# Patient Record
Sex: Male | Born: 1935 | Race: White | Hispanic: No | Marital: Single | Smoking: Never smoker
Health system: Southern US, Community
[De-identification: ages and names within clinical notes are randomized; demographics above are authoritative.]

## PROBLEM LIST (undated history)

## (undated) DIAGNOSIS — N289 Disorder of kidney and ureter, unspecified: Secondary | ICD-10-CM

## (undated) DIAGNOSIS — G4700 Insomnia, unspecified: Secondary | ICD-10-CM

## (undated) DIAGNOSIS — R1310 Dysphagia, unspecified: Secondary | ICD-10-CM

## (undated) DIAGNOSIS — H109 Unspecified conjunctivitis: Secondary | ICD-10-CM

## (undated) DIAGNOSIS — S12100A Unspecified displaced fracture of second cervical vertebra, initial encounter for closed fracture: Secondary | ICD-10-CM

## (undated) DIAGNOSIS — R092 Respiratory arrest: Secondary | ICD-10-CM

## (undated) DIAGNOSIS — I10 Essential (primary) hypertension: Secondary | ICD-10-CM

## (undated) DIAGNOSIS — K5900 Constipation, unspecified: Secondary | ICD-10-CM

## (undated) DIAGNOSIS — G8929 Other chronic pain: Secondary | ICD-10-CM

## (undated) DIAGNOSIS — L0231 Cutaneous abscess of buttock: Secondary | ICD-10-CM

## (undated) HISTORY — PX: TRACHEOSTOMY: SUR1362

## (undated) HISTORY — PX: GASTROSTOMY TUBE PLACEMENT: SHX655

---

## 2006-01-21 ENCOUNTER — Ambulatory Visit: Admit: 2006-01-21 | Disposition: A | Discharge status: Home / Self Care | Payer: Self-pay | Source: Ambulatory Visit

## 2009-01-19 ENCOUNTER — Ambulatory Visit: Payer: Self-pay | Admitting: Cardiology

## 2016-07-12 ENCOUNTER — Inpatient Hospital Stay (HOSPITAL_COMMUNITY)
Admission: AD | Admit: 2016-07-12 | Discharge: 2016-07-26 | DRG: 004 | Disposition: A | Discharge status: Other Acute Inpatient Hospital | Payer: Medicare Other | Source: Other Acute Inpatient Hospital | Attending: Internal Medicine | Admitting: Internal Medicine

## 2016-07-12 ENCOUNTER — Inpatient Hospital Stay (HOSPITAL_COMMUNITY): Payer: Medicare Other

## 2016-07-12 DIAGNOSIS — R0602 Shortness of breath: Secondary | ICD-10-CM

## 2016-07-12 DIAGNOSIS — T17998S Other foreign object in respiratory tract, part unspecified causing other injury, sequela: Secondary | ICD-10-CM | POA: Diagnosis not present

## 2016-07-12 DIAGNOSIS — J9601 Acute respiratory failure with hypoxia: Principal | ICD-10-CM

## 2016-07-12 DIAGNOSIS — T17990A Other foreign object in respiratory tract, part unspecified in causing asphyxiation, initial encounter: Secondary | ICD-10-CM | POA: Diagnosis not present

## 2016-07-12 DIAGNOSIS — W0110XA Fall on same level from slipping, tripping and stumbling with subsequent striking against unspecified object, initial encounter: Secondary | ICD-10-CM | POA: Diagnosis present

## 2016-07-12 DIAGNOSIS — Z930 Tracheostomy status: Secondary | ICD-10-CM | POA: Diagnosis not present

## 2016-07-12 DIAGNOSIS — Z7901 Long term (current) use of anticoagulants: Secondary | ICD-10-CM

## 2016-07-12 DIAGNOSIS — S12000A Unspecified displaced fracture of first cervical vertebra, initial encounter for closed fracture: Secondary | ICD-10-CM

## 2016-07-12 DIAGNOSIS — R739 Hyperglycemia, unspecified: Secondary | ICD-10-CM | POA: Diagnosis present

## 2016-07-12 DIAGNOSIS — E876 Hypokalemia: Secondary | ICD-10-CM | POA: Diagnosis not present

## 2016-07-12 DIAGNOSIS — J189 Pneumonia, unspecified organism: Secondary | ICD-10-CM

## 2016-07-12 DIAGNOSIS — Z86718 Personal history of other venous thrombosis and embolism: Secondary | ICD-10-CM

## 2016-07-12 DIAGNOSIS — T17908A Unspecified foreign body in respiratory tract, part unspecified causing other injury, initial encounter: Secondary | ICD-10-CM

## 2016-07-12 DIAGNOSIS — J9819 Other pulmonary collapse: Secondary | ICD-10-CM

## 2016-07-12 DIAGNOSIS — S14121A Central cord syndrome at C1 level of cervical spinal cord, initial encounter: Secondary | ICD-10-CM

## 2016-07-12 DIAGNOSIS — J9600 Acute respiratory failure, unspecified whether with hypoxia or hypercapnia: Secondary | ICD-10-CM | POA: Diagnosis not present

## 2016-07-12 DIAGNOSIS — S12500A Unspecified displaced fracture of sixth cervical vertebra, initial encounter for closed fracture: Secondary | ICD-10-CM

## 2016-07-12 DIAGNOSIS — T17998A Other foreign object in respiratory tract, part unspecified causing other injury, initial encounter: Secondary | ICD-10-CM

## 2016-07-12 DIAGNOSIS — IMO0002 Reserved for concepts with insufficient information to code with codable children: Secondary | ICD-10-CM

## 2016-07-12 DIAGNOSIS — G8250 Quadriplegia, unspecified: Secondary | ICD-10-CM | POA: Diagnosis not present

## 2016-07-12 DIAGNOSIS — Z4659 Encounter for fitting and adjustment of other gastrointestinal appliance and device: Secondary | ICD-10-CM

## 2016-07-12 DIAGNOSIS — E785 Hyperlipidemia, unspecified: Secondary | ICD-10-CM | POA: Diagnosis present

## 2016-07-12 DIAGNOSIS — G9340 Encephalopathy, unspecified: Secondary | ICD-10-CM | POA: Diagnosis not present

## 2016-07-12 DIAGNOSIS — I10 Essential (primary) hypertension: Secondary | ICD-10-CM | POA: Diagnosis present

## 2016-07-12 DIAGNOSIS — J690 Pneumonitis due to inhalation of food and vomit: Secondary | ICD-10-CM | POA: Diagnosis not present

## 2016-07-12 DIAGNOSIS — T17908S Unspecified foreign body in respiratory tract, part unspecified causing other injury, sequela: Secondary | ICD-10-CM

## 2016-07-12 DIAGNOSIS — J9811 Atelectasis: Secondary | ICD-10-CM

## 2016-07-12 DIAGNOSIS — J9690 Respiratory failure, unspecified, unspecified whether with hypoxia or hypercapnia: Secondary | ICD-10-CM

## 2016-07-12 DIAGNOSIS — I481 Persistent atrial fibrillation: Secondary | ICD-10-CM | POA: Diagnosis not present

## 2016-07-12 LAB — MRSA PCR SCREENING: MRSA by PCR: NEGATIVE

## 2016-07-12 LAB — POCT I-STAT 3, ART BLOOD GAS (G3+)
ACID-BASE EXCESS: 12 mmol/L — AB (ref 0.0–2.0)
BICARBONATE: 36.1 meq/L — AB (ref 20.0–24.0)
O2 Saturation: 99 %
PH ART: 7.529 — AB (ref 7.350–7.450)
TCO2: 37 mmol/L (ref 0–100)
pCO2 arterial: 43.3 mmHg (ref 35.0–45.0)
pO2, Arterial: 104 mmHg — ABNORMAL HIGH (ref 80.0–100.0)

## 2016-07-12 LAB — COMPREHENSIVE METABOLIC PANEL
ALBUMIN: 2.6 g/dL — AB (ref 3.5–5.0)
ALK PHOS: 46 U/L (ref 38–126)
ALT: 14 U/L — AB (ref 17–63)
AST: 15 U/L (ref 15–41)
Anion gap: 8 (ref 5–15)
BILIRUBIN TOTAL: 0.6 mg/dL (ref 0.3–1.2)
BUN: 17 mg/dL (ref 6–20)
CO2: 33 mmol/L — ABNORMAL HIGH (ref 22–32)
CREATININE: 0.96 mg/dL (ref 0.61–1.24)
Calcium: 8.7 mg/dL — ABNORMAL LOW (ref 8.9–10.3)
Chloride: 98 mmol/L — ABNORMAL LOW (ref 101–111)
GFR calc Af Amer: >60 mL/min (ref >60)
GLUCOSE: 114 mg/dL — AB (ref 65–99)
Potassium: 3.7 mmol/L (ref 3.5–5.1)
Sodium: 139 mmol/L (ref 135–145)
TOTAL PROTEIN: 5.6 g/dL — AB (ref 6.5–8.1)

## 2016-07-12 LAB — URINALYSIS, ROUTINE W REFLEX MICROSCOPIC
Glucose, UA: NEGATIVE mg/dL
KETONES UR: 15 mg/dL — AB
NITRITE: POSITIVE — AB
PH: 5.5 (ref 5.0–8.0)
Protein, ur: 30 mg/dL — AB
Specific Gravity, Urine: 1.029 (ref 1.005–1.030)

## 2016-07-12 LAB — PROTIME-INR
INR: 2.55
PROTHROMBIN TIME: 27.9 s — AB (ref 11.4–15.2)

## 2016-07-12 LAB — CBC
HEMATOCRIT: 42 % (ref 39.0–52.0)
HEMOGLOBIN: 13.2 g/dL (ref 13.0–17.0)
MCH: 29.9 pg (ref 26.0–34.0)
MCHC: 31.4 g/dL (ref 30.0–36.0)
MCV: 95 fL (ref 78.0–100.0)
Platelets: 293 10*3/uL (ref 150–400)
RBC: 4.42 MIL/uL (ref 4.22–5.81)
RDW: 13.2 % (ref 11.5–15.5)
WBC: 16.9 10*3/uL — ABNORMAL HIGH (ref 4.0–10.5)

## 2016-07-12 LAB — LACTIC ACID, PLASMA: Lactic Acid, Venous: 1.2 mmol/L (ref 0.5–1.9)

## 2016-07-12 LAB — TROPONIN I: Troponin I: 0.06 ng/mL (ref <0.03)

## 2016-07-12 LAB — URINE MICROSCOPIC-ADD ON

## 2016-07-12 LAB — PROCALCITONIN: Procalcitonin: 0.15 ng/mL

## 2016-07-12 LAB — PHOSPHORUS: Phosphorus: <1 mg/dL — CL (ref 2.5–4.6)

## 2016-07-12 LAB — MAGNESIUM: MAGNESIUM: 1.9 mg/dL (ref 1.7–2.4)

## 2016-07-12 LAB — TRIGLYCERIDES: TRIGLYCERIDES: 58 mg/dL (ref <150)

## 2016-07-12 MED ORDER — ALBUTEROL SULFATE (2.5 MG/3ML) 0.083% IN NEBU
2.5000 mg | INHALATION_SOLUTION | RESPIRATORY_TRACT | Status: DC
  Administered 2016-07-12: 2.5 mg via RESPIRATORY_TRACT
  Filled 2016-07-12: qty 3

## 2016-07-12 MED ORDER — PROPOFOL 1000 MG/100ML IV EMUL
INTRAVENOUS | Status: AC
  Filled 2016-07-12: qty 100

## 2016-07-12 MED ORDER — HEPARIN SODIUM (PORCINE) 5000 UNIT/ML IJ SOLN
5000.0000 [IU] | Freq: Three times a day (TID) | INTRAMUSCULAR | Status: DC
  Administered 2016-07-12: 5000 [IU] via SUBCUTANEOUS
  Filled 2016-07-12: qty 1

## 2016-07-12 MED ORDER — POTASSIUM PHOSPHATES 15 MMOLE/5ML IV SOLN
20.0000 meq | Freq: Once | INTRAVENOUS | Status: AC
  Administered 2016-07-12: 20 meq via INTRAVENOUS
  Filled 2016-07-12: qty 4.55

## 2016-07-12 MED ORDER — SODIUM CHLORIDE 0.9 % IV SOLN
3.0000 g | Freq: Four times a day (QID) | INTRAVENOUS | Status: AC
  Administered 2016-07-12 – 2016-07-19 (×27): 3 g via INTRAVENOUS
  Filled 2016-07-12 (×29): qty 3

## 2016-07-12 MED ORDER — ACETYLCYSTEINE 20 % IN SOLN
4.0000 mL | RESPIRATORY_TRACT | Status: DC
  Administered 2016-07-12: 4 mL via RESPIRATORY_TRACT
  Filled 2016-07-12: qty 4

## 2016-07-12 MED ORDER — SODIUM CHLORIDE 0.9 % IV SOLN
INTRAVENOUS | Status: DC
  Administered 2016-07-12: 18:00:00 via INTRAVENOUS
  Administered 2016-07-15 – 2016-07-22 (×3): 10 mL/h via INTRAVENOUS

## 2016-07-12 MED ORDER — FENTANYL CITRATE (PF) 100 MCG/2ML IJ SOLN
50.0000 ug | INTRAMUSCULAR | Status: AC | PRN
  Administered 2016-07-19 (×5): 50 ug via INTRAVENOUS
  Filled 2016-07-12 (×3): qty 2

## 2016-07-12 MED ORDER — ATORVASTATIN CALCIUM 10 MG PO TABS
10.0000 mg | ORAL_TABLET | Freq: Every day | ORAL | Status: DC
Start: 2016-07-13 — End: 2016-07-26
  Administered 2016-07-13 – 2016-07-25 (×14): 10 mg
  Filled 2016-07-12 (×13): qty 1

## 2016-07-12 MED ORDER — HEPARIN SODIUM (PORCINE) 5000 UNIT/ML IJ SOLN
5000.0000 [IU] | Freq: Three times a day (TID) | INTRAMUSCULAR | Status: DC
  Administered 2016-07-12 – 2016-07-15 (×9): 5000 [IU] via SUBCUTANEOUS
  Filled 2016-07-12 (×9): qty 1

## 2016-07-12 MED ORDER — PROPOFOL 1000 MG/100ML IV EMUL
0.0000 ug/kg/min | INTRAVENOUS | Status: DC
  Administered 2016-07-12 – 2016-07-13 (×4): 15 ug/kg/min via INTRAVENOUS
  Administered 2016-07-13 – 2016-07-14 (×3): 30 ug/kg/min via INTRAVENOUS
  Administered 2016-07-14 (×2): 25 ug/kg/min via INTRAVENOUS
  Administered 2016-07-14: 20 ug/kg/min via INTRAVENOUS
  Administered 2016-07-15 – 2016-07-16 (×5): 25 ug/kg/min via INTRAVENOUS
  Administered 2016-07-16 – 2016-07-17 (×2): 30 ug/kg/min via INTRAVENOUS
  Administered 2016-07-17: 10 ug/kg/min via INTRAVENOUS
  Administered 2016-07-17 – 2016-07-18 (×3): 30 ug/kg/min via INTRAVENOUS
  Administered 2016-07-18: 25 ug/kg/min via INTRAVENOUS
  Administered 2016-07-18 – 2016-07-19 (×3): 30 ug/kg/min via INTRAVENOUS
  Administered 2016-07-19 – 2016-07-20 (×3): 25 ug/kg/min via INTRAVENOUS
  Administered 2016-07-21 (×2): 15 ug/kg/min via INTRAVENOUS
  Filled 2016-07-12 (×34): qty 100

## 2016-07-12 MED ORDER — SODIUM CHLORIDE 0.9 % IV SOLN
250.0000 mL | INTRAVENOUS | Status: DC | PRN
  Administered 2016-07-12 – 2016-07-17 (×2): 250 mL via INTRAVENOUS

## 2016-07-12 MED ORDER — FENTANYL CITRATE (PF) 100 MCG/2ML IJ SOLN
50.0000 ug | INTRAMUSCULAR | Status: DC | PRN
  Administered 2016-07-13 – 2016-07-26 (×30): 50 ug via INTRAVENOUS
  Filled 2016-07-12 (×27): qty 2

## 2016-07-12 MED ORDER — ALBUTEROL SULFATE (2.5 MG/3ML) 0.083% IN NEBU
2.5000 mg | INHALATION_SOLUTION | Freq: Four times a day (QID) | RESPIRATORY_TRACT | Status: DC
  Administered 2016-07-13 – 2016-07-26 (×54): 2.5 mg via RESPIRATORY_TRACT
  Filled 2016-07-12 (×55): qty 3

## 2016-07-12 MED ORDER — PANTOPRAZOLE SODIUM 40 MG PO PACK
40.0000 mg | PACK | Freq: Every day | ORAL | Status: DC
  Administered 2016-07-12 – 2016-07-22 (×10): 40 mg
  Filled 2016-07-12 (×12): qty 20

## 2016-07-12 MED ORDER — ASPIRIN 81 MG PO CHEW
81.0000 mg | CHEWABLE_TABLET | Freq: Every day | ORAL | Status: DC
  Administered 2016-07-12: 81 mg via ORAL
  Filled 2016-07-12: qty 1

## 2016-07-12 MED ORDER — CHLORHEXIDINE GLUCONATE 0.12% ORAL RINSE (MEDLINE KIT)
15.0000 mL | Freq: Two times a day (BID) | OROMUCOSAL | Status: DC
  Administered 2016-07-12 – 2016-07-17 (×10): 15 mL via OROMUCOSAL

## 2016-07-12 MED ORDER — POTASSIUM & SODIUM PHOSPHATES 280-160-250 MG PO PACK
1.0000 | PACK | Freq: Three times a day (TID) | ORAL | Status: AC
  Administered 2016-07-12 – 2016-07-14 (×6): 1 via ORAL
  Filled 2016-07-12 (×6): qty 1

## 2016-07-12 MED ORDER — ANTISEPTIC ORAL RINSE SOLUTION (CORINZ)
7.0000 mL | Freq: Four times a day (QID) | OROMUCOSAL | Status: DC
  Administered 2016-07-12 – 2016-07-17 (×19): 7 mL via OROMUCOSAL

## 2016-07-12 NOTE — Progress Notes (Signed)
ANTICOAGULATION CONSULT NOTE - Initial Consult  Pharmacy Consult for heparin while coumadin on hold Indication: History of DVT  Allergies  Allergen Reactions  . Hydromorphone     Respiratory depression    Patient Measurements: Height: 5\' 10"  (177.8 cm) Weight: 254 lb 10.1 oz (115.5 kg) IBW/kg (Calculated) : 73 Heparin Dosing Weight:   Vital Signs: BP: 124/71 (08/03 1800) Pulse Rate: 71 (08/03 1800)  Labs: No results for input(s): HGB, HCT, PLT, APTT, LABPROT, INR, HEPARINUNFRC, HEPRLOWMOCWT, CREATININE, CKTOTAL, CKMB, TROPONINI in the last 72 hours.  CrCl cannot be calculated (No order found.).   Medical History: No past medical history on file.   Assessment: 82 y.o. male with PMH of HTN, HLD, DVT on chronic coumadin. Patient suffered mechanical fall on 8/1 and sustained C1 and C6 fx's. Intubated at OSH. New orders received to use IV heparin for DVT prevention while chronic coumadin is on hold.  INR 2.5 this evening, will hold off on heparin at this time and start once INR is below 2.  Goal of Therapy:  INR 2-3 Heparin level 0.3-0.7 units/ml Monitor platelets by anticoagulation protocol: Yes   Plan:  Start heparin once INR<2  Sheppard Coil PharmD., BCPS Clinical Pharmacist Pager [phone] 07/12/2016 6:25 PM

## 2016-07-12 NOTE — Progress Notes (Signed)
eLink Physician-Brief Progress Note Patient Name: Allen Hamilton DOB: 1933-06-10 MRN: [id number]   Date of Service  07/12/2016  HPI/Events of Note  Troponin elevated - likely demand ischemia  Phos low, K 3.7  eICU Interventions  K phos + sodium phos packet  Asa 12 lead Cycle troponins     Intervention Category Major Interventions: Electrolyte abnormality - evaluation and management Intermediate Interventions: Diagnostic test evaluation  Max Fickle 07/12/2016, 8:49 PM

## 2016-07-12 NOTE — Progress Notes (Signed)
Pharmacy Antibiotic Note  Allen Hamilton is a 82 y.o. male admitted on 07/12/2016 with aspiration pneumonia.  Pharmacy has been consulted for Unasyn dosing.  Plan: Unasyn 3g IV q6 F/U baseline labs & adjust dosing if necessary  Height: 5\' 10"  (177.8 cm) IBW/kg (Calculated) : 73  No data recorded.  No results for input(s): WBC, CREATININE, LATICACIDVEN, VANCOTROUGH, VANCOPEAK, VANCORANDOM, GENTTROUGH, GENTPEAK, GENTRANDOM, TOBRATROUGH, TOBRAPEAK, TOBRARND, AMIKACINPEAK, AMIKACINTROU, AMIKACIN in the last 168 hours.  CrCl cannot be calculated (Unknown ideal weight.).    Allergies not on file  Antimicrobials this admission: Unasyn 8/3 >>   Microbiology results: 8/3 BCx:    Thank you for allowing pharmacy to be a part of this patient's care.  Marisue Humble, PharmD Clinical Pharmacist [facility]

## 2016-07-12 NOTE — Progress Notes (Signed)
CPT held, no secretions noted, pt breath sounds are clear with good aeration

## 2016-07-12 NOTE — Progress Notes (Signed)
Inline suctioned no secretions noted.

## 2016-07-12 NOTE — Consult Note (Signed)
CT imaging reviewed Jefferson type fracture involving both lateral masses of C1, joints intact No role for surgery Aspen collar Follow up in 6 weeks

## 2016-07-12 NOTE — H&P (Signed)
PULMONARY / CRITICAL CARE MEDICINE   Name: Allen Hamilton MRN: [id number] DOB: 1933/07/26    ADMISSION DATE:  07/12/2016 CONSULTATION DATE:  07/12/16  REFERRING MD:  [facility]  CHIEF COMPLAINT:  C1 fracture s/p fall  HISTORY OF PRESENT ILLNESS:  Pt is encephelopathic; therefore, this HPI is obtained from chart review. Allen Hamilton is a 82 y.o. male with PMH of HTN, HLD, DVT on chronic coumadin.  He lives in [place] and on 8/1, was checking on some houses that he was apparently having built and unfortunately suffered a mechanical fall after tripping over a hole causing him to strike his head. On 8/2, he went to [facility] where CT scan revealed a displaced C1 fx involving both arches.  He was placed in a brace and was given pain meds for pain management. After receiving pain meds, he had desaturations and developed respiratory insufficiency.  He was started on BiPAP but later complained of difficulty swallowing.  Due to concern for airway compromise, he was subsequently intubated.  He was then transferred to [facility] ICU on 8/3 for further evaluation and management.  PAST MEDICAL HISTORY :  He  has no past medical history on file.  PAST SURGICAL HISTORY: He  has no past surgical history on file.  Allergies  Allergen Reactions  . Hydromorphone     Respiratory depression    No current facility-administered medications on file prior to encounter.    No current outpatient prescriptions on file prior to encounter.    FAMILY HISTORY:  His has no family status information on file.    SOCIAL HISTORY: He    REVIEW OF SYSTEMS:  Unable to obtain as pt is encephalopathic.  SUBJECTIVE:  On vent, opens eyes but does not follow commands.  VITAL SIGNS: BP 130/67 (BP Location: Right Arm)   Pulse 72   Resp 13   Ht 5\' 10"  (1.778 m)   Wt 254 lb 10.1 oz (115.5 kg)   SpO2 99%   BMI 36.54 kg/m   HEMODYNAMICS:    VENTILATOR SETTINGS: Vent Mode: PRVC FiO2 (%):  [50 %] 50  % Set Rate:  [14 bmp] 14 bmp Vt Set:  [550 mL-580 mL] 580 mL PEEP:  [5 cmH20-10 cmH20] 10 cmH20 Plateau Pressure:  [25 cmH20] 25 cmH20  INTAKE / OUTPUT: No intake/output data recorded.   PHYSICAL EXAMINATION: General: Elderly male, in NAD. Neuro: Sedated, opens eyes but does not follow commands. HEENT: /AT. PERRL, sclerae anicteric. Cervical collar in place. Cardiovascular: RRR, no M/R/G.  Lungs: Respirations even and unlabored.  CTA bilaterally, diminished in bases. Abdomen: BS x 4, soft, NT/ND.  Musculoskeletal: No gross deformities, no edema.  Skin: Intact, warm, no rashes.  Green - white discharge around foley insertion site per RN.  LABS:  BMET No results for input(s): NA, K, CL, CO2, BUN, CREATININE, GLUCOSE in the last 168 hours.  Electrolytes No results for input(s): CALCIUM, MG, PHOS in the last 168 hours.  CBC No results for input(s): WBC, HGB, HCT, PLT in the last 168 hours.  Coag's No results for input(s): APTT, INR in the last 168 hours.  Sepsis Markers No results for input(s): LATICACIDVEN, PROCALCITON, O2SATVEN in the last 168 hours.  ABG No results for input(s): PHART, PCO2ART, PO2ART in the last 168 hours.  Liver Enzymes No results for input(s): AST, ALT, ALKPHOS, BILITOT, ALBUMIN in the last 168 hours.  Cardiac Enzymes No results for input(s): TROPONINI, PROBNP in the last 168 hours.  Glucose No  results for input(s): GLUCAP in the last 168 hours.  Imaging No results found.   STUDIES:  CT head 8/2 > displaced fx of C1 along with fx of C6. CXR 8/3 >  CULTURES: Sputum 8/3 > Gonorrhea 8/3 > Chlamydia 8/3 >  ANTIBIOTICS: Unasyn 8/3 >  SIGNIFICANT EVENTS: 8/2 > admitted at [facility] with C1 and C6 fx, intubated for resp insufficiency. 8/3 > transferred to [facility] ICU.  LINES/TUBES: ETT 8/3 >  DISCUSSION: 82 y.o. M who suffered mechanical fall 8/1 and sustained C1 and C6 fx's.  He required intubation due to concern for airway compromise  8/3 and was later transferred to [facility] for further evaluation and management.  ASSESSMENT / PLAN:  NEUROLOGIC A:   Acute encephalopathy. Displaced C1 and C6 fx's - s/p mechanical fall. P:   Sedation:  Propofol gtt / fentanyl PRN. RASS goal: 0 to -1. Daily WUA. Seen by neurosurgery, no role for surgery.  Continue C collar and follow up in 6 weeks.  PULMONARY A: VDRF - due to concern for airway compromise in the setting of C1 and C6 fx's. Atelectasis. P:   Full vent support. Wean as able. VAP prevention measures. SBT in AM if able - caution given displaced cervical fx's. Mucomyst x 48 hrs. CXR in AM.  CARDIOVASCULAR A:  Hx HTN, HLD, DVT on coumadin. P:  Monitor hemodynamics. Heparin gtt in lieu of outpatient coumadin. Continue outpatient atorvastatin. Hold outpatient lisinopril, niacin, coumadin.  RENAL A:   No acute issues. P:   NS @ 100. BMP in AM.  GASTROINTESTINAL A:   GI prophylaxis. Nutrition. P:   SUP: Pantoprazole. NPO. Start TF's if unable to extubate in AM.  HEMATOLOGIC A:   On chronic coumadin for chronic DVT. VTE Prophylaxis. P:  Hold all anticoayulation with risk spinal cord injury associated CBC in AM.  INFECTIOUS A:   Concern for aspiration PNA. Green / white discharge from around foley insertion site. P:   Abx as above (unasyn).  Follow cultures as above - add gonorrhea, chlamydia, HIV.  ENDOCRINE A:   No acute issues.   P:   Monitor glucose on BMP.  Family updated: None available.  Interdisciplinary Family Meeting v Palliative Care Meeting:  Due by: 07/19/16  CC time: 30 minutes.   Rutherford Guys, [place] [facility] Pager: [phone]  or [phone] 07/12/2016, 6:05 PM   STAFF NOTE: I, Rory Percy, MD FACP have personally reviewed patient's available data, including medical history, events of note, physical examination and test results as part of my evaluation. I have discussed  with resident/NP and other care providers such as pharmacist, RN and RRT. In addition, I personally evaluated patient and elicited key findings of: sedated, collar on, rt lung ronchi but has BS rt, CT reviewed with fx, pcxr I reviewed from disc also showing ATX rll, rml collapse before ETT and pos pressure used, clinical circumstance of neck injury and associated poor resp effort and ATX from mucous plugging, add peep 10, mucomysts, unasyn, chest pt, stat pcxr, stat abg on current settings, NS notified of arrival, will consult, keep collar on strict, keep NPO for now, I looked for family in waiting room , unable to find, pcxr in am , get labs now, hydrate after rhabdo risk and fall, would NOT use systemic anticoagulation with concern spinal injury associated, repeat coags in am  The patient is critically ill with multiple organ systems failure and requires  high complexity decision making for assessment and support, frequent evaluation and titration of therapies, application of advanced monitoring technologies and extensive interpretation of multiple databases.   Critical Care Time devoted to patient care services described in this note is 40 Minutes. This time reflects time of care of this signee: Rory Percy, MD FACP. This critical care time does not reflect procedure time, or teaching time or supervisory time of PA/NP/Med student/Med Resident etc but could involve care discussion time. Rest per NP/medical resident whose note is outlined above and that I agree with   Mcarthur Rossetti. Tyson Alias, MD, FACP Pgr: [phone] [place] [facility] 07/12/2016 9:43 PM

## 2016-07-12 NOTE — Progress Notes (Signed)
 versed from [facility] hospital WIS with Aline August RN

## 2016-07-13 ENCOUNTER — Inpatient Hospital Stay (HOSPITAL_COMMUNITY): Payer: Medicare Other

## 2016-07-13 DIAGNOSIS — J9601 Acute respiratory failure with hypoxia: Secondary | ICD-10-CM | POA: Diagnosis present

## 2016-07-13 DIAGNOSIS — J9600 Acute respiratory failure, unspecified whether with hypoxia or hypercapnia: Secondary | ICD-10-CM

## 2016-07-13 LAB — TROPONIN I

## 2016-07-13 LAB — POCT I-STAT 3, ART BLOOD GAS (G3+)
ACID-BASE EXCESS: 11 mmol/L — AB (ref 0.0–2.0)
BICARBONATE: 35.2 meq/L — AB (ref 20.0–24.0)
O2 SAT: 97 %
TCO2: 37 mmol/L (ref 0–100)
pCO2 arterial: 46.1 mmHg — ABNORMAL HIGH (ref 35.0–45.0)
pH, Arterial: 7.492 — ABNORMAL HIGH (ref 7.350–7.450)
pO2, Arterial: 86 mmHg (ref 80.0–100.0)

## 2016-07-13 LAB — PHOSPHORUS
PHOSPHORUS: 2.6 mg/dL (ref 2.5–4.6)
Phosphorus: 3.1 mg/dL (ref 2.5–4.6)

## 2016-07-13 LAB — BASIC METABOLIC PANEL
ANION GAP: 8 (ref 5–15)
BUN: 18 mg/dL (ref 6–20)
CALCIUM: 8.3 mg/dL — AB (ref 8.9–10.3)
CO2: 32 mmol/L (ref 22–32)
Chloride: 101 mmol/L (ref 101–111)
Creatinine, Ser: 0.94 mg/dL (ref 0.61–1.24)
Glucose, Bld: 115 mg/dL — ABNORMAL HIGH (ref 65–99)
POTASSIUM: 3.7 mmol/L (ref 3.5–5.1)
Sodium: 141 mmol/L (ref 135–145)

## 2016-07-13 LAB — CBC
HCT: 40.3 % (ref 39.0–52.0)
Hemoglobin: 12.9 g/dL — ABNORMAL LOW (ref 13.0–17.0)
MCH: 30.2 pg (ref 26.0–34.0)
MCHC: 32 g/dL (ref 30.0–36.0)
MCV: 94.4 fL (ref 78.0–100.0)
PLATELETS: 276 10*3/uL (ref 150–400)
RBC: 4.27 MIL/uL (ref 4.22–5.81)
RDW: 13.6 % (ref 11.5–15.5)
WBC: 14.4 10*3/uL — AB (ref 4.0–10.5)

## 2016-07-13 LAB — PROTIME-INR
INR: 1.97
Prothrombin Time: 22.7 seconds — ABNORMAL HIGH (ref 11.4–15.2)

## 2016-07-13 LAB — HIV ANTIBODY (ROUTINE TESTING W REFLEX): HIV SCREEN 4TH GENERATION: NONREACTIVE

## 2016-07-13 LAB — GLUCOSE, CAPILLARY
GLUCOSE-CAPILLARY: 101 mg/dL — AB (ref 65–99)
GLUCOSE-CAPILLARY: 108 mg/dL — AB (ref 65–99)
Glucose-Capillary: 98 mg/dL (ref 65–99)

## 2016-07-13 LAB — MAGNESIUM
MAGNESIUM: 1.8 mg/dL (ref 1.7–2.4)
Magnesium: 1.8 mg/dL (ref 1.7–2.4)

## 2016-07-13 MED ORDER — PRO-STAT SUGAR FREE PO LIQD
60.0000 mL | Freq: Three times a day (TID) | ORAL | Status: DC
  Administered 2016-07-13 – 2016-07-24 (×31): 60 mL
  Filled 2016-07-13 (×32): qty 60

## 2016-07-13 MED ORDER — ACETYLCYSTEINE 10 % IN SOLN
4.0000 mL | Freq: Two times a day (BID) | RESPIRATORY_TRACT | Status: DC
  Filled 2016-07-13: qty 4

## 2016-07-13 MED ORDER — PRO-STAT SUGAR FREE PO LIQD
30.0000 mL | Freq: Two times a day (BID) | ORAL | Status: DC
  Administered 2016-07-13: 30 mL
  Filled 2016-07-13: qty 30

## 2016-07-13 MED ORDER — VITAL HIGH PROTEIN PO LIQD
1000.0000 mL | ORAL | Status: DC
  Administered 2016-07-13: 1000 mL
  Administered 2016-07-14: 20:00:00
  Administered 2016-07-14: 1000 mL
  Administered 2016-07-15 (×2)
  Administered 2016-07-15: 1000 mL
  Administered 2016-07-16: 12:00:00
  Administered 2016-07-16: 1000 mL
  Administered 2016-07-16 – 2016-07-17 (×2)
  Administered 2016-07-17: 1000 mL
  Administered 2016-07-18
  Administered 2016-07-18: 1000 mL
  Administered 2016-07-19
  Administered 2016-07-19 (×2): 1000 mL
  Administered 2016-07-20 (×2)
  Administered 2016-07-20: 1000 mL
  Administered 2016-07-20 (×2)
  Administered 2016-07-21 – 2016-07-23 (×3): 1000 mL
  Administered 2016-07-23: 09:00:00
  Administered 2016-07-23: 1000 mL
  Administered 2016-07-24 (×6)
  Administered 2016-07-24: 1000 mL
  Administered 2016-07-24: 11:00:00

## 2016-07-13 MED ORDER — ACETYLCYSTEINE 20 % IN SOLN
4.0000 mL | Freq: Two times a day (BID) | RESPIRATORY_TRACT | Status: AC
  Administered 2016-07-13 (×2): 4 mL via RESPIRATORY_TRACT
  Filled 2016-07-13 (×2): qty 4

## 2016-07-13 MED ORDER — VITAL HIGH PROTEIN PO LIQD
1000.0000 mL | ORAL | Status: DC
  Administered 2016-07-13: 1000 mL

## 2016-07-13 MED ORDER — INSULIN ASPART 100 UNIT/ML ~~LOC~~ SOLN
0.0000 [IU] | SUBCUTANEOUS | Status: DC
  Administered 2016-07-19 (×2): 1 [IU] via SUBCUTANEOUS
  Administered 2016-07-21: 2 [IU] via SUBCUTANEOUS
  Administered 2016-07-21: 1 [IU] via SUBCUTANEOUS
  Administered 2016-07-21: 2 [IU] via SUBCUTANEOUS
  Administered 2016-07-21 – 2016-07-23 (×5): 1 [IU] via SUBCUTANEOUS
  Administered 2016-07-23: 2 [IU] via SUBCUTANEOUS
  Administered 2016-07-23 – 2016-07-25 (×6): 1 [IU] via SUBCUTANEOUS
  Administered 2016-07-26: 2 [IU] via SUBCUTANEOUS
  Administered 2016-07-26: 1 [IU] via SUBCUTANEOUS

## 2016-07-13 NOTE — Progress Notes (Signed)
Initial Nutrition Assessment  DOCUMENTATION CODES:   Obesity unspecified  INTERVENTION:    Initiate TF via OGT with Vital High Protein at goal rate of 30 ml/h (720 ml per day) and Prostat 60 ml TID to provide 1320 kcals, 153 gm protein, 602 ml free water daily.  Total calorie intake with TF + Propofol will be 1595 kcal per day (100% estimated needs).  NUTRITION DIAGNOSIS:   Inadequate oral intake related to inability to eat as evidenced by NPO status.  GOAL:   Provide needs based on ASPEN/SCCM guidelines  MONITOR:   Vent status, Labs, Weight trends, TF tolerance, Skin, I & O's  REASON FOR ASSESSMENT:   Consult Enteral/tube feeding initiation and management  ASSESSMENT:   82 y.o. M who suffered mechanical fall 8/1 and sustained C1 and C6 fx's.  He required intubation due to concern for airway compromise 8/3 and was later transferred to [facility] for further evaluation and management.  Labs reviewed. Medications reviewed and include Novolog, potassium & sodium phosphates Nutrition-Focused physical exam completed. Findings are no fat depletion, no muscle depletion, and no edema.  Patient is currently intubated on ventilator support Temp (24hrs), Avg:98.9 F (37.2 C), Min:98.4 F (36.9 C), Max:99.1 F (37.3 C)  Propofol: 10.4 ml/hr providing 275 kcal Received MD Consult for TF initiation and management.  Diet Order:  Diet NPO time specified  Skin:  Reviewed, no issues  Last BM:  PTA  Height:   Ht Readings from Last 1 Encounters:  07/12/16 5\' 10"  (1.778 m)    Weight:   Wt Readings from Last 1 Encounters:  07/13/16 251 lb 12.3 oz (114.2 kg)    Ideal Body Weight:  75.5 kg  BMI:  Body mass index is 36.12 kg/m.  Estimated Nutritional Needs:   Kcal:  3893-7342  Protein:  >150 gm  Fluid:  2 L  EDUCATION NEEDS:   No education needs identified at this time  Joaquin Courts, RD, LDN, CNSC Pager [phone] After Hours Pager [phone]

## 2016-07-13 NOTE — Progress Notes (Signed)
ABG collected  

## 2016-07-13 NOTE — Progress Notes (Signed)
Pharmacy Antibiotic Note  Allen Hamilton is a 82 y.o. male admitted on 07/12/2016 with aspiration pneumonia.  Pharmacy has been consulted for Unasyn dosing. Scr 0.94. CrCl 79 ok.  Plan: Unasyn 3g IV q6 Pharmacy will sign off. Please reconsult for further dosing assitance.   Height: 5\' 10"  (177.8 cm) Weight: 251 lb 12.3 oz (114.2 kg) IBW/kg (Calculated) : 73  Temp (24hrs), Avg:98.9 F (37.2 C), Min:98.4 F (36.9 C), Max:99.1 F (37.3 C)   Recent Labs Lab 07/12/16 1841 07/12/16 1854 07/13/16 0725  WBC 16.9*  --  14.4*  CREATININE 0.96  --  0.94  LATICACIDVEN  --  1.2  --     Estimated Creatinine Clearance: 79.3 mL/min (by C-G formula based on SCr of 0.94 mg/dL).    Allergies  Allergen Reactions  . Hydromorphone     Respiratory depression    Antimicrobials this admission: Unasyn 8/3 >>   Microbiology results: 8/3 BCx:  8/3: Nasal MRSA PCR: negative 8/3: Gonoccus>> 8/3: Trach aspirate: GPC, GNR 8/3 BC x 2>>   Hall Birchard S. Merilynn Finland, PharmD, BCPS Clinical Staff Pharmacist Pager [phone]

## 2016-07-13 NOTE — Progress Notes (Signed)
RN called d/t pt w/ increased RR and increased WOB.  RN gave bolus of Propofol, pt switched back to full vent support currently.  NO distress currently noted, RN aware.

## 2016-07-13 NOTE — Progress Notes (Signed)
CPT at this time tolerating it well.

## 2016-07-13 NOTE — Progress Notes (Signed)
CPT held, pt is sleeping comfortably at this time no distress or complications noted

## 2016-07-13 NOTE — Progress Notes (Signed)
Scant amount of Clear secretions noted

## 2016-07-13 NOTE — H&P (Signed)
PULMONARY / CRITICAL CARE MEDICINE   Name: Allen Hamilton MRN: [id number] DOB: 12/17/1932    ADMISSION DATE:  07/12/2016 CONSULTATION DATE:  07/12/16  REFERRING MD:  [facility]  CHIEF COMPLAINT:  C1 fracture s/p fall  HISTORY OF PRESENT ILLNESS:  Pt is encephelopathic; therefore, this HPI is obtained from chart review. Allen Hamilton is a 82 y.o. male with PMH of HTN, HLD, DVT on chronic coumadin.  He lives in [place] and on 8/1, was checking on some houses that he was apparently having built and unfortunately suffered a mechanical fall after tripping over a hole causing him to strike his head. On 8/2, he went to [facility] where CT scan revealed a displaced C1 fx involving both arches.  He was placed in a brace and was given pain meds for pain management. After receiving pain meds, he had desaturations and developed respiratory insufficiency.  He was started on BiPAP but later complained of difficulty swallowing.  Due to concern for airway compromise, he was subsequently intubated.  He was then transferred to [facility] ICU on 8/3 for further evaluation and management.  SUBJECTIVE:  NS evaluation, no role surgery Peep remains 10  VITAL SIGNS: BP (!) 122/59   Pulse 79   Temp 99.1 F (37.3 C) (Oral)   Resp 14   Ht  (1.778 m)   Wt 114.2 kg (251 lb 12.3 oz)   SpO2 100%   BMI 36.12 kg/m   HEMODYNAMICS:    VENTILATOR SETTINGS: Vent Mode: PRVC FiO2 (%):  [40 %-50 %] 40 % Set Rate:  [14 bmp] 14 bmp Vt Set:  [510 mL-580 mL] 510 mL PEEP:  [5 cmH20-10 cmH20] 10 cmH20 Plateau Pressure:  [25 cmH20-26 cmH20] 25 cmH20  INTAKE / OUTPUT: I/O last 3 completed shifts: In: 1909.4 [I.V.:1394.8; NG/GT:60; IV Piggyback:454.6] Out: 200 [Urine:200]   PHYSICAL EXAMINATION: General: Elderly male, in NAD. Neuro: Sedated, opens eyes, follows commands, moves all ext HEENT: [place]/AT. PERRL, Cervical collar in place. Cardiovascular: RRR, no M/R/G.  Lungs: Improved ronchi Abdomen: BS  x 4, soft, NT/ND.  Musculoskeletal: No gross deformities, no edema.  Skin: Intact, warm, no rashes  LABS:  BMET  Recent Labs Lab 07/12/16 1841  NA 139  K 3.7  CL 98*  CO2 33*  BUN 17  CREATININE 0.96  GLUCOSE 114*    Electrolytes  Recent Labs Lab 07/12/16 1841  CALCIUM 8.7*  MG 1.9  PHOS <1.0*    CBC  Recent Labs Lab 07/12/16 1841  WBC 16.9*  HGB 13.2  HCT 42.0  PLT 293    Coag's  Recent Labs Lab 07/12/16 1850  INR 2.55    Sepsis Markers  Recent Labs Lab 07/12/16 1841 07/12/16 1854  LATICACIDVEN  --  1.2  PROCALCITON 0.15  --     ABG  Recent Labs Lab 07/12/16 1828 07/13/16 0326  PHART 7.529* 7.492*  PCO2ART 43.3 46.1*  PO2ART 104.0* 86.0    Liver Enzymes  Recent Labs Lab 07/12/16 1841  AST 15  ALT 14*  ALKPHOS 46  BILITOT 0.6  ALBUMIN 2.6*    Cardiac Enzymes  Recent Labs Lab 07/12/16 1841 07/12/16 2305  TROPONINI 0.06* <0.03    Glucose No results for input(s): GLUCAP in the last 168 hours.  Imaging Dg Chest Port 1 View  Result Date: 07/13/2016 CLINICAL DATA:  Respiratory failure. EXAM: PORTABLE CHEST 1 VIEW COMPARISON:  Radiograph of July 12, 2016. FINDINGS: Endotracheal and nasogastric tubes are unchanged in position. No pneumothorax  is noted. Stable small bilateral pleural effusions are noted. Stable left basilar atelectasis noted. Stable right lung opacity is noted concerning for atelectasis. Bony thorax is unremarkable. IMPRESSION: Stable support apparatus. Stable bilateral lung opacities and pleural effusions. Electronically Signed   By: Lupita Raider, M.D.   On: 07/13/2016 07:34   Dg Chest Port 1 View  Result Date: 07/12/2016 CLINICAL DATA:  82 year old male with aspiration. Intubated today. Initial encounter. EXAM: PORTABLE CHEST 1 VIEW COMPARISON:  0828 hours today and earlier. FINDINGS: Portable AP semi upright view at 2219 hours. Endotracheal tube tip in good position between the level the clavicles and  carina. Enteric tube has been placed and courses to the abdomen, tip not included. Improved right lung base ventilation with residual Patchy and confluent opacity and suspected small bilateral pleural effusions. No areas of worsening ventilation. No pneumothorax. No pulmonary edema. Stable cardiomegaly and mediastinal contours. Calcified aortic atherosclerosis. IMPRESSION: 1. ET tube in good position. Enteric tube has been placed coursing into the abdomen, tip not included. 2. Improved right lung base ventilation with patchy residual opacity. Small bilateral pleural effusions. 3. No new cardiopulmonary abnormality. 4. Calcified aortic atherosclerosis. Electronically Signed   By: Odessa Fleming M.D.   On: 07/12/2016 22:39     STUDIES:  CT head 8/2 > displaced fx of C1 along with fx of C6.  CULTURES: Sputum 8/3 > Gonorrhea 8/3 > Chlamydia 8/3 >  ANTIBIOTICS: Unasyn 8/3 >  SIGNIFICANT EVENTS: 8/2 > admitted at [facility] with C1 and C6 fx, intubated for resp insufficiency. 8/3 > transferred to [facility] ICU.  LINES/TUBES: ETT 8/3 >  DISCUSSION: 82 y.o. M who suffered mechanical fall 8/1 and sustained C1 and C6 fx's.  He required intubation due to concern for airway compromise 8/3 and was later transferred to [facility] for further evaluation and management.  ASSESSMENT / PLAN:  NEUROLOGIC A:   Acute encephalopathy. Displaced C1 and C6 fx's - s/p mechanical fall. P:   Sedation:  Propofol gtt / fentanyl PRN RASS goal: 0  WUA. Seen by neurosurgery, no role for surgery.  Continue C collar and follow up in 6 weeks.  PULMONARY A: VDRF - due to concern for airway compromise in the setting of C1 and C6 fx's. Atelectasis, collapse resolved rt / asp P:   Allow mucomysts to dc in 2 more doses pcxr is improved, reduce peep slowly to 5 ABG reviewed, reduced to 7 cc/kg for that Once peep to 5, will sbt  Will need to discuss reintubation wishes ABX  CARDIOVASCULAR A:  Hx HTN, HLD, DVT on coumadin. P:   Monitor hemodynamics. Continue outpatient atorvastatin. Hold outpatient lisinopril, niacin, coumadin Held anticoagualation for cervical fx, will re assess when to restart with NS Dc further trop  RENAL A:   No acute issues. P:   NS @ 100, consider kvo BMP in AM.  GASTROINTESTINAL A:   GI prophylaxis. Nutrition. P:   SUP: Pantoprazole. NPO. Start TF  HEMATOLOGIC A:   On chronic coumadin for chronic DVT. VTE Prophylaxis. P:  Hold all anticoayulation with risk spinal cord injury associated CBC in AM.  INFECTIOUS A:   Concern for aspiration PNA. Green / white discharge from around foley insertion site. P:   Abx as above (unasyn).  Follow cultures as above - pending gonorrhea, chlamydia, HIV.  ENDOCRINE A:   At risk hyperglycemia   P:   Add ssi  Family updated: None available.  Interdisciplinary Family Meeting v Palliative Care Meeting:  Due by:  07/19/16  CC time: 30 minutes.  Mcarthur Rossetti. Tyson Alias, MD, FACP Pgr: [phone] [place] [facility] 07/13/2016 8:00 AM

## 2016-07-14 ENCOUNTER — Inpatient Hospital Stay (HOSPITAL_COMMUNITY): Payer: Medicare Other

## 2016-07-14 LAB — CBC WITH DIFFERENTIAL/PLATELET
BASOS ABS: 0 10*3/uL (ref 0.0–0.1)
BASOS PCT: 0 %
EOS ABS: 0.2 10*3/uL (ref 0.0–0.7)
EOS PCT: 1 %
HCT: 39.2 % (ref 39.0–52.0)
Hemoglobin: 12.1 g/dL — ABNORMAL LOW (ref 13.0–17.0)
Lymphocytes Relative: 7 %
Lymphs Abs: 0.8 10*3/uL (ref 0.7–4.0)
MCH: 29.7 pg (ref 26.0–34.0)
MCHC: 30.9 g/dL (ref 30.0–36.0)
MCV: 96.1 fL (ref 78.0–100.0)
MONO ABS: 1 10*3/uL (ref 0.1–1.0)
Monocytes Relative: 8 %
NEUTROS ABS: 9.7 10*3/uL — AB (ref 1.7–7.7)
Neutrophils Relative %: 84 %
PLATELETS: 269 10*3/uL (ref 150–400)
RBC: 4.08 MIL/uL — ABNORMAL LOW (ref 4.22–5.81)
RDW: 14.1 % (ref 11.5–15.5)
WBC: 11.7 10*3/uL — ABNORMAL HIGH (ref 4.0–10.5)

## 2016-07-14 LAB — MAGNESIUM
MAGNESIUM: 1.8 mg/dL (ref 1.7–2.4)
MAGNESIUM: 2 mg/dL (ref 1.7–2.4)

## 2016-07-14 LAB — PROTIME-INR
INR: 1.52
PROTHROMBIN TIME: 18.5 s — AB (ref 11.4–15.2)

## 2016-07-14 LAB — PHOSPHORUS
PHOSPHORUS: 3.2 mg/dL (ref 2.5–4.6)
Phosphorus: 3.8 mg/dL (ref 2.5–4.6)

## 2016-07-14 LAB — GLUCOSE, CAPILLARY
GLUCOSE-CAPILLARY: 114 mg/dL — AB (ref 65–99)
GLUCOSE-CAPILLARY: 102 mg/dL — AB (ref 65–99)
Glucose-Capillary: 113 mg/dL — ABNORMAL HIGH (ref 65–99)
Glucose-Capillary: 106 mg/dL — ABNORMAL HIGH (ref 65–99)
Glucose-Capillary: 106 mg/dL — ABNORMAL HIGH (ref 65–99)
Glucose-Capillary: 92 mg/dL (ref 65–99)
Glucose-Capillary: 112 mg/dL — ABNORMAL HIGH (ref 65–99)

## 2016-07-14 LAB — BASIC METABOLIC PANEL
ANION GAP: 12 (ref 5–15)
BUN: 23 mg/dL — AB (ref 6–20)
CALCIUM: 8.3 mg/dL — AB (ref 8.9–10.3)
CO2: 29 mmol/L (ref 22–32)
CREATININE: 0.7 mg/dL (ref 0.61–1.24)
Chloride: 101 mmol/L (ref 101–111)
GFR calc Af Amer: >60 mL/min (ref >60)
GLUCOSE: 104 mg/dL — AB (ref 65–99)
Potassium: 3.7 mmol/L (ref 3.5–5.1)
Sodium: 142 mmol/L (ref 135–145)

## 2016-07-14 NOTE — Progress Notes (Signed)
PULMONARY / CRITICAL CARE MEDICINE   Name: Allen Hamilton MRN: [id number] DOB: [birth year]    ADMISSION DATE:  07/12/2016 CONSULTATION DATE:  07/12/16  REFERRING MD:  [facility]  CHIEF COMPLAINT:  C1 fracture s/p fall  BRIEF: 82 y/o male with HTN, HLD and DVT on coumadin here with a C1 fracture post fall, intubated but neurologically intact, has RLL aspiration pneumonia and mucus plugging.   SUBJECTIVE:  CXR worse this morning Has not weaned No acute events  VITAL SIGNS: BP 122/62   Pulse 63   Temp 98.9 F (37.2 C) (Axillary)   Resp 14   Ht 5\' 10"  (1.778 m)   Wt 115.2 kg (253 lb 15.5 oz)   SpO2 99%   BMI 36.44 kg/m   HEMODYNAMICS:    VENTILATOR SETTINGS: Vent Mode: PRVC FiO2 (%):  [40 %] 40 % Set Rate:  [14 bmp] 14 bmp Vt Set:  [510 mL] 510 mL PEEP:  [5 cmH20] 5 cmH20 Pressure Support:  [15 cmH20] 15 cmH20 Plateau Pressure:  [20 cmH20-27 cmH20] 24 cmH20  INTAKE / OUTPUT: I/O last 3 completed shifts: In: 3586.4 [I.V.:2091.8; NG/GT:640; IV Piggyback:854.6] Out: 1260 [Urine:1260]   PHYSICAL EXAMINATION: General: sedated on vent HENT: NCAT hard collar and ETT in place PULM: RLL collapse, otherwise clear CV: RRR, no mgr GI: BS+, soft, nontender Neuro: sedated on vent Derm: no rash or skin breakdown  LABS:  BMET  Recent Labs Lab 07/12/16 1841 07/13/16 0725 07/14/16 0539  NA 139 141 142  K 3.7 3.7 3.7  CL 98* 101 101  CO2 33* 32 29  BUN 17 18 23*  CREATININE 0.96 0.94 0.70  GLUCOSE 114* 115* 104*    Electrolytes  Recent Labs Lab 07/12/16 1841 07/13/16 0725 07/13/16 1623 07/14/16 0539  CALCIUM 8.7* 8.3*  --  8.3*  MG 1.9 1.8 1.8 1.8  PHOS <1.0* 2.6 3.1 3.2    CBC  Recent Labs Lab 07/12/16 1841 07/13/16 0725 07/14/16 0539  WBC 16.9* 14.4* 11.7*  HGB 13.2 12.9* 12.1*  HCT 42.0 40.3 39.2  PLT 293 276 269    Coag's  Recent Labs Lab 07/12/16 1850 07/13/16 0725 07/14/16 0539  INR 2.55 1.97 1.52    Sepsis  Markers  Recent Labs Lab 07/12/16 1841 07/12/16 1854  LATICACIDVEN  --  1.2  PROCALCITON 0.15  --     ABG  Recent Labs Lab 07/12/16 1828 07/13/16 0326  PHART 7.529* 7.492*  PCO2ART 43.3 46.1*  PO2ART 104.0* 86.0    Liver Enzymes  Recent Labs Lab 07/12/16 1841  AST 15  ALT 14*  ALKPHOS 46  BILITOT 0.6  ALBUMIN 2.6*    Cardiac Enzymes  Recent Labs Lab 07/12/16 1841 07/12/16 2305 07/13/16 0725  TROPONINI 0.06* <0.03 <0.03    Glucose  Recent Labs Lab 07/13/16 0851 07/13/16 1224 07/13/16 1643 07/13/16 2321 07/14/16 0418  GLUCAP 98 101* 108* 113* 106*    Imaging No results found.   STUDIES:  CT head 8/2 > displaced fx of C1 along with fx of C6  CULTURES: Sputum 8/3 > few GNR Gonorrhea 8/3 > Chlamydia 8/3 >  ANTIBIOTICS: Unasyn 8/3 >  SIGNIFICANT EVENTS: 8/2 > admitted at [facility] with C1 and C6 fx, intubated for resp insufficiency 8/3 > transferred to [facility] ICU  LINES/TUBES: ETT 8/3 >  DISCUSSION: 82 y.o. M who suffered mechanical fall 8/1 and sustained C1 and C6 fx's.  He required intubation due to concern for airway compromise 8/3 and was later transferred  to [facility] for further evaluation and management.  ASSESSMENT / PLAN:  NEUROLOGIC A:   Acute encephalopathy Displaced C1 and C6 fx's - s/p mechanical fall Seen by neurosurgery, no role for surgery.   P:   Sedation:  Propofol gtt / fentanyl PRN RASS goal: 0  WUA daily Continue C collar and follow up in 6 weeks  PULMONARY A: Acute respiratory failure with hypoxemia Aspiration pneumonia RLL Collapse/atelectasis RLL > worse today Atelectasis, collapse resolved rt / asp P:   Bag lavaage with saline today q shfit Repeat CXR in AM PSV as tolerated today If no improvement in CXR findings or if weaning poorly then consider bronchoscopy 8/6 VAP prevention  CARDIOVASCULAR A:  Hx HTN, HLD, DVT on coumadin P:  Monitor hemodynamics Continue outpatient atorvastatin Hold outpatient  lisinopril, niacin, coumadin Continue to hold anticoagulation, will discuss timing of restart with neurosurgery  RENAL A:   No acute issues P:   KVO fluids Monitor BMET and UOP Replace electrolytes as needed   GASTROINTESTINAL A:   GI prophylaxis Nutrition P:   SUP: Pantoprazole Continue TF  HEMATOLOGIC A:   On chronic coumadin for chronic DVT VTE Prophylaxis P:  Hold all anticoayulation with risk spinal cord injury, discuss with neurosurgery CBC in AM  INFECTIOUS A:   Aspiration pneumonia Penile discharge P:   Abx as above (unasyn) Follow cultures  ENDOCRINE A:   At risk hyperglycemia   P:   Add ssi  Family updated: None available 8/5  Interdisciplinary Family Meeting v Palliative Care Meeting:  Due by: 07/19/16  CC time: 31 minutes.  Heber Cushing, MD [place] [facility] Pager: [phone] Cell: [phone] After 3pm or if no response, call [phone]  07/14/2016 7:39 AM

## 2016-07-14 NOTE — Progress Notes (Signed)
CPT held, pt is sleeping comfortably at this time no distress or complications noted 

## 2016-07-15 ENCOUNTER — Inpatient Hospital Stay (HOSPITAL_COMMUNITY): Payer: Medicare Other

## 2016-07-15 LAB — PROTIME-INR
INR: 1.25
PROTHROMBIN TIME: 15.7 s — AB (ref 11.4–15.2)

## 2016-07-15 LAB — GONOCOCCUS CULTURE

## 2016-07-15 LAB — GLUCOSE, CAPILLARY
GLUCOSE-CAPILLARY: 104 mg/dL — AB (ref 65–99)
GLUCOSE-CAPILLARY: 113 mg/dL — AB (ref 65–99)
Glucose-Capillary: 103 mg/dL — ABNORMAL HIGH (ref 65–99)
Glucose-Capillary: 106 mg/dL — ABNORMAL HIGH (ref 65–99)
Glucose-Capillary: 97 mg/dL (ref 65–99)

## 2016-07-15 LAB — BASIC METABOLIC PANEL
ANION GAP: 6 (ref 5–15)
BUN: 22 mg/dL — ABNORMAL HIGH (ref 6–20)
CALCIUM: 8.3 mg/dL — AB (ref 8.9–10.3)
CO2: 33 mmol/L — ABNORMAL HIGH (ref 22–32)
CREATININE: 0.75 mg/dL (ref 0.61–1.24)
Chloride: 101 mmol/L (ref 101–111)
Glucose, Bld: 125 mg/dL — ABNORMAL HIGH (ref 65–99)
Potassium: 4.7 mmol/L (ref 3.5–5.1)
SODIUM: 140 mmol/L (ref 135–145)

## 2016-07-15 LAB — TRIGLYCERIDES: TRIGLYCERIDES: 89 mg/dL (ref <150)

## 2016-07-15 LAB — HEPARIN LEVEL (UNFRACTIONATED): HEPARIN UNFRACTIONATED: 0.3 [IU]/mL (ref 0.30–0.70)

## 2016-07-15 MED ORDER — HEPARIN (PORCINE) IN NACL 100-0.45 UNIT/ML-% IJ SOLN
1750.0000 [IU]/h | INTRAMUSCULAR | Status: DC
  Administered 2016-07-15: 1650 [IU]/h via INTRAVENOUS
  Administered 2016-07-16 – 2016-07-17 (×3): 1750 [IU]/h via INTRAVENOUS
  Filled 2016-07-15 (×4): qty 250

## 2016-07-15 MED ORDER — WARFARIN - PHARMACIST DOSING INPATIENT
Freq: Every day | Status: DC
  Administered 2016-07-15: 1
  Administered 2016-07-17: 18:00:00

## 2016-07-15 MED ORDER — FUROSEMIDE 10 MG/ML IJ SOLN
40.0000 mg | Freq: Four times a day (QID) | INTRAMUSCULAR | Status: AC
  Administered 2016-07-15 (×2): 40 mg via INTRAVENOUS
  Filled 2016-07-15 (×2): qty 4

## 2016-07-15 MED ORDER — WARFARIN SODIUM 5 MG PO TABS
10.0000 mg | ORAL_TABLET | Freq: Once | ORAL | Status: AC
  Administered 2016-07-15: 10 mg via ORAL
  Filled 2016-07-15: qty 2

## 2016-07-15 NOTE — Progress Notes (Signed)
ANTICOAGULATION CONSULT NOTE - f/u Consult  Pharmacy Consult for Warfarin/Heparin. Indication: Chronic warfarin PTA for prior DVT.  Allergies  Allergen Reactions  . Hydromorphone     Respiratory depression    Patient Measurements: Height: 5\' 10"  (177.8 cm) Weight: 255 lb 8.2 oz (115.9 kg) IBW/kg (Calculated) : 73 Heparin Dosing Weight: 98.6  Vital Signs: Temp: 98.5 F (36.9 C) (08/06 2000) Temp Source: Oral (08/06 2000) BP: 144/72 (08/06 2000) Pulse Rate: 94 (08/06 2000)  Labs:  Recent Labs  07/12/16 2305 07/13/16 0725 07/14/16 0539 07/15/16 0831 07/15/16 2035  HGB  --  12.9* 12.1*  --   --   HCT  --  40.3 39.2  --   --   PLT  --  276 269  --   --   LABPROT  --  22.7* 18.5* 15.7*  --   INR  --  1.97 1.52 1.25  --   HEPARINUNFRC  --   --   --   --  0.30  CREATININE  --  0.94 0.70 0.75  --   TROPONINI <0.03 <0.03  --   --   --     Estimated Creatinine Clearance: 94 mL/min (by C-G formula based on SCr of 0.8 mg/dL).   Assessment: Hx DVTs on chronic coumadin PTA, held warf for cervical fx and started on heparin 5000 [place] TID. Per neurosurg okay to start anticoagulation, rx consulted for warfarin/UFH on 8/6. INR 2.55 on admit, now down to 1.25, hgb/plt wnl. Last heparin [place] dose was 8/6 1350 so no bolus, will d/c [place] and start gtt. --First HL 0.3 in low goal range.  **PTA warfarin dose: 8 mg Mon/Thurs, 9 mg ROW  Goal of Therapy:  INR 2-3 Monitor platelets by anticoagulation protocol: Yes    Plan:  -Initiate heparin 1750 units/hr -Daily INR, HL, CBC, S/Sx bleeding  Bennett Vanscyoc S. Merilynn Finlandobertson, PharmD, [facility] Clinical Staff Pharmacist Pager [phone]  07/15/2016

## 2016-07-15 NOTE — Progress Notes (Signed)
Per CCM MD order, saline pushed down ett and pt bag lavaged x1 minute. Pt stable throughout with no complications. Very little white/yellow/tan thick secretions obtained. RN aware. RT will continue to monitor.

## 2016-07-15 NOTE — Progress Notes (Signed)
PULMONARY / CRITICAL CARE MEDICINE   Name: Allen Hamilton MRN: [id number] DOB: [birth year]    ADMISSION DATE:  07/12/2016 CONSULTATION DATE:  07/12/16  REFERRING MD:  [facility]  CHIEF COMPLAINT:  C1 fracture s/p fall  BRIEF: 82 y/o male with HTN, HLD and DVT on coumadin here with a C1 fracture post fall, intubated but neurologically intact, has RLL aspiration pneumonia and mucus plugging.   SUBJECTIVE:  Has lots of mucus out with bag lavage CXR slightly better Weaned yesterday More awake and alert  VITAL SIGNS: BP (!) 121/57   Pulse (!) 59   Temp 98.5 F (36.9 C) (Axillary)   Resp 14   Ht  (1.778 m)   Wt 115.9 kg (255 lb 8.2 oz)   SpO2 98%   BMI 36.66 kg/m   HEMODYNAMICS:    VENTILATOR SETTINGS: Vent Mode: PRVC FiO2 (%):  [40 %] 40 % Set Rate:  [14 bmp] 14 bmp Vt Set:  [510 mL] 510 mL PEEP:  [5 cmH20] 5 cmH20 Pressure Support:  [10 cmH20] 10 cmH20 Plateau Pressure:  [20 cmH20-21 cmH20] 21 cmH20  INTAKE / OUTPUT: I/O last 3 completed shifts: In: 3185.1 [I.V.:1055.1; NG/GT:1530; IV Piggyback:600] Out: 2695 [Urine:2695]   PHYSICAL EXAMINATION: General: awake on vent HENT: NCAT hard collar and ETT in place PULM: RLL collapse, otherwise clear CV: RRR, no mgr GI: BS+, soft, nontender Neuro: awake on vent, moves all four ext, following commands Derm: no rash or skin breakdown  LABS:  BMET  Recent Labs Lab 07/12/16 1841 07/13/16 0725 07/14/16 0539  NA 139 141 142  K 3.7 3.7 3.7  CL 98* 101 101  CO2 33* 32 29  BUN 17 18 23*  CREATININE 0.96 0.94 0.70  GLUCOSE 114* 115* 104*    Electrolytes  Recent Labs Lab 07/12/16 1841 07/13/16 0725 07/13/16 1623 07/14/16 0539 07/14/16 1640  CALCIUM 8.7* 8.3*  --  8.3*  --   MG 1.9 1.8 1.8 1.8 2.0  PHOS <1.0* 2.6 3.1 3.2 3.8    CBC  Recent Labs Lab 07/12/16 1841 07/13/16 0725 07/14/16 0539  WBC 16.9* 14.4* 11.7*  HGB 13.2 12.9* 12.1*  HCT 42.0 40.3 39.2  PLT 293 276 269     Coag's  Recent Labs Lab 07/12/16 1850 07/13/16 0725 07/14/16 0539  INR 2.55 1.97 1.52    Sepsis Markers  Recent Labs Lab 07/12/16 1841 07/12/16 1854  LATICACIDVEN  --  1.2  PROCALCITON 0.15  --     ABG  Recent Labs Lab 07/12/16 1828 07/13/16 0326  PHART 7.529* 7.492*  PCO2ART 43.3 46.1*  PO2ART 104.0* 86.0    Liver Enzymes  Recent Labs Lab 07/12/16 1841  AST 15  ALT 14*  ALKPHOS 46  BILITOT 0.6  ALBUMIN 2.6*    Cardiac Enzymes  Recent Labs Lab 07/12/16 1841 07/12/16 2305 07/13/16 0725  TROPONINI 0.06* <0.03 <0.03    Glucose  Recent Labs Lab 07/14/16 0859 07/14/16 1131 07/14/16 1637 07/14/16 1931 07/14/16 2307 07/15/16 0325  GLUCAP 114* 106* 92 102* 112* 104*    Imaging Dg Chest Port 1 View  Result Date: 07/15/2016 CLINICAL DATA:  Atelectasis. EXAM: PORTABLE CHEST 1 VIEW COMPARISON:  July 14, 2016 FINDINGS: The ETT is in good position. The NG tube terminates in left upper quadrant. No pneumothorax. Effusion and opacity on the right is stable to mildly improved. Mild pulmonary venous congestion. Small effusion on the left. No change in the cardiomediastinal silhouette. No other changes. IMPRESSION: Stable effusions,  right greater than left. Underlying opacity on the right is mildly improved. Mild edema. Stable support apparatus. Electronically Signed   By: Gerome Samavid  Williams III M.D   On: 07/15/2016 07:36     STUDIES:  CT head 8/2 > displaced fx of C1 along with fx of C6  CULTURES: Sputum 8/3 > few GNR Gonorrhea 8/3 > Chlamydia 8/3 >  ANTIBIOTICS: Unasyn 8/3 >  SIGNIFICANT EVENTS: 8/2 > [facility] and C6 f[facility], intubated for resp insufficiency 8/3 > [facility]  LINES/TUBES: ETT 8/3 >  DISCUSSION: 82 y.o. M who suffered mechanical fall 8/1 and sustained C1 and C6 fx's.  He required intubation due to concern for airway compromise 8/3 and [facility] and  [facility].  ASSESSMENT / PLAN:  NEUROLOGIC A:   Acute encephalopathy > improving Displaced C1 and C6 fx's - s/p mechanical fall Seen by neurosurgery, no role for surgery.   P:   Sedation:  Propofol gtt / fentanyl PRN RASS goal: 0  WUA daily Continue C collar and follow up in 6 weeks  PULMONARY A: Acute respiratory failure with hypoxemia > improving Aspiration pneumonia RLL Collapse/atelectasis RLL > worse today Atelectasis, collapse resolved rt / asp P:   Bag lavaage with saline q shfit Repeat CXR in AM PSV as tolerated 07/15/2016 If no improvement in CXR findings or if weaning poorly then consider bronchoscopy 8/7 VAP prevention  CARDIOVASCULAR A:  Hx HTN, HLD, DVT on coumadin P:  Monitor hemodynamics Continue outpatient atorvastatin Hold outpatient lisinopril, niacin, coumadin Continue to hold anticoagulation, will discuss timing of restart with neurosurgery  RENAL A:   No acute issues P:   Monitor BMET and UOP Replace electrolytes as needed   GASTROINTESTINAL A:   GI prophylaxis Nutrition P:   SUP: Pantoprazole Continue TF  HEMATOLOGIC A:   On chronic coumadin for chronic DVT VTE Prophylaxis P:  Hold all anticoagulation with risk spinal cord injury, discuss with neurosurgery CBC in AM  INFECTIOUS A:   Aspiration pneumonia Penile discharge P:   Abx as above (unasyn) Follow cultures  ENDOCRINE A:   At risk hyperglycemia   P:   Continue ssi  Family updated: None available 8/6  Interdisciplinary Family Meeting v Palliative Care Meeting:  Due by: 07/19/16  CC time: 32 minutes.  Heber CarolinaBrent McQuaid, MD [place] P[facility]M Pager: [phone] Cell: [phone] After 3pm or if no response, call [phone]  07/15/2016 8:17 AM

## 2016-07-15 NOTE — Progress Notes (Signed)
[facility] P[facility]M  Discussed with neurosurgery, OK to restart warfarin for VTE treatment. Will restart heparin in meantime.  Heber CarolinaBrent McQuaid, MD [place] P[facility]M Pager: [phone] Cell: [phone] After 3pm or if no response, call [phone]

## 2016-07-15 NOTE — Progress Notes (Signed)
ANTICOAGULATION CONSULT NOTE - Initial Consult  Pharmacy Consult for Warfarin/Heparin. Indication: Chronic warfarin PTA for prior DVT.  Allergies  Allergen Reactions  . Hydromorphone     Respiratory depression    Patient Measurements: Height: 5\' 10"  (177.8 cm) Weight: 255 lb 8.2 oz (115.9 kg) IBW/kg (Calculated) : 73 Heparin Dosing Weight: 98.6  Vital Signs: Temp: 98.5 F (36.9 C) (08/06 1210) Temp Source: Axillary (08/06 1210) BP: 141/64 (08/06 1300) Pulse Rate: 70 (08/06 1300)  Labs:  Recent Labs  07/12/16 1841  07/12/16 2305 07/13/16 0725 07/14/16 0539 07/15/16 0831  HGB 13.2  --   --  12.9* 12.1*  --   HCT 42.0  --   --  40.3 39.2  --   PLT 293  --   --  276 269  --   LABPROT  --   < >  --  22.7* 18.5* 15.7*  INR  --   < >  --  1.97 1.52 1.25  CREATININE 0.96  --   --  0.94 0.70 0.75  TROPONINI 0.06*  --  <0.03 <0.03  --   --   < > = values in this interval not displayed.  Estimated Creatinine Clearance: 94 mL/min (by C-G formula based on SCr of 0.8 mg/dL).   Assessment: Hx DVTs on chronic coumadin PTA, held warf for cervical fx and started on heparin 5000 [place] TID. Per neurosurg okay to start anticoagulation, rx consulted for warfarin/UFH on 8/6. INR 2.55 on admit, now down to 1.25, hgb/plt wnl. Last heparin [place] dose was 8/6 1350 so no bolus, will d/c [place] and start gtt.  **PTA warfarin dose: 8 mg Mon/Thurs, 9 mg ROW  Goal of Therapy:  INR 2-3 Monitor platelets by anticoagulation protocol: Yes    Plan:  -Discontinue heparin [place] 5000 TID -Initiate heparin 1650 units/hr -Warfarin 10 mg PO x1 -HL 2100 -Daily INR, HL, CBC, S/Sx bleeding  Fredonia HighlandMichael Jaiden Dinkins, PharmD PGY-1 Pharmacy Resident Pager: [phone] 07/15/2016

## 2016-07-16 ENCOUNTER — Inpatient Hospital Stay (HOSPITAL_COMMUNITY): Payer: Medicare Other

## 2016-07-16 LAB — CBC
HEMATOCRIT: 39.6 % (ref 39.0–52.0)
HEMOGLOBIN: 12.5 g/dL — AB (ref 13.0–17.0)
MCH: 30.3 pg (ref 26.0–34.0)
MCHC: 31.6 g/dL (ref 30.0–36.0)
MCV: 96.1 fL (ref 78.0–100.0)
Platelets: 300 10*3/uL (ref 150–400)
RBC: 4.12 MIL/uL — ABNORMAL LOW (ref 4.22–5.81)
RDW: 14.2 % (ref 11.5–15.5)
WBC: 12.3 10*3/uL — AB (ref 4.0–10.5)

## 2016-07-16 LAB — GLUCOSE, CAPILLARY
GLUCOSE-CAPILLARY: 102 mg/dL — AB (ref 65–99)
GLUCOSE-CAPILLARY: 104 mg/dL — AB (ref 65–99)
GLUCOSE-CAPILLARY: 109 mg/dL — AB (ref 65–99)
Glucose-Capillary: 106 mg/dL — ABNORMAL HIGH (ref 65–99)
Glucose-Capillary: 110 mg/dL — ABNORMAL HIGH (ref 65–99)
Glucose-Capillary: 111 mg/dL — ABNORMAL HIGH (ref 65–99)
Glucose-Capillary: 99 mg/dL (ref 65–99)

## 2016-07-16 LAB — BASIC METABOLIC PANEL
Anion gap: 8 (ref 5–15)
BUN: 30 mg/dL — AB (ref 6–20)
CHLORIDE: 98 mmol/L — AB (ref 101–111)
CO2: 35 mmol/L — AB (ref 22–32)
Calcium: 8.7 mg/dL — ABNORMAL LOW (ref 8.9–10.3)
Creatinine, Ser: 0.85 mg/dL (ref 0.61–1.24)
GFR calc non Af Amer: >60 mL/min (ref >60)
Glucose, Bld: 112 mg/dL — ABNORMAL HIGH (ref 65–99)
POTASSIUM: 3.7 mmol/L (ref 3.5–5.1)
SODIUM: 141 mmol/L (ref 135–145)

## 2016-07-16 LAB — CULTURE, RESPIRATORY: CULTURE: NORMAL

## 2016-07-16 LAB — CHLAMYDIA CULTURE: Chlamydia Trachomatis Culture: NEGATIVE

## 2016-07-16 LAB — HEPARIN LEVEL (UNFRACTIONATED): HEPARIN UNFRACTIONATED: 0.5 [IU]/mL (ref 0.30–0.70)

## 2016-07-16 LAB — PROTIME-INR
INR: 1.23
Prothrombin Time: 15.6 seconds — ABNORMAL HIGH (ref 11.4–15.2)

## 2016-07-16 LAB — CULTURE, RESPIRATORY W GRAM STAIN

## 2016-07-16 MED ORDER — WARFARIN SODIUM 5 MG PO TABS
10.0000 mg | ORAL_TABLET | Freq: Once | ORAL | Status: AC
  Administered 2016-07-16: 10 mg via ORAL
  Filled 2016-07-16: qty 2

## 2016-07-16 NOTE — Progress Notes (Signed)
ANTICOAGULATION CONSULT NOTE - f/u Consult  Pharmacy Consult for Warfarin/Heparin. Indication: Chronic warfarin PTA for prior DVT.  Allergies  Allergen Reactions  . Hydromorphone     Respiratory depression    Patient Measurements: Height: 5\' 10"  (177.8 cm) Weight: 253 lb 1.4 oz (114.8 kg) IBW/kg (Calculated) : 73 Heparin Dosing Weight: 98.6  Vital Signs: Temp: 99.1 F (37.3 C) (08/07 0820) Temp Source: Axillary (08/07 0820) BP: 137/59 (08/07 1000) Pulse Rate: 67 (08/07 1000)  Labs:  Recent Labs  07/14/16 0539 07/15/16 0831 07/15/16 2035 07/16/16 0323 07/16/16 0518  HGB 12.1*  --   --   --  12.5*  HCT 39.2  --   --   --  39.6  PLT 269  --   --   --  300  LABPROT 18.5* 15.7*  --  15.6*  --   INR 1.52 1.25  --  1.23  --   HEPARINUNFRC  --   --  0.30 0.50  --   CREATININE 0.70 0.75  --   --  0.85    Estimated Creatinine Clearance: 87.9 mL/min (by C-G formula based on SCr of 0.85 mg/dL).   Assessment:  82 yo male with Hx DVTs on chronic coumadin PTA. Coumadin was held warf for cervical fx. No procedures plans and pharmacy consulted to restart coumadin and heparin  -Heparin level= 0.5, INR= 1.23  **PTA warfarin dose: 8 mg Mon/Thurs, 9 mg ROW  Goal of Therapy:  INR 2-3 Monitor platelets by anticoagulation protocol: Yes    Plan:  -No heparin changes needed -Coumadin 10mg  po today -Daily INR, heparin level and CBC  Harland GermanAndrew Frieda Arnall, Pharm D 07/16/2016 10:29 AM

## 2016-07-16 NOTE — Care Management Important Message (Signed)
Important Message  Patient Details  Name: Allen Hamilton MRN: [id number] Date of Birth: 1933/07/22   Medicare Important Message Given:  Yes    [name] 07/16/2016, 10:59 AM

## 2016-07-16 NOTE — Progress Notes (Signed)
PULMONARY / CRITICAL CARE MEDICINE   Name: Allen Hamilton MRN: [id number] DOB: [birth year]    ADMISSION DATE:  07/12/2016 CONSULTATION DATE:  07/12/16  [facility]  CHIEF COMPLAINT:  C1 fracture s/p fall  BRIEF: 82 y/o male with HTN, HLD and DVT on coumadin here with a C1 fracture post fall, intubated but neurologically intact, has RLL aspiration pneumonia and mucus plugging.   SUBJECTIVE:  Tolerating PSV 12 at this time More awake  VITAL SIGNS: BP (!) 137/59   Pulse 67   Temp 99.1 F (37.3 C) (Axillary)   Resp 14   Ht 5\' 10"  (1.778 m)   Wt 114.8 kg (253 lb 1.4 oz)   SpO2 98%   BMI 36.31 kg/m   HEMODYNAMICS:    VENTILATOR SETTINGS: Vent Mode: PRVC FiO2 (%):  [40 %-50 %] 40 % Set Rate:  [14 bmp] 14 bmp Vt Set:  [510 mL] 510 mL PEEP:  [5 cmH20] 5 cmH20 Pressure Support:  [10 cmH20] 10 cmH20 Plateau Pressure:  [19 cmH20-22 cmH20] 19 cmH20  INTAKE / OUTPUT: I/O last 3 completed shifts: In: 3568.7 [I.V.:1428.7; NG/GT:1540; IV Piggyback:600] Out: 4860 [Urine:4860]   PHYSICAL EXAMINATION: General: awake on vent HENT: NCAT hard collar and ETT in place PULM: RLL decreased BS, otherwise clear CV: RRR, no mgr GI: BS+, soft, nontender Neuro: awake on vent, moves all four ext, following commands Derm: no rash or skin breakdown  LABS:  BMET  Recent Labs Lab 07/14/16 0539 07/15/16 0831 07/16/16 0518  NA 142 140 141  K 3.7 4.7 3.7  CL 101 101 98*  CO2 29 33* 35*  BUN 23* 22* 30*  CREATININE 0.70 0.75 0.85  GLUCOSE 104* 125* 112*    Electrolytes  Recent Labs Lab 07/13/16 1623 07/14/16 0539 07/14/16 1640 07/15/16 0831 07/16/16 0518  CALCIUM  --  8.3*  --  8.3* 8.7*  MG 1.8 1.8 2.0  --   --   PHOS 3.1 3.2 3.8  --   --     CBC  Recent Labs Lab 07/13/16 0725 07/14/16 0539 07/16/16 0518  WBC 14.4* 11.7* 12.3*  HGB 12.9* 12.1* 12.5*  HCT 40.3 39.2 39.6  PLT 276 269 300    Coag's  Recent Labs Lab 07/14/16 0539  07/15/16 0831 07/16/16 0323  INR 1.52 1.25 1.23    Sepsis Markers  Recent Labs Lab 07/12/16 1841 07/12/16 1854  LATICACIDVEN  --  1.2  PROCALCITON 0.15  --     ABG  Recent Labs Lab 07/12/16 1828 07/13/16 0326  PHART 7.529* 7.492*  PCO2ART 43.3 46.1*  PO2ART 104.0* 86.0    Liver Enzymes  Recent Labs Lab 07/12/16 1841  AST 15  ALT 14*  ALKPHOS 46  BILITOT 0.6  ALBUMIN 2.6*    Cardiac Enzymes  Recent Labs Lab 07/12/16 1841 07/12/16 2305 07/13/16 0725  TROPONINI 0.06* <0.03 <0.03    Glucose  Recent Labs Lab 07/15/16 1209 07/15/16 1524 07/15/16 1924 07/15/16 2352 07/16/16 0354 07/16/16 0811  GLUCAP 106* 97 113* 109* 102* 111*    Imaging Dg Chest Port 1 View  Result Date: 07/16/2016 CLINICAL DATA:  Respiratory failure.  Hypoxia. EXAM: PORTABLE CHEST 1 VIEW COMPARISON:  07/15/2016. FINDINGS: Endotracheal tube and NG tube in stable position. Stable cardiomegaly. Bibasilar atelectasis and/or infiltrates with small bilateral pleural effusions. No pneumothorax. IMPRESSION: 1.  Lines and tubes in stable position. 2. Cardiomegaly. Bibasilar atelectasis and/or infiltrates/edema with small bilateral pleural effusions. No change from prior exam. Electronically  Signed   By: Maisie Fus  Register   On: 07/16/2016 07:31     STUDIES:  CT head 8/2 > displaced fx of C1 along with fx of C6  CULTURES: Sputum 8/3 > few GNR Gonorrhea 8/3 > Chlamydia 8/3 >  ANTIBIOTICS: Unasyn 8/3 >  SIGNIFICANT EVENTS: 8/2 > admitted at [facility] with C1 and C6 fx, intubated for resp insufficiency 8/3 > transferred to [facility] ICU  LINES/TUBES: ETT 8/3 >  DISCUSSION: 82 y.o. M who suffered mechanical fall 8/1 and sustained C1 and C6 fx's.  He required intubation due to concern for airway compromise 8/3 and was later transferred to [facility] for further evaluation and management.  ASSESSMENT / PLAN:  NEUROLOGIC A:   Acute encephalopathy > improving Displaced C1 and C6 fx's - s/p  mechanical fall Seen by neurosurgery, no role for surgery.   P:   Sedation:  Propofol gtt / fentanyl PRN RASS goal: 0  WUA daily, propofol weaned to off this am for assessment Continue C collar and follow up in 6 weeks  PULMONARY A: Acute respiratory failure with hypoxemia > improving Aspiration pneumonia RLL Collapse/atelectasis RLL Atelectasis, collapse resolved rt / asp P:   Bag lavage with saline q shift > secretions improved 8/7 Follow CXR PSV as tolerated 07/16/2016 Better performance SBT on 8/7 am, goal extubation.  VAP prevention  CARDIOVASCULAR A:  Hx HTN, HLD, DVT on coumadin P:  Monitor hemodynamics Continue outpatient atorvastatin Hold outpatient lisinopril, niacin OK to restart anticoag per NSGY on 8/6  RENAL A:   No acute issues P:   Monitor BMET and UOP Replace electrolytes as needed   GASTROINTESTINAL A:   GI prophylaxis Nutrition P:   SUP: Pantoprazole Continue TF  HEMATOLOGIC A:   On chronic coumadin for chronic DVT VTE Prophylaxis P:  NSGY Ok with restart anticoagulation, restarted 8/6 Follow CBC  INFECTIOUS A:   Aspiration pneumonia RLL  Penile discharge P:   Abx as above (unasyn) Follow cultures  ENDOCRINE A:   At risk hyperglycemia   P:   Continue ssi  Family updated: spoke with the patient's son at bedside on 8/7  Interdisciplinary Family Meeting v Palliative Care Meeting:  Due by: 07/19/16  CC time: 35 minutes.  Levy Pupa, MD, PhD 07/16/2016, 11:02 AM [facility] [phone] or if no answer [phone]

## 2016-07-16 NOTE — Care Management Note (Signed)
Case Management Note  Patient Details  Name: Allen Hamilton MRN: [id number] Date of Birth: Aug 27, 1933  Subjective/Objective:   CM met with 2 dtrs and son who want information re options and payors for rehab as well as how to obtain HCPOA - provided family with information.  They also state that pt's wife has dementia and they have just discovered the severity, note that pt has been providing care and now they request information re options for her as they are unsure about his ability to resume care.  Discussed resources and need for DPOA vs guardiansip.                         Expected Discharge Plan:  Skilled Nursing Facility  In-House Referral:  Clinical Social Work  Discharge planning Services  CM Consult  Status of Service:  In process, will continue to follow  Girard Cooter, RN 07/16/2016, 8:40 AM

## 2016-07-17 ENCOUNTER — Inpatient Hospital Stay (HOSPITAL_COMMUNITY): Payer: Medicare Other

## 2016-07-17 LAB — POCT I-STAT 3, ART BLOOD GAS (G3+)
ACID-BASE EXCESS: 9 mmol/L — AB (ref 0.0–2.0)
BICARBONATE: 35.6 meq/L — AB (ref 20.0–24.0)
O2 SAT: 94 %
PO2 ART: 77 mmHg — AB (ref 80.0–100.0)
Patient temperature: 98.6
TCO2: 37 mmol/L (ref 0–100)
pCO2 arterial: 59.7 mmHg (ref 35.0–45.0)
pH, Arterial: 7.384 (ref 7.350–7.450)

## 2016-07-17 LAB — CBC
HEMATOCRIT: 40.8 % (ref 39.0–52.0)
HEMOGLOBIN: 12.6 g/dL — AB (ref 13.0–17.0)
MCH: 29.6 pg (ref 26.0–34.0)
MCHC: 30.9 g/dL (ref 30.0–36.0)
MCV: 96 fL (ref 78.0–100.0)
Platelets: 304 10*3/uL (ref 150–400)
RBC: 4.25 MIL/uL (ref 4.22–5.81)
RDW: 14.2 % (ref 11.5–15.5)
WBC: 15.5 10*3/uL — ABNORMAL HIGH (ref 4.0–10.5)

## 2016-07-17 LAB — CULTURE, BLOOD (ROUTINE X 2)
CULTURE: NO GROWTH
CULTURE: NO GROWTH

## 2016-07-17 LAB — GLUCOSE, CAPILLARY
GLUCOSE-CAPILLARY: 122 mg/dL — AB (ref 65–99)
Glucose-Capillary: 115 mg/dL — ABNORMAL HIGH (ref 65–99)
Glucose-Capillary: 108 mg/dL — ABNORMAL HIGH (ref 65–99)
Glucose-Capillary: 96 mg/dL (ref 65–99)

## 2016-07-17 LAB — PROTIME-INR
INR: 1.29
PROTHROMBIN TIME: 16.2 s — AB (ref 11.4–15.2)

## 2016-07-17 LAB — HEPARIN LEVEL (UNFRACTIONATED): Heparin Unfractionated: 0.55 IU/mL (ref 0.30–0.70)

## 2016-07-17 MED ORDER — HEPARIN (PORCINE) IN NACL 100-0.45 UNIT/ML-% IJ SOLN
1750.0000 [IU]/h | INTRAMUSCULAR | Status: DC
  Filled 2016-07-17: qty 250

## 2016-07-17 MED ORDER — CHLORHEXIDINE GLUCONATE 0.12% ORAL RINSE (MEDLINE KIT)
15.0000 mL | Freq: Two times a day (BID) | OROMUCOSAL | Status: DC
  Administered 2016-07-17 – 2016-07-26 (×17): 15 mL via OROMUCOSAL

## 2016-07-17 MED ORDER — FENTANYL CITRATE (PF) 100 MCG/2ML IJ SOLN
100.0000 ug | Freq: Once | INTRAMUSCULAR | Status: AC
Start: 2016-07-17 — End: 2016-07-17
  Administered 2016-07-17: 100 ug via INTRAVENOUS

## 2016-07-17 MED ORDER — MIDAZOLAM HCL 2 MG/2ML IJ SOLN
INTRAMUSCULAR | Status: AC
  Filled 2016-07-17: qty 4

## 2016-07-17 MED ORDER — WARFARIN SODIUM 7.5 MG PO TABS
12.5000 mg | ORAL_TABLET | Freq: Once | ORAL | Status: AC
  Administered 2016-07-17: 12.5 mg via ORAL
  Filled 2016-07-17: qty 1

## 2016-07-17 MED ORDER — ETOMIDATE 2 MG/ML IV SOLN
20.0000 mg | Freq: Once | INTRAVENOUS | Status: AC
  Administered 2016-07-17: 20 mg via INTRAVENOUS

## 2016-07-17 MED ORDER — FUROSEMIDE 10 MG/ML IJ SOLN
40.0000 mg | Freq: Once | INTRAMUSCULAR | Status: AC
  Administered 2016-07-17: 40 mg via INTRAVENOUS
  Filled 2016-07-17: qty 4

## 2016-07-17 MED ORDER — ANTISEPTIC ORAL RINSE SOLUTION (CORINZ)
7.0000 mL | OROMUCOSAL | Status: DC
  Administered 2016-07-17 – 2016-07-26 (×90): 7 mL via OROMUCOSAL

## 2016-07-17 NOTE — Progress Notes (Signed)
PULMONARY / CRITICAL CARE MEDICINE   Name: Allen Hamilton MRN: [id number] DOB: 11-Aug-1933    ADMISSION DATE:  07/12/2016 CONSULTATION DATE:  07/12/16  REFERRING MD:  [facility]  CHIEF COMPLAINT:  C1 fracture s/p fall  BRIEF: 82 y/o male with HTN, HLD and DVT on coumadin here with a C1 fracture post fall, intubated but neurologically intact, has RLL aspiration pneumonia and mucus plugging.   SUBJECTIVE:  Tolerating PSV 11 at this time More awake, off diprivan  VITAL SIGNS: BP 130/67   Pulse 67   Temp 98.6 F (37 C) (Oral)   Resp (!) 24   Ht  (1.778 m)   Wt 250 lb (113.4 kg)   SpO2 96%   BMI 35.87 kg/m   HEMODYNAMICS:    VENTILATOR SETTINGS: Vent Mode: PRVC FiO2 (%):  [40 %] 40 % Set Rate:  [14 bmp] 14 bmp Vt Set:  [510 mL] 510 mL PEEP:  [5 cmH20] 5 cmH20 Pressure Support:  [10 cmH20] 10 cmH20 Plateau Pressure:  [20 cmH20-22 cmH20] 20 cmH20  INTAKE / OUTPUT: I/O last 3 completed shifts: In: 3194.4 [I.V.:1179.4; NG/GT:1515; IV Piggyback:500] Out: 3600 [Urine:3600]   PHYSICAL EXAMINATION: General: awake on vent HENT: NCAT hard collar and ETT in place PULM: RLL decreased BS, otherwise clear, Vt 320 and then 550 with encouragement  CV: RRR, no mgr GI: BS+, soft, nontender Neuro: awake on vent, moves all four ext, following commands Derm: no rash or skin breakdown  LABS:  BMET  Recent Labs Lab 07/14/16 0539 07/15/16 0831 07/16/16 0518  NA 142 140 141  K 3.7 4.7 3.7  CL 101 101 98*  CO2 29 33* 35*  BUN 23* 22* 30*  CREATININE 0.70 0.75 0.85  GLUCOSE 104* 125* 112*    Electrolytes  Recent Labs Lab 07/13/16 1623 07/14/16 0539 07/14/16 1640 07/15/16 0831 07/16/16 0518  CALCIUM  --  8.3*  --  8.3* 8.7*  MG 1.8 1.8 2.0  --   --   PHOS 3.1 3.2 3.8  --   --     CBC  Recent Labs Lab 07/14/16 0539 07/16/16 0518 07/17/16 0633  WBC 11.7* 12.3* 15.5*  HGB 12.1* 12.5* 12.6*  HCT 39.2 39.6 40.8  PLT 269 300 304     Coag's  Recent Labs Lab 07/15/16 0831 07/16/16 0323 07/17/16 0633  INR 1.25 1.23 1.29    Sepsis Markers  Recent Labs Lab 07/12/16 1841 07/12/16 1854  LATICACIDVEN  --  1.2  PROCALCITON 0.15  --     ABG  Recent Labs Lab 07/12/16 1828 07/13/16 0326  PHART 7.529* 7.492*  PCO2ART 43.3 46.1*  PO2ART 104.0* 86.0    Liver Enzymes  Recent Labs Lab 07/12/16 1841  AST 15  ALT 14*  ALKPHOS 46  BILITOT 0.6  ALBUMIN 2.6*    Cardiac Enzymes  Recent Labs Lab 07/12/16 1841 07/12/16 2305 07/13/16 0725  TROPONINI 0.06* <0.03 <0.03    Glucose  Recent Labs Lab 07/16/16 0354 07/16/16 0811 07/16/16 1213 07/16/16 1604 07/16/16 1910 07/16/16 2334  GLUCAP 102* 111* 110* 104* 99 106*    Imaging Dg Chest Port 1 View  Result Date: 07/17/2016 CLINICAL DATA:  Acute respiratory failure. EXAM: PORTABLE CHEST 1 VIEW COMPARISON:  Radiograph of July 16, 2016. FINDINGS: Stable cardiomegaly. Endotracheal and nasogastric tubes are unchanged in position. No pneumothorax is noted. Stable right basilar opacity is noted concerning for atelectasis with minimal associated pleural effusion. Left basilar subsegmental atelectasis is noted as well. Bony  thorax is unremarkable. IMPRESSION: Stable support apparatus. Stable bibasilar opacities as described above. Electronically Signed   By: Lupita Raider, M.D.   On: 07/17/2016 07:53     STUDIES:  CT head 8/2 > displaced fx of C1 along with fx of C6  CULTURES: Sputum 8/3 > few GNR, nl flora Gonorrhea 8/3 >neg Chlamydia 8/3 >neg  ANTIBIOTICS: Unasyn 8/3 >  SIGNIFICANT EVENTS: 8/2 > admitted at [facility] with C1 and C6 fx, intubated for resp insufficiency 8/3 > transferred to [facility] ICU  LINES/TUBES: ETT 8/3 >  DISCUSSION: 82 y.o. M who suffered mechanical fall 8/1 and sustained C1 and C6 fx's.  He required intubation due to concern for airway compromise 8/3 and was later transferred to [facility] for further evaluation and  management. Maybe ready for trial extubation 8/8.   ASSESSMENT / PLAN:  NEUROLOGIC A:   Acute encephalopathy > improving Displaced C1 and C6 fx's - s/p mechanical fall Seen by neurosurgery, no role for surgery.   P:   Sedation: fentanyl PRN RASS goal: 0  WUA daily,MAE x4, follows commandst Continue C collar and follow up in 6 weeks  PULMONARY A: Acute respiratory failure with hypoxemia > improving Aspiration pneumonia RLL Collapse/atelectasis RLL Atelectasis, collapse resolved rt / asp P:   Bag lavage with saline q shift > secretions improved 8/8 Follow CXR PSV as tolerated 07/17/2016 Vt remain low(320) but increase with verbal encouragement. May need to extubate outside normal parameters VAP prevention Lasix x 1 to facilitate weaning  CARDIOVASCULAR A:  Hx HTN, HLD, DVT on coumadin P:  Monitor hemodynamics Continue outpatient atorvastatin Hold outpatient lisinopril, niacin OK to restart anticoag per NSGY on 8/6(on heparin drip)  RENAL Lab Results  Component Value Date   CREATININE 0.85 07/16/2016   CREATININE 0.75 07/15/2016   CREATININE 0.70 07/14/2016    A:   No acute issues P:   Monitor BMET and UOP Replace electrolytes as needed Lasix x 1 8/8   GASTROINTESTINAL A:   GI prophylaxis Nutrition P:   SUP: Pantoprazole Continue TF  HEMATOLOGIC A:   On chronic coumadin for chronic DVT VTE Prophylaxis P:  NSGY Ok with restart anticoagulation, restarted 8/6 Follow CBC  INFECTIOUS A:   Aspiration pneumonia RLL  Penile discharge P:   Abx as above (unasyn) Follow cultures  ENDOCRINE CBG (last 3)   Recent Labs  07/16/16 1604 07/16/16 1910 07/16/16 2334  GLUCAP 104* 99 106*     A:   At risk hyperglycemia   P:   Continue ssi  Family updated: spoke with the patient's son at bedside on 8/7  Interdisciplinary Family Meeting v Palliative Care Meeting:  Due by: 07/19/16  Brett Canales Minor ACNP Adolph Pollack PCCM Pager [phone] till 3 pm If no  answer page [phone] 07/17/2016, 9:02 AM  STAFF NOTE: I, Rory Percy, MD FACP have personally reviewed patient's available data, including medical history, events of note, physical examination and test results as part of my evaluation. I have discussed with resident/NP and other care providers such as pharmacist, RN and RRT. In addition, I personally evaluated patient and elicited key findings of: awake, follows commands, collar in place, moves all ext, less coarse BS on rt, pcxr improved aeration, noted on cpap 5 ps 10 with volumes that were acceptable, I lowered PS to 5, noted slight lower volumes but unchanged rate / MV overall, continued wean cpap 5 ps5, goal 1 hr, would assess abg on this wean given lower volumes 250 for his height,  he has strong cough, would use unasyn x 7 days, remains culture neg, would add lasix to neg balance, follow up pcxr The patient is critically ill with multiple organ systems failure and requires high complexity decision making for assessment and support, frequent evaluation and titration of therapies, application of advanced monitoring technologies and extensive interpretation of multiple databases.   Critical Care Time devoted to patient care services described in this note is 30 Minutes. This time reflects time of care of this signee: Rory Percyaniel Feinstein, MD FACP. This critical care time does not reflect procedure time, or teaching time or supervisory time of PA/NP/Med student/Med Resident etc but could involve care discussion time. Rest per NP/medical resident whose note is outlined above and that I agree with   Mcarthur Rossettianiel J. Tyson AliasFeinstein, MD, FACP Pgr: [phone] [facility] 07/17/2016 9:48 AM

## 2016-07-17 NOTE — Procedures (Signed)
Intubation Procedure Note Allen Hamilton [id number] 1933/11/24  Procedure: Intubation Indications: Respiratory insufficiency  Procedure Details Consent: Unable to obtain consent because of emergent medical necessity. Time Out: Verified patient identification, verified procedure, site/side was marked, verified correct patient position, special equipment/implants available, medications/allergies/relevent history reviewed, required imaging and test results available.  Performed  Maximum sterile technique was used including gown, hand hygiene and mask.  MAC and 4 Glide  NO neck movement at all Collar on   Evaluation Hemodynamic Status: BP stable throughout; O2 sats: transiently fell during during procedure Patient's Current Condition: stable Complications: No apparent complications Patient did tolerate procedure well. Chest X-ray ordered to verify placement.  CXR: pending.   Nelda BucksFEINSTEIN,DANIEL J. 07/17/2016

## 2016-07-17 NOTE — Progress Notes (Signed)
SLP Cancellation Note  Patient Details Name: Allen Hamilton MRN: [id number] DOB: Apr 23, 1933   Cancelled treatment:       Reason Eval/Treat Not Completed: Patient not medically ready. Orders received for swallow evaluation. Per chart review, pt was emergently reintubated post-extubation. Will continue to follow for readiness.   Maxcine Hamaiewonsky, Hiren Peplinski 07/17/2016, 12:43 PM  Maxcine HamLaura Paiewonsky, M.A. CCC-SLP [phone]

## 2016-07-17 NOTE — Progress Notes (Signed)
ANTICOAGULATION CONSULT NOTE - f/u Consult  Pharmacy Consult for Warfarin/Heparin. Indication: Chronic warfarin PTA for prior DVT.  Allergies  Allergen Reactions  . Hydromorphone     Respiratory depression    Patient Measurements: Height: 5\' 10"  (177.8 cm) Weight: 250 lb (113.4 kg) IBW/kg (Calculated) : 73 Heparin Dosing Weight: 98.6  Vital Signs: Temp: 98.6 F (37 C) (08/08 0812) Temp Source: Oral (08/08 0812) BP: 130/67 (08/08 0812) Pulse Rate: 67 (08/08 0812)  Labs:  Recent Labs  07/15/16 0831 07/15/16 2035 07/16/16 0323 07/16/16 0518 07/17/16 0633  HGB  --   --   --  12.5* 12.6*  HCT  --   --   --  39.6 40.8  PLT  --   --   --  300 304  LABPROT 15.7*  --  15.6*  --  16.2*  INR 1.25  --  1.23  --  1.29  HEPARINUNFRC  --  0.30 0.50  --  0.55  CREATININE 0.75  --   --  0.85  --     Estimated Creatinine Clearance: 87.5 mL/min (by C-G formula based on SCr of 0.85 mg/dL).   Assessment:  82 yo male with Hx DVTs on chronic coumadin PTA. Coumadin was held warf for cervical fx. No procedures plans and pharmacy consulted to restart coumadin and heparin  -Heparin level= 0.55, INR= 1.29  **PTA warfarin dose: 8 mg Mon/Thurs, 9 mg ROW  Goal of Therapy:  INR 2-3 Monitor platelets by anticoagulation protocol: Yes    Plan:  -No heparin changes needed -Coumadin 12.5mg  po today -Daily INR, heparin level and CBC  Harland GermanAndrew Jamey Demchak, Pharm D 07/17/2016 10:05 AM

## 2016-07-17 NOTE — Progress Notes (Signed)
Pt was emergently reintubated due to increased need for 100% O2, unable to protect airway and copious amounts of oral secretions, decreased O2 sats.  Pt was on 100% NRB within minutes of extubation.  Pt reintubated by Dr. Tyson AliasFeinstein and bronchoscopy confirmed to clean out lungs and specimen obtained and sent to lab for analysis.  Pt placed back on original vent settings but with O2 at 100%.

## 2016-07-17 NOTE — Procedures (Signed)
Bronchoscopy Procedure Note Allen Hamilton [id number] 03/01/1933  Procedure: Bronchoscopy Indications: Obtain specimens for culture and/or other diagnostic studies and Remove secretions  Procedure Details Consent: Unable to obtain consent because of emergent medical necessity. Time Out: Verified patient identification, verified procedure, site/side was marked, verified correct patient position, special equipment/implants available, medications/allergies/relevent history reviewed, required imaging and test results available.  Performed  In preparation for procedure, patient was given 100% FiO2 and bronchoscope lubricated. Sedation: Etomidate  Airway entered and the following bronchi were examined: RUL, RML, RLL and Bronchi.   Procedures performed: Brushings performed- no Bronchoscope removed.  , Patient placed back on 100% FiO2 at conclusion of procedure.    Evaluation Hemodynamic Status: BP stable throughout; O2 sats: stable throughout Patient's Current Condition: stable Specimens:  Sent serosanguinous fluid Complications: No apparent complications Patient did tolerate procedure well.   Nelda BucksFEINSTEIN,Allen Hughson J. 07/17/2016   1. Placed through new ETT to confirm placement, noted in good placement 2. Lavage RLL, RML, no sig distal plusg or obstruction  3. BAL RLL, serous  Mcarthur Rossettianiel J. Allen AliasFeinstein, MD, FACP Pgr: [phone] [place] [facility]

## 2016-07-17 NOTE — Procedures (Signed)
Extubation Procedure Note  Patient Details:   Name: Nunzio Cobbsaul G Garraway DOB: 1933/09/01 MRN: [id number]   Airway Documentation:  Airway 8 mm (Active)  Secured at (cm) 23 cm 07/12/2016 12:00 AM    Evaluation  O2 sats: stable throughout Complications: No apparent complications Patient did tolerate procedure well. Bilateral Breath Sounds: Rhonchi   Yes  Richmond CampbellHall, Guled Gahan Lynn 07/17/2016, 12:36 PM

## 2016-07-18 ENCOUNTER — Inpatient Hospital Stay (HOSPITAL_COMMUNITY): Payer: Medicare Other

## 2016-07-18 ENCOUNTER — Encounter (HOSPITAL_COMMUNITY)
Admission: AD | Disposition: A | Discharge status: Nursing Home (Includes ICF) | Payer: Self-pay | Source: Other Acute Inpatient Hospital | Attending: Internal Medicine

## 2016-07-18 ENCOUNTER — Encounter (HOSPITAL_COMMUNITY): Payer: Self-pay | Admitting: Cardiology

## 2016-07-18 ENCOUNTER — Ambulatory Visit: Payer: Self-pay | Admitting: Otolaryngology

## 2016-07-18 DIAGNOSIS — J9819 Other pulmonary collapse: Secondary | ICD-10-CM

## 2016-07-18 LAB — CBC
HEMATOCRIT: 41.2 % (ref 39.0–52.0)
HEMOGLOBIN: 13.2 g/dL (ref 13.0–17.0)
MCH: 30.8 pg (ref 26.0–34.0)
MCHC: 32 g/dL (ref 30.0–36.0)
MCV: 96 fL (ref 78.0–100.0)
Platelets: 297 10*3/uL (ref 150–400)
RBC: 4.29 MIL/uL (ref 4.22–5.81)
RDW: 14.3 % (ref 11.5–15.5)
WBC: 17.8 10*3/uL — AB (ref 4.0–10.5)

## 2016-07-18 LAB — GLUCOSE, CAPILLARY
GLUCOSE-CAPILLARY: 100 mg/dL — AB (ref 65–99)
Glucose-Capillary: 102 mg/dL — ABNORMAL HIGH (ref 65–99)
Glucose-Capillary: 106 mg/dL — ABNORMAL HIGH (ref 65–99)
Glucose-Capillary: 96 mg/dL (ref 65–99)
Glucose-Capillary: 115 mg/dL — ABNORMAL HIGH (ref 65–99)
Glucose-Capillary: 99 mg/dL (ref 65–99)

## 2016-07-18 LAB — HEPARIN LEVEL (UNFRACTIONATED): HEPARIN UNFRACTIONATED: 0.63 [IU]/mL (ref 0.30–0.70)

## 2016-07-18 LAB — BASIC METABOLIC PANEL
ANION GAP: 9 (ref 5–15)
BUN: 34 mg/dL — ABNORMAL HIGH (ref 6–20)
CHLORIDE: 100 mmol/L — AB (ref 101–111)
CO2: 32 mmol/L (ref 22–32)
Calcium: 8.8 mg/dL — ABNORMAL LOW (ref 8.9–10.3)
Creatinine, Ser: 0.86 mg/dL (ref 0.61–1.24)
GFR calc non Af Amer: >60 mL/min (ref >60)
GLUCOSE: 103 mg/dL — AB (ref 65–99)
Potassium: 4 mmol/L (ref 3.5–5.1)
Sodium: 141 mmol/L (ref 135–145)

## 2016-07-18 LAB — BLOOD GAS, ARTERIAL
Acid-Base Excess: 10.8 mmol/L — ABNORMAL HIGH (ref 0.0–2.0)
BICARBONATE: 35 meq/L — AB (ref 20.0–24.0)
DRAWN BY: 44956
FIO2: 0.4
MECHVT: 510 mL
O2 SAT: 94.3 %
PEEP: 5 cmH2O
PH ART: 7.455 — AB (ref 7.350–7.450)
Patient temperature: 100.5
RATE: 14 resp/min
TCO2: 36.5 mmol/L (ref 0–100)
pCO2 arterial: 51.2 mmHg — ABNORMAL HIGH (ref 35.0–45.0)
pO2, Arterial: 74.1 mmHg — ABNORMAL LOW (ref 80.0–100.0)

## 2016-07-18 LAB — TRIGLYCERIDES: TRIGLYCERIDES: 85 mg/dL (ref <150)

## 2016-07-18 LAB — PROTIME-INR
INR: 1.32
Prothrombin Time: 16.5 seconds — ABNORMAL HIGH (ref 11.4–15.2)

## 2016-07-18 SURGERY — CREATION, TRACHEOSTOMY
Anesthesia: General

## 2016-07-18 MED ORDER — FUROSEMIDE 10 MG/ML IJ SOLN
40.0000 mg | Freq: Two times a day (BID) | INTRAMUSCULAR | Status: AC
  Administered 2016-07-18 – 2016-07-19 (×4): 40 mg via INTRAVENOUS
  Filled 2016-07-18 (×4): qty 4

## 2016-07-18 MED ORDER — HEPARIN (PORCINE) IN NACL 100-0.45 UNIT/ML-% IJ SOLN
1750.0000 [IU]/h | INTRAMUSCULAR | Status: AC
  Administered 2016-07-18 (×2): 1750 [IU]/h via INTRAVENOUS
  Filled 2016-07-18: qty 250

## 2016-07-18 MED ORDER — WARFARIN SODIUM 7.5 MG PO TABS: 12.5000 mg | ORAL_TABLET | Freq: Once | ORAL | Status: DC

## 2016-07-18 MED ORDER — WARFARIN - PHARMACIST DOSING INPATIENT
Freq: Every day | Status: DC
  Administered 2016-07-19 – 2016-07-24 (×5): 1

## 2016-07-18 NOTE — Progress Notes (Addendum)
ANTICOAGULATION CONSULT NOTE - f/u Consult  Pharmacy Consult for Warfarin/Heparin. Indication: Chronic warfarin PTA for prior DVT.  Allergies  Allergen Reactions  . Hydromorphone     Respiratory depression    Patient Measurements: Height: 5\' 10"  (177.8 cm) Weight: 245 lb 13 oz (111.5 kg) IBW/kg (Calculated) : 73 Heparin Dosing Weight: 98.6  Vital Signs: Temp: 100.4 F (38 C) (08/09 1119) Temp Source: Oral (08/09 1119) BP: 127/58 (08/09 1200) Pulse Rate: 62 (08/09 1200)  Labs:  Recent Labs  07/16/16 0323  07/16/16 0518 07/17/16 0633 07/18/16 0250  HGB  --   < > 12.5* 12.6* 13.2  HCT  --   --  39.6 40.8 41.2  PLT  --   --  300 304 297  LABPROT 15.6*  --   --  16.2* 16.5*  INR 1.23  --   --  1.29 1.32  HEPARINUNFRC 0.50  --   --  0.55 0.63  CREATININE  --   --  0.85  --  0.86  < > = values in this interval not displayed.  Estimated Creatinine Clearance: 85.7 mL/min (by C-G formula based on SCr of 0.86 mg/dL).   Assessment:  82 yo male with Hx DVTs on chronic coumadin PTA. Coumadin was held warf for cervical fx. No procedures plans and pharmacy consulted to restart coumadin and heparin. Heparin was stopped this am for trach procedure but this has been rescheduled for 7:30am on 8/10 -INR= 1.32  **PTA warfarin dose: 8 mg Mon/Thurs, 9 mg ROW  Goal of Therapy:  INR 2-3 Monitor platelets by anticoagulation protocol: Yes    Plan:  -Restart heparin at 1750 units/hr (stop at 0500 on 8/10) -No heparin level as heparin will be off in am -hold coumadin today -Daily INR, CBC  Harland GermanAndrew Valjean Ruppel, Pharm D 07/18/2016 1:45 PM    Harland GermanAndrew Tamsyn Owusu, Pharm D 07/18/2016 1:36 PM

## 2016-07-18 NOTE — Progress Notes (Signed)
PT Cancellation Note  Patient Details Name: Allen Hamilton MRN: [id number] DOB: 08-14-1933   Cancelled Treatment:    Reason Eval/Treat Not Completed: Medical issues which prohibited therapy (Pt intubated just after order written. Sedated. Sign off) Nurse in agreement.  MD:  Please reorder when pt is appropriate.  Thanks.    Tawni MillersWhite, [name] 07/18/2016, 8:44 AM Eber Jonesawn Luverna Degenhart,PT Acute Rehabilitation [phone] (pager)

## 2016-07-18 NOTE — Care Management Important Message (Signed)
Important Message  Patient Details  Name: Allen Hamilton MRN: [id number] Date of Birth: 08/24/1933   Medicare Important Message Given:  Yes    Davette Nugent, Stephan MinisterSusan Coleman 07/18/2016, 2:50 PM

## 2016-07-18 NOTE — Anesthesia Preprocedure Evaluation (Addendum)
Anesthesia Evaluation  Patient identified by MRN, date of birth, ID band Patient unresponsive    Reviewed: Allergy & Precautions, H&P , NPO status , Patient's Chart, lab work & pertinent test results  Airway Mallampati: Intubated   Neck ROM: Limited    Dental no notable dental hx.    Pulmonary neg pulmonary ROS,  resp failure   Pulmonary exam normal breath sounds clear to auscultation       Cardiovascular hypertension, On Medications + Peripheral Vascular Disease  negative cardio ROS   Rhythm:Regular Rate:Tachycardia     Neuro/Psych C1 fracture negative neurological ROS  negative psych ROS   GI/Hepatic negative GI ROS, Neg liver ROS,   Endo/Other  negative endocrine ROS  Renal/GU negative Renal ROS  negative genitourinary   Musculoskeletal   Abdominal   Peds  Hematology negative hematology ROS (+)   Anesthesia Other Findings   Reproductive/Obstetrics negative OB ROS                            Anesthesia Physical Anesthesia Plan  ASA: IV  Anesthesia Plan: General   Post-op Pain Management:    Induction: Intravenous  Airway Management Planned: Oral ETT  Additional Equipment:   Intra-op Plan:   Post-operative Plan: Post-operative intubation/ventilation  Informed Consent: I have reviewed the patients History and Physical, chart, labs and discussed the procedure including the risks, benefits and alternatives for the proposed anesthesia with the patient or authorized representative who has indicated his/her understanding and acceptance.   Dental advisory given  Plan Discussed with: CRNA  Anesthesia Plan Comments:         Anesthesia Quick Evaluation

## 2016-07-18 NOTE — Consult Note (Signed)
Reason for Consult: Tracheostomy Referring Physician: Raylene Miyamoto, MD  Allen Hamilton is an 82 y.o. male.  HPI: Trauma 1 week ago, cervical spine fractures, no neurologic injury, intubated for 6 days, attempted extubation yesterday with failure. Request made for tracheostomy consultation.  No past medical history on file.  No past surgical history on file.  No family history on file.  Social History:  has no tobacco, alcohol, and drug history on file.  Allergies:  Allergies  Allergen Reactions  . Hydromorphone     Respiratory depression    Medications: Reviewed  Results for orders placed or performed during the hospital encounter of 07/12/16 (from the past 48 hour(s))  Glucose, capillary     Status: Abnormal   Collection Time: 07/16/16 12:13 PM  Result Value Ref Range   Glucose-Capillary 110 (H) 65 - 99 mg/dL   Comment 1 Capillary Specimen    Comment 2 Notify RN   Glucose, capillary     Status: Abnormal   Collection Time: 07/16/16  4:04 PM  Result Value Ref Range   Glucose-Capillary 104 (H) 65 - 99 mg/dL   Comment 1 Capillary Specimen    Comment 2 Notify RN   Glucose, capillary     Status: None   Collection Time: 07/16/16  7:10 PM  Result Value Ref Range   Glucose-Capillary 99 65 - 99 mg/dL   Comment 1 Capillary Specimen    Comment 2 Notify RN    Comment 3 Document in Chart   Glucose, capillary     Status: Abnormal   Collection Time: 07/16/16 11:34 PM  Result Value Ref Range   Glucose-Capillary 106 (H) 65 - 99 mg/dL   Comment 1 Capillary Specimen    Comment 2 Notify RN    Comment 3 Document in Chart   Protime-INR     Status: Abnormal   Collection Time: 07/17/16  6:33 AM  Result Value Ref Range   Prothrombin Time 16.2 (H) 11.4 - 15.2 seconds   INR 1.29   CBC     Status: Abnormal   Collection Time: 07/17/16  6:33 AM  Result Value Ref Range   WBC 15.5 (H) 4.0 - 10.5 K/uL   RBC 4.25 4.22 - 5.81 MIL/uL   Hemoglobin 12.6 (L) 13.0 - 17.0 g/dL   HCT 40.8  39.0 - 52.0 %   MCV 96.0 78.0 - 100.0 fL   MCH 29.6 26.0 - 34.0 pg   MCHC 30.9 30.0 - 36.0 g/dL   RDW 14.2 11.5 - 15.5 %   Platelets 304 150 - 400 K/uL  Heparin level (unfractionated)     Status: None   Collection Time: 07/17/16  6:33 AM  Result Value Ref Range   Heparin Unfractionated 0.55 0.30 - 0.70 IU/mL    Comment:        IF HEPARIN RESULTS ARE BELOW EXPECTED VALUES, AND PATIENT DOSAGE HAS BEEN CONFIRMED, SUGGEST FOLLOW UP TESTING OF ANTITHROMBIN III LEVELS.   Glucose, capillary     Status: Abnormal   Collection Time: 07/17/16  8:33 AM  Result Value Ref Range   Glucose-Capillary 115 (H) 65 - 99 mg/dL  I-STAT 3, arterial blood gas (G3+)     Status: Abnormal   Collection Time: 07/17/16 10:34 AM  Result Value Ref Range   pH, Arterial 7.384 7.350 - 7.450   pCO2 arterial 59.7 (HH) 35.0 - 45.0 mmHg   pO2, Arterial 77.0 (L) 80.0 - 100.0 mmHg   Bicarbonate 35.6 (H) 20.0 - 24.0 mEq/L  TCO2 37 0 - 100 mmol/L   O2 Saturation 94.0 %   Acid-Base Excess 9.0 (H) 0.0 - 2.0 mmol/L   Patient temperature 98.6 F    Collection site RADIAL, ALLEN'S TEST ACCEPTABLE    Drawn by Operator    Sample type ARTERIAL    Comment NOTIFIED PHYSICIAN   Culture, respiratory (NON-Expectorated)     Status: None (Preliminary result)   Collection Time: 07/17/16 11:50 AM  Result Value Ref Range   Specimen Description TRACHEAL ASPIRATE    Special Requests NONE    Gram Stain      ABUNDANT WBC PRESENT, PREDOMINANTLY PMN RARE SQUAMOUS EPITHELIAL CELLS PRESENT FEW GRAM POSITIVE COCCI    Culture PENDING    Report Status PENDING   Glucose, capillary     Status: Abnormal   Collection Time: 07/17/16 12:34 PM  Result Value Ref Range   Glucose-Capillary 122 (H) 65 - 99 mg/dL  Glucose, capillary     Status: Abnormal   Collection Time: 07/17/16  3:47 PM  Result Value Ref Range   Glucose-Capillary 108 (H) 65 - 99 mg/dL   Comment 1 Capillary Specimen   Glucose, capillary     Status: None   Collection Time:  07/17/16  7:49 PM  Result Value Ref Range   Glucose-Capillary 96 65 - 99 mg/dL   Comment 1 Capillary Specimen    Comment 2 Notify RN    Comment 3 Document in Chart   Protime-INR     Status: Abnormal   Collection Time: 07/18/16  2:50 AM  Result Value Ref Range   Prothrombin Time 16.5 (H) 11.4 - 15.2 seconds   INR 1.32   Heparin level (unfractionated)     Status: None   Collection Time: 07/18/16  2:50 AM  Result Value Ref Range   Heparin Unfractionated 0.63 0.30 - 0.70 IU/mL    Comment:        IF HEPARIN RESULTS ARE BELOW EXPECTED VALUES, AND PATIENT DOSAGE HAS BEEN CONFIRMED, SUGGEST FOLLOW UP TESTING OF ANTITHROMBIN III LEVELS.   CBC     Status: Abnormal   Collection Time: 07/18/16  2:50 AM  Result Value Ref Range   WBC 17.8 (H) 4.0 - 10.5 K/uL   RBC 4.29 4.22 - 5.81 MIL/uL   Hemoglobin 13.2 13.0 - 17.0 g/dL   HCT 41.2 39.0 - 52.0 %   MCV 96.0 78.0 - 100.0 fL   MCH 30.8 26.0 - 34.0 pg   MCHC 32.0 30.0 - 36.0 g/dL   RDW 14.3 11.5 - 15.5 %   Platelets 297 150 - 400 K/uL  Basic metabolic panel     Status: Abnormal   Collection Time: 07/18/16  2:50 AM  Result Value Ref Range   Sodium 141 135 - 145 mmol/L   Potassium 4.0 3.5 - 5.1 mmol/L   Chloride 100 (L) 101 - 111 mmol/L   CO2 32 22 - 32 mmol/L   Glucose, Bld 103 (H) 65 - 99 mg/dL   BUN 34 (H) 6 - 20 mg/dL   Creatinine, Ser 0.86 0.61 - 1.24 mg/dL   Calcium 8.8 (L) 8.9 - 10.3 mg/dL   GFR calc non Af Amer >60 >60 mL/min   GFR calc Af Amer >60 >60 mL/min    Comment: (NOTE) The eGFR has been calculated using the CKD EPI equation. This calculation has not been validated in all clinical situations. eGFR's persistently <60 mL/min signify possible Chronic Kidney Disease.    Anion gap 9 5 - 15  Blood gas, arterial     Status: Abnormal   Collection Time: 07/18/16  3:40 AM  Result Value Ref Range   FIO2 0.40    Delivery systems VENTILATOR    Mode PRESSURE REGULATED VOLUME CONTROL    VT 510 mL   LHR 14 resp/min    Peep/cpap 5.0 cm H20   pH, Arterial 7.455 (H) 7.350 - 7.450   pCO2 arterial 51.2 (H) 35.0 - 45.0 mmHg   pO2, Arterial 74.1 (L) 80.0 - 100.0 mmHg   Bicarbonate 35.0 (H) 20.0 - 24.0 mEq/L   TCO2 36.5 0 - 100 mmol/L   Acid-Base Excess 10.8 (H) 0.0 - 2.0 mmol/L   O2 Saturation 94.3 %   Patient temperature 100.5    Collection site RIGHT RADIAL    Drawn by [phone]    Sample type ARTERIAL DRAW    Allens test (pass/fail) PASS PASS    Dg Chest Port 1 View  Result Date: 07/18/2016 CLINICAL DATA:  Intubation. EXAM: PORTABLE CHEST 1 VIEW COMPARISON:  07/17/2016. FINDINGS: Endotracheal tube and NG tube in stable position. Cardiomegaly mild bilateral from interstitial prominence and pleural effusions suggesting congestive heart failure. Low lung volumes with bibasilar atelectasis . No pneumothorax. IMPRESSION: 1. Lines and tubes in stable position. 2. Cardiomegaly with mild increased interstitial markings consistent with interstitial edema. Small bilateral effusions. 3. Low lung volumes with basilar atelectasis . Electronically Signed   By: Marcello Moores  Register   On: 07/18/2016 07:12   Dg Chest Port 1 View  Result Date: 07/17/2016 CLINICAL DATA:  Pneumonia EXAM: PORTABLE CHEST 1 VIEW COMPARISON:  07/17/2016 FINDINGS: Cardiomediastinal silhouette is stable. Persistent small right pleural effusion with right basilar atelectasis or infiltrate. Left basilar atelectasis stable. No pulmonary edema. Endotracheal tube is unchanged in position with tip 2.5 cm above the carina. Stable NG tube position. IMPRESSION: Stable support apparatus. Persistent small right pleural effusion with right basilar atelectasis or infiltrate. Left basilar atelectasis. No pulmonary edema. Electronically Signed   By: Lahoma Crocker M.D.   On: 07/17/2016 13:27   Dg Chest Port 1 View  Result Date: 07/17/2016 CLINICAL DATA:  Acute respiratory failure. EXAM: PORTABLE CHEST 1 VIEW COMPARISON:  Radiograph of July 16, 2016. FINDINGS: Stable cardiomegaly.  Endotracheal and nasogastric tubes are unchanged in position. No pneumothorax is noted. Stable right basilar opacity is noted concerning for atelectasis with minimal associated pleural effusion. Left basilar subsegmental atelectasis is noted as well. Bony thorax is unremarkable. IMPRESSION: Stable support apparatus. Stable bibasilar opacities as described above. Electronically Signed   By: Marijo Conception, M.D.   On: 07/17/2016 07:53    IWL:[id number] except as listed in admit H&P  Blood pressure 132/71, pulse 75, temperature (!) 100.6 F (38.1 C), temperature source Axillary, resp. rate 14, height 5' 10"  (1.778 m), weight 111.5 kg (245 lb 13 oz), SpO2 96 %.  PHYSICAL EXAM: Overall appearance:  Sedated on the ventilator Head:  Normocephalic, atraumatic. Oral Cavity/Pharynx:  Oral endotracheal tube in place. Larynx/Hypopharynx: Deferred Neuro:  Sedated on the ventilator. Neck: No palpable neck masses. Cervical collar in place. Low riding thyroid notch. No masses palpable.  Studies Reviewed: none  Procedures: none   Assessment/Plan: Chart reviewed, examination complete. Discussed with the patient's wife and daughter. With the recommendation for tracheostomy. We will try to do this later today or at the latest tomorrow. Risks and benefits were discussed in detail. All questions were answered.  Zenna Traister 07/18/2016, 10:00 AM

## 2016-07-18 NOTE — Progress Notes (Signed)
PULMONARY / CRITICAL CARE MEDICINE   Name: Allen Hamilton MRN: [id number] DOB: 06/10/1933    ADMISSION DATE:  07/12/2016 CONSULTATION DATE:  07/12/16  REFERRING Allen Hamilton:  [facility]  CHIEF COMPLAINT:  C1 fracture s/p fall  BRIEF: 82 y/o male with HTN, HLD and DVT on coumadin here with a C1 fracture post fall, intubated but neurologically intact, has RLL aspiration pneumonia and mucus plugging.   SUBJECTIVE:  Heavily sedated  VITAL SIGNS: BP (!) 107/56   Pulse (!) 55   Temp (!) 100.6 F (38.1 C) (Axillary)   Resp 14   Ht  (1.778 m)   Wt 245 lb 13 oz (111.5 kg)   SpO2 97%   BMI 35.27 kg/m   HEMODYNAMICS:    VENTILATOR SETTINGS: Vent Mode: PRVC FiO2 (%):  [40 %-100 %] 40 % Set Rate:  [14 bmp] 14 bmp Vt Set:  [510 mL] 510 mL PEEP:  [5 cmH20] 5 cmH20 Pressure Support:  [5 cmH20] 5 cmH20 Plateau Pressure:  [10 cmH20-22 cmH20] 20 cmH20  INTAKE / OUTPUT: I/O last 3 completed shifts: In: 3048.5 [I.V.:1338.5; NG/GT:1110; IV Piggyback:600] Out: 2860 [Urine:2860]   PHYSICAL EXAMINATION: General: sedated  on vent HENT: NCAT hard collar and ETT in place PULM:decreased bs bases  CV: RRR, no mgr GI: BS+, soft, nontender Neuro: heavily sedated on diprivan Derm: no rash or skin breakdown  LABS:  BMET  Recent Labs Lab 07/15/16 0831 07/16/16 0518 07/18/16 0250  NA 140 141 141  K 4.7 3.7 4.0  CL 101 98* 100*  CO2 33* 35* 32  BUN 22* 30* 34*  CREATININE 0.75 0.85 0.86  GLUCOSE 125* 112* 103*    Electrolytes  Recent Labs Lab 07/13/16 1623 07/14/16 0539 07/14/16 1640 07/15/16 0831 07/16/16 0518 07/18/16 0250  CALCIUM  --  8.3*  --  8.3* 8.7* 8.8*  MG 1.8 1.8 2.0  --   --   --   PHOS 3.1 3.2 3.8  --   --   --     CBC  Recent Labs Lab 07/16/16 0518 07/17/16 0633 07/18/16 0250  WBC 12.3* 15.5* 17.8*  HGB 12.5* 12.6* 13.2  HCT 39.6 40.8 41.2  PLT 300 304 297    Coag's  Recent Labs Lab 07/16/16 0323 07/17/16 0633 07/18/16 0250  INR  1.23 1.29 1.32    Sepsis Markers  Recent Labs Lab 07/12/16 1841 07/12/16 1854  LATICACIDVEN  --  1.2  PROCALCITON 0.15  --     ABG  Recent Labs Lab 07/13/16 0326 07/17/16 1034 07/18/16 0340  PHART 7.492* 7.384 7.455*  PCO2ART 46.1* 59.7* 51.2*  PO2ART 86.0 77.0* 74.1*    Liver Enzymes  Recent Labs Lab 07/12/16 1841  AST 15  ALT 14*  ALKPHOS 46  BILITOT 0.6  ALBUMIN 2.6*    Cardiac Enzymes  Recent Labs Lab 07/12/16 1841 07/12/16 2305 07/13/16 0725  TROPONINI 0.06* <0.03 <0.03    Glucose  Recent Labs Lab 07/16/16 1910 07/16/16 2334 07/17/16 0833 07/17/16 1234 07/17/16 1547 07/17/16 1949  GLUCAP 99 106* 115* 122* 108* 96    Imaging Dg Chest Port 1 View  Result Date: 07/18/2016 CLINICAL DATA:  Intubation. EXAM: PORTABLE CHEST 1 VIEW COMPARISON:  07/17/2016. FINDINGS: Endotracheal tube and NG tube in stable position. Cardiomegaly mild bilateral from interstitial prominence and pleural effusions suggesting congestive heart failure. Low lung volumes with bibasilar atelectasis . No pneumothorax. IMPRESSION: 1. Lines and tubes in stable position. 2. Cardiomegaly with mild increased interstitial markings consistent  with interstitial edema. Small bilateral effusions. 3. Low lung volumes with basilar atelectasis . Electronically Signed   By: Maisie Fushomas  Register   On: 07/18/2016 07:12   Dg Chest Port 1 View  Result Date: 07/17/2016 CLINICAL DATA:  Pneumonia EXAM: PORTABLE CHEST 1 VIEW COMPARISON:  07/17/2016 FINDINGS: Cardiomediastinal silhouette is stable. Persistent small right pleural effusion with right basilar atelectasis or infiltrate. Left basilar atelectasis stable. No pulmonary edema. Endotracheal tube is unchanged in position with tip 2.5 cm above the carina. Stable NG tube position. IMPRESSION: Stable support apparatus. Persistent small right pleural effusion with right basilar atelectasis or infiltrate. Left basilar atelectasis. No pulmonary edema.  Electronically Signed   By: Natasha MeadLiviu  Pop M.D.   On: 07/17/2016 13:27     STUDIES:  CT head 8/2 > displaced fx of C1 along with fx of C6  CULTURES: Sputum 8/3 > few GNR, nl flora Gonorrhea 8/3 >neg Chlamydia 8/3 >neg 8/8 sputum>>  ANTIBIOTICS: Unasyn 8/3 >  SIGNIFICANT EVENTS: 8/2 > [facility] and C6 fx, intubated for resp insufficiency 8/3 > [facility]  LINES/TUBES: ETT 8/3 >  DISCUSSION: 82 y.o. M who suffered mechanical fall 8/1 and sustained C1 and C6 fx's.  He required intubation due to concern for airway compromise 8/3 and was later transferred to [facility] for further evaluation and management. Failed extubation 8/8 will need ENT Trach.   ASSESSMENT / PLAN:  NEUROLOGIC A:   Acute encephalopathy > improving Displaced C1 and C6 fx's - s/p mechanical fall Seen by neurosurgery, no role for surgery.   P:   Sedation: fentanyl PRN RASS goal: 0  WUA daily,MAE x4, follows commands Continue C collar and follow up in 6 weeks  PULMONARY A: Acute respiratory failure with hypoxemia  Aspiration pneumonia RLL Collapse/atelectasis RLL Atelectasis, collapse resolved rt / asp 8/8 failed extubation P:   Extubated 8/8 and promptly required intubation and fob. Copious  Secretions, unable maintain Ve. Will need ENT trach Full vent support Pulmonary toilet Follow culture data   CARDIOVASCULAR A:  Hx HTN, HLD, DVT on coumadin P:  Monitor hemodynamics Continue outpatient atorvastatin Hold outpatient lisinopril, niacin OK to restart anticoag per NSGY on 8/6(on heparin drip)  RENAL Lab Results  Component Value Date   CREATININE 0.86 07/18/2016   CREATININE 0.85 07/16/2016   CREATININE 0.75 07/15/2016    A:   No acute issues P:   Monitor BMET and UOP Replace electrolytes as needed    GASTROINTESTINAL A:   GI prophylaxis Nutrition P:   SUP: Pantoprazole Continue TF  HEMATOLOGIC A:   On chronic coumadin for chronic DVT VTE  Prophylaxis P:  NSGY Ok with restart anticoagulation, restarted 8/6 Follow CBC  INFECTIOUS A:   Aspiration pneumonia RLL  Penile discharge P:   Abx as above (unasyn) Follow cultures  ENDOCRINE CBG (last 3)   Recent Labs  07/17/16 1234 07/17/16 1547 07/17/16 1949  GLUCAP 122* 108* 96     A:   At risk hyperglycemia   P:   Continue ssi  Family updated: No family at bedside 8/9  Interdisciplinary Family Meeting v Palliative Care Meeting:  Due by: 07/19/16  Allen Hamilton ACNP Allen Hamilton PCCM Pager [phone] till 3 pm If no answer page [phone] 07/18/2016, 8:24 AM   STAFF NOTE: I, Allen Percyaniel Feinstein, Allen Hamilton FACP have personally reviewed patient's available data, including medical history, events of note, physical examination and test results as part of my evaluation. I have discussed with resident/NP and  other care providers such as pharmacist, RN and RRT. In addition, I personally evaluated patient and elicited key findings of: rass -3, folowed commands, moves al ext equally, pcxr no collapse, will follow bal from bronch, maintain current abx, pcxr with int prominence, will add scheduled lasix to neg balance 1 liter daily goals, wean sbt this am , for trach by ENT, appreciated , I updated the [pt and wife and son in room The patient is critically ill with multiple organ systems failure and requires high complexity decision making for assessment and support, frequent evaluation and titration of therapies, application of advanced monitoring technologies and extensive interpretation of multiple databases.   Critical Care Time devoted to patient care services described in this note is 30 Minutes. This time reflects time of care of this signee: Allen Percy, Allen Hamilton FACP. This critical care time does not reflect procedure time, or teaching time or supervisory time of PA/NP/Med student/Med Resident etc but could involve care discussion time. Rest per NP/medical resident whose note is outlined  above and that I agree with   Allen Hamilton. Allen Alias, Allen Hamilton, FACP Pgr: [phone] [facility] 07/18/2016 10:05 AM

## 2016-07-18 NOTE — Progress Notes (Signed)
Nutrition Follow Up  DOCUMENTATION CODES:   Obesity unspecified  INTERVENTION:    As medically appropriate, resume Vital High Protein at goal rate of 30 ml/hr with Prostat liquid protein 60 ml TID   TF regimen + current Propofol will provide 1777 kcals, 153 gm protein, 602 ml free water daily  NUTRITION DIAGNOSIS:   Inadequate oral intake related to inability to eat as evidenced by NPO status, ongoing  GOAL:   Provide needs based on ASPEN/SCCM guidelines, met  MONITOR:   Vent status, Labs, Weight trends, TF tolerance, Skin, I & O's  ASSESSMENT:   82 y.o. M who suffered mechanical fall 8/1 and sustained C1 and C6 fx's.  He required intubation due to concern for airway compromise 8/3 and was later transferred to [facility] for further evaluation and management.  Patient is currently intubated on ventilator support Temp (24hrs), Avg:100.2 F (37.9 C), Min:99.1 F (37.3 C), Max:100.9 F (38.3 C)  Propofol: 17.3 ml/hr >> 457 fat kcals   Pt was extubated 8/8 and promptly re-intubated. ENT consulted for tracheostomy. Vital High Protein formula currently off for procedure. Previously infusing at goal rate of 30 ml/hr via OGT. Pt also receiving Prostat liquid protein 60 ml TID.  Diet Order:  Diet NPO time specified Diet NPO time specified  Skin:  Reviewed, no issues  Last BM:  8/8  Height:   Ht Readings from Last 1 Encounters:  07/12/16 5' 10"  (1.778 m)    Weight >>> stable  Wt Readings from Last 1 Encounters:  07/18/16 245 lb 13 oz (111.5 kg)    Ideal Body Weight:  75.5 kg  BMI:  Body mass index is 35.27 kg/m.  Estimated Nutritional Needs:   Kcal:  8685-4883  Protein:  >150 gm  Fluid:  2 L  EDUCATION NEEDS:   No education needs identified at this time  Arthur Holms, RD, LDN Pager #: [phone] After-Hours Pager #: [phone]

## 2016-07-19 ENCOUNTER — Inpatient Hospital Stay (HOSPITAL_COMMUNITY): Payer: Medicare Other | Admitting: Certified Registered"

## 2016-07-19 ENCOUNTER — Inpatient Hospital Stay (HOSPITAL_COMMUNITY): Payer: Medicare Other

## 2016-07-19 ENCOUNTER — Encounter (HOSPITAL_COMMUNITY): Payer: Self-pay | Admitting: Certified Registered Nurse Anesthetist

## 2016-07-19 ENCOUNTER — Encounter (HOSPITAL_COMMUNITY)
Admission: AD | Disposition: A | Discharge status: Nursing Home (Includes ICF) | Payer: Self-pay | Source: Other Acute Inpatient Hospital | Attending: Internal Medicine

## 2016-07-19 DIAGNOSIS — T17998S Other foreign object in respiratory tract, part unspecified causing other injury, sequela: Secondary | ICD-10-CM

## 2016-07-19 HISTORY — PX: TRACHEOSTOMY TUBE PLACEMENT: SHX814

## 2016-07-19 LAB — BASIC METABOLIC PANEL
ANION GAP: 6 (ref 5–15)
BUN: 48 mg/dL — ABNORMAL HIGH (ref 6–20)
CALCIUM: 8.6 mg/dL — AB (ref 8.9–10.3)
CO2: 35 mmol/L — ABNORMAL HIGH (ref 22–32)
CREATININE: 1.09 mg/dL (ref 0.61–1.24)
Chloride: 99 mmol/L — ABNORMAL LOW (ref 101–111)
GFR calc non Af Amer: >60 mL/min (ref >60)
Glucose, Bld: 108 mg/dL — ABNORMAL HIGH (ref 65–99)
Potassium: 4.1 mmol/L (ref 3.5–5.1)
SODIUM: 140 mmol/L (ref 135–145)

## 2016-07-19 LAB — BLOOD GAS, ARTERIAL
Acid-Base Excess: 11.1 mmol/L — ABNORMAL HIGH (ref 0.0–2.0)
BICARBONATE: 35.1 meq/L — AB (ref 20.0–24.0)
Drawn by: 426241
FIO2: 0.4
LHR: 14 {breaths}/min
O2 Saturation: 91.1 %
PEEP/CPAP: 5 cmH2O
PH ART: 7.495 — AB (ref 7.350–7.450)
Patient temperature: 98.6
TCO2: 36.5 mmol/L (ref 0–100)
VT: 510 mL
pCO2 arterial: 45.9 mmHg — ABNORMAL HIGH (ref 35.0–45.0)
pO2, Arterial: 59.1 mmHg — ABNORMAL LOW (ref 80.0–100.0)

## 2016-07-19 LAB — GLUCOSE, CAPILLARY
GLUCOSE-CAPILLARY: 104 mg/dL — AB (ref 65–99)
GLUCOSE-CAPILLARY: 104 mg/dL — AB (ref 65–99)
GLUCOSE-CAPILLARY: 124 mg/dL — AB (ref 65–99)
Glucose-Capillary: 108 mg/dL — ABNORMAL HIGH (ref 65–99)
Glucose-Capillary: 114 mg/dL — ABNORMAL HIGH (ref 65–99)
Glucose-Capillary: 115 mg/dL — ABNORMAL HIGH (ref 65–99)
Glucose-Capillary: 118 mg/dL — ABNORMAL HIGH (ref 65–99)
Glucose-Capillary: 121 mg/dL — ABNORMAL HIGH (ref 65–99)

## 2016-07-19 LAB — CBC
HCT: 40.7 % (ref 39.0–52.0)
HEMOGLOBIN: 12.8 g/dL — AB (ref 13.0–17.0)
MCH: 30.2 pg (ref 26.0–34.0)
MCHC: 31.4 g/dL (ref 30.0–36.0)
MCV: 96 fL (ref 78.0–100.0)
Platelets: 317 10*3/uL (ref 150–400)
RBC: 4.24 MIL/uL (ref 4.22–5.81)
RDW: 14.3 % (ref 11.5–15.5)
WBC: 15.1 10*3/uL — AB (ref 4.0–10.5)

## 2016-07-19 LAB — HEPARIN LEVEL (UNFRACTIONATED): Heparin Unfractionated: 0.5 IU/mL (ref 0.30–0.70)

## 2016-07-19 LAB — CULTURE, RESPIRATORY: CULTURE: NORMAL

## 2016-07-19 LAB — TROPONIN I
Troponin I: <0.03 ng/mL (ref <0.03)
Troponin I: <0.03 ng/mL (ref <0.03)

## 2016-07-19 LAB — PROTIME-INR
INR: 1.36
PROTHROMBIN TIME: 16.9 s — AB (ref 11.4–15.2)

## 2016-07-19 SURGERY — CREATION, TRACHEOSTOMY
Anesthesia: General

## 2016-07-19 MED ORDER — MIDAZOLAM HCL 2 MG/2ML IJ SOLN
INTRAMUSCULAR | Status: AC
  Filled 2016-07-19: qty 2

## 2016-07-19 MED ORDER — AMIODARONE HCL IN DEXTROSE 360-4.14 MG/200ML-% IV SOLN
60.0000 mg/h | INTRAVENOUS | Status: AC
  Administered 2016-07-19 (×2): 60 mg/h via INTRAVENOUS
  Filled 2016-07-19 (×3): qty 200

## 2016-07-19 MED ORDER — LACTATED RINGERS IV SOLN
INTRAVENOUS | Status: DC | PRN
  Administered 2016-07-19: 07:00:00 via INTRAVENOUS

## 2016-07-19 MED ORDER — ROCURONIUM BROMIDE 10 MG/ML (PF) SYRINGE
PREFILLED_SYRINGE | INTRAVENOUS | Status: DC | PRN
  Administered 2016-07-19: 50 mg via INTRAVENOUS

## 2016-07-19 MED ORDER — CHLORHEXIDINE GLUCONATE 4 % EX LIQD
Freq: Once | CUTANEOUS | Status: AC
  Administered 2016-07-19: 1 via TOPICAL
  Filled 2016-07-19: qty 15

## 2016-07-19 MED ORDER — AMIODARONE LOAD VIA INFUSION
150.0000 mg | Freq: Once | INTRAVENOUS | Status: AC
  Administered 2016-07-19: 150 mg via INTRAVENOUS
  Filled 2016-07-19: qty 83.34

## 2016-07-19 MED ORDER — PROPOFOL 10 MG/ML IV BOLUS
INTRAVENOUS | Status: AC
  Filled 2016-07-19: qty 20

## 2016-07-19 MED ORDER — MIDAZOLAM HCL 5 MG/5ML IJ SOLN
INTRAMUSCULAR | Status: DC | PRN
  Administered 2016-07-19: 1 mg via INTRAVENOUS

## 2016-07-19 MED ORDER — 0.9 % SODIUM CHLORIDE (POUR BTL) OPTIME
TOPICAL | Status: DC | PRN
  Administered 2016-07-19: 1000 mL

## 2016-07-19 MED ORDER — FENTANYL CITRATE (PF) 250 MCG/5ML IJ SOLN
INTRAMUSCULAR | Status: AC
  Filled 2016-07-19: qty 5

## 2016-07-19 MED ORDER — AMIODARONE HCL IN DEXTROSE 360-4.14 MG/200ML-% IV SOLN
30.0000 mg/h | INTRAVENOUS | Status: DC
  Administered 2016-07-20 – 2016-07-24 (×10): 30 mg/h via INTRAVENOUS
  Filled 2016-07-19 (×10): qty 200

## 2016-07-19 MED ORDER — METOPROLOL TARTRATE 5 MG/5ML IV SOLN
2.5000 mg | INTRAVENOUS | Status: DC | PRN
  Administered 2016-07-22: 3 mg via INTRAVENOUS
  Administered 2016-07-26 (×2): 2.5 mg via INTRAVENOUS
  Filled 2016-07-19 (×3): qty 5

## 2016-07-19 MED ORDER — WARFARIN SODIUM 7.5 MG PO TABS
12.5000 mg | ORAL_TABLET | Freq: Once | ORAL | Status: AC
  Administered 2016-07-19: 12.5 mg via ORAL
  Filled 2016-07-19: qty 1

## 2016-07-19 MED ORDER — DILTIAZEM HCL 25 MG/5ML IV SOLN
20.0000 mg | Freq: Once | INTRAVENOUS | Status: AC
  Administered 2016-07-19: 20 mg via INTRAVENOUS
  Filled 2016-07-19: qty 5

## 2016-07-19 MED ORDER — HEPARIN (PORCINE) IN NACL 100-0.45 UNIT/ML-% IJ SOLN
1700.0000 [IU]/h | INTRAMUSCULAR | Status: DC
  Administered 2016-07-19 – 2016-07-20 (×2): 1750 [IU]/h via INTRAVENOUS
  Administered 2016-07-21 – 2016-07-22 (×3): 1950 [IU]/h via INTRAVENOUS
  Administered 2016-07-23 – 2016-07-24 (×2): 1750 [IU]/h via INTRAVENOUS
  Filled 2016-07-19 (×8): qty 250

## 2016-07-19 MED ORDER — LIDOCAINE-EPINEPHRINE 1 %-1:100000 IJ SOLN
INTRAMUSCULAR | Status: AC
  Filled 2016-07-19: qty 1

## 2016-07-19 MED ORDER — ACETAMINOPHEN 160 MG/5ML PO SOLN
650.0000 mg | Freq: Four times a day (QID) | ORAL | Status: DC | PRN
Start: 2016-07-19 — End: 2016-07-22
  Administered 2016-07-19 – 2016-07-21 (×3): 650 mg
  Filled 2016-07-19 (×3): qty 20.3

## 2016-07-19 MED ORDER — PHENYLEPHRINE 40 MCG/ML (10ML) SYRINGE FOR IV PUSH (FOR BLOOD PRESSURE SUPPORT)
PREFILLED_SYRINGE | INTRAVENOUS | Status: DC | PRN
  Administered 2016-07-19 (×2): 120 ug via INTRAVENOUS
  Administered 2016-07-19: 80 ug via INTRAVENOUS

## 2016-07-19 SURGICAL SUPPLY — 37 items
BENZOIN TINCTURE PRP APPL 2/3 (GAUZE/BANDAGES/DRESSINGS) IMPLANT
BLADE SURG 15 STRL LF DISP TIS (BLADE) ×1 IMPLANT
BLADE SURG 15 STRL SS (BLADE) ×2
CANISTER SUCTION 2500CC (MISCELLANEOUS) ×3 IMPLANT
CLEANER TIP ELECTROSURG 2X2 (MISCELLANEOUS) ×3 IMPLANT
COVER SURGICAL LIGHT HANDLE (MISCELLANEOUS) ×3 IMPLANT
DECANTER SPIKE VIAL GLASS SM (MISCELLANEOUS) ×3 IMPLANT
DRAPE PROXIMA HALF (DRAPES) IMPLANT
ELECT COATED BLADE 2.86 ST (ELECTRODE) ×3 IMPLANT
ELECT REM PT RETURN 9FT ADLT (ELECTROSURGICAL) ×3
ELECTRODE REM PT RTRN 9FT ADLT (ELECTROSURGICAL) ×1 IMPLANT
GAUZE SPONGE 4X4 16PLY XRAY LF (GAUZE/BANDAGES/DRESSINGS) ×3 IMPLANT
GLOVE BIOGEL PI IND STRL 6.5 (GLOVE) ×2 IMPLANT
GLOVE BIOGEL PI INDICATOR 6.5 (GLOVE) ×4
GLOVE ECLIPSE 6.5 STRL STRAW (GLOVE) ×3 IMPLANT
GLOVE ECLIPSE 7.5 STRL STRAW (GLOVE) ×3 IMPLANT
GOWN STRL REUS W/ TWL LRG LVL3 (GOWN DISPOSABLE) ×2 IMPLANT
GOWN STRL REUS W/TWL LRG LVL3 (GOWN DISPOSABLE) ×4
KIT BASIN OR (CUSTOM PROCEDURE TRAY) ×3 IMPLANT
KIT ROOM TURNOVER OR (KITS) ×3 IMPLANT
NEEDLE 27GAX1X1/2 (NEEDLE) ×3 IMPLANT
NS IRRIG 1000ML POUR BTL (IV SOLUTION) ×3 IMPLANT
PACK EENT II TURBAN DRAPE (CUSTOM PROCEDURE TRAY) ×3 IMPLANT
PAD ARMBOARD 7.5X6 YLW CONV (MISCELLANEOUS) ×6 IMPLANT
PENCIL FOOT CONTROL (ELECTRODE) ×3 IMPLANT
SPONGE DRAIN TRACH 4X4 STRL 2S (GAUZE/BANDAGES/DRESSINGS) ×3 IMPLANT
SUT CHROMIC 2 0 SH (SUTURE) ×3 IMPLANT
SUT ETHILON 3 0 PS 1 (SUTURE) ×3 IMPLANT
SUT SILK 4 0 TIE 10X30 (SUTURE) ×3 IMPLANT
SUT SILK 4 0 TIES 17X18 (SUTURE) ×3 IMPLANT
SYR 20CC LL (SYRINGE) ×3 IMPLANT
TOWEL OR 17X24 6PK STRL BLUE (TOWEL DISPOSABLE) ×3 IMPLANT
TUBE CONNECTING 12'X1/4 (SUCTIONS) ×1
TUBE CONNECTING 12X1/4 (SUCTIONS) ×2 IMPLANT
TUBE CONNECTING 20'X1/4 (TUBING) ×1
TUBE CONNECTING 20X1/4 (TUBING) ×2 IMPLANT
WATER STERILE IRR 1000ML POUR (IV SOLUTION) ×3 IMPLANT

## 2016-07-19 NOTE — H&P (View-Only) (Signed)
Reason for Consult: Tracheostomy Referring Physician: Raylene Miyamoto, MD  Allen Hamilton is an 82 y.o. male.  HPI: Trauma 1 week ago, cervical spine fractures, no neurologic injury, intubated for 6 days, attempted extubation yesterday with failure. Request made for tracheostomy consultation.  No past medical history on file.  No past surgical history on file.  No family history on file.  Social History:  has no tobacco, alcohol, and drug history on file.  Allergies:  Allergies  Allergen Reactions  . Hydromorphone     Respiratory depression    Medications: Reviewed  Results for orders placed or performed during the hospital encounter of 07/12/16 (from the past 48 hour(s))  Glucose, capillary     Status: Abnormal   Collection Time: 07/16/16 12:13 PM  Result Value Ref Range   Glucose-Capillary 110 (H) 65 - 99 mg/dL   Comment 1 Capillary Specimen    Comment 2 Notify RN   Glucose, capillary     Status: Abnormal   Collection Time: 07/16/16  4:04 PM  Result Value Ref Range   Glucose-Capillary 104 (H) 65 - 99 mg/dL   Comment 1 Capillary Specimen    Comment 2 Notify RN   Glucose, capillary     Status: None   Collection Time: 07/16/16  7:10 PM  Result Value Ref Range   Glucose-Capillary 99 65 - 99 mg/dL   Comment 1 Capillary Specimen    Comment 2 Notify RN    Comment 3 Document in Chart   Glucose, capillary     Status: Abnormal   Collection Time: 07/16/16 11:34 PM  Result Value Ref Range   Glucose-Capillary 106 (H) 65 - 99 mg/dL   Comment 1 Capillary Specimen    Comment 2 Notify RN    Comment 3 Document in Chart   Protime-INR     Status: Abnormal   Collection Time: 07/17/16  6:33 AM  Result Value Ref Range   Prothrombin Time 16.2 (H) 11.4 - 15.2 seconds   INR 1.29   CBC     Status: Abnormal   Collection Time: 07/17/16  6:33 AM  Result Value Ref Range   WBC 15.5 (H) 4.0 - 10.5 K/uL   RBC 4.25 4.22 - 5.81 MIL/uL   Hemoglobin 12.6 (L) 13.0 - 17.0 g/dL   HCT 40.8  39.0 - 52.0 %   MCV 96.0 78.0 - 100.0 fL   MCH 29.6 26.0 - 34.0 pg   MCHC 30.9 30.0 - 36.0 g/dL   RDW 14.2 11.5 - 15.5 %   Platelets 304 150 - 400 K/uL  Heparin level (unfractionated)     Status: None   Collection Time: 07/17/16  6:33 AM  Result Value Ref Range   Heparin Unfractionated 0.55 0.30 - 0.70 IU/mL    Comment:        IF HEPARIN RESULTS ARE BELOW EXPECTED VALUES, AND PATIENT DOSAGE HAS BEEN CONFIRMED, SUGGEST FOLLOW UP TESTING OF ANTITHROMBIN III LEVELS.   Glucose, capillary     Status: Abnormal   Collection Time: 07/17/16  8:33 AM  Result Value Ref Range   Glucose-Capillary 115 (H) 65 - 99 mg/dL  I-STAT 3, arterial blood gas (G3+)     Status: Abnormal   Collection Time: 07/17/16 10:34 AM  Result Value Ref Range   pH, Arterial 7.384 7.350 - 7.450   pCO2 arterial 59.7 (HH) 35.0 - 45.0 mmHg   pO2, Arterial 77.0 (L) 80.0 - 100.0 mmHg   Bicarbonate 35.6 (H) 20.0 - 24.0 mEq/L  TCO2 37 0 - 100 mmol/L   O2 Saturation 94.0 %   Acid-Base Excess 9.0 (H) 0.0 - 2.0 mmol/L   Patient temperature 98.6 F    Collection site RADIAL, ALLEN'S TEST ACCEPTABLE    Drawn by Operator    Sample type ARTERIAL    Comment NOTIFIED PHYSICIAN   Culture, respiratory (NON-Expectorated)     Status: None (Preliminary result)   Collection Time: 07/17/16 11:50 AM  Result Value Ref Range   Specimen Description TRACHEAL ASPIRATE    Special Requests NONE    Gram Stain      ABUNDANT WBC PRESENT, PREDOMINANTLY PMN RARE SQUAMOUS EPITHELIAL CELLS PRESENT FEW GRAM POSITIVE COCCI    Culture PENDING    Report Status PENDING   Glucose, capillary     Status: Abnormal   Collection Time: 07/17/16 12:34 PM  Result Value Ref Range   Glucose-Capillary 122 (H) 65 - 99 mg/dL  Glucose, capillary     Status: Abnormal   Collection Time: 07/17/16  3:47 PM  Result Value Ref Range   Glucose-Capillary 108 (H) 65 - 99 mg/dL   Comment 1 Capillary Specimen   Glucose, capillary     Status: None   Collection Time:  07/17/16  7:49 PM  Result Value Ref Range   Glucose-Capillary 96 65 - 99 mg/dL   Comment 1 Capillary Specimen    Comment 2 Notify RN    Comment 3 Document in Chart   Protime-INR     Status: Abnormal   Collection Time: 07/18/16  2:50 AM  Result Value Ref Range   Prothrombin Time 16.5 (H) 11.4 - 15.2 seconds   INR 1.32   Heparin level (unfractionated)     Status: None   Collection Time: 07/18/16  2:50 AM  Result Value Ref Range   Heparin Unfractionated 0.63 0.30 - 0.70 IU/mL    Comment:        IF HEPARIN RESULTS ARE BELOW EXPECTED VALUES, AND PATIENT DOSAGE HAS BEEN CONFIRMED, SUGGEST FOLLOW UP TESTING OF ANTITHROMBIN III LEVELS.   CBC     Status: Abnormal   Collection Time: 07/18/16  2:50 AM  Result Value Ref Range   WBC 17.8 (H) 4.0 - 10.5 K/uL   RBC 4.29 4.22 - 5.81 MIL/uL   Hemoglobin 13.2 13.0 - 17.0 g/dL   HCT 41.2 39.0 - 52.0 %   MCV 96.0 78.0 - 100.0 fL   MCH 30.8 26.0 - 34.0 pg   MCHC 32.0 30.0 - 36.0 g/dL   RDW 14.3 11.5 - 15.5 %   Platelets 297 150 - 400 K/uL  Basic metabolic panel     Status: Abnormal   Collection Time: 07/18/16  2:50 AM  Result Value Ref Range   Sodium 141 135 - 145 mmol/L   Potassium 4.0 3.5 - 5.1 mmol/L   Chloride 100 (L) 101 - 111 mmol/L   CO2 32 22 - 32 mmol/L   Glucose, Bld 103 (H) 65 - 99 mg/dL   BUN 34 (H) 6 - 20 mg/dL   Creatinine, Ser 0.86 0.61 - 1.24 mg/dL   Calcium 8.8 (L) 8.9 - 10.3 mg/dL   GFR calc non Af Amer >60 >60 mL/min   GFR calc Af Amer >60 >60 mL/min    Comment: (NOTE) The eGFR has been calculated using the CKD EPI equation. This calculation has not been validated in all clinical situations. eGFR's persistently <60 mL/min signify possible Chronic Kidney Disease.    Anion gap 9 5 - 15  Blood gas, arterial     Status: Abnormal   Collection Time: 07/18/16  3:40 AM  Result Value Ref Range   FIO2 0.40    Delivery systems VENTILATOR    Mode PRESSURE REGULATED VOLUME CONTROL    VT 510 mL   LHR 14 resp/min    Peep/cpap 5.0 cm H20   pH, Arterial 7.455 (H) 7.350 - 7.450   pCO2 arterial 51.2 (H) 35.0 - 45.0 mmHg   pO2, Arterial 74.1 (L) 80.0 - 100.0 mmHg   Bicarbonate 35.0 (H) 20.0 - 24.0 mEq/L   TCO2 36.5 0 - 100 mmol/L   Acid-Base Excess 10.8 (H) 0.0 - 2.0 mmol/L   O2 Saturation 94.3 %   Patient temperature 100.5    Collection site RIGHT RADIAL    Drawn by [phone]    Sample type ARTERIAL DRAW    Allens test (pass/fail) PASS PASS    Dg Chest Port 1 View  Result Date: 07/18/2016 CLINICAL DATA:  Intubation. EXAM: PORTABLE CHEST 1 VIEW COMPARISON:  07/17/2016. FINDINGS: Endotracheal tube and NG tube in stable position. Cardiomegaly mild bilateral from interstitial prominence and pleural effusions suggesting congestive heart failure. Low lung volumes with bibasilar atelectasis . No pneumothorax. IMPRESSION: 1. Lines and tubes in stable position. 2. Cardiomegaly with mild increased interstitial markings consistent with interstitial edema. Small bilateral effusions. 3. Low lung volumes with basilar atelectasis . Electronically Signed   By: Marcello Moores  Register   On: 07/18/2016 07:12   Dg Chest Port 1 View  Result Date: 07/17/2016 CLINICAL DATA:  Pneumonia EXAM: PORTABLE CHEST 1 VIEW COMPARISON:  07/17/2016 FINDINGS: Cardiomediastinal silhouette is stable. Persistent small right pleural effusion with right basilar atelectasis or infiltrate. Left basilar atelectasis stable. No pulmonary edema. Endotracheal tube is unchanged in position with tip 2.5 cm above the carina. Stable NG tube position. IMPRESSION: Stable support apparatus. Persistent small right pleural effusion with right basilar atelectasis or infiltrate. Left basilar atelectasis. No pulmonary edema. Electronically Signed   By: Lahoma Crocker M.D.   On: 07/17/2016 13:27   Dg Chest Port 1 View  Result Date: 07/17/2016 CLINICAL DATA:  Acute respiratory failure. EXAM: PORTABLE CHEST 1 VIEW COMPARISON:  Radiograph of July 16, 2016. FINDINGS: Stable cardiomegaly.  Endotracheal and nasogastric tubes are unchanged in position. No pneumothorax is noted. Stable right basilar opacity is noted concerning for atelectasis with minimal associated pleural effusion. Left basilar subsegmental atelectasis is noted as well. Bony thorax is unremarkable. IMPRESSION: Stable support apparatus. Stable bibasilar opacities as described above. Electronically Signed   By: Marijo Conception, M.D.   On: 07/17/2016 07:53    OBS:[id number] except as listed in admit H&P  Blood pressure 132/71, pulse 75, temperature (!) 100.6 F (38.1 C), temperature source Axillary, resp. rate 14, height 5' 10"  (1.778 m), weight 111.5 kg (245 lb 13 oz), SpO2 96 %.  PHYSICAL EXAM: Overall appearance:  Sedated on the ventilator Head:  Normocephalic, atraumatic. Oral Cavity/Pharynx:  Oral endotracheal tube in place. Larynx/Hypopharynx: Deferred Neuro:  Sedated on the ventilator. Neck: No palpable neck masses. Cervical collar in place. Low riding thyroid notch. No masses palpable.  Studies Reviewed: none  Procedures: none   Assessment/Plan: Chart reviewed, examination complete. Discussed with the patient's wife and daughter. With the recommendation for tracheostomy. We will try to do this later today or at the latest tomorrow. Risks and benefits were discussed in detail. All questions were answered.  Dawan Farney 07/18/2016, 10:00 AM

## 2016-07-19 NOTE — Interval H&P Note (Signed)
History and Physical Interval Note:  07/19/2016 7:20 AM  Nunzio CobbsPaul G Caperton  has presented today for surgery, with the diagnosis of Allen Hamilton  The various methods of treatment have been discussed with the patient and family. After consideration of risks, benefits and other options for treatment, the patient has consented to  Procedure(s): TRACHEOSTOMY (N/A) as a surgical intervention .  The patient's history has been reviewed, patient examined, no change in status, stable for surgery.  I have reviewed the patient's chart and labs.  Questions were answered to the patient's satisfaction.     [name]

## 2016-07-19 NOTE — Progress Notes (Signed)
Pt seen by trach team for follow up. No education given at this time. Equipment ordered for bedside needs. Will continue to follow.

## 2016-07-19 NOTE — Anesthesia Postprocedure Evaluation (Signed)
Anesthesia Post Note  Patient: Allen Hamilton  Procedure(s) Performed: Procedure(s) (LRB): TRACHEOSTOMY (N/A)  Patient location during evaluation: SICU Anesthesia Type: General Level of consciousness: sedated Pain management: pain level controlled Vital Signs Assessment: post-procedure vital signs reviewed and stable Respiratory status: patient on ventilator - see flowsheet for VS Cardiovascular status: stable Anesthetic complications: no    Last Vitals:  Vitals:   07/19/16 0829 07/19/16 0838  BP: (!) 115/59   Pulse: (!) 123   Resp: 14   Temp:  37.6 C    Last Pain:  Vitals:   07/19/16 0838  TempSrc: Axillary                 Falyn Rubel,W. EDMOND

## 2016-07-19 NOTE — Transfer of Care (Signed)
Immediate Anesthesia Transfer of Care Note  Patient: Nunzio Cobbsaul G Chamblee  Procedure(s) Performed: Procedure(s): TRACHEOSTOMY (N/A)  Patient Location: SICU  Anesthesia Type:General  Level of Consciousness: sedated and Patient remains intubated per anesthesia plan  Airway & Oxygen Therapy: Patient remains intubated per anesthesia plan and Patient placed on Ventilator (see vital sign flow sheet for setting).  Trach connected to ventilator by RT  Post-op Assessment: Report given to RN and Post -op Vital signs reviewed and stable  Post vital signs: Reviewed and stable  Last Vitals:  Vitals:   07/19/16 0700 07/19/16 0704  BP: 122/65   Pulse: 67 61  Resp: 14 14  Temp:      Last Pain:  Vitals:   07/19/16 0328  TempSrc: Axillary         Complications: No apparent anesthesia complications

## 2016-07-19 NOTE — Progress Notes (Signed)
eLink Physician-Brief Progress Note Patient Name: Allen Hamilton DOB: 1933-06-16 MRN: [id number]   Date of Service  07/19/2016  HPI/Events of Note  AFIB with RVR - Ventricular rate 125-150. BP = 94/72. Patient has been diuresed, given Cardizem IV bolus (20 mg) and is on a Propofol IV infusion. In addition, has lost atrial kick with AFIB.  eICU Interventions  Will order:  1. Amiodarone IV load and infusion.      Intervention Category Major Interventions: Arrhythmia - evaluation and management  Sommer,Steven Eugene 07/19/2016, 3:25 PM

## 2016-07-19 NOTE — Progress Notes (Signed)
eLink Physician-Brief Progress Note Patient Name: Allen Hamilton G Vanbrocklin DOB: 07-16-1933 MRN: [id number]   Date of Service  07/19/2016  HPI/Events of Note  Foley bag leaking.   eICU Interventions  Will order new Foley catheter placed.      Intervention Category Minor Interventions: Routine modifications to care plan (e.g. PRN medications for pain, fever)  Sommer,Steven Eugene 07/19/2016, 4:31 PM

## 2016-07-19 NOTE — Progress Notes (Signed)
PULMONARY / CRITICAL CARE MEDICINE   Name: Allen Hamilton MRN: [id number] DOB: 22-Mar-1933    ADMISSION DATE:  07/12/2016 CONSULTATION DATE:  07/12/16  REFERRING MD:  [facility]  CHIEF COMPLAINT:  C1 fracture s/p fall  BRIEF: 82 y/o male with HTN, HLD and DVT on coumadin here with a C1 fracture post fall, intubated but neurologically intact, has RLL aspiration pneumonia and mucus plugging.   SUBJECTIVE:  Returned from OR p-tstomy Developed new onset AF-RVR afebrile   VITAL SIGNS: BP 122/65   Pulse 61   Temp 99.7 F (37.6 C) (Axillary)   Resp 14   Ht 5\' 10"  (1.778 m)   Wt 244 lb 7.8 oz (110.9 kg)   SpO2 91%   BMI 35.08 kg/m   HEMODYNAMICS:    VENTILATOR SETTINGS: Vent Mode: PRVC FiO2 (%):  [40 %] 40 % Set Rate:  [14 bmp] 14 bmp Vt Set:  [510 mL] 510 mL PEEP:  [5 cmH20] 5 cmH20 Plateau Pressure:  [19 cmH20-22 cmH20] 22 cmH20  INTAKE / OUTPUT: I/O last 3 completed shifts: In: 2663.4 [I.V.:1276.4; NG/GT:787; IV Piggyback:600] Out: 2875 [Urine:2725; Emesis/NG output:150]   PHYSICAL EXAMINATION: General: sedated  on vent HENT: NCAT hard collar , new bovona adjustable PULM:decreased bs bases  CV: RRR, no mgr GI: BS+, soft, nontender Neuro: RASS-3 on diprivan Derm: no rash or skin breakdown  LABS:  BMET  Recent Labs Lab 07/16/16 0518 07/18/16 0250 07/19/16 0223  NA 141 141 140  K 3.7 4.0 4.1  CL 98* 100* 99*  CO2 35* 32 35*  BUN 30* 34* 48*  CREATININE 0.85 0.86 1.09  GLUCOSE 112* 103* 108*    Electrolytes  Recent Labs Lab 07/13/16 1623 07/14/16 0539 07/14/16 1640  07/16/16 0518 07/18/16 0250 07/19/16 0223  CALCIUM  --  8.3*  --   < > 8.7* 8.8* 8.6*  MG 1.8 1.8 2.0  --   --   --   --   PHOS 3.1 3.2 3.8  --   --   --   --   < > = values in this interval not displayed.  CBC  Recent Labs Lab 07/17/16 0633 07/18/16 0250 07/19/16 0223  WBC 15.5* 17.8* 15.1*  HGB 12.6* 13.2 12.8*  HCT 40.8 41.2 40.7  PLT 304 297 317     Coag's  Recent Labs Lab 07/17/16 0633 07/18/16 0250 07/19/16 0223  INR 1.29 1.32 1.36    Sepsis Markers  Recent Labs Lab 07/12/16 1841 07/12/16 1854  LATICACIDVEN  --  1.2  PROCALCITON 0.15  --     ABG  Recent Labs Lab 07/17/16 1034 07/18/16 0340 07/19/16 0510  PHART 7.384 7.455* 7.495*  PCO2ART 59.7* 51.2* 45.9*  PO2ART 77.0* 74.1* 59.1*    Liver Enzymes  Recent Labs Lab 07/12/16 1841  AST 15  ALT 14*  ALKPHOS 46  BILITOT 0.6  ALBUMIN 2.6*    Cardiac Enzymes  Recent Labs Lab 07/12/16 1841 07/12/16 2305 07/13/16 0725  TROPONINI 0.06* <0.03 <0.03    Glucose  Recent Labs Lab 07/18/16 1212 07/18/16 1635 07/18/16 2013 07/18/16 2321 07/19/16 0326 07/19/16 0833  GLUCAP 96 115* 100* 99 104* 108*    Imaging Dg Chest Port 1 View  Result Date: 07/19/2016 CLINICAL DATA:  Respiratory failure. EXAM: PORTABLE CHEST 1 VIEW COMPARISON:  07/18/2016. FINDINGS: Endotracheal tube and NG tube in stable position. Cardiomegaly with bilateral interstitial prominence and pleural effusions consistent congestive heart failure. No interim change from prior exam. Low lung volumes  with bibasilar atelectasis and/or infiltrates. No pneumothorax . IMPRESSION: 1. Lines and tubes in stable position. 2. Cardiomegaly with bilateral interstitial prominence and bilateral pleural effusions suggesting congestive heart failure. No interim change. 3. Low lung volumes with bibasilar atelectasis and/or infiltrates. Electronically Signed   By: Maisie Fus  Register   On: 07/19/2016 07:30     STUDIES:  CT head 8/2 > displaced fx of C1 along with fx of C6  CULTURES: Sputum 8/3 > few GNR, nl flora Gonorrhea 8/3 >neg Chlamydia 8/3 >neg 8/8 sputum>> nml flora  ANTIBIOTICS: Unasyn 8/3 >  SIGNIFICANT EVENTS: 8/2 > admitted at [facility] with C1 and C6 fx, intubated for resp insufficiency 8/3 > transferred to [facility] ICU  LINES/TUBES: ETT 8/3 > 8/8 Re-ETT 8/8 >> tstomy 8/10  >>  DISCUSSION: 82 y.o. M who suffered mechanical fall 8/1 and sustained C1 and C6 fx's.  He required intubation due to concern for airway compromise 8/3 and was later transferred to [facility] for further evaluation and management. Failed extubation 8/8 & ENT Trach.   ASSESSMENT / PLAN:  NEUROLOGIC A:   Acute encephalopathy > improving Displaced C1 and C6 fx's - s/p mechanical fall Seen by neurosurgery, no role for surgery.   P:   Sedation: propofol gtt + fentanyl PRN RASS goal: 0   Continue C collar x 6 weeks  PULMONARY A: Acute respiratory failure with hypoxemia  Aspiration pneumonia RLL Collapse/atelectasis RLL Atelectasis, collapse resolved rt / asp 8/8 failed extubation P:   Full vent support & sedation today , then transition to ATC in 24h  Pulmonary toilet Follow culture data   CARDIOVASCULAR A:  Hx HTN, HLD, DVT on coumadin 8/10 New onset AF-RVR P:  Monitor hemodynamics Continue outpatient atorvastatin Hold outpatient lisinopril, niacin Resume  heparin drip Cardizem for rate control  RENAL Lab Results  Component Value Date   CREATININE 1.09 07/19/2016   CREATININE 0.86 07/18/2016   CREATININE 0.85 07/16/2016    A:   AKI - slight rise cr from 0.86 to 1.09 P:   Monitor BMET and UOP Replace electrolytes as needed    GASTROINTESTINAL A:   GI prophylaxis Nutrition P:   SUP: Pantoprazole Resume  TF  HEMATOLOGIC A:   On chronic coumadin for chronic DVT VTE Prophylaxis P:  NSGY Ok with restart anticoagulation, restarted 8/6 Follow CBC  INFECTIOUS A:   Aspiration pneumonia RLL  Penile discharge P:   Ct unasyn Since cultures neg, plan 7ds  ENDOCRINE CBG (last 3)   Recent Labs  07/18/16 2321 07/19/16 0326 07/19/16 0833  GLUCAP 99 104* 108*     A:   At risk hyperglycemia   P:   Continue ssi  Family updated:  8/9, family hopeful to transfer him to [place] for rehab eventually  Interdisciplinary Family Meeting v Palliative Care  Meeting:  NA  Cc time x 53m  Cyril Mourning MD. [facility]. [place] [facility] Pager [phone] If no response call [phone]     07/19/2016, 8:44 AM

## 2016-07-19 NOTE — Progress Notes (Signed)
ANTICOAGULATION CONSULT NOTE - f/u Consult  Pharmacy Consult for Warfarin/Heparin. Indication: Chronic warfarin PTA for prior DVT.  Allergies  Allergen Reactions  . Hydromorphone     Respiratory depression    Patient Measurements: Height: 5\' 10"  (177.8 cm) Weight: 244 lb 7.8 oz (110.9 kg) IBW/kg (Calculated) : 73 Heparin Dosing Weight: 98.6  Vital Signs: Temp: 99.5 F (37.5 C) (08/10 1934) Temp Source: Axillary (08/10 1934) BP: 110/47 (08/10 1939) Pulse Rate: 88 (08/10 1939)  Labs:  Recent Labs  07/17/16 [id number] 07/18/16 0250 07/19/16 0223 07/19/16 0929 07/19/16 1457 07/19/16 1856  HGB 12.6* 13.2 12.8*  --   --   --   HCT 40.8 41.2 40.7  --   --   --   PLT 304 297 317  --   --   --   LABPROT 16.2* 16.5* 16.9*  --   --   --   INR 1.29 1.32 1.36  --   --   --   HEPARINUNFRC 0.55 0.63  --   --   --  0.50  CREATININE  --  0.86 1.09  --   --   --   TROPONINI  --   --   --  <0.03 <0.03  --     Estimated Creatinine Clearance: 67.4 mL/min (by C-G formula based on SCr of 1.09 mg/dL).   Assessment:  82 yo male with Hx DVTs on chronic coumadin PTA. Coumadin was held warf for cervical fx. No procedures plans and pharmacy consulted to restart coumadin and heparin. Heparin was stopped this am for trach procedure this am and restarted at 10:30 this morning.     HL therapeutic after restart. No bleeding reported.   **PTA warfarin dose: 8 mg Mon/Thurs, 9 mg ROW  Goal of Therapy:  INR 2-3 Monitor platelets by anticoagulation protocol: Yes    Plan:  - Continue heparin at 1750 units/hr - Daily INR, CBC  Pollyann SamplesAndy Mckinley Adelstein, PharmD, BCPS 07/19/2016, 8:40 PM Pager: [phone]

## 2016-07-19 NOTE — Progress Notes (Signed)
eLink Physician-Brief Progress Note Patient Name: Allen Hamilton DOB: 1933/11/02 MRN: [id number]   Date of Service  07/19/2016  HPI/Events of Note  Fever to 102.2 F. Already on Unasyn IV.  eICU Interventions  Will order: 1. Blood Cultures X 2.  2. Tylenol liquid 650 mg via tube Q 6 hours PRN Temp > 101.5 F.     Intervention Category Intermediate Interventions: Infection - evaluation and management  Sommer,Steven Eugene 07/19/2016, 4:57 PM

## 2016-07-19 NOTE — Op Note (Signed)
07/19/2016 8:06 AM   PATIENT:  Allen Hamilton, 82 y.o. male  PRE-OPERATIVE DIAGNOSIS:  Vent depend   POST-OPERATIVE DIAGNOSIS:  Vent depend  PROCEDURE:  Procedure(s): TRACHEOSTOMY  SURGEON:  Surgeon(s): Susy FrizzleJefry H Ayansh Feutz, MD  ASSISTANTS: none   ANESTHESIA:   general  EBL: Minimal   DRAINS: none   LOCAL MEDICATIONS USED:  NONE  COUNTS CORRECT:  YES  PROCEDURE DETAILS: Patient was taken to the operating room and placed on the operating table in the supine position. A shoulder roll was placed for positioning. The patient was previously orally intubated. The cervical collar was carefully removed and the neck was stabilized throughout the procedure. The neck was prepped and draped in a standard fashion. A vertical incision was created just above the sternal notch using electrocautery. The midline fascia was divided. The isthmus of the thyroid was reflected superiorly and the upper trachea was exposed. A tracheotomy was created between the first and second  tracheal rings in a horizontal fashion. A lower tracheal flap was created with scissors and the flap was sutured to the cervical skin using 2-0 chromic suture. The orotracheal tube was removed. The 8 mm I.D. Adjustable  tracheostomy tube was placed without difficulty and the cuff was inflated. The shield was secured to the neck using a Velcro straps and nylon suture. The patient was then transferred back to the intensive care unit in critical condition.  PLAN OF CARE: Transfer to ICU  PATIENT DISPOSITION:  ICU - hemodynamically stable.

## 2016-07-19 NOTE — Progress Notes (Signed)
Pt HR 120s-140s with BP of 94/72. Notified ELINK was instructed to give 150 amio bolus then proceed with  for 6 hours and then  continuously. Will continue to monitor closely.

## 2016-07-19 NOTE — Anesthesia Procedure Notes (Signed)
Date/Time: 07/19/2016 7:40 AM Performed by: Bobbie StackANDERSON, Woodson Macha KIRSTEN Pre-anesthesia Checklist: Patient identified, Emergency Drugs available, Suction available and Patient being monitored Patient Re-evaluated:Patient Re-evaluated prior to inductionOxygen Delivery Method: Circle system utilized Preoxygenation: Pre-oxygenation with 100% oxygen Intubation Type: Inhalational induction with existing ETT

## 2016-07-19 NOTE — Progress Notes (Signed)
ANTICOAGULATION CONSULT NOTE - f/u Consult  Pharmacy Consult for Warfarin/Heparin. Indication: Chronic warfarin PTA for prior DVT.  Allergies  Allergen Reactions  . Hydromorphone     Respiratory depression    Patient Measurements: Height: 5\' 10"  (177.8 cm) Weight: 244 lb 7.8 oz (110.9 kg) IBW/kg (Calculated) : 73 Heparin Dosing Weight: 98.6  Vital Signs: Temp: 99.7 F (37.6 C) (08/10 0838) Temp Source: Axillary (08/10 0838) BP: 122/65 (08/10 0700) Pulse Rate: 61 (08/10 0704)  Labs:  Recent Labs  07/17/16 0633 07/18/16 0250 07/19/16 0223  HGB 12.6* 13.2 12.8*  HCT 40.8 41.2 40.7  PLT 304 297 317  LABPROT 16.2* 16.5* 16.9*  INR 1.29 1.32 1.36  HEPARINUNFRC 0.55 0.63  --   CREATININE  --  0.86 1.09    Estimated Creatinine Clearance: 67.4 mL/min (by C-G formula based on SCr of 1.09 mg/dL).   Assessment:  82 yo male with Hx DVTs on chronic coumadin PTA. Coumadin was held warf for cervical fx. No procedures plans and pharmacy consulted to restart coumadin and heparin. Heparin was stopped this am for trach procedure. Per MD ok to resume heparin.  -last heparin rate was 1750 units/hr (heparin level= 0.63) -INR= 1.32  **PTA warfarin dose: 8 mg Mon/Thurs, 9 mg ROW  Goal of Therapy:  INR 2-3 Monitor platelets by anticoagulation protocol: Yes    Plan:  -Restart heparin at 1750 units/hr -heparin level at 7pm -coumadin 12.5mg  today -Daily INR, CBC  Harland GermanAndrew Rosamond Andress, Pharm D 07/19/2016 10:20 AM    Harland GermanAndrew Danaka Llera, Pharm D 07/19/2016 10:20 AM

## 2016-07-19 NOTE — Progress Notes (Signed)
When dumoing urine from chamber into bag, urine leaked onto floor from hole in bag. Notified [facility] requesting to place a new foley. No orders at this time. Will continue to monitor.

## 2016-07-20 ENCOUNTER — Encounter (HOSPITAL_COMMUNITY): Payer: Self-pay | Admitting: Otolaryngology

## 2016-07-20 DIAGNOSIS — G8250 Quadriplegia, unspecified: Secondary | ICD-10-CM

## 2016-07-20 LAB — BASIC METABOLIC PANEL
ANION GAP: 9 (ref 5–15)
BUN: 55 mg/dL — ABNORMAL HIGH (ref 6–20)
CALCIUM: 8.4 mg/dL — AB (ref 8.9–10.3)
CO2: 37 mmol/L — ABNORMAL HIGH (ref 22–32)
Chloride: 98 mmol/L — ABNORMAL LOW (ref 101–111)
Creatinine, Ser: 1 mg/dL (ref 0.61–1.24)
Glucose, Bld: 122 mg/dL — ABNORMAL HIGH (ref 65–99)
POTASSIUM: 3.1 mmol/L — AB (ref 3.5–5.1)
SODIUM: 144 mmol/L (ref 135–145)

## 2016-07-20 LAB — C DIFFICILE QUICK SCREEN W PCR REFLEX
C DIFFICILE (CDIFF) TOXIN: NEGATIVE
C DIFFICLE (CDIFF) ANTIGEN: NEGATIVE
C Diff interpretation: NOT DETECTED

## 2016-07-20 LAB — GLUCOSE, CAPILLARY
GLUCOSE-CAPILLARY: 119 mg/dL — AB (ref 65–99)
GLUCOSE-CAPILLARY: 115 mg/dL — AB (ref 65–99)
GLUCOSE-CAPILLARY: 115 mg/dL — AB (ref 65–99)
GLUCOSE-CAPILLARY: 112 mg/dL — AB (ref 65–99)
Glucose-Capillary: 120 mg/dL — ABNORMAL HIGH (ref 65–99)

## 2016-07-20 LAB — PROTIME-INR
INR: 1.41
PROTHROMBIN TIME: 17.3 s — AB (ref 11.4–15.2)

## 2016-07-20 LAB — HEPARIN LEVEL (UNFRACTIONATED): HEPARIN UNFRACTIONATED: 0.44 [IU]/mL (ref 0.30–0.70)

## 2016-07-20 LAB — CBC
HCT: 39.4 % (ref 39.0–52.0)
Hemoglobin: 12.5 g/dL — ABNORMAL LOW (ref 13.0–17.0)
MCH: 30.6 pg (ref 26.0–34.0)
MCHC: 31.7 g/dL (ref 30.0–36.0)
MCV: 96.3 fL (ref 78.0–100.0)
PLATELETS: 292 10*3/uL (ref 150–400)
RBC: 4.09 MIL/uL — AB (ref 4.22–5.81)
RDW: 14.3 % (ref 11.5–15.5)
WBC: 14.1 10*3/uL — AB (ref 4.0–10.5)

## 2016-07-20 MED ORDER — WARFARIN SODIUM 7.5 MG PO TABS: 12.5000 mg | ORAL_TABLET | Freq: Once | ORAL | Status: DC

## 2016-07-20 MED ORDER — POTASSIUM CHLORIDE 20 MEQ PO PACK
40.0000 meq | PACK | ORAL | Status: AC
  Administered 2016-07-20 (×2): 40 meq via ORAL
  Filled 2016-07-20 (×2): qty 2

## 2016-07-20 MED ORDER — WARFARIN SODIUM 5 MG PO TABS
10.0000 mg | ORAL_TABLET | Freq: Once | ORAL | Status: AC
  Administered 2016-07-20: 10 mg via ORAL
  Filled 2016-07-20: qty 2

## 2016-07-20 NOTE — Progress Notes (Signed)
eLink Physician-Brief Progress Note Patient Name: Allen Hamilton DOB: 1933-05-27 MRN: [id number]   Date of Service  07/20/2016  HPI/Events of Note  Hypokalemia  Recent Labs Lab 07/13/16 0725 07/13/16 1623 07/14/16 0539 07/14/16 1640 07/15/16 0831 07/16/16 0518 07/18/16 0250 07/19/16 0223 07/20/16 0125  NA 141  --  142  --  140 141 141 140 144  K 3.7  --  3.7  --  4.7 3.7 4.0 4.1 3.1*  CL 101  --  101  --  101 98* 100* 99* 98*  CO2 32  --  29  --  33* 35* 32 35* 37*  GLUCOSE 115*  --  104*  --  125* 112* 103* 108* 122*  BUN 18  --  23*  --  22* 30* 34* 48* 55*  CREATININE 0.94  --  0.70  --  0.75 0.85 0.86 1.09 1.00  CALCIUM 8.3*  --  8.3*  --  8.3* 8.7* 8.8* 8.6* 8.4*  MG 1.8 1.8 1.8 2.0  --   --   --   --   --   PHOS 2.6 3.1 3.2 3.8  --   --   --   --   --      eICU Interventions  replaced     Intervention Category Intermediate Interventions: Electrolyte abnormality - evaluation and management  Maisee Vollman S. 07/20/2016, 2:46 AM

## 2016-07-20 NOTE — Progress Notes (Signed)
Spoke with floor RN  And inform PICC will be done in am.

## 2016-07-20 NOTE — Progress Notes (Signed)
Paged E-Link MD regarding patient's AM Potassium level of 3.1. Awaiting return call.  Allen Hamilton

## 2016-07-20 NOTE — Care Management Important Message (Signed)
Important Message  Patient Details  Name: Allen Hamilton MRN: [id number] Date of Birth: Mar 20, 1933   Medicare Important Message Given:  Yes    Bernadette HoitShoffner, Malay Fantroy Coleman 07/20/2016, 1:24 PM

## 2016-07-20 NOTE — Progress Notes (Addendum)
ANTICOAGULATION CONSULT NOTE - f/u Consult  Pharmacy Consult for Warfarin/Heparin Indication: New onset afib, prior DVT  Allergies  Allergen Reactions  . Hydromorphone     Respiratory depression    Patient Measurements: Height: 5\' 10"  (177.8 cm) Weight: 242 lb 4.6 oz (109.9 kg) IBW/kg (Calculated) : 73 Heparin Dosing Weight: 98.6  Vital Signs: Temp: 99.9 F (37.7 C) (08/11 0812) Temp Source: Axillary (08/11 0812) BP: 99/52 (08/11 0700) Pulse Rate: 75 (08/11 0715)  Labs:  Recent Labs  07/18/16 0250 07/19/16 0223 07/19/16 0929 07/19/16 1457 07/19/16 1856 07/20/16 0125  HGB 13.2 12.8*  --   --   --  12.5*  HCT 41.2 40.7  --   --   --  39.4  PLT 297 317  --   --   --  292  LABPROT 16.5* 16.9*  --   --   --  17.3*  INR 1.32 1.36  --   --   --  1.41  HEPARINUNFRC 0.63  --   --   --  0.50 0.44  CREATININE 0.86 1.09  --   --   --  1.00  TROPONINI  --   --  <0.03 <0.03  --   --     Estimated Creatinine Clearance: 73.2 mL/min (by C-G formula based on SCr of 1 mg/dL).   Assessment:  82 yo male with Hx DVTs on chronic coumadin PTA.  Also with new onset Afib with RVR. Pharmacy is consulted to dose heparin and warfarin bridge. HL 0.44 therapeutic this AM on 1750 units/hr, INR 1.41. CBC stable, no issues per RN.  Of note, pt started on amiodarone drip last night which interacts with warfarin (can increase anticoagulation effect).  Will decrease dose for tonight due to interaction.   **PTA warfarin dose: 8 mg Mon/Thurs, 9 mg AOD  Goal of Therapy:  INR 2-3 Monitor platelets by anticoagulation protocol: Yes    Plan:  - Continue heparin infusion at 1750 units/hr - Warfarin 10 mg PO x 1 tonight - Daily INR, CBC, HL - Monitor s/s of bleeding  Tamalyn Wadsworth L. Roseanne RenoStewart, PharmD Clinical Pharmacist Pager: [phone] 07/20/2016 10:17 AM

## 2016-07-20 NOTE — Progress Notes (Signed)
PULMONARY / CRITICAL CARE MEDICINE   Name: Allen Hamilton MRN: [id number] DOB: 1933/04/07    ADMISSION DATE:  07/12/2016 CONSULTATION DATE:  07/12/16  [facility]  CHIEF COMPLAINT:  C1 fracture s/p fall  BRIEF: 82 y/o male with HTN, HLD and DVT on coumadin here with a C1 fracture post fall, intubated but neurologically intact, has RLL aspiration pneumonia and mucus plugging.   SUBJECTIVE:  Remains critically ill , in  AF-RVR Febrile 102 Loose stools   VITAL SIGNS: BP (!) 99/52   Pulse 75   Temp 99.9 F (37.7 C) (Axillary)   Resp 14   Ht 5\' 10"  (1.778 m)   Wt 242 lb 4.6 oz (109.9 kg)   SpO2 97%   BMI 34.76 kg/m   HEMODYNAMICS:    VENTILATOR SETTINGS: Vent Mode: PRVC FiO2 (%):  [40 %-60 %] 50 % Set Rate:  [14 bmp] 14 bmp Vt Set:  [510 mL] 510 mL PEEP:  [5 cmH20] 5 cmH20 Plateau Pressure:  [18 cmH20-22 cmH20] 19 cmH20  INTAKE / OUTPUT: I/O last 3 completed shifts: In: 3791.9 [I.V.:1951.9; NG/GT:1440; IV Piggyback:400] Out: 3660 [Urine:3500; Emesis/NG output:150; Blood:10]   PHYSICAL EXAMINATION: General: sedated  on vent HENT: NCAT hard collar , new bovona  PULM:decreased bs bases  CV: RRR, no mgr GI: BS+, soft, nontender Neuro: RASS 0  off diprivan Derm: no rash or skin breakdown  LABS:  BMET  Recent Labs Lab 07/18/16 0250 07/19/16 0223 07/20/16 0125  NA 141 140 144  K 4.0 4.1 3.1*  CL 100* 99* 98*  CO2 32 35* 37*  BUN 34* 48* 55*  CREATININE 0.86 1.09 1.00  GLUCOSE 103* 108* 122*    Electrolytes  Recent Labs Lab 07/13/16 1623 07/14/16 0539 07/14/16 1640  07/18/16 0250 07/19/16 0223 07/20/16 0125  CALCIUM  --  8.3*  --   < > 8.8* 8.6* 8.4*  MG 1.8 1.8 2.0  --   --   --   --   PHOS 3.1 3.2 3.8  --   --   --   --   < > = values in this interval not displayed.  CBC  Recent Labs Lab 07/18/16 0250 07/19/16 0223 07/20/16 0125  WBC 17.8* 15.1* 14.1*  HGB 13.2 12.8* 12.5*  HCT 41.2 40.7 39.4  PLT 297 317 292     Coag's  Recent Labs Lab 07/18/16 0250 07/19/16 0223 07/20/16 0125  INR 1.32 1.36 1.41    Sepsis Markers No results for input(s): LATICACIDVEN, PROCALCITON, O2SATVEN in the last 168 hours.  ABG  Recent Labs Lab 07/17/16 1034 07/18/16 0340 07/19/16 0510  PHART 7.384 7.455* 7.495*  PCO2ART 59.7* 51.2* 45.9*  PO2ART 77.0* 74.1* 59.1*    Liver Enzymes No results for input(s): AST, ALT, ALKPHOS, BILITOT, ALBUMIN in the last 168 hours.  Cardiac Enzymes  Recent Labs Lab 07/19/16 0929 07/19/16 1457  TROPONINI <0.03 <0.03    Glucose  Recent Labs Lab 07/19/16 1203 07/19/16 1637 07/19/16 1930 07/19/16 2344 07/20/16 0336 07/20/16 0750  GLUCAP 124* 121* 118* 115* 119* 120*    Imaging Dg Chest Port 1 View  Result Date: 07/19/2016 CLINICAL DATA:  Tracheostomy placement EXAM: PORTABLE CHEST 1 VIEW COMPARISON:  Chest radiograph from earlier today. FINDINGS: Tracheostomy tube tip overlies the tracheal air column at the thoracic inlet approximately 4.9 cm above the carina. Enteric tube enters stomach with the tip not seen on this image. Stable cardiomediastinal silhouette with mild cardiomegaly. No pneumothorax. Stable small  bilateral pleural effusions. No overt pulmonary edema. Stable patchy bibasilar lung opacities. IMPRESSION: 1. Well-positioned tracheostomy tube. 2. Stable mild cardiomegaly without overt pulmonary edema. 3. Stable small bilateral pleural effusions and patchy bibasilar lung opacities favoring atelectasis. Electronically Signed   By: Delbert Phenix M.D.   On: 07/19/2016 09:31   Dg Abd Portable 1v  Result Date: 07/19/2016 CLINICAL DATA:  Feeding tube placement. EXAM: PORTABLE ABDOMEN - 1 VIEW COMPARISON:  None. FINDINGS: Feeding tube tip is in the duodenum. Bowel gas pattern is normal. 25 mm calcified gallstone as well as a tiny gallstone. Bones are normal. IMPRESSION: Feeding tube in the duodenum.  Gallstones. Electronically Signed   By: Francene Boyers M.D.    On: 07/19/2016 16:17     STUDIES:  CT head 8/2 > displaced fx of C1 along with fx of C6  CULTURES: Sputum 8/3 > few GNR, nl flora Gonorrhea 8/3 >neg Chlamydia 8/3 >neg 8/8 sputum>> nml flora  ANTIBIOTICS: Unasyn 8/3 > 8/10  SIGNIFICANT EVENTS: 8/2 > admitted at [facility] with C1 and C6 fx, intubated for resp insufficiency 8/3 > transferred to [facility] ICU 8/11 new onset AF-RVR  LINES/TUBES: ETT 8/3 > 8/8 Re-ETT 8/8 >> tstomy 8/10 >>  DISCUSSION: 82 y.o. M who suffered mechanical fall 8/1 and sustained C1 and C6 fx's.  He required intubation due to concern for airway compromise 8/3 and was later transferred to [facility] for further evaluation and management. Failed extubation 8/8 & ENT Trach 8/10  ASSESSMENT / PLAN:  NEUROLOGIC A:   Acute encephalopathy > improving Displaced C1 and C6 fx's - s/p mechanical fall Seen by neurosurgery, no role for surgery.   P:   Sedation: propofol gtt + fentanyl PRN RASS goal: 0  Continue C collar x 6 weeks  PULMONARY A: Acute respiratory failure with hypoxemia  Aspiration pneumonia RLL Collapse/atelectasis RLL Atelectasis, collapse resolved rt / asp 8/8 failed extubation P:   SBTs with transition to ATC  As tolerated Pulmonary toilet    CARDIOVASCULAR A:  Hx HTN, HLD, DVT on coumadin 8/10 New onset AF-RVR P:  Amio gtt Lopressor Prn Continue outpatient atorvastatin Hold outpatient lisinopril, niacin Resumed  heparin drip   RENAL Lab Results  Component Value Date   CREATININE 1.00 07/20/2016   CREATININE 1.09 07/19/2016   CREATININE 0.86 07/18/2016    A:   AKI - resolved,  Hypokalemia P:   Monitor BMET and UOP Replace electrolytes as needed    GASTROINTESTINAL A:   GI prophylaxis Nutrition P:   SUP: Pantoprazole ct  TF  HEMATOLOGIC A:   On chronic coumadin for chronic DVT  P:  NSGY Ok with restart anticoagulation, restarted 8/6 Follow CBC  INFECTIOUS A:   Aspiration pneumonia RLL  Penile  discharge Loose stools , doubt c diff more likely abx or nutrition related P:   Off unasyn Since cultures neg, check c diff  ENDOCRINE CBG (last 3)   Recent Labs  07/19/16 2344 07/20/16 0336 07/20/16 0750  GLUCAP 115* 119* 120*     A:   At risk hyperglycemia   P:   Continue ssi  Family updated:  8/10, family hopeful to transfer him to [place] for rehab eventually  Interdisciplinary Family Meeting v Palliative Care Meeting:  NA  Cc time x 40m  Cyril Mourning MD. [facility]. [facility] Pager [phone] If no response call [phone]     07/20/2016, 8:18 AM

## 2016-07-20 NOTE — Progress Notes (Signed)
Patient was bagged and lavaged due to increased PIP, rhonchi on auscultation, and audible sounds of secretions.   Patient tolerated well, moderate amount of thick pink tinged secretions removed.  RN at bedside throughout.  MD notified, orders received to give mucomyst.

## 2016-07-21 DIAGNOSIS — J9690 Respiratory failure, unspecified, unspecified whether with hypoxia or hypercapnia: Secondary | ICD-10-CM

## 2016-07-21 DIAGNOSIS — Z930 Tracheostomy status: Secondary | ICD-10-CM

## 2016-07-21 LAB — GLUCOSE, CAPILLARY
GLUCOSE-CAPILLARY: 124 mg/dL — AB (ref 65–99)
GLUCOSE-CAPILLARY: 127 mg/dL — AB (ref 65–99)
Glucose-Capillary: 115 mg/dL — ABNORMAL HIGH (ref 65–99)
Glucose-Capillary: 120 mg/dL — ABNORMAL HIGH (ref 65–99)
Glucose-Capillary: 131 mg/dL — ABNORMAL HIGH (ref 65–99)
Glucose-Capillary: 144 mg/dL — ABNORMAL HIGH (ref 65–99)

## 2016-07-21 LAB — CBC
HCT: 38.3 % — ABNORMAL LOW (ref 39.0–52.0)
Hemoglobin: 11.7 g/dL — ABNORMAL LOW (ref 13.0–17.0)
MCH: 29.8 pg (ref 26.0–34.0)
MCHC: 30.5 g/dL (ref 30.0–36.0)
MCV: 97.5 fL (ref 78.0–100.0)
PLATELETS: 250 10*3/uL (ref 150–400)
RBC: 3.93 MIL/uL — ABNORMAL LOW (ref 4.22–5.81)
RDW: 14.5 % (ref 11.5–15.5)
WBC: 13.9 10*3/uL — ABNORMAL HIGH (ref 4.0–10.5)

## 2016-07-21 LAB — HEPARIN LEVEL (UNFRACTIONATED)
HEPARIN UNFRACTIONATED: 0.4 [IU]/mL (ref 0.30–0.70)
Heparin Unfractionated: 0.16 IU/mL — ABNORMAL LOW (ref 0.30–0.70)
Heparin Unfractionated: 0.57 IU/mL (ref 0.30–0.70)

## 2016-07-21 LAB — BASIC METABOLIC PANEL
Anion gap: 10 (ref 5–15)
BUN: 49 mg/dL — ABNORMAL HIGH (ref 6–20)
CALCIUM: 8.7 mg/dL — AB (ref 8.9–10.3)
CHLORIDE: 100 mmol/L — AB (ref 101–111)
CO2: 34 mmol/L — AB (ref 22–32)
CREATININE: 0.88 mg/dL (ref 0.61–1.24)
GFR calc Af Amer: >60 mL/min (ref >60)
GFR calc non Af Amer: >60 mL/min (ref >60)
GLUCOSE: 126 mg/dL — AB (ref 65–99)
Potassium: 3.5 mmol/L (ref 3.5–5.1)
Sodium: 144 mmol/L (ref 135–145)

## 2016-07-21 LAB — PROTIME-INR
INR: 1.37
Prothrombin Time: 17 seconds — ABNORMAL HIGH (ref 11.4–15.2)

## 2016-07-21 LAB — TRIGLYCERIDES: Triglycerides: 71 mg/dL (ref <150)

## 2016-07-21 MED ORDER — POTASSIUM CHLORIDE 20 MEQ/15ML (10%) PO SOLN
40.0000 meq | Freq: Once | ORAL | Status: AC
  Administered 2016-07-21: 40 meq
  Filled 2016-07-21: qty 30

## 2016-07-21 MED ORDER — ACETYLCYSTEINE 20 % IN SOLN
4.0000 mL | Freq: Four times a day (QID) | RESPIRATORY_TRACT | Status: AC
  Administered 2016-07-21 (×2): 4 mL via RESPIRATORY_TRACT

## 2016-07-21 MED ORDER — WARFARIN SODIUM 5 MG PO TABS
12.5000 mg | ORAL_TABLET | Freq: Once | ORAL | Status: AC
  Administered 2016-07-21: 12.5 mg via ORAL
  Filled 2016-07-21: qty 1

## 2016-07-21 MED ORDER — ACETYLCYSTEINE 10 % IN SOLN
4.0000 mL | Freq: Four times a day (QID) | RESPIRATORY_TRACT | Status: DC
  Filled 2016-07-21 (×3): qty 4

## 2016-07-21 NOTE — Progress Notes (Signed)
ANTICOAGULATION CONSULT NOTE - f/u Consult  Pharmacy Consult for Warfarin/Heparin Indication: New onset afib, prior DVT  Allergies  Allergen Reactions  . Hydromorphone     Respiratory depression    Patient Measurements: Height: 5\' 10"  (177.8 cm) Weight: 242 lb 4.6 oz (109.9 kg) IBW/kg (Calculated) : 73 Heparin Dosing Weight: 98.6  Vital Signs: Temp: 99.9 F (37.7 C) (08/12 0338) Temp Source: Oral (08/12 0338) BP: 91/57 (08/12 0400) Pulse Rate: 44 (08/12 0400)  Labs:  Recent Labs  07/19/16 0223 07/19/16 0929 07/19/16 1457 07/19/16 1856 07/20/16 0125 07/21/16 0305  HGB 12.8*  --   --   --  12.5* 11.7*  HCT 40.7  --   --   --  39.4 38.3*  PLT 317  --   --   --  292 250  LABPROT 16.9*  --   --   --  17.3* 17.0*  INR 1.36  --   --   --  1.41 1.37  HEPARINUNFRC  --   --   --  0.50 0.44 0.16*  CREATININE 1.09  --   --   --  1.00 0.88  TROPONINI  --  <0.03 <0.03  --   --   --     Estimated Creatinine Clearance: 83.1 mL/min (by C-G formula based on SCr of 0.88 mg/dL).   Assessment:  82 yo male with Hx DVTs on chronic coumadin PTA.  Also with new onset Afib with RVR. Pharmacy is consulted to dose heparin and warfarin bridge. Heparin level down to 0.16 (subtherapeutic) on 1750 units/hr. Has been therapeutic on this dose for multiple days. No issues with line or bleeding reported per RN. INR remains subtherapeutic - actually slight trend downward. Noted addition of amio which can increase warfarin's anticoagulation effects.   **PTA warfarin dose: 8 mg Mon/Thurs, 9 mg AOD  Goal of Therapy:  INR 2-3 Monitor platelets by anticoagulation protocol: Yes    Plan:  - Increase heparin infusion at 1950 units/hr - Will f/u heparin level in 8 hours - Warfarin 12.5 mg PO x 1 tonight  Christoper Fabianaron Goran Olden, PharmD, BCPS Clinical pharmacist, pager [phone] 07/21/2016 4:27 AM

## 2016-07-21 NOTE — Progress Notes (Signed)
ANTICOAGULATION CONSULT NOTE - f/u Consult  Pharmacy Consult for Warfarin/Heparin Indication: New onset afib, prior DVT  Allergies  Allergen Reactions  . Hydromorphone     Respiratory depression    Patient Measurements: Height: 5\' 10"  (177.8 cm) Weight: 242 lb 4.6 oz (109.9 kg) IBW/kg (Calculated) : 73 Heparin Dosing Weight: 98.6  Vital Signs: Temp: 99 F (37.2 C) (08/12 1132) Temp Source: Axillary (08/12 1132) BP: 92/56 (08/12 0500) Pulse Rate: 84 (08/12 1129)  Labs:  Recent Labs  07/19/16 0223 07/19/16 0929 07/19/16 1457  07/20/16 0125 07/21/16 0305 07/21/16 1228  HGB 12.8*  --   --   --  12.5* 11.7*  --   HCT 40.7  --   --   --  39.4 38.3*  --   PLT 317  --   --   --  292 250  --   LABPROT 16.9*  --   --   --  17.3* 17.0*  --   INR 1.36  --   --   --  1.41 1.37  --   HEPARINUNFRC  --   --   --   < > 0.44 0.16* 0.40  CREATININE 1.09  --   --   --  1.00 0.88  --   TROPONINI  --  <0.03 <0.03  --   --   --   --   < > = values in this interval not displayed.  Estimated Creatinine Clearance: 83.1 mL/min (by C-G formula based on SCr of 0.88 mg/dL).   Assessment:  82 yo male with Hx DVTs on chronic coumadin PTA.  Also with new onset Afib with RVR. Pharmacy is consulted to dose heparin and warfarin bridge. Heparin level now therapeutic at 0.40 on 1950 units/hr. HL increased from 0.16 to 0.40 in 8 hours- will order confirmatory level.  **PTA warfarin dose: 8 mg Mon/Thurs, 9 mg AOD  Goal of Therapy:  INR 2-3 Monitor platelets by anticoagulation protocol: Yes    Plan:  - Heparin gtt at 1950 units/hr  - Will f/u heparin level in 6 hours - Warfarin 12.5 mg PO x 1 tonight - Daily INR, HL, CBC, s/sx of bleeding   Louie Bostonmily Stewart, Pharm D Pharmacy Resident [phone] 07/21/2016 2:09 PM

## 2016-07-21 NOTE — Progress Notes (Signed)
eLink Physician-Brief Progress Note Patient Name: Allen Hamilton DOB: 01-30-1933 MRN: [id number]   Date of Service  07/21/2016  HPI/Events of Note  Secretions per RT  eICU Interventions  Mucomyst nebs Bag-lavage     Intervention Category Intermediate Interventions: Respiratory distress - evaluation and management  ALVA,RAKESH V. 07/21/2016, 12:03 AM

## 2016-07-21 NOTE — Progress Notes (Signed)
PULMONARY / CRITICAL CARE MEDICINE   Name: Allen Hamilton Basford MRN: [id number] DOB: 12-Sep-1933    ADMISSION DATE:  07/12/2016 CONSULTATION DATE:  07/12/16  [facility]  [facility]  CHIEF COMPLAINT:  C1 fracture s/p fall  BRIEF: 82 y/o male with HTN, HLD and DVT on coumadin here with a C1 fracture post fall, intubated but neurologically intact, has RLL aspiration pneumonia and mucus plugging.   SUBJECTIVE:  Remains critically ill , in  AF-RVR Sedated on vent Loose stools   VITAL SIGNS: BP (!) 92/56   Pulse 77   Temp 98.8 F (37.1 C) (Axillary)   Resp 14   Ht 5\' 10"  (1.778 m)   Wt 242 lb 4.6 oz (109.9 kg)   SpO2 97%   BMI 34.76 kg/m   HEMODYNAMICS:    VENTILATOR SETTINGS: Vent Mode: PRVC FiO2 (%):  [50 %] 50 % Set Rate:  [14 bmp] 14 bmp Vt Set:  [510 mL] 510 mL PEEP:  [5 cmH20] 5 cmH20 Plateau Pressure:  [16 cmH20-20 cmH20] 18 cmH20  INTAKE / OUTPUT: I/O last 3 completed shifts: In: 3133.7 [I.V.:1633.7; Other:30; NG/GT:1470] Out: 3005 [Urine:3005]   PHYSICAL EXAMINATION: General: sedated  on vent HENT: NCAT hard collar , new bovona  PULM:decreased bs bases  CV: RRR, no mgr GI: BS+, soft, nontender Neuro: RASS -2 on diprivan Derm: no rash or skin breakdown  LABS:  BMET  Recent Labs Lab 07/19/16 0223 07/20/16 0125 07/21/16 0305  NA 140 144 144  K 4.1 3.1* 3.5  CL 99* 98* 100*  CO2 35* 37* 34*  BUN 48* 55* 49*  CREATININE 1.09 1.00 0.88  GLUCOSE 108* 122* 126*    Electrolytes  Recent Labs Lab 07/14/16 1640  07/19/16 0223 07/20/16 0125 07/21/16 0305  CALCIUM  --   < > 8.6* 8.4* 8.7*  MG 2.0  --   --   --   --   PHOS 3.8  --   --   --   --   < > = values in this interval not displayed.  CBC  Recent Labs Lab 07/19/16 0223 07/20/16 0125 07/21/16 0305  WBC 15.1* 14.1* 13.9*  HGB 12.8* 12.5* 11.7*  HCT 40.7 39.4 38.3*  PLT 317 292 250    Coag's  Recent Labs Lab 07/19/16 0223 07/20/16 0125 07/21/16 0305  INR 1.36 1.41  1.37    Sepsis Markers No results for input(s): LATICACIDVEN, PROCALCITON, O2SATVEN in the last 168 hours.  ABG  Recent Labs Lab 07/17/16 1034 07/18/16 0340 07/19/16 0510  PHART 7.384 7.455* 7.495*  PCO2ART 59.7* 51.2* 45.9*  PO2ART 77.0* 74.1* 59.1*    Liver Enzymes No results for input(s): AST, ALT, ALKPHOS, BILITOT, ALBUMIN in the last 168 hours.  Cardiac Enzymes  Recent Labs Lab 07/19/16 0929 07/19/16 1457  TROPONINI <0.03 <0.03    Glucose  Recent Labs Lab 07/20/16 0750 07/20/16 1207 07/20/16 1555 07/20/16 2004 07/21/16 0006 07/21/16 0733  GLUCAP 120* 115* 115* 112* 120* 144*    Imaging No results found.   STUDIES:  CT head 8/2 > displaced fx of C1 along with fx of C6  CULTURES: Sputum 8/3 > few GNR, nl flora Gonorrhea 8/3 >neg Chlamydia 8/3 >neg 8/8 sputum>> nml flora 8/10 c dif>>neg  ANTIBIOTICS: Unasyn 8/3 > 8/10  SIGNIFICANT EVENTS: 8/2 > [facility] and C6 fx, intubated for resp insufficiency 8/3 > [facility] 8/11 new onset AF-RVR  LINES/TUBES: ETT 8/3 > 8/8 Re-ETT 8/8 >>  tstomy 8/10 >>  DISCUSSION: 82 y.o. M who suffered mechanical fall 8/1 and sustained C1 and C6 fx's.  He required intubation due to concern for airway compromise 8/3 and was later transferred to [facility] for further evaluation and management. Failed extubation 8/8 & ENT Trach 8/10  ASSESSMENT / PLAN:  NEUROLOGIC A:   Acute encephalopathy > improving Displaced C1 and C6 fx's - s/p mechanical fall Seen by neurosurgery, no role for surgery.   P:   Sedation: propofol gtt + fentanyl PRN RASS goal: 0  Continue C collar x 6 weeks  PULMONARY A: Acute respiratory failure with hypoxemia  Aspiration pneumonia RLL Collapse/atelectasis RLL Atelectasis, collapse resolved rt / asp 8/8 failed extubation P:   SBTs with transition to ATC  As tolerated Pulmonary toilet    CARDIOVASCULAR A:  Hx HTN, HLD, DVT on coumadin 8/10 New onset  AF-RVR P:  Amio gtt Lopressor Prn Continue outpatient atorvastatin Hold outpatient lisinopril, niacin Resumed  heparin drip   RENAL Lab Results  Component Value Date   CREATININE 0.88 07/21/2016   CREATININE 1.00 07/20/2016   CREATININE 1.09 07/19/2016    Recent Labs Lab 07/19/16 0223 07/20/16 0125 07/21/16 0305  K 4.1 3.1* 3.5      A:   AKI - resolved,  Hypokalemia P:   Monitor BMET and UOP Replace electrolytes as needed    GASTROINTESTINAL A:   GI prophylaxis Nutrition P:   SUP: Pantoprazole ct  TF  HEMATOLOGIC  Recent Labs  07/20/16 0125 07/21/16 0305  HGB 12.5* 11.7*    A:   On chronic coumadin for chronic DVT  P:  NSGY Ok with restart anticoagulation, restarted 8/6 Follow CBC  INFECTIOUS A:   Aspiration pneumonia RLL  Penile discharge Loose stools , doubt c diff more likely abx or nutrition related, c dif neg P:   Off unasyn Since cultures neg, check c diff->neg  ENDOCRINE CBG (last 3)   Recent Labs  07/20/16 2004 07/21/16 0006 07/21/16 0733  GLUCAP 112* 120* 144*     A:   At risk hyperglycemia   P:   Continue ssi  Family updated:  8/10, family hopeful to transfer him to [place] for rehab eventually  Interdisciplinary Family Meeting v Palliative Care Meeting:  NA  [facility] ACNP [facility] PCCM Pager [phone] till 3 pm If no answer page [phone] 07/21/2016, 10:41 AM

## 2016-07-21 NOTE — Progress Notes (Signed)
ANTICOAGULATION CONSULT NOTE - f/u Consult  Pharmacy Consult for Warfarin/Heparin Indication: New onset afib, prior DVT  Allergies  Allergen Reactions  . Hydromorphone     Respiratory depression    Patient Measurements: Height: 5\' 10"  (177.8 cm) Weight: 242 lb 4.6 oz (109.9 kg) IBW/kg (Calculated) : 73 Heparin Dosing Weight: 98.6  Vital Signs: Temp: 101.2 F (38.4 C) (08/12 1558) Temp Source: Axillary (08/12 1558) Pulse Rate: 89 (08/12 1559)  Labs:  Recent Labs  07/19/16 0223 07/19/16 0929 07/19/16 1457  07/20/16 0125 07/21/16 0305 07/21/16 1228 07/21/16 1827  HGB 12.8*  --   --   --  12.5* 11.7*  --   --   HCT 40.7  --   --   --  39.4 38.3*  --   --   PLT 317  --   --   --  292 250  --   --   LABPROT 16.9*  --   --   --  17.3* 17.0*  --   --   INR 1.36  --   --   --  1.41 1.37  --   --   HEPARINUNFRC  --   --   --   < > 0.44 0.16* 0.40 0.57  CREATININE 1.09  --   --   --  1.00 0.88  --   --   TROPONINI  --  <0.03 <0.03  --   --   --   --   --   < > = values in this interval not displayed.  Estimated Creatinine Clearance: 83.1 mL/min (by C-G formula based on SCr of 0.88 mg/dL).   Assessment:  82 yo male with Hx DVTs on chronic coumadin PTA.  Also with new onset Afib with RVR. Pharmacy is consulted to dose heparin and warfarin bridge. Heparin level now therapeutic at 0.40 on 1950 units/hr. HL increased from 0.16 to 0.40 in 8 hours- will order confirmatory level.  **PTA warfarin dose: 8 mg Mon/Thurs, 9 mg AOD  Repeat HL remains therapeutic at 0.57 on heparin 1950 units/hr. No issues with infusion or bleeding noted.  Goal of Therapy:  INR 2-3 Monitor platelets by anticoagulation protocol: Yes    Plan:  Continue heparin 1950 units/hr  Warfarin 12.5 mg PO x 1 tonight Daily INR, HL, CBC, s/sx of bleeding   Arlean Hoppingorey M. Newman PiesBall, PharmD, BCPS Clinical Pharmacist Pager [phone] 07/21/2016 7:13 PM

## 2016-07-21 NOTE — Progress Notes (Signed)
Attempt to insert PICC unsuccessful.  Several attempts with different techniques including power flushes and guide wire tried.  Will not go centrally meeting some type of obstruction.  Floor RN notified.

## 2016-07-22 ENCOUNTER — Encounter (HOSPITAL_COMMUNITY): Payer: Self-pay | Admitting: General Practice

## 2016-07-22 ENCOUNTER — Inpatient Hospital Stay (HOSPITAL_COMMUNITY): Payer: Medicare Other

## 2016-07-22 LAB — BASIC METABOLIC PANEL
ANION GAP: 7 (ref 5–15)
BUN: 48 mg/dL — AB (ref 6–20)
CALCIUM: 8.7 mg/dL — AB (ref 8.9–10.3)
CO2: 36 mmol/L — ABNORMAL HIGH (ref 22–32)
Chloride: 101 mmol/L (ref 101–111)
Creatinine, Ser: 0.83 mg/dL (ref 0.61–1.24)
GFR calc Af Amer: >60 mL/min (ref >60)
Glucose, Bld: 118 mg/dL — ABNORMAL HIGH (ref 65–99)
POTASSIUM: 3.4 mmol/L — AB (ref 3.5–5.1)
SODIUM: 144 mmol/L (ref 135–145)

## 2016-07-22 LAB — GLUCOSE, CAPILLARY
GLUCOSE-CAPILLARY: 111 mg/dL — AB (ref 65–99)
Glucose-Capillary: 115 mg/dL — ABNORMAL HIGH (ref 65–99)
Glucose-Capillary: 121 mg/dL — ABNORMAL HIGH (ref 65–99)
Glucose-Capillary: 116 mg/dL — ABNORMAL HIGH (ref 65–99)
Glucose-Capillary: 111 mg/dL — ABNORMAL HIGH (ref 65–99)
Glucose-Capillary: 115 mg/dL — ABNORMAL HIGH (ref 65–99)

## 2016-07-22 LAB — LACTIC ACID, PLASMA: Lactic Acid, Venous: 0.9 mmol/L (ref 0.5–1.9)

## 2016-07-22 LAB — CK TOTAL AND CKMB (NOT AT ARMC)
CK, MB: 0.7 ng/mL (ref 0.5–5.0)
Relative Index: 0.5 (ref 0.0–2.5)
Total CK: 153 U/L (ref 49–397)

## 2016-07-22 LAB — CBC
HCT: 38.6 % — ABNORMAL LOW (ref 39.0–52.0)
HEMOGLOBIN: 12 g/dL — AB (ref 13.0–17.0)
MCH: 30.5 pg (ref 26.0–34.0)
MCHC: 31.1 g/dL (ref 30.0–36.0)
MCV: 98.2 fL (ref 78.0–100.0)
Platelets: 300 10*3/uL (ref 150–400)
RBC: 3.93 MIL/uL — AB (ref 4.22–5.81)
RDW: 14.5 % (ref 11.5–15.5)
WBC: 15 10*3/uL — ABNORMAL HIGH (ref 4.0–10.5)

## 2016-07-22 LAB — PROTIME-INR
INR: 1.55
PROTHROMBIN TIME: 18.7 s — AB (ref 11.4–15.2)

## 2016-07-22 LAB — HEPARIN LEVEL (UNFRACTIONATED): HEPARIN UNFRACTIONATED: 0.6 [IU]/mL (ref 0.30–0.70)

## 2016-07-22 LAB — PHOSPHORUS: Phosphorus: 2.8 mg/dL (ref 2.5–4.6)

## 2016-07-22 LAB — PROCALCITONIN: Procalcitonin: 0.12 ng/mL

## 2016-07-22 LAB — MAGNESIUM: MAGNESIUM: 2.4 mg/dL (ref 1.7–2.4)

## 2016-07-22 MED ORDER — POTASSIUM CHLORIDE 20 MEQ/15ML (10%) PO SOLN
20.0000 meq | ORAL | Status: AC
  Administered 2016-07-22 (×2): 20 meq
  Filled 2016-07-22 (×2): qty 15

## 2016-07-22 MED ORDER — FAMOTIDINE 40 MG/5ML PO SUSR
20.0000 mg | Freq: Every day | ORAL | Status: DC
  Administered 2016-07-22 – 2016-07-25 (×4): 20 mg via ORAL
  Filled 2016-07-22 (×4): qty 2.5

## 2016-07-22 MED ORDER — CIPROFLOXACIN HCL 750 MG PO TABS
750.0000 mg | ORAL_TABLET | Freq: Two times a day (BID) | ORAL | Status: DC
  Administered 2016-07-22 – 2016-07-26 (×8): 750 mg
  Filled 2016-07-22 (×8): qty 1

## 2016-07-22 MED ORDER — WARFARIN SODIUM 7.5 MG PO TABS
12.5000 mg | ORAL_TABLET | Freq: Once | ORAL | Status: AC
  Administered 2016-07-22: 12.5 mg via ORAL
  Filled 2016-07-22: qty 1

## 2016-07-22 MED ORDER — FUROSEMIDE 10 MG/ML IJ SOLN
20.0000 mg | Freq: Once | INTRAMUSCULAR | Status: AC
  Administered 2016-07-22: 20 mg via INTRAVENOUS
  Filled 2016-07-22: qty 2

## 2016-07-22 NOTE — Progress Notes (Signed)
eLink Physician-Brief Progress Note Patient Name: Allen Hamilton DOB: Apr 28, 1933 MRN: [id number]   Date of Service  07/22/2016  HPI/Events of Note  Sputum purulent with gnr/ pct only 0.12   eICU Interventions  rx cipro per tube only pending culture results     Intervention Category Major Interventions: Acid-Base disturbance - evaluation and management;Infection - evaluation and management  Sandrea HughsMichael Antonisha Waskey 07/22/2016, 10:55 PM

## 2016-07-22 NOTE — Progress Notes (Signed)
[facility] FOR AM LAB REPLACEMENT ONLY  The patient does [facility] the [facility] based on the criteria listed below:   1. Is GFR >/= 40 ml/min? Yes.    Patient's GFR today is >60 2. Is urine output >/= 0.5 ml/kg/hr for the last 6 hours? Yes.   Patient's UOP is 0.7 ml/kg/hr 3. Is BUN < 60 mg/dL? Yes.    Patient's BUN today is 48 4. Abnormal electrolyte(s): k 3.4 5. Ordered repletion with: per protocol 6. If a panic level lab has been reported, has the CCM MD in charge been notified? No..   Physician:    Markus DaftWHELAN, Vito Beg A 07/22/2016 6:22 AM

## 2016-07-22 NOTE — Progress Notes (Signed)
PULMONARY / CRITICAL CARE MEDICINE   Name: Allen Hamilton MRN: [id number] DOB: [birth year]    ADMISSION DATE:  07/12/2016 CONSULTATION DATE:  07/12/16  [facility]  CHIEF COMPLAINT:  C1 fracture s/p fall  BRIEF: 82 y/o male with HTN, HLD and DVT on coumadin here with a C1 fracture post fall, intubated but neurologically intact, has RLL aspiration pneumonia and mucus plugging.   SUBJECTIVE:  Remains critically ill , in  AF-RVR Sedated on vent For PICC line per IR am 8/14   VITAL SIGNS: BP 115/61   Pulse (!) 104   Temp 98.6 F (37 C) (Oral)   Resp (!) 23   Ht 5\' 10"  (1.778 m)   Wt 242 lb 4.6 oz (109.9 kg)   SpO2 93%   BMI 34.76 kg/m   HEMODYNAMICS:    VENTILATOR SETTINGS: Vent Mode: PRVC FiO2 (%):  [50 %] 50 % Set Rate:  [14 bmp] 14 bmp Vt Set:  [510 mL] 510 mL PEEP:  [5 cmH20] 5 cmH20 Pressure Support:  [10 cmH20] 10 cmH20 Plateau Pressure:  [20 cmH20-24 cmH20] 24 cmH20  INTAKE / OUTPUT: I/O last 3 completed shifts: In: 2841 [I.V.:1771; Other:30; NG/GT:1040] Out: 2810 [Urine:2310; Stool:500]   PHYSICAL EXAMINATION: General: sedated  on vent, but follows commands HENT: NCAT hard collar , new bovona  PULM:decreased bs bases  CV: RRR, no mgr GI: BS+, soft, nontender Neuro: RASS -1 on diprivan Derm: no rash or skin breakdown  LABS:  BMET  Recent Labs Lab 07/20/16 0125 07/21/16 0305 07/22/16 0508  NA 144 144 144  K 3.1* 3.5 3.4*  CL 98* 100* 101  CO2 37* 34* 36*  BUN 55* 49* 48*  CREATININE 1.00 0.88 0.83  GLUCOSE 122* 126* 118*    Electrolytes  Recent Labs Lab 07/20/16 0125 07/21/16 0305 07/22/16 0508  CALCIUM 8.4* 8.7* 8.7*  MG  --   --  2.4  PHOS  --   --  2.8    CBC  Recent Labs Lab 07/20/16 0125 07/21/16 0305 07/22/16 0508  WBC 14.1* 13.9* 15.0*  HGB 12.5* 11.7* 12.0*  HCT 39.4 38.3* 38.6*  PLT 292 250 300    Coag's  Recent Labs Lab 07/20/16 0125 07/21/16 0305 07/22/16 0508  INR 1.41 1.37 1.55     Sepsis Markers  Recent Labs Lab 07/22/16 0508  LATICACIDVEN 0.9    ABG  Recent Labs Lab 07/17/16 1034 07/18/16 0340 07/19/16 0510  PHART 7.384 7.455* 7.495*  PCO2ART 59.7* 51.2* 45.9*  PO2ART 77.0* 74.1* 59.1*    Liver Enzymes No results for input(s): AST, ALT, ALKPHOS, BILITOT, ALBUMIN in the last 168 hours.  Cardiac Enzymes  Recent Labs Lab 07/19/16 0929 07/19/16 1457  TROPONINI <0.03 <0.03    Glucose  Recent Labs Lab 07/21/16 1130 07/21/16 1556 07/21/16 2057 07/21/16 2345 07/22/16 0407 07/22/16 0756  GLUCAP 131* 124* 115* 127* 115* 111*    Imaging Dg Chest Port 1 View  Result Date: 07/22/2016 CLINICAL DATA:  Followup for respiratory failure. EXAM: PORTABLE CHEST 1 VIEW COMPARISON:  07/19/2016 FINDINGS: Right lung base opacity has mildly improved. There still significant bilateral lung base opacity which obscures hemidiaphragms and portions of the heart borders, consistent with moderate pleural effusions. No new lung opacities. No evidence of pulmonary edema. Cardiac silhouette is enlarged, but stable. Tracheostomy tube is stable. Enteric tube now replaces the nasogastric tube. It passes below the diaphragm and below the included field of view. IMPRESSION: 1. Mild improvement in lung  aeration at the right lung base. 2. Persistent moderate bilateral pleural effusions. No convincing pulmonary edema. Electronically Signed   By: Amie Portland M.D.   On: 07/22/2016 07:22     STUDIES:  CT head 8/2 > displaced fx of C1 along with fx of C6  CULTURES: Sputum 8/3 > few GNR, nl flora Gonorrhea 8/3 >neg Chlamydia 8/3 >neg 8/8 sputum>> nml flora 8/10 c dif>>neg  ANTIBIOTICS: Unasyn 8/3 > 8/10  SIGNIFICANT EVENTS: 8/2 > admitted at [facility] with C1 and C6 fx, intubated for resp insufficiency 8/3 > transferred to [facility] ICU 8/11 new onset AF-RVR  LINES/TUBES: ETT 8/3 > 8/8 Re-ETT 8/8 >> tstomy 8/10 >>  DISCUSSION: 82 y.o. M who suffered mechanical fall  8/1 and sustained C1 and C6 fx's.  He required intubation due to concern for airway compromise 8/3 and was later transferred to [facility] for further evaluation and management. Failed extubation 8/8 & ENT Trach 8/10  ASSESSMENT / PLAN:  NEUROLOGIC A:   Acute encephalopathy > improving Displaced C1 and C6 fx's - s/p mechanical fall Seen by neurosurgery, no role for surgery.   P:   Sedation: propofol gtt + fentanyl PRN RASS goal: 0  Continue C collar x 6 weeks  PULMONARY A: Acute respiratory failure with hypoxemia  Aspiration pneumonia RLL Collapse/atelectasis RLL Atelectasis, collapse resolved rt / asp 8/8 failed extubation P:   SBTs with transition to ATC  As tolerated Pulmonary toilet    CARDIOVASCULAR A:  Hx HTN, HLD, DVT on coumadin 8/10 New onset AF-RVR P:  Amio gtt Lopressor Prn Continue outpatient atorvastatin Hold outpatient lisinopril, niacin Resumed  heparin drip   RENAL Lab Results  Component Value Date   CREATININE 0.83 07/22/2016   CREATININE 0.88 07/21/2016   CREATININE 1.00 07/20/2016    Recent Labs Lab 07/20/16 0125 07/21/16 0305 07/22/16 0508  K 3.1* 3.5 3.4*      A:   AKI - resolved,  Hypokalemia P:   Monitor BMET and UOP Replace electrolytes as needed    GASTROINTESTINAL A:   GI prophylaxis Nutrition P:   SUP: Pantoprazole ct  TF  HEMATOLOGIC  Recent Labs  07/21/16 0305 07/22/16 0508  HGB 11.7* 12.0*   Lab Results  Component Value Date   INR 1.55 07/22/2016   INR 1.37 07/21/2016   INR 1.41 07/20/2016     A:   On chronic coumadin for chronic DVT  P:  NSGY Ok with restart anticoagulation, restarted 8/6 Follow CBC For IR PICC line am of 8/14  INFECTIOUS A:   Aspiration pneumonia RLL  Penile discharge Loose stools , doubt c diff more likely abx or nutrition related, c dif neg P:   Off unasyn Since cultures neg, check c diff->neg  ENDOCRINE CBG (last 3)   Recent Labs  07/21/16 2345 07/22/16 0407  07/22/16 0756  GLUCAP 127* 115* 111*     A:   At risk hyperglycemia   P:   Continue ssi  Family updated:  8/10, family hopeful to transfer [place] to [place] for rehab eventually  Interdisciplinary Family Meeting v Palliative Care Meeting:  NA  [facility] Riyah Bardon ACNP Adolph Pollack PCCM Pager [phone] till 3 pm If no answer page [phone] 07/22/2016, 8:47 AM

## 2016-07-22 NOTE — Progress Notes (Signed)
ANTICOAGULATION CONSULT NOTE - f/u Consult  Pharmacy Consult for Warfarin/Heparin Indication: New onset afib, prior DVT  Allergies  Allergen Reactions  . Hydromorphone     Respiratory depression    Patient Measurements: Height: 5\' 10"  (177.8 cm) Weight: 242 lb 4.6 oz (109.9 kg) IBW/kg (Calculated) : 73 Heparin Dosing Weight: 98.6  Vital Signs: Temp: 98.6 F (37 C) (08/13 0800) Temp Source: Oral (08/13 0800) BP: 118/79 (08/13 1103) Pulse Rate: 93 (08/13 1103)  Labs:  Recent Labs  07/19/16 1457  07/20/16 0125 07/21/16 0305 07/21/16 1228 07/21/16 1827 07/22/16 0508  HGB  --   < > 12.5* 11.7*  --   --  12.0*  HCT  --   --  39.4 38.3*  --   --  38.6*  PLT  --   --  292 250  --   --  300  LABPROT  --   --  17.3* 17.0*  --   --  18.7*  INR  --   --  1.41 1.37  --   --  1.55  HEPARINUNFRC  --   < > 0.44 0.16* 0.40 0.57 0.60  CREATININE  --   --  1.00 0.88  --   --  0.83  CKTOTAL  --   --   --   --   --   --  153  CKMB  --   --   --   --   --   --  0.7  TROPONINI <0.03  --   --   --   --   --   --   < > = values in this interval not displayed.  Estimated Creatinine Clearance: 88.2 mL/min (by C-G formula based on SCr of 0.83 mg/dL).   Assessment:  82 yo male with Hx DVTs on chronic coumadin PTA.  Also with new onset Afib with RVR. Pharmacy is consulted to dose heparin and warfarin bridge. Heparin level now therapeutic X 3  at 0.60 on 1950 units/hr  . INR subtherapeutic at 1.55 - up from 1.37. Will re-dose 12.5 mg tonight due to history of patient's INR dropping on 10 mg.   **PTA warfarin dose: 8 mg Mon/Thurs, 9 mg AOD    Goal of Therapy:  INR 2-3 Monitor platelets by anticoagulation protocol: Yes    Plan:  Continue heparin 1950 units/hr  Warfarin 12.5 mg PO x 1 again tonight Daily INR, HL, CBC, s/sx of bleeding   Bailey MechEmily Stewart, Ilda Bassetharm D Pharmacy Resident Pager [phone] 07/22/2016 11:19 AM

## 2016-07-23 ENCOUNTER — Inpatient Hospital Stay (HOSPITAL_COMMUNITY): Payer: Medicare Other

## 2016-07-23 ENCOUNTER — Encounter (HOSPITAL_COMMUNITY): Payer: Self-pay | Admitting: Interventional Radiology

## 2016-07-23 DIAGNOSIS — I481 Persistent atrial fibrillation: Secondary | ICD-10-CM

## 2016-07-23 HISTORY — PX: IR GENERIC HISTORICAL: IMG1180011

## 2016-07-23 LAB — GLUCOSE, CAPILLARY
Glucose-Capillary: 110 mg/dL — ABNORMAL HIGH (ref 65–99)
Glucose-Capillary: 123 mg/dL — ABNORMAL HIGH (ref 65–99)
Glucose-Capillary: 110 mg/dL — ABNORMAL HIGH (ref 65–99)
Glucose-Capillary: 153 mg/dL — ABNORMAL HIGH (ref 65–99)
Glucose-Capillary: 131 mg/dL — ABNORMAL HIGH (ref 65–99)
Glucose-Capillary: 150 mg/dL — ABNORMAL HIGH (ref 65–99)

## 2016-07-23 LAB — BASIC METABOLIC PANEL
ANION GAP: 6 (ref 5–15)
BUN: 48 mg/dL — ABNORMAL HIGH (ref 6–20)
CO2: 38 mmol/L — ABNORMAL HIGH (ref 22–32)
Calcium: 9 mg/dL (ref 8.9–10.3)
Chloride: 104 mmol/L (ref 101–111)
Creatinine, Ser: 0.82 mg/dL (ref 0.61–1.24)
Glucose, Bld: 130 mg/dL — ABNORMAL HIGH (ref 65–99)
POTASSIUM: 3.3 mmol/L — AB (ref 3.5–5.1)
SODIUM: 148 mmol/L — AB (ref 135–145)

## 2016-07-23 LAB — CBC
HEMATOCRIT: 41.5 % (ref 39.0–52.0)
Hemoglobin: 12.7 g/dL — ABNORMAL LOW (ref 13.0–17.0)
MCH: 30.1 pg (ref 26.0–34.0)
MCHC: 30.6 g/dL (ref 30.0–36.0)
MCV: 98.3 fL (ref 78.0–100.0)
Platelets: 288 10*3/uL (ref 150–400)
RBC: 4.22 MIL/uL (ref 4.22–5.81)
RDW: 14.6 % (ref 11.5–15.5)
WBC: 14.5 10*3/uL — AB (ref 4.0–10.5)

## 2016-07-23 LAB — APTT: APTT: 106 s — AB (ref 24–36)

## 2016-07-23 LAB — HEPARIN LEVEL (UNFRACTIONATED)
Heparin Unfractionated: 0.75 IU/mL — ABNORMAL HIGH (ref 0.30–0.70)
Heparin Unfractionated: 0.56 IU/mL (ref 0.30–0.70)
Heparin Unfractionated: 0.69 IU/mL (ref 0.30–0.70)

## 2016-07-23 LAB — PHOSPHORUS: PHOSPHORUS: 3 mg/dL (ref 2.5–4.6)

## 2016-07-23 LAB — PROTIME-INR
INR: 1.65
Prothrombin Time: 19.7 seconds — ABNORMAL HIGH (ref 11.4–15.2)

## 2016-07-23 LAB — PROCALCITONIN: PROCALCITONIN: 0.13 ng/mL

## 2016-07-23 LAB — MAGNESIUM: Magnesium: 2.2 mg/dL (ref 1.7–2.4)

## 2016-07-23 MED ORDER — DEXMEDETOMIDINE HCL IN NACL 200 MCG/50ML IV SOLN
0.4000 ug/kg/h | INTRAVENOUS | Status: DC
  Administered 2016-07-23 (×2): 0.7 ug/kg/h via INTRAVENOUS
  Filled 2016-07-23 (×3): qty 50

## 2016-07-23 MED ORDER — SODIUM CHLORIDE 0.9 % IV SOLN
INTRAVENOUS | Status: DC
  Administered 2016-07-23: 13:00:00 via INTRAVENOUS

## 2016-07-23 MED ORDER — DEXMEDETOMIDINE HCL IN NACL 400 MCG/100ML IV SOLN
0.4000 ug/kg/h | INTRAVENOUS | Status: DC
  Administered 2016-07-23 – 2016-07-24 (×4): 0.7 ug/kg/h via INTRAVENOUS
  Administered 2016-07-25: 0.1 ug/kg/h via INTRAVENOUS
  Filled 2016-07-23 (×4): qty 100

## 2016-07-23 MED ORDER — FREE WATER
200.0000 mL | Status: DC
  Administered 2016-07-23 – 2016-07-26 (×20): 200 mL

## 2016-07-23 MED ORDER — MIDAZOLAM HCL 2 MG/2ML IJ SOLN
2.0000 mg | Freq: Once | INTRAMUSCULAR | Status: AC
  Administered 2016-07-23: 2 mg via INTRAVENOUS
  Filled 2016-07-23: qty 2

## 2016-07-23 MED ORDER — METOPROLOL TARTRATE 25 MG/10 ML ORAL SUSPENSION
25.0000 mg | Freq: Two times a day (BID) | ORAL | Status: DC
  Administered 2016-07-23 – 2016-07-26 (×7): 25 mg
  Filled 2016-07-23 (×7): qty 10

## 2016-07-23 MED ORDER — FENTANYL CITRATE (PF) 100 MCG/2ML IJ SOLN
INTRAMUSCULAR | Status: AC
  Administered 2016-07-23: 50 ug via INTRAVENOUS
  Filled 2016-07-23: qty 2

## 2016-07-23 MED ORDER — LIDOCAINE HCL 1 % IJ SOLN
INTRAMUSCULAR | Status: AC
Start: 2016-07-23 — End: 2016-07-23
  Filled 2016-07-23: qty 20

## 2016-07-23 MED ORDER — LIDOCAINE HCL 1 % IJ SOLN
INTRAMUSCULAR | Status: DC | PRN
  Administered 2016-07-23: 10 mL

## 2016-07-23 MED ORDER — POTASSIUM CHLORIDE 20 MEQ/15ML (10%) PO SOLN
20.0000 meq | ORAL | Status: AC
  Administered 2016-07-23 (×2): 20 meq
  Filled 2016-07-23 (×2): qty 15

## 2016-07-23 MED ORDER — WARFARIN SODIUM 7.5 MG PO TABS
12.5000 mg | ORAL_TABLET | Freq: Once | ORAL | Status: AC
  Administered 2016-07-23: 12.5 mg via ORAL
  Filled 2016-07-23: qty 1

## 2016-07-23 NOTE — Progress Notes (Signed)
Pt suddenly agitated reaching and at tube and attempting to get off table, meds as ordered

## 2016-07-23 NOTE — Progress Notes (Deleted)
Lactic acid called from lab 4.3, trending down

## 2016-07-23 NOTE — Progress Notes (Signed)
ANTICOAGULATION CONSULT NOTE - f/u Consult  Pharmacy Consult for Warfarin/Heparin Indication: New onset afib, prior DVT  Allergies  Allergen Reactions  . Hydromorphone     Respiratory depression    Patient Measurements: Height: 5\' 10"  (177.8 cm) Weight: 242 lb 4.6 oz (109.9 kg) IBW/kg (Calculated) : 73 Heparin Dosing Weight: 98.6  Vital Signs: Temp: 97.8 F (36.6 C) (08/14 0329) Temp Source: Axillary (08/14 0329) BP: 125/80 (08/14 0600) Pulse Rate: 83 (08/14 0600)  Labs:  Recent Labs  07/21/16 0305  07/21/16 1827 07/22/16 0508 07/23/16 0528 07/23/16 0529  HGB 11.7*  --   --  12.0*  --  12.7*  HCT 38.3*  --   --  38.6*  --  41.5  PLT 250  --   --  300  --  288  APTT  --   --   --   --  106*  --   LABPROT 17.0*  --   --  18.7* 19.7*  --   INR 1.37  --   --  1.55 1.65  --   HEPARINUNFRC 0.16*  < > 0.57 0.60 0.75*  --   CREATININE 0.88  --   --  0.83  --  0.82  CKTOTAL  --   --   --  153  --   --   CKMB  --   --   --  0.7  --   --   < > = values in this interval not displayed.  Estimated Creatinine Clearance: 89.2 mL/min (by C-G formula based on SCr of 0.82 mg/dL).   Assessment:  82 yo male with Hx DVTs on chronic coumadin PTA.  Also with new onset Afib with RVR. Pharmacy is consulted to dose heparin and warfarin bridge. Heparin level now supratherapeutic on 1950 units/hr. INR subtherapeutic at 1.65 but trending up nicely. Pt on ciprofloxacin which can potentiate warfarins effects  **PTA warfarin dose: 8 mg Mon/Thurs, 9 mg AOD  Goal of Therapy:  INR 2-3 Monitor platelets by anticoagulation protocol: Yes   Plan:  Decrease heparin to 1750 units/hr  Will f/u 8 hr heparin level Warfarin 12.5 mg PO x 1 again tonight Daily INR, HL, CBC, s/sx of bleeding   Christoper Fabianaron Deyani Hegarty, PharmD, BCPS Clinical pharmacist, pager [phone] 07/23/2016 6:23 AM

## 2016-07-23 NOTE — Progress Notes (Signed)
PULMONARY / CRITICAL CARE MEDICINE   Name: Allen Hamilton MRN: [id number] DOB: 24-Sep-1933    ADMISSION DATE:  07/12/2016 CONSULTATION DATE:  07/12/16  [facility]  CHIEF COMPLAINT:  C1 fracture s/p fall  BRIEF: [age] y/o male with HTN, HLD and DVT on coumadin here with a C1 fracture post fall, intubated, neurologically intact, has RLL aspiration pneumonia and mucus plugging.   SUBJECTIVE:  Remains critically ill , in  AF-RVR, on amio gtt Afebrile Good UO Denies pain   VITAL SIGNS: BP 133/84   Pulse 91   Temp 97.2 F (36.2 C) (Axillary)   Resp 16   Ht 5\' 10"  (1.778 m)   Wt 109 kg (240 lb 4.8 oz)   SpO2 98%   BMI 34.48 kg/m   HEMODYNAMICS:    VENTILATOR SETTINGS: Vent Mode: PRVC FiO2 (%):  [50 %] 50 % Set Rate:  [14 bmp] 14 bmp Vt Set:  [510 mL] 510 mL PEEP:  [5 cmH20] 5 cmH20 Plateau Pressure:  [17 cmH20-26 cmH20] 26 cmH20  INTAKE / OUTPUT: I/O last 3 completed shifts: In: 2887 [I.V.:1397; NG/GT:1490] Out: 2520 [Urine:2320; Stool:200]   PHYSICAL EXAMINATION: General: awake on vent, acutely ill HENT: NCAT hard collar ,  bovona  PULM:decreased bs bases  CV: RRR, no mgr GI: BS+, soft, nontender Neuro: RASS 0, follows commands, power 4/5 all 4Es Derm: no rash or skin breakdown  LABS:  BMET  Recent Labs Lab 07/21/16 0305 07/22/16 0508 07/23/16 0529  NA 144 144 148*  K 3.5 3.4* 3.3*  CL 100* 101 104  CO2 34* 36* 38*  BUN 49* 48* 48*  CREATININE 0.88 0.83 0.82  GLUCOSE 126* 118* 130*    Electrolytes  Recent Labs Lab 07/21/16 0305 07/22/16 0508 07/23/16 0529  CALCIUM 8.7* 8.7* 9.0  MG  --  2.4 2.2  PHOS  --  2.8 3.0    CBC  Recent Labs Lab 07/21/16 0305 07/22/16 0508 07/23/16 0529  WBC 13.9* 15.0* 14.5*  HGB 11.7* 12.0* 12.7*  HCT 38.3* 38.6* 41.5  PLT 250 300 288    Coag's  Recent Labs Lab 07/21/16 0305 07/22/16 0508 07/23/16 0528  APTT  --   --  106*  INR 1.37 1.55 1.65    Sepsis Markers  Recent  Labs Lab 07/22/16 0508 07/22/16 1355 07/23/16 0529  LATICACIDVEN 0.9  --   --   PROCALCITON  --  0.12 0.13    ABG  Recent Labs Lab 07/17/16 1034 07/18/16 0340 07/19/16 0510  PHART 7.384 7.455* 7.495*  PCO2ART 59.7* 51.2* 45.9*  PO2ART 77.0* 74.1* 59.1*    Liver Enzymes No results for input(s): AST, ALT, ALKPHOS, BILITOT, ALBUMIN in the last 168 hours.  Cardiac Enzymes  Recent Labs Lab 07/19/16 0929 07/19/16 1457  TROPONINI <0.03 <0.03    Glucose  Recent Labs Lab 07/22/16 1207 07/22/16 1539 07/22/16 1930 07/22/16 2330 07/23/16 0328 07/23/16 0742  GLUCAP 121* 116* 111* 115* 110* 123*    Imaging Dg Chest Port 1 View  Result Date: 07/23/2016 CLINICAL DATA:  82 year old male with respiratory failure. Tracheostomy. Initial encounter. EXAM: PORTABLE CHEST 1 VIEW COMPARISON:  07/22/2016 and earlier. FINDINGS: Portable AP semi upright view at 0555 hours. The patient is rotated toward the left. Allowing for this, the tracheostomy tube appears stable. Enteric feeding tube courses to the abdomen, tip not included. Continued low lung volumes with bilateral pleural effusions. Associated patchy and confluent bibasilar opacity is stable. No pneumothorax. Pulmonary vascularity remain stable.  Overall ventilation has not significantly changed since 07/18/2016. Stable cardiac size and mediastinal contours. IMPRESSION: 1. Stable tracheostomy and visible enteric feeding tube. 2. Stable ventilation. Bilateral pleural effusions with bibasilar collapse or consolidation. Electronically Signed   By: Odessa FlemingH  Hall M.D.   On: 07/23/2016 07:09     STUDIES:  CT head 8/2 > displaced fx of C1 along with fx of C6  CULTURES: Sputum 8/3 > few GNR, nl flora Gonorrhea 8/3 >neg Chlamydia 8/3 >neg 8/8 sputum>> nml flora 8/10 c dif>>neg 8/13 sputum >> GNR >>  ANTIBIOTICS: Unasyn 8/3 > 8/10  SIGNIFICANT EVENTS: 8/2 > [facility] and C6 fx, intubated for resp insufficiency 8/3 >  [facility] 8[facility]11 new onset AF-RVR  LINES/TUBES: ETT 8/3 > 8/8 Re-ETT 8/8 >> tstomy 8/10 >>  DISCUSSION: 82 y.o. M who suffered mechanical fall 8/1 and sustained C1 and C6 fx's.  He required intubation due to concern for airway compromise 8/3 and [facility]. Failed extubation 8/8 & ENT Trach 8/10  ASSESSMENT / PLAN:  NEUROLOGIC A:   Acute encephalopathy > improving Displaced C1 and C6 fx's - s/p mechanical fall Seen by neurosurgery, no role for surgery.   P:   Sedation: fentanyl PRN RASS goal: 0  Continue C collar x 6 weeks total  PULMONARY A: Acute respiratory failure with hypoxemia  Aspiration pneumonia RLL Collapse/atelectasis RLL Atelectasis, collapse resolved rt / asp 8/8 failed extubation >> tstomy P:   SBTs with transition to ATC  As tolerated Pulmonary toilet    CARDIOVASCULAR A:  Hx HTN, HLD, DVT on coumadin 8/10 New onset AF-RVR P:  Amio gtt Lopressor PO &  Prn Continue outpatient atorvastatin Hold outpatient lisinopril, niacin Resumed  heparin drip -can transition to lovenox   RENAL Lab Results  Component Value Date   CREATININE 0.82 07/23/2016   CREATININE 0.83 07/22/2016   CREATININE 0.88 07/21/2016    Recent Labs Lab 07/21/16 0305 07/22/16 0508 07/23/16 0529  K 3.5 3.4* 3.3*      A:   AKI - resolved,  Hypokalemia Hypernatremia P:   Monitor BMET and UOP Replace electrolytes as needed Add free water    GASTROINTESTINAL A:   GI prophylaxis Nutrition P:   SUP: Pantoprazole ct  TF  HEMATOLOGIC  Recent Labs  07/22/16 0508 07/23/16 0529  HGB 12.0* 12.7*   Lab Results  Component Value Date   INR 1.65 07/23/2016   INR 1.55 07/22/2016   INR 1.37 07/21/2016     A:   On chronic coumadin for chronic DVT  P:  NSGY Ok with restart anticoagulation, restarted 8/6 Follow CBC For IR PICC line am of 8/14  INFECTIOUS A:   Aspiration pneumonia RLL   Penile discharge Loose stools , doubt c diff more likely abx or nutrition related, c dif neg P:   Off unasyn On cipro, await GNR in sputum  ENDOCRINE CBG (last 3)   Recent Labs  07/22/16 2330 07/23/16 0328 07/23/16 0742  GLUCAP 115* 110* 123*     A:   At risk hyperglycemia   P:   Continue ssi  Family updated:  8/10, family hopeful [place], will need admitting MD in ICU there  Interdisciplinary Family Meeting v Palliative Care Meeting:  NA  My cc time x [phone]  Cyril Mourningakesh Remell Giaimo MD. [facility]P.  Pulmonary & Critical care Pager [phone] If no response call [phone]  07/23/2016, 8:48 AM

## 2016-07-23 NOTE — Progress Notes (Signed)
Rec'd from 2S with RT pt on vent, assist to procedure table, offer comfort will continue to monitor VS for PICC insertion

## 2016-07-23 NOTE — Care Management Important Message (Signed)
Important Message  Patient Details  Name: Allen Hamilton MRN: [id number] Date of Birth: 1933-06-13   Medicare Important Message Given:  Yes    Kyla BalzarineShealy, Demareon Coldwell Abena 07/23/2016, 10:42 AM

## 2016-07-23 NOTE — Progress Notes (Signed)
ANTICOAGULATION CONSULT NOTE - f/u Consult  Pharmacy Consult for Warfarin/Heparin Indication: New onset afib, prior DVT  Allergies  Allergen Reactions  . Hydromorphone     Respiratory depression    Patient Measurements: Height: 5\' 10"  (177.8 cm) Weight: 240 lb 4.8 oz (109 kg) IBW/kg (Calculated) : 73 Heparin Dosing Weight: 98.6  Vital Signs: Temp: 97.2 F (36.2 C) (08/14 0745) Temp Source: Axillary (08/14 0745) BP: 115/70 (08/14 1000) Pulse Rate: 102 (08/14 1000)  Labs:  Recent Labs  07/21/16 0305  07/21/16 1827 07/22/16 0508 07/23/16 0528 07/23/16 0529  HGB 11.7*  --   --  12.0*  --  12.7*  HCT 38.3*  --   --  38.6*  --  41.5  PLT 250  --   --  300  --  288  APTT  --   --   --   --  106*  --   LABPROT 17.0*  --   --  18.7* 19.7*  --   INR 1.37  --   --  1.55 1.65  --   HEPARINUNFRC 0.16*  < > 0.57 0.60 0.75*  --   CREATININE 0.88  --   --  0.83  --  0.82  CKTOTAL  --   --   --  153  --   --   CKMB  --   --   --  0.7  --   --   < > = values in this interval not displayed.  Estimated Creatinine Clearance: 88.8 mL/min (by C-G formula based on SCr of 0.82 mg/dL).   Assessment:  82 yo male with Hx DVTs on chronic coumadin PTA.  Also with new onset Afib with RVR. Pharmacy is consulted to dose heparin and warfarin bridge. Heparin level now therapeutic x1 on 1750 units/hr. INR subtherapeutic at 1.65 but trending up nicely. Pt on ciprofloxacin which can potentiate warfarins effects. Hg low stable, plt wnl, no bleed issues documented.  **PTA warfarin dose: 8 mg Mon/Thurs, 9 mg AOD  Goal of Therapy:  INR 2-3 Monitor platelets by anticoagulation protocol: Yes   Plan:  - Heparin at 1750 units/h - may transition to lovenox tomorrow per Vassie LollAlva - Will give warfarin 12.5 mg again tonight  - 8h HL to confirm, daily INR, CBC, HL, s/sx of bleeding  Babs BertinHaley Jawad Wiacek, PharmD, [facility]PS Clinical Pharmacist Pager [phone] 07/23/2016 10:17 AM

## 2016-07-23 NOTE — Progress Notes (Signed)
Patient's son wants [facility] to be [facility] [place], Dr. Delton CoombesByrum made aware

## 2016-07-23 NOTE — Procedures (Signed)
S/p RUE DL POWER PICC TIP SVC/RA NO COMP STABLE READY FOR USE

## 2016-07-23 NOTE — Progress Notes (Signed)
[facility] LPELINK ADULT ICU REPLACEMENT PROTOCOL FOR AM LAB REPLACEMENT ONLY  The patient does apply for the [facility] Electrolyte Replacment Protocol based on the criteria listed below:   1. Is GFR >/= 40 ml/min? Yes.    Patient's GFR today is >60 2. Is urine output >/= 0.5 ml/kg/hr for the last 6 hours? Yes.   Patient's UOP is 00.5 ml/kg/hr 3. Is BUN < 60 mg/dL? Yes.    Patient's BUN today is 48 4. Abnormal electrolyte(s):K 3.3 5. Ordered repletion with: per protocol 6. If a panic level lab has been reported, has the CCM MD in charge been notified? No..   Physician:    Allen Hamilton, Akaysha Cobern A 07/23/2016 6:38 AM

## 2016-07-23 NOTE — Progress Notes (Signed)
I spoke to Dr. Tedra Senegalhakkar -admitting hospitalist [facility] [place]. He will discussed with intensivist and get back to me. [facility] and son, Reuel BoomDaniel feels that this may be too far away. Other option may be to wean him to trach collar or [facility]  ALVA,RAKESH V. MD

## 2016-07-23 NOTE — Progress Notes (Signed)
eLink Physician-Brief Progress Note Patient Name: Allen Hamilton DOB: [birth year] MRN: [id number]   Date of Service  07/23/2016  HPI/Events of Note  Nurse requests sedation. Video assessment reveals patient to be comfortable and calm.   eICU Interventions  Will continue current management.      Intervention Category Minor Interventions: Agitation / anxiety - evaluation and management  Sommer,Steven Eugene 07/23/2016, 4:04 AM

## 2016-07-23 NOTE — Progress Notes (Signed)
Pt. Was transported to IR & back to 2S03 without any complications.

## 2016-07-23 NOTE — Progress Notes (Signed)
Received back from xray, pt combative and kicking, hitting at staff. Dr. Delton CoombesByrum here. pateint restrained x 4. Family updated.

## 2016-07-23 NOTE — Progress Notes (Signed)
To IR via bed, vent and monitor

## 2016-07-24 ENCOUNTER — Inpatient Hospital Stay (HOSPITAL_COMMUNITY): Payer: Medicare Other

## 2016-07-24 LAB — GLUCOSE, CAPILLARY
GLUCOSE-CAPILLARY: 124 mg/dL — AB (ref 65–99)
GLUCOSE-CAPILLARY: 119 mg/dL — AB (ref 65–99)
Glucose-Capillary: 115 mg/dL — ABNORMAL HIGH (ref 65–99)
Glucose-Capillary: 118 mg/dL — ABNORMAL HIGH (ref 65–99)
Glucose-Capillary: 123 mg/dL — ABNORMAL HIGH (ref 65–99)
Glucose-Capillary: 94 mg/dL (ref 65–99)

## 2016-07-24 LAB — CULTURE, BLOOD (ROUTINE X 2)
Culture: NO GROWTH
Culture: NO GROWTH

## 2016-07-24 LAB — PROCALCITONIN: Procalcitonin: 0.11 ng/mL

## 2016-07-24 LAB — CBC
HCT: 41.3 % (ref 39.0–52.0)
Hemoglobin: 12.6 g/dL — ABNORMAL LOW (ref 13.0–17.0)
MCH: 30 pg (ref 26.0–34.0)
MCHC: 30.5 g/dL (ref 30.0–36.0)
MCV: 98.3 fL (ref 78.0–100.0)
PLATELETS: 262 10*3/uL (ref 150–400)
RBC: 4.2 MIL/uL — ABNORMAL LOW (ref 4.22–5.81)
RDW: 14.5 % (ref 11.5–15.5)
WBC: 11.9 10*3/uL — ABNORMAL HIGH (ref 4.0–10.5)

## 2016-07-24 LAB — BASIC METABOLIC PANEL
Anion gap: 9 (ref 5–15)
BUN: 44 mg/dL — AB (ref 6–20)
CHLORIDE: 104 mmol/L (ref 101–111)
CO2: 34 mmol/L — ABNORMAL HIGH (ref 22–32)
Calcium: 8.9 mg/dL (ref 8.9–10.3)
Creatinine, Ser: 0.84 mg/dL (ref 0.61–1.24)
GFR calc Af Amer: >60 mL/min (ref >60)
GFR calc non Af Amer: >60 mL/min (ref >60)
GLUCOSE: 121 mg/dL — AB (ref 65–99)
POTASSIUM: 3.6 mmol/L (ref 3.5–5.1)
SODIUM: 147 mmol/L — AB (ref 135–145)

## 2016-07-24 LAB — PHOSPHORUS: Phosphorus: 4.1 mg/dL (ref 2.5–4.6)

## 2016-07-24 LAB — CULTURE, RESPIRATORY W GRAM STAIN

## 2016-07-24 LAB — CULTURE, RESPIRATORY

## 2016-07-24 LAB — PROTIME-INR
INR: 1.69
PROTHROMBIN TIME: 20.1 s — AB (ref 11.4–15.2)

## 2016-07-24 LAB — MAGNESIUM: Magnesium: 2.2 mg/dL (ref 1.7–2.4)

## 2016-07-24 LAB — HEPARIN LEVEL (UNFRACTIONATED): HEPARIN UNFRACTIONATED: 0.45 [IU]/mL (ref 0.30–0.70)

## 2016-07-24 MED ORDER — ENOXAPARIN SODIUM 120 MG/0.8ML ~~LOC~~ SOLN
110.0000 mg | Freq: Two times a day (BID) | SUBCUTANEOUS | Status: DC
  Filled 2016-07-24: qty 0.73

## 2016-07-24 MED ORDER — ENOXAPARIN SODIUM 120 MG/0.8ML ~~LOC~~ SOLN
110.0000 mg | Freq: Two times a day (BID) | SUBCUTANEOUS | Status: DC
  Administered 2016-07-24 (×2): 110 mg via SUBCUTANEOUS
  Filled 2016-07-24 (×3): qty 0.73

## 2016-07-24 MED ORDER — WARFARIN SODIUM 7.5 MG PO TABS
12.5000 mg | ORAL_TABLET | Freq: Once | ORAL | Status: AC
  Administered 2016-07-24: 12.5 mg via ORAL
  Filled 2016-07-24: qty 1

## 2016-07-24 MED ORDER — VITAL AF 1.2 CAL PO LIQD
1000.0000 mL | ORAL | Status: DC
  Administered 2016-07-25 – 2016-07-26 (×2): 1000 mL

## 2016-07-24 NOTE — Progress Notes (Addendum)
ANTICOAGULATION CONSULT NOTE - f/u Consult  Pharmacy Consult for Heparin Indication: New onset afib, prior DVT  Allergies  Allergen Reactions  . Hydromorphone     Respiratory depression    Patient Measurements: Height: 5\' 10"  (177.8 cm) Weight: 240 lb 4.8 oz (109 kg) IBW/kg (Calculated) : 73 Heparin Dosing Weight: 98.6  Vital Signs: Temp: 97.7 F (36.5 C) (08/14 2327) Temp Source: Axillary (08/14 2327) BP: 108/59 (08/14 2300) Pulse Rate: 53 (08/14 2300)  Labs:  Recent Labs  07/21/16 0305  07/22/16 0508 07/23/16 0528 07/23/16 0529 07/23/16 1423 07/23/16 2241  HGB 11.7*  --  12.0*  --  12.7*  --   --   HCT 38.3*  --  38.6*  --  41.5  --   --   PLT 250  --  300  --  288  --   --   APTT  --   --   --  106*  --   --   --   LABPROT 17.0*  --  18.7* 19.7*  --   --   --   INR 1.37  --  1.55 1.65  --   --   --   HEPARINUNFRC 0.16*  < > 0.60 0.75*  --  0.56 0.69  CREATININE 0.88  --  0.83  --  0.82  --   --   CKTOTAL  --   --  153  --   --   --   --   CKMB  --   --  0.7  --   --   --   --   < > = values in this interval not displayed.  Estimated Creatinine Clearance: 88.8 mL/min (by C-G formula based on SCr of 0.82 mg/dL).  Assessment:  82 yo male with hx DVTs on chronic coumadin PTA.  Also with new onset Afib with RVR. Pharmacy is consulted to dose heparin and warfarin bridge. Heparin level remains therapeutic but at upper end of range on 1750 units/hr.  Goal of Therapy:  INR 2-3; heparin level 0.3-0.7 units/ml Monitor platelets by anticoagulation protocol: Yes   Plan:  - Decrease heparin slightly to 1700 units/hr to keep in therapeutic range  - F/u daily heparin level  Christoper Fabianaron Madysyn Hanken, PharmD, BCPS Clinical pharmacist, pager [phone] 07/24/2016 12:11 AM

## 2016-07-24 NOTE — Progress Notes (Signed)
SLP Cancellation Note  Patient Details Name: Allen Hamilton G Liss MRN: [id number] DOB: 16-Sep-1933   Cancelled treatment:       Reason Eval/Treat Not Completed: Patient not medically ready. Per RN, pt tired an going back on vent. Will reattempt tomorrow.    Rocky CraftsKara E Torsten Weniger MA, CCC-SLP Pager [phone] 07/24/2016, 4:25 PM

## 2016-07-24 NOTE — Progress Notes (Signed)
ANTICOAGULATION CONSULT NOTE - f/u Consult  Pharmacy Consult for Heparin>lovenox Indication: New onset afib, prior DVT  Allergies  Allergen Reactions  . Hydromorphone     Respiratory depression    Patient Measurements: Height: 5\' 10"  (177.8 cm) Weight: 242 lb 8.1 oz (110 kg) IBW/kg (Calculated) : 73 Heparin Dosing Weight: 98.6  Vital Signs: Temp: 97.1 F (36.2 C) (08/15 0800) Temp Source: Oral (08/15 0800) BP: 99/73 (08/15 0900) Pulse Rate: 64 (08/15 0900)  Labs:  Recent Labs  07/22/16 0508 07/23/16 0528 07/23/16 0529 07/23/16 1423 07/23/16 2241 07/24/16 0302  HGB 12.0*  --  12.7*  --   --  12.6*  HCT 38.6*  --  41.5  --   --  41.3  PLT 300  --  288  --   --  262  APTT  --  106*  --   --   --   --   LABPROT 18.7* 19.7*  --   --   --  20.1*  INR 1.55 1.65  --   --   --  1.69  HEPARINUNFRC 0.60 0.75*  --  0.56 0.69 0.45  CREATININE 0.83  --  0.82  --   --  0.84  CKTOTAL 153  --   --   --   --   --   CKMB 0.7  --   --   --   --   --     Estimated Creatinine Clearance: 87.1 mL/min (by C-G formula based on SCr of 0.84 mg/dL).  Assessment:  82 yo male with hx DVTs on chronic coumadin PTA.  Also with new onset Afib. Pharmacy is consulted to transition heparin to lovenox 8/15 + warfarin. INR 1.69 stable after several increased doses. Hg low stable, plt wnl, no bleed documented. Noted patient on cipro and amio IV - may affect INR.  **PTA warfarin dose: 8 mg Mon/Thurs, 9 mg all other days  Goal of Therapy:  INR 2-3  Monitor platelets by anticoagulation protocol: Yes   Plan:  - D/c heparin IV. Start lovenox 110mg  Wallace q12h 1 hour after hep gtt d/c'd - communicated with RN - Warfarin 12.5 mg again tonight  - Daily INR, CBC at least q72h - Monitor s/sx of bleeding   Babs BertinHaley Kristyna Bradstreet, PharmD, BCPS Clinical Pharmacist 07/24/2016 9:31 AM

## 2016-07-24 NOTE — Evaluation (Signed)
Physical Therapy Evaluation Patient Details Name: Allen Hamilton MRN: [id number] DOB: 29-Jun-1933 Today's Date: 07/24/2016   History of Present Illness  [facility] and suffering a C1 fx. Pt developed resp failure and was intubated and [facility]. Extubated on 8/8 and emergently reintubated. Trached on 8/10. During eval pt on trach collar. Pt in cervical collar for C1 fx. No surgery needed.  PMH - HTN, DVT  Clinical Impression  Pt admitted with above diagnosis and presents to PT with functional limitations due to deficits listed below (See PT problem list). Pt needs skilled PT to maximize independence and safety [facility]. Pt with good strength and expect he will make good progress if he remains medically stable.    Follow Up Recommendations LTACH    Equipment Recommendations  Other (comment) (To be assessed)    Recommendations for Other Services OT consult     Precautions / Restrictions Precautions Precautions: Fall;Cervical Required Braces or Orthoses: Cervical Brace Cervical Brace: Hard collar;At all times Restrictions Weight Bearing Restrictions: No      Mobility  Bed Mobility                  Transfers                    Ambulation/Gait                Stairs            Wheelchair Mobility    Modified Rankin (Stroke Patients Only)       Balance                                             Pertinent Vitals/Pain Pain Assessment: Faces Faces Pain Scale: No hurt    Home Living Family/patient expects to be discharged to:: Other (Comment) (LTACH)                      Prior Function Level of Independence: Independent         Comments: 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 [place] and family working to have him moved to facility closer to home. Wife has dementia. 3 children taking turns staying here and taking care  of wife. Childrent weren't aware of extent of dementia in wife until now. Husband has been caring for her.     Hand Dominance   Dominant Hand: Right    Extremity/Trunk Assessment   Upper Extremity Assessment: Generalized weakness (Strength at least 4/5 except decr fine motor in hands)           Lower Extremity Assessment: Generalized weakness      Cervical / Trunk Assessment: Other exceptions (Neck in cervical collar)  Communication   Communication: Tracheostomy  Cognition Arousal/Alertness: Awake/alert Behavior During Therapy: Restless Overall Cognitive Status: Difficult to assess                      General Comments      Exercises General Exercises - Upper Extremity Shoulder Flexion: AROM;Both;10 reps;Supine Shoulder Horizontal ABduction: AROM;10 reps;Both;Supine Shoulder Horizontal ADduction: AROM;Both;10 reps;Supine Elbow Flexion: AROM;Both;10 reps;Supine Elbow Extension: AROM;Both;10 reps;Supine General Exercises - Lower Extremity Ankle Circles/Pumps: AROM;Both;10 reps;Supine Heel Slides: Strengthening;Both;10 reps;Supine Hip ABduction/ADduction: AROM;Both;10 reps;Supine  Assessment/Plan    PT Assessment Patient needs continued PT services  PT Diagnosis Difficulty walking;Generalized weakness   PT Problem List Decreased strength;Decreased activity tolerance;Decreased balance;Decreased mobility  PT Treatment Interventions DME instruction;Gait training;Functional mobility training;Therapeutic activities;Therapeutic exercise;Balance training;Neuromuscular re-education;Patient/family education   PT Goals (Current goals can be found in the Care Plan section) Acute Rehab PT Goals Patient Stated Goal: Pt unable to state due to trach. Family wants him transferred to facility closer to home PT Goal Formulation: With patient/family Time For Goal Achievement: 08/07/16 Potential to Achieve Goals: Good    Frequency Min 3X/week   Barriers to discharge         Co-evaluation               End of Session Equipment Utilized During Treatment: Cervical collar;Oxygen Activity Tolerance: Patient tolerated treatment well Patient left: in bed;with call bell/phone within reach;with family/visitor present;with restraints reapplied (mitts) Nurse Communication: Other (comment) (tolerance of treatment)         Time: [id number] PT Time Calculation (min) (ACUTE ONLY): 31 min   Charges:   PT Evaluation $PT Eval Moderate Complexity: 1 Procedure PT Treatments $Therapeutic Exercise: 8-22 mins   PT G Codes:        Allen Hamilton 07/24/2016, 12:02 PM [employer] Jonnatan Hamilton PT [phone]

## 2016-07-24 NOTE — Progress Notes (Addendum)
Nutrition Follow Up  DOCUMENTATION CODES:   Obesity unspecified  INTERVENTION:    D/C Vital High Protein formula   D/C Prostat liquid protein   Initiate Vital AF 1.2 formula at goal rate of 75 ml/h (1800 ml per day) to provide 2160 kcals, 135 gm protein, 1459 ml free water daily  NUTRITION DIAGNOSIS:   Inadequate oral intake related to inability to eat as evidenced by NPO status, ongoing  NEW GOAL:   Patient will meet greater than or equal to 90% of their needs, progressing  MONITOR:   TF tolerance, Labs, Weight trends, Skin, I & O's  ASSESSMENT:   82 y.o. M who suffered mechanical fall 8/1 and sustained C1 and C6 fx's.  He required intubation due to concern for airway compromise 8/3 and was later [facility] and [facility].   Patient s/p procedure 8/10: TRACHEOSTOMY  Propofol discontinued.  Currently on trach collar >> in cervical collar for C1 fx. Vital High Protein formula currently infusing at 30 ml/hr via CORTRAK small bore feeding tube (tip in duodenum). Also receiving Prostat liquid protein 60 ml TID. Free water flushes at 200 ml every 4 hours. Now that pt off ventilator support, will adjust TF regimen.  Diet Order:  Diet NPO time specified  Skin:  Reviewed, no issues  Last BM:  8/14  Height:   Ht Readings from Last 1 Encounters:  07/12/16 5\' 10"  (1.778 m)    Weight >>> stable  Wt Readings from Last 1 Encounters:  07/24/16 242 lb 8.1 oz (110 kg)    Ideal Body Weight:  75.5 kg  BMI:  Body mass index is 34.8 kg/m.  Estimated Nutritional Needs:   Kcal:  2100-2300  Protein:  120-130 gm  Fluid:  per MD  EDUCATION NEEDS:   No education needs identified at this time  Maureen ChattersKatie Orman Matsumura, RD, LDN Pager #: [phone] After-Hours Pager #: [phone]

## 2016-07-24 NOTE — Progress Notes (Signed)
PULMONARY / CRITICAL CARE MEDICINE   Name: Allen Hamilton MRN: [id number] DOB: Jun 21, 1933    ADMISSION DATE:  07/12/2016 CONSULTATION DATE:  07/12/16  REFERRING MD:  Santa Claus:  C1 fracture s/p fall  BRIEF: 82 y/o male with HTN, HLD and DVT on coumadin here with a C1 fracture post fall, intubated, neurologically intact, has RLL aspiration pneumonia and mucus plugging. He lives in [place] and on 8/1, was checking on some houses that he was apparently having built and unfortunately suffered a mechanical fall after tripping over a hole causing him to strike his head. On 8/2, he went to [facility] where CT scan revealed a displaced C1 fx involving both arches.  He was placed in a brace and was given pain meds for pain management. After receiving pain meds, he had desaturations and developed respiratory insufficiency.  He was started on BiPAP but later complained of difficulty swallowing.  Due to concern for airway compromise, he was subsequently intubated.  He was then transferred to [facility] ICU on 8/3 for further evaluation and management.  SUBJECTIVE:  Remains critically ill , on precedex gtt Remains in  AF-rate controlled  on amio gtt Afebrile Good UO Denies pain   VITAL SIGNS: BP (!) 99/53 (BP Location: Left Arm)   Pulse 83   Temp 97.9 F (36.6 C) (Axillary)   Resp 14   Ht 5' 10"  (1.778 m)   Wt 110 kg (242 lb 8.1 oz)   SpO2 100%   BMI 34.80 kg/m   HEMODYNAMICS:    VENTILATOR SETTINGS: Vent Mode: PRVC FiO2 (%):  [50 %] 50 % Set Rate:  [14 bmp] 14 bmp Vt Set:  [500 mL-510 mL] 500 mL PEEP:  [5 cmH20] 5 cmH20 Plateau Pressure:  [16 cmH20-20 cmH20] 18 cmH20  INTAKE / OUTPUT: I/O last 3 completed shifts: In: 3818.7 [I.V.:1683.7; NG/GT:2135] Out: 2116 [Urine:1916; Stool:200]   PHYSICAL EXAMINATION: General: awake on vent, acutely ill HENT: NCAT hard collar ,  bovona  PULM:decreased bs bases  CV: RRR, no mgr GI: BS+, soft,  nontender Neuro: RASS 0, follows commands, power 4/5 all 4Es Derm: no rash or skin breakdown  LABS:  BMET  Recent Labs Lab 07/22/16 0508 07/23/16 0529 07/24/16 0302  NA 144 148* 147*  K 3.4* 3.3* 3.6  CL 101 104 104  CO2 36* 38* 34*  BUN 48* 48* 44*  CREATININE 0.83 0.82 0.84  GLUCOSE 118* 130* 121*    Electrolytes  Recent Labs Lab 07/22/16 0508 07/23/16 0529 07/24/16 0302  CALCIUM 8.7* 9.0 8.9  MG 2.4 2.2 2.2  PHOS 2.8 3.0 4.1    CBC  Recent Labs Lab 07/22/16 0508 07/23/16 0529 07/24/16 0302  WBC 15.0* 14.5* 11.9*  HGB 12.0* 12.7* 12.6*  HCT 38.6* 41.5 41.3  PLT 300 288 262    Coag's  Recent Labs Lab 07/22/16 0508 07/23/16 0528 07/24/16 0302  APTT  --  106*  --   INR 1.55 1.65 1.69    Sepsis Markers  Recent Labs Lab 07/22/16 0508 07/22/16 1355 07/23/16 0529 07/24/16 0302  LATICACIDVEN 0.9  --   --   --   PROCALCITON  --  0.12 0.13 0.11    ABG  Recent Labs Lab 07/17/16 1034 07/18/16 0340 07/19/16 0510  PHART 7.384 7.455* 7.495*  PCO2ART 59.7* 51.2* 45.9*  PO2ART 77.0* 74.1* 59.1*    Liver Enzymes No results for input(s): AST, ALT, ALKPHOS, BILITOT, ALBUMIN in the last 168 hours.  Cardiac Enzymes  Recent Labs Lab 07/19/16 0929 07/19/16 1457  TROPONINI <0.03 <0.03    Glucose  Recent Labs Lab 07/23/16 0742 07/23/16 1223 07/23/16 1546 07/23/16 2049 07/23/16 2330 07/24/16 0334  GLUCAP 123* 110* 153* 131* 150* 123*    Imaging Ir Fluoro Guide Cv Line Right  Result Date: 07/23/2016 INDICATION: RESPIRATORY FAILURE, HYPOXIA, CERVICAL SPINE FRACTURES, POOR PERIPHERAL ACCESS, TRACHEOSTOMY EXAM: ULTRASOUND AND FLUOROSCOPIC GUIDED PICC LINE INSERTION MEDICATIONS: None. CONTRAST:  None FLUOROSCOPY TIME:  Sixty seconds (12 mGy) COMPLICATIONS: None immediate. TECHNIQUE: The procedure, risks, benefits, and alternatives were explained to [place] and informed written consent was obtained. A timeout was performed  prior to the initiation of the procedure. The right upper extremity was prepped with chlorhexidine in a sterile fashion, and a sterile drape was applied covering the operative field. Maximum barrier sterile technique with sterile gowns and gloves were used for the procedure. A timeout was performed prior to the initiation of the procedure. Local anesthesia was provided with 1% lidocaine. Under direct ultrasound guidance, the right basilic vein was accessed with a micropuncture kit after the overlying soft tissues were anesthetized with 1% lidocaine. An ultrasound image was saved for documentation purposes. A guidewire was advanced to the level of the superior caval-atrial junction for measurement purposes and the PICC line was cut to length. A peel-away sheath was placed and a 45 cm, 5 [place], dual lumen was inserted to level of the superior caval-atrial junction. A post procedure spot fluoroscopic was obtained. The catheter easily aspirated and flushed and was sutured in place. A dressing was placed. The patient tolerated the procedure well without immediate post procedural complication. FINDINGS: After catheter placement, the tip lies within the superior cavoatrial junction. The catheter aspirates and flushes normally and is ready for immediate use. IMPRESSION: Successful ultrasound and fluoroscopic guided placement of a right basilic vein approach, 45 cm, 5 French, dual lumen PICC with tip at the superior caval-atrial junction. The PICC line is ready for immediate use. Electronically Signed   By: Jerilynn Mages.  Shick M.D.   On: 07/23/2016 11:39   Ir US Guide Vasc Access Right  Result Date: 07/23/2016 INDICATION: RESPIRATORY FAILURE, HYPOXIA, CERVICAL SPINE FRACTURES, POOR PERIPHERAL ACCESS, TRACHEOSTOMY EXAM: ULTRASOUND AND FLUOROSCOPIC GUIDED PICC LINE INSERTION MEDICATIONS: None. CONTRAST:  None FLUOROSCOPY TIME:  Sixty seconds (12 mGy) COMPLICATIONS: None immediate. TECHNIQUE: The procedure, risks, benefits, and  alternatives were explained to [place] and informed written consent was obtained. A timeout was performed prior to the initiation of the procedure. The right upper extremity was prepped with chlorhexidine in a sterile fashion, and a sterile drape was applied covering the operative field. Maximum barrier sterile technique with sterile gowns and gloves were used for the procedure. A timeout was performed prior to the initiation of the procedure. Local anesthesia was provided with 1% lidocaine. Under direct ultrasound guidance, the right basilic vein was accessed with a micropuncture kit after the overlying soft tissues were anesthetized with 1% lidocaine. An ultrasound image was saved for documentation purposes. A guidewire was advanced to the level of the superior caval-atrial junction for measurement purposes and the PICC line was cut to length. A peel-away sheath was placed and a 45 cm, 5 [place], dual lumen was inserted to level of the superior caval-atrial junction. A post procedure spot fluoroscopic was obtained. The catheter easily aspirated and flushed and was sutured in place. A dressing was placed. The patient tolerated the procedure well without immediate post procedural complication. FINDINGS: After catheter placement,  the tip lies within the superior cavoatrial junction. The catheter aspirates and flushes normally and is ready for immediate use. IMPRESSION: Successful ultrasound and fluoroscopic guided placement of a right basilic vein approach, 45 cm, 5 French, dual lumen PICC with tip at the superior caval-atrial junction. The PICC line is ready for immediate use. Electronically Signed   By: Jerilynn Mages.  Shick M.D.   On: 07/23/2016 11:39   Dg Chest Port 1 View  Result Date: 07/24/2016 CLINICAL DATA:  Tracheostomy, acute hypoxemic respiratory failure EXAM: PORTABLE CHEST 1 VIEW COMPARISON:  Portable exam 0545 hours compared 07/23/2016 FINDINGS: Tracheostomy tube again identified, tip projecting  3.9 cm above carina. RIGHT arm PICC line tip projects over cavoatrial junction. Enlargement of cardiac silhouette with vascular congestion. Rotated to the RIGHT. Bibasilar pleural effusions. Bibasilar atelectasis versus infiltrate greater on LEFT. No pneumothorax. IMPRESSION: Persistent bibasilar pleural effusions and atelectasis versus infiltrate. Electronically Signed   By: Lavonia Dana M.D.   On: 07/24/2016 07:22     STUDIES:  CT head 8/2 > displaced fx of C1 along with fx of C6  CULTURES: Sputum 8/3 > few GNR, nl flora Gonorrhea 8/3 >neg Chlamydia 8/3 >neg 8/8 sputum>> nml flora 8/10 c dif>>neg 8/13 sputum >> GNR >>  ANTIBIOTICS: Unasyn 8/3 > 8/10 cipro 8/13 >>  SIGNIFICANT EVENTS: 8/2 > admitted at [facility] with C1 and C6 fx, intubated for resp insufficiency 8/3 > transferred to [facility] ICU 8/11 new onset AF-RVR  LINES/TUBES: ETT 8/3 > 8/8 Re-ETT 8/8 >> tstomy 8/10 >> PICC 8/14 >>  DISCUSSION: 82 y.o. M who suffered mechanical fall 8/1 and sustained C1 and C6 fx's.  He required intubation due to concern for airway compromise 8/3 and was later transferred to [facility] for further evaluation and management. Failed extubation 8/8 & ENT Trach 8/10  ASSESSMENT / PLAN:  NEUROLOGIC A:   Acute encephalopathy > improving Displaced C1 and C6 fx's - s/p mechanical fall Seen by neurosurgery, no role for surgery.   P:   Sedation: fentanyl PRN Precedex gtt RASS goal: 0  Continue C collar x 6 weeks total  PULMONARY A: Acute respiratory failure with hypoxemia  Aspiration pneumonia RLL Collapse/atelectasis RLL Atelectasis, collapse resolved rt / asp 8/8 failed extubation >> tstomy P:   SBTs with goal ATC  As tolerated Pulmonary toilet    CARDIOVASCULAR A:  Hx HTN, HLD, DVT on coumadin 8/10 New onset AF-RVR P:  Amio gtt -transition to oral soon Lopressor 25 PO bid &  Prn Continue outpatient atorvastatin Hold outpatient lisinopril, niacin can transition from heparin to  lovenox   RENAL Lab Results  Component Value Date   CREATININE 0.84 07/24/2016   CREATININE 0.82 07/23/2016   CREATININE 0.83 07/22/2016    Recent Labs Lab 07/22/16 0508 07/23/16 0529 07/24/16 0302  K 3.4* 3.3* 3.6      A:   AKI - resolved,  Hypokalemia Hypernatremia P:   Monitor BMET and UOP Replace electrolytes as needed Added free water    GASTROINTESTINAL A:   GI prophylaxis Nutrition P:   SUP: Pantoprazole ct  TF  HEMATOLOGIC  Recent Labs  07/23/16 0529 07/24/16 0302  HGB 12.7* 12.6*   Lab Results  Component Value Date   INR 1.69 07/24/2016   INR 1.65 07/23/2016   INR 1.55 07/22/2016     A:   On chronic coumadin for chronic DVT  P:  start lovenox as above, to coumadin at some point Follow CBC   INFECTIOUS A:   Aspiration  pneumonia RLL  Penile discharge Loose stools , doubt c diff more likely abx or nutrition related, c dif neg P:   Off unasyn On cipro, await GNR in sputum  ENDOCRINE CBG (last 3)   Recent Labs  07/23/16 2049 07/23/16 2330 07/24/16 0334  GLUCAP 131* 150* 123*     A:   At risk hyperglycemia   P:   Continue ssi  Family updated:  8/14 , son Quillian Quince -family hopeful to transfer him to [place] for rehab eventually, 8/15  I spoke to Dr. Theodora Blow -admitting hospitalist & to intensivist - Dr Adair Patter at [facility] in [place]. [facility] [place] and son, Quillian Quince feels that this may be too far away. Other option may be to wean him to trach collar or transfer him to [place] v Palliative Care Meeting:  NA  My cc time x 44m RKara MeadMD. [facility] Pager [phone] no response call [phone]   07/24/2016, 8:36 AM

## 2016-07-25 LAB — GLUCOSE, CAPILLARY
GLUCOSE-CAPILLARY: 120 mg/dL — AB (ref 65–99)
GLUCOSE-CAPILLARY: 125 mg/dL — AB (ref 65–99)
GLUCOSE-CAPILLARY: 113 mg/dL — AB (ref 65–99)
Glucose-Capillary: 113 mg/dL — ABNORMAL HIGH (ref 65–99)
Glucose-Capillary: 121 mg/dL — ABNORMAL HIGH (ref 65–99)
Glucose-Capillary: 132 mg/dL — ABNORMAL HIGH (ref 65–99)

## 2016-07-25 LAB — MAGNESIUM: Magnesium: 1.9 mg/dL (ref 1.7–2.4)

## 2016-07-25 LAB — BASIC METABOLIC PANEL
Anion gap: 6 (ref 5–15)
BUN: 35 mg/dL — AB (ref 6–20)
CHLORIDE: 104 mmol/L (ref 101–111)
CO2: 34 mmol/L — AB (ref 22–32)
CREATININE: 0.67 mg/dL (ref 0.61–1.24)
Calcium: 8.3 mg/dL — ABNORMAL LOW (ref 8.9–10.3)
GFR calc Af Amer: >60 mL/min (ref >60)
GFR calc non Af Amer: >60 mL/min (ref >60)
Glucose, Bld: 201 mg/dL — ABNORMAL HIGH (ref 65–99)
Potassium: 3.1 mmol/L — ABNORMAL LOW (ref 3.5–5.1)
Sodium: 144 mmol/L (ref 135–145)

## 2016-07-25 LAB — PROTIME-INR
INR: 2.2
PROTHROMBIN TIME: 24.8 s — AB (ref 11.4–15.2)

## 2016-07-25 LAB — CBC
HEMATOCRIT: 39.8 % (ref 39.0–52.0)
HEMOGLOBIN: 12 g/dL — AB (ref 13.0–17.0)
MCH: 30 pg (ref 26.0–34.0)
MCHC: 30.2 g/dL (ref 30.0–36.0)
MCV: 99.5 fL (ref 78.0–100.0)
Platelets: 254 10*3/uL (ref 150–400)
RBC: 4 MIL/uL — AB (ref 4.22–5.81)
RDW: 14.3 % (ref 11.5–15.5)
WBC: 12.9 10*3/uL — AB (ref 4.0–10.5)

## 2016-07-25 LAB — PHOSPHORUS: Phosphorus: 3.4 mg/dL (ref 2.5–4.6)

## 2016-07-25 MED ORDER — WARFARIN SODIUM 3 MG PO TABS
9.0000 mg | ORAL_TABLET | Freq: Once | ORAL | Status: AC
  Administered 2016-07-25: 9 mg via ORAL
  Filled 2016-07-25: qty 3

## 2016-07-25 MED ORDER — AMIODARONE PEDIATRIC ORAL SUSPENSION 5 MG/ML
200.0000 mg | Freq: Every day | ORAL | Status: DC
  Filled 2016-07-25 (×4): qty 40

## 2016-07-25 MED ORDER — HALOPERIDOL LACTATE 5 MG/ML IJ SOLN
1.0000 mg | INTRAMUSCULAR | Status: DC | PRN
  Administered 2016-07-25 – 2016-07-26 (×2): 4 mg via INTRAVENOUS
  Filled 2016-07-25 (×3): qty 1

## 2016-07-25 MED ORDER — AMIODARONE HCL 200 MG PO TABS
200.0000 mg | ORAL_TABLET | Freq: Every day | ORAL | Status: DC
  Administered 2016-07-25 – 2016-07-26 (×2): 200 mg
  Filled 2016-07-25 (×2): qty 1

## 2016-07-25 MED ORDER — POTASSIUM CHLORIDE 20 MEQ/15ML (10%) PO SOLN
30.0000 meq | ORAL | Status: AC
  Administered 2016-07-25 (×2): 30 meq
  Filled 2016-07-25 (×2): qty 30

## 2016-07-25 NOTE — Progress Notes (Signed)
PULMONARY / CRITICAL CARE MEDICINE   Name: Nunzio Cobbsaul G Hackmann MRN: [id number] DOB: 1933/08/17    ADMISSION DATE:  07/12/2016 CONSULTATION DATE:  07/12/16  REF[facility]:  [facility]  CHIEF COMPLAINT:  C1 fracture s/p fall  BRIEF: 82 y/o male with HTN, HLD and DVT on coumadin here with a C1 fracture post fall, intubated, neurologically intact, has RLL aspiration pneumonia and mucus plugging. He lives in [place] and on 8/1, was checking on some houses that he was apparently having built and unfortunately suffered a mechanical fall after tripping over a hole causing him to strike his head. On 8/2, [facility] went to [facility] where CT scan revealed a displaced C1 fx involving both arches.  He was placed in a brace and was given pain meds for pain management. After receiving pain meds, he had desaturations and developed respiratory insufficiency.  He was started on BiPAP but later complained of difficulty swallowing.  Due to concern for airway compromise, he was subsequently intubated.  [facility] was then [facility] for further evaluation and management.  SUBJECTIVE:  On ATC since 8/15 am Remains critically ill , on low dose precedex gtt Remains in  AF-rate controlled  on amio gtt Afebrile Good UO Denies pain   VITAL SIGNS: BP 113/61 (BP Location: Left Arm)   Pulse 83   Temp 97.8 F (36.6 C) (Oral)   Resp (!) 27   Ht 5\' 10"  (1.778 m)   Wt 239 lb 10.2 oz (108.7 kg)   SpO2 95%   BMI 34.38 kg/m   HEMODYNAMICS:    VENTILATOR SETTINGS: FiO2 (%):  [35 %-40 %] 35 %  INTAKE / OUTPUT: I/O last 3 completed shifts: In: 4487.5 [I.V.:1476.3; Other:90; NG/GT:2921.3] Out: 2425 [Urine:2025; Stool:400]   PHYSICAL EXAMINATION: General: awake, more alert  HENT: NCAT hard collar ,  bovona  PULM:decreased bs bases  CV: RRR, no mgr GI: BS+, soft, nontender Neuro: RASS 0, follows commands, power 4/5 all 4Es Derm: no rash or skin breakdown  LABS:  BMET  Recent  Labs Lab 07/23/16 0529 07/24/16 0302 07/25/16 0350  NA 148* 147* 144  K 3.3* 3.6 3.1*  CL 104 104 104  CO2 38* 34* 34*  BUN 48* 44* 35*  CREATININE 0.82 0.84 0.67  GLUCOSE 130* 121* 201*    Electrolytes  Recent Labs Lab 07/23/16 0529 07/24/16 0302 07/25/16 0350  CALCIUM 9.0 8.9 8.3*  MG 2.2 2.2 1.9  PHOS 3.0 4.1 3.4    CBC  Recent Labs Lab 07/23/16 0529 07/24/16 0302 07/25/16 0350  WBC 14.5* 11.9* 12.9*  HGB 12.7* 12.6* 12.0*  HCT 41.5 41.3 39.8  PLT 288 262 254    Coag's  Recent Labs Lab 07/23/16 0528 07/24/16 0302 07/25/16 0350  APTT 106*  --   --   INR 1.65 1.69 2.20    Sepsis Markers  Recent Labs Lab 07/22/16 0508 07/22/16 1355 07/23/16 0529 07/24/16 0302  LATICACIDVEN 0.9  --   --   --   PROCALCITON  --  0.12 0.13 0.11    ABG  Recent Labs Lab 07/19/16 0510  PHART 7.495*  PCO2ART 45.9*  PO2ART 59.1*    Liver Enzymes No results for input(s): AST, ALT, ALKPHOS, BILITOT, ALBUMIN in the last 168 hours.  Cardiac Enzymes  Recent Labs Lab 07/19/16 0929 07/19/16 1457  TROPONINI <0.03 <0.03    Glucose  Recent Labs Lab 07/24/16 0854 07/24/16 1253 07/24/16 1707 07/24/16 1922 07/24/16 2337 07/25/16 0402  GLUCAP  115* 94 124* 119* 118* 121*    Imaging No results found.   STUDIES:  CT head 8/2 > displaced fx of C1 along with fx of C6  CULTURES: Sputum 8/3 > few GNR, nl flora Gonorrhea 8/3 >neg Chlamydia 8/3 >neg 8/8 sputum>> nml flora 8/10 c dif>>neg 8/13 sputum >>e coli pan S  ANTIBIOTICS: Unasyn 8/3 > 8/10 cipro 8/13 >>  SIGNIFICANT EVENTS: 8/2 > admitted at [facility] with C1 and C6 fx, intubated for resp insufficiency 8/3 > transferred to [facility] ICU 8/11 new onset AF-RVR  LINES/TUBES: ETT 8/3 > 8/8 Re-ETT 8/8 >> tstomy 8/10 >> PICC 8/14 >>  DISCUSSION: 82 y.o. M who suffered mechanical fall 8/1 and sustained C1 and C6 fx's.  He required intubation due to concern for airway compromise 8/3 and was later  transferred to [facility] for further evaluation and management. Failed extubation 8/8 & ENT Trach 8/10 On ATC since 8/15 am  ASSESSMENT / PLAN:  NEUROLOGIC A:   Acute encephalopathy > improving, favor ICU delerium Displaced C1 and C6 fx's - s/p mechanical fall Seen by neurosurgery, no role for surgery.   P:   Sedation: fentanyl PRN Precedex gtt -minimise & taper to off  RASS goal: 0  Continue C collar x 6 weeks total  PULMONARY A: Acute respiratory failure with hypoxemia  Aspiration pneumonia RLL Collapse/atelectasis RLL Atelectasis, collapse resolved rt / asp 8/8 failed extubation >> tstomy P:    ATC  As tolerated Pulmonary toilet    CARDIOVASCULAR A:  Hx HTN, HLD, DVT on coumadin 8/10 New onset AF-RVR P:  Amio gtt -transition to oral -anticipate 4-6 wks Lopressor 25 PO bid &  Prn Continue outpatient atorvastatin Hold outpatient lisinopril, niacin  transitioned from heparin tocoumadin   RENAL Lab Results  Component Value Date   CREATININE 0.67 07/25/2016   CREATININE 0.84 07/24/2016   CREATININE 0.82 07/23/2016    Recent Labs Lab 07/23/16 0529 07/24/16 0302 07/25/16 0350  K 3.3* 3.6 3.1*      A:   AKI - resolved,  Hypokalemia Hypernatremia P:   Monitor BMET and UOP Replace electrolytes as needed ct free water    GASTROINTESTINAL A:   GI prophylaxis Nutrition P:   SUP: Pantoprazole ct  TF Swallow eval soon  HEMATOLOGIC  Recent Labs  07/24/16 0302 07/25/16 0350  HGB 12.6* 12.0*   Lab Results  Component Value Date   INR 2.20 07/25/2016   INR 1.69 07/24/2016   INR 1.65 07/23/2016     A:   On chronic coumadin for chronic DVT  P:  Resume coumadin , dc lovenox now that INR therapeutic Follow CBC   INFECTIOUS A:   Aspiration pneumonia RLL  Penile discharge Loose stools , doubt c diff more likely abx or nutrition related, c dif neg P:   Off unasyn On cipro - plan 7 days  ENDOCRINE CBG (last 3)   Recent Labs   07/24/16 1922 07/24/16 2337 07/25/16 0402  GLUCAP 119* 118* 121*     A:   At risk hyperglycemia   P:   Continue ssi  Family updated:  8/14 , son Reuel Boom -family hopeful to transfer him to [place] for rehab eventually, 8/15  I spoke to Dr. Tedra Senegal -admitting hospitalist & to intensivist - Dr Arnette Norris at [facility] in [place]. [facility] is [place] and son, Reuel Boom feels that this may be too far away. Now that he has weanedto trach collar, plan for  LTAC  Interdisciplinary Family  Meeting v Palliative Care Meeting:  NA  My cc time x 5435m  Cyril Mourningakesh Ramiz Turpin MD. FCCP. [facility] Pager [phone] If no response call [phone]   07/25/2016, 8:39 AM

## 2016-07-25 NOTE — Evaluation (Signed)
Clinical/Bedside Swallow Evaluation Patient Details  Name: Allen Hamilton MRN: [id number] Date of Birth: 1933/08/13  Today's Date: 07/25/2016 Time: SLP Start Time (ACUTE ONLY): 1302 SLP Stop Time (ACUTE ONLY): 1327 SLP Time Calculation (min) (ACUTE ONLY): 25 min  Past Medical History: History reviewed. No pertinent past medical history. Past Surgical History:  Past Surgical History:  Procedure Laterality Date  . IR GENERIC HISTORICAL  07/23/2016   IR US GUIDE VASC ACCESS RIGHT 07/23/2016 Berdine DanceMichael Shick, MD [facility]-INTERV RAD  . IR GENERIC HISTORICAL  07/23/2016   IR FLUORO GUIDE CV LINE RIGHT 07/23/2016 Berdine DanceMichael Shick, MD [facility]  . TRACHEOSTOMY TUBE PLACEMENT N/A 07/19/2016   Procedure: TRACHEOSTOMY;  Surgeon: Serena ColonelJefry Rosen, MD;  [facility];  Service: ENT;  Laterality: N/A;   HPI:  Pt is an 82 y/o m admitted after fall with C1 fx requiring ventilator management. Pt has been tolerating TC for 24 hours+.    Assessment / Plan / Recommendation Clinical Impression  Pt seen for clinical assessment of swallow function. Pt trialed with ice chips x 2 with PMSV in place. Pt appeared to orally manage single ice chips WFL, however pt demonstrated immediate weak cough after swallow. No further trials attempted given pt's current respiratory status and inability to follow directives. Recommend: NPO with alternate nutrition. Will follow.     Aspiration Risk  Severe aspiration risk    Diet Recommendation NPO;Alternative means - temporary   Medication Administration: Via alternative means    Other  Recommendations Oral Care Recommendations: Oral care BID   Follow up Recommendations  LTACH    Frequency and Duration min 2x/week          Prognosis Prognosis for Safe Diet Advancement: Fair Barriers to Reach Goals: Cognitive deficits;Severity of deficits;Behavior      Swallow Study   General Date of Onset: 07/12/16 HPI: Pt is an 82 y/o m admitted after fall with C1 fx requiring ventilator  management. Pt has been tolerating TC for 24 hours+.  Type of Study: Bedside Swallow Evaluation Previous Swallow Assessment: none known Diet Prior to this Study: NPO;NG Tube Temperature Spikes Noted: No Respiratory Status: Trach Collar (FiO2 35) History of Recent Intubation: Yes Length of Intubations (days): 8 days Date extubated: 07/19/16 Behavior/Cognition: Alert;Confused;Doesn't follow directions;Requires cueing Oral Cavity Assessment: Within Functional Limits Oral Care Completed by SLP: No Oral Cavity - Dentition: Adequate natural dentition;Missing dentition Patient Positioning: Upright in bed Baseline Vocal Quality: Not observed Volitional Cough: Cognitively unable to elicit Volitional Swallow: Unable to elicit    Oral/Motor/Sensory Function Overall Oral Motor/Sensory Function: Within functional limits (with observation, formal assessment not completed)   Ice Chips Ice chips: Impaired Presentation: Spoon Pharyngeal Phase Impairments: Suspected delayed Swallow;Cough - Immediate   Thin Liquid Thin Liquid: Not tested    Nectar Thick Nectar Thick Liquid: Not tested   Honey Thick Honey Thick Liquid: Not tested   Puree Puree: Not tested   Solid   GO   Solid: Not tested        Rocky CraftsKara E Caid Radin MA, CCC-SLP Pager [phone] 07/25/2016,2:01 PM

## 2016-07-25 NOTE — Evaluation (Signed)
Passy-Muir Speaking Valve - Evaluation Patient Details  Name: Allen Hamilton MRN: [id number] Date of Birth: May 24, 1933  Today's Date: 07/25/2016 Time: 1302-1327 SLP Time Calculation (min) (ACUTE ONLY): 25 min  Past Medical History: History reviewed. No pertinent past medical history. Past Surgical History:  Past Surgical History:  Procedure Laterality Date  . IR GENERIC HISTORICAL  07/23/2016   IR US GUIDE VASC ACCESS RIGHT 07/23/2016 Berdine DanceMichael Shick, MD [facility]INTER[facility]  . IR GENERIC HISTORICAL  07/23/2016   IR FLUORO GUIDE CV LINE RIGHT 07/23/2016 Berdine DanceMichael Shick, MD [facility]  . TRACHEOSTOMY TUBE PLACEMENT N/A 07/19/2016   Procedure: TRACHEOSTOMY;  Surgeon: Serena ColonelJefry Rosen, MD;  [facility];  Service: ENT;  Laterality: N/A;   HPI:  Pt is an 82 y/o m admitted after fall with C1 fx requiring ventilator management. Pt has been tolerating TC for 24 hours+.    Assessment / Plan / Recommendation Clinical Impression  Pt seen for initial PMSV trial. Pt tolerated valve placement for 10 minutes before he appeared to fatigue, however he did not make any attempts to phonate despite max verbal and visual cueing. Pt was able to clear secretions orally with yankeur. Will follow. PMSV with SLP only at this point.     SLP Assessment  Patient needs continued Speech Lanaguage Pathology Services    Follow Up Recommendations  LTACH    Frequency and Duration min 2x/week       PMSV Trial PMSV was placed for: 10 min Able to redirect subglottic air through upper airway: Yes Able to Attain Phonation: No attempt to phonate Able to Expectorate Secretions: Yes Level of Secretion Expectoration with PMSV: Oral Breath Support for Phonation: Mildly decreased Intelligibility: Unable to assess (comment) (no attempts at verbalizations) Respirations During Trial: 28 SpO2 During Trial: 92 % Pulse During Trial: 114 Behavior: Alert;Confused;Good eye contact;No attempt to communicate   Tracheostomy Tube        Vent Dependency  FiO2 (%): 35 %    Cuff Deflation Trial  GO Tolerated Cuff Deflation: Yes Length of Time for Cuff Deflation Trial: 15 min Behavior: Alert;Confused;Good eye contact        Rocky CraftsKara E Ramirez Fullbright MA, CCC-SLP Pager [phone] 07/25/2016, 1:50 PM

## 2016-07-25 NOTE — Progress Notes (Signed)
eLink Nursing ICU Electrolyte Replacement Protocol  Patient Name: Allen Hamilton DOB: 19-May-1933 MRN: [id number]  Date of Service  07/25/2016   HPI/Events of Note    Recent Labs Lab 07/21/16 0305 07/22/16 0508 07/23/16 0529 07/24/16 0302 07/25/16 0350  NA 144 144 148* 147* 144  K 3.5 3.4* 3.3* 3.6 3.1*  CL 100* 101 104 104 104  CO2 34* 36* 38* 34* 34*  GLUCOSE 126* 118* 130* 121* 201*  BUN 49* 48* 48* 44* 35*  CREATININE 0.88 0.83 0.82 0.84 0.67  CALCIUM 8.7* 8.7* 9.0 8.9 8.3*  MG  --  2.4 2.2 2.2 1.9  PHOS  --  2.8 3.0 4.1 3.4    Estimated Creatinine Clearance: 90.9 mL/min (by C-G formula based on SCr of 0.8 mg/dL).  Intake/Output      08/15 0701 - 08/16 0700   I.V. (mL/kg) 701.4 (6.5)   Other 90   NG/GT 1841.3   Total Intake(mL/kg) 2632.6 (24.2)   Urine (mL/kg/hr) 1440 (0.6)   Stool 200 (0.1)   Total Output 1640   Net +992.6        - I/O DETAILED x24h    Total I/O In: 749.1 [I.V.:359.1; Other:30; NG/GT:360] Out: 700 [Urine:700] - I/O THIS SHIFT    ASSESSMENT   eICURN Interventions  K+ 3.1 Replaced using ICU protocol   ASSESSMENT: MAJOR ELECTROLYTE    Merita NortonFrye, Irma Delancey Nicole 07/25/2016, 6:36 AM

## 2016-07-25 NOTE — Progress Notes (Signed)
ANTICOAGULATION CONSULT NOTE - f/u Consult  Pharmacy Consult for Heparin>lovenox Indication: New onset afib, prior DVT  Allergies  Allergen Reactions  . Hydromorphone     Respiratory depression    Patient Measurements: Height: 5\' 10"  (177.8 cm) Weight: 239 lb 10.2 oz (108.7 kg) IBW/kg (Calculated) : 73 Heparin Dosing Weight: 98.6  Vital Signs: Temp: 97.8 F (36.6 C) (08/16 0800) Temp Source: Oral (08/16 0800) BP: 113/61 (08/16 0800) Pulse Rate: 83 (08/16 0800)  Labs:  Recent Labs  07/23/16 0528  07/23/16 0529 07/23/16 1423 07/23/16 2241 07/24/16 0302 07/25/16 0350  HGB  --   < > 12.7*  --   --  12.6* 12.0*  HCT  --   --  41.5  --   --  41.3 39.8  PLT  --   --  288  --   --  262 254  APTT 106*  --   --   --   --   --   --   LABPROT 19.7*  --   --   --   --  20.1* 24.8*  INR 1.65  --   --   --   --  1.69 2.20  HEPARINUNFRC 0.75*  --   --  0.56 0.69 0.45  --   CREATININE  --   --  0.82  --   --  0.84 0.67  < > = values in this interval not displayed.  Estimated Creatinine Clearance: 90.9 mL/min (by C-G formula based on SCr of 0.8 mg/dL).  Assessment:  82 yo male with hx DVTs on chronic coumadin PTA.  Also with new onset Afib. Pharmacy is consulted to transition heparin to lovenox 8/15 + warfarin. INR 1.69>2.2 after several increased doses. Hg low stable, plt wnl, no bleed documented. Noted patient on cipro IV and amio - may affect INR.  **PTA warfarin dose: 8 mg Mon/Thurs, 9 mg all other days  Goal of Therapy:  INR 2-3  Monitor platelets by anticoagulation protocol: Yes   Plan:  - Lovenox 110mg  [place] q12h - d/c when INR therapeutic x2 (likely tomorrow) - Warfarin 9 mg x 1 dose tonight - Daily INR, CBC at least q72h - Monitor s/sx of bleeding   Babs BertinHaley Terrace Fontanilla, PharmD, BCPS Clinical Pharmacist 07/25/2016 8:58 AM

## 2016-07-25 NOTE — Care Management Note (Signed)
Case Management Note  Patient Details  Name: Allen Hamilton MRN: [id number] Date of Birth: [birth year]  Subjective/Objective:  [facility] facility per family's wishes.  Per attending, pt will be stable for transfer tomorrow, ambulance transport arranged for 3:00 PM by [employer].  [facility].                      Expected Discharge Plan:  Long Term Acute Care (LTAC)  Discharge planning Services  CM Consult  Status of Service:  In process, will continue to follow  Magdalene RiverMayo, Tyr Franca T, RN 07/25/2016, 3:09 PM

## 2016-07-26 ENCOUNTER — Inpatient Hospital Stay (HOSPITAL_COMMUNITY): Payer: Medicare Other

## 2016-07-26 LAB — CBC
HCT: 40.7 % (ref 39.0–52.0)
Hemoglobin: 12.4 g/dL — ABNORMAL LOW (ref 13.0–17.0)
MCH: 30 pg (ref 26.0–34.0)
MCHC: 30.5 g/dL (ref 30.0–36.0)
MCV: 98.5 fL (ref 78.0–100.0)
PLATELETS: 249 10*3/uL (ref 150–400)
RBC: 4.13 MIL/uL — AB (ref 4.22–5.81)
RDW: 14.4 % (ref 11.5–15.5)
WBC: 11.8 10*3/uL — ABNORMAL HIGH (ref 4.0–10.5)

## 2016-07-26 LAB — BASIC METABOLIC PANEL
ANION GAP: 4 — AB (ref 5–15)
BUN: 28 mg/dL — AB (ref 6–20)
CALCIUM: 8.4 mg/dL — AB (ref 8.9–10.3)
CO2: 35 mmol/L — ABNORMAL HIGH (ref 22–32)
CREATININE: 0.63 mg/dL (ref 0.61–1.24)
Chloride: 106 mmol/L (ref 101–111)
GFR calc Af Amer: >60 mL/min (ref >60)
GFR calc non Af Amer: >60 mL/min (ref >60)
GLUCOSE: 142 mg/dL — AB (ref 65–99)
POTASSIUM: 3.5 mmol/L (ref 3.5–5.1)
Sodium: 145 mmol/L (ref 135–145)

## 2016-07-26 LAB — GLUCOSE, CAPILLARY
GLUCOSE-CAPILLARY: 126 mg/dL — AB (ref 65–99)
Glucose-Capillary: 124 mg/dL — ABNORMAL HIGH (ref 65–99)
Glucose-Capillary: 128 mg/dL — ABNORMAL HIGH (ref 65–99)

## 2016-07-26 LAB — PROTIME-INR
INR: 2.26
Prothrombin Time: 25.3 seconds — ABNORMAL HIGH (ref 11.4–15.2)

## 2016-07-26 MED ORDER — FUROSEMIDE 10 MG/ML IJ SOLN
INTRAMUSCULAR | Status: AC
  Filled 2016-07-26: qty 4

## 2016-07-26 MED ORDER — ALBUTEROL SULFATE (2.5 MG/3ML) 0.083% IN NEBU: 2.5000 mg | INHALATION_SOLUTION | Freq: Four times a day (QID) | RESPIRATORY_TRACT | 12 refills | Status: AC

## 2016-07-26 MED ORDER — VITAL AF 1.2 CAL PO LIQD: ORAL | Status: AC

## 2016-07-26 MED ORDER — FAMOTIDINE 40 MG/5ML PO SUSR: 20.0000 mg | Freq: Every day | ORAL | 0 refills | Status: AC

## 2016-07-26 MED ORDER — FREE WATER: 200.0000 mL | Status: AC

## 2016-07-26 MED ORDER — CIPROFLOXACIN HCL 750 MG PO TABS: 750.0000 mg | ORAL_TABLET | Freq: Two times a day (BID) | ORAL | Status: AC

## 2016-07-26 MED ORDER — AMIODARONE HCL 200 MG PO TABS: 200.0000 mg | ORAL_TABLET | Freq: Every day | ORAL | Status: AC

## 2016-07-26 MED ORDER — HALOPERIDOL LACTATE 5 MG/ML IJ SOLN: 1.0000 mg | INTRAMUSCULAR | Status: AC | PRN

## 2016-07-26 MED ORDER — INSULIN ASPART 100 UNIT/ML ~~LOC~~ SOLN: SUBCUTANEOUS | 11 refills | Status: DC

## 2016-07-26 MED ORDER — POTASSIUM CHLORIDE 20 MEQ/15ML (10%) PO SOLN
40.0000 meq | Freq: Once | ORAL | Status: AC
  Administered 2016-07-26: 40 meq
  Filled 2016-07-26: qty 30

## 2016-07-26 MED ORDER — FUROSEMIDE 10 MG/ML IJ SOLN
40.0000 mg | Freq: Once | INTRAMUSCULAR | Status: AC
  Administered 2016-07-26: 40 mg via INTRAVENOUS
  Filled 2016-07-26: qty 4

## 2016-07-26 MED ORDER — METOPROLOL TARTRATE 25 MG/10 ML ORAL SUSPENSION: 25.0000 mg | Freq: Two times a day (BID) | ORAL | Status: AC

## 2016-07-26 MED ORDER — WARFARIN SODIUM 3 MG PO TABS: 9.0000 mg | ORAL_TABLET | Freq: Once | ORAL | Status: DC

## 2016-07-26 NOTE — Progress Notes (Signed)
Physical Therapy Treatment Patient Details Name: Allen Hamilton MRN: [id number] DOB: 1933/04/08 Today's Date: 07/26/2016    History of Present Illness [facility] after a fall and suffering a C1 fx. Pt developed resp failure and was intubated and [facility]. Extubated on 8/8 and emergently reintubated. Trached on 8/10. During eval pt on trach collar. Pt in cervical collar for C1 fx. No surgery needed.  PMH - HTN, DVT    PT Comments    Pt admitted with above diagnosis. Pt currently with functional limitations due to balance and endurance deficits. Pt sat EOB 8 minutes tolerating it fairly well initially.  Desat to mid 80's and laid pt back down. Nursing suctioning on departure as pt continues to secretions.  Pt needs continued therapy.  Pt will benefit from skilled PT to increase their independence and safety with mobility to allow discharge to the venue listed below.    Follow Up Recommendations  LTACH     Equipment Recommendations  Other (comment) (To be assessed)    Recommendations for Other Services       Precautions / Restrictions Precautions Precautions: Fall;Cervical Precaution Comments: trach collar  Required Braces or Orthoses: Cervical Brace Cervical Brace: Hard collar;At all times Restrictions Weight Bearing Restrictions: No    Mobility  Bed Mobility Overal bed mobility: Needs Assistance;+2 for physical assistance Bed Mobility: Rolling;Sidelying to Sit Rolling: Total assist;+2 for physical assistance Sidelying to sit: +2 for physical assistance;Total assist       General bed mobility comments: Pt needed total assist as he has difficulty initiating any movment.  Only inititated right LE movement.  Had to use pad and total assist to get pt to EOB.   Transfers                    Ambulation/Gait                 Stairs            Wheelchair Mobility    Modified Rankin (Stroke Patients Only)       Balance  Overall balance assessment: Needs assistance Sitting-balance support: Bilateral upper extremity supported;Feet supported Sitting balance-Leahy Scale: Poor Sitting balance - Comments: Pt was able to sit 8 minutes EOB with varying assist initially needing max assist but with verbal and tactile cues sat progressing to min guard assist for up to 10 seconds at a time and then would need min to mod resteadying assist. When pt began to fatigue, he leaned right and with incr anterior lean and sats began to drop to mid 80's.  HR up to 140 bpm therefore laid pt back down.   Postural control: Right lateral lean                          Cognition Arousal/Alertness: Awake/alert Behavior During Therapy: Restless Overall Cognitive Status: Difficult to assess                      Exercises      General Comments General comments (skin integrity, edema, etc.): Nurse suctioned pt before treatment and after treatment and had to call RT after treatment for breathing treatment.       Pertinent Vitals/Pain Pain Assessment: Faces Faces Pain Scale: Hurts little more Pain Location: generalized Pain Descriptors / Indicators: Grimacing;Guarding Pain Intervention(s): Limited activity within patient's tolerance;Monitored during session;Premedicated before session;Repositioned  HR 94-141 bpm, 158/89 initially  and 159/92 at end of treatment; Pt 90-91% on 35% trach collar on arrival therefore Incr trach collar from 35% to 40% for activity and sats in low 90's initially when sitting.  HR began to incr and sats low 80's once sitting for 8 min, therefore laid pt down and nursing suctioned.  Sats hovering in low 80's therefore nurse turned pt up to 60% and sats still 87-89% after 3 min rest.  Nursing called RT who came to give breathing treatment.       Home Living                      Prior Function            PT Goals (current goals can now be found in the care plan section) Progress  towards PT goals: Progressing toward goals    Frequency  Min 3X/week    PT Plan Current plan remains appropriate    Co-evaluation             End of Session Equipment Utilized During Treatment: Cervical collar;Oxygen (trach collar) Activity Tolerance: Patient limited by fatigue (desats at end of sitting and needed incr O2) Patient left: in bed;with call bell/phone within reach;with nursing/sitter in room     Time: 1144-1210 PT Time Calculation (min) (ACUTE ONLY): 26 min  Charges:  $Therapeutic Activity: 23-37 mins                    G CodesTawni Millers:      Jacoby Ritsema F 07/26/2016, 12:48 PM [employer] CorporationDawn Sabrin Dunlevy,PT Acute Rehabilitation [phone] (pager)

## 2016-07-26 NOTE — Progress Notes (Signed)
Arrived to bedside and transport team present and ready to be moved to the transport stretcher. Pt will be place on transport vent [facility]. Will cont to monitor.

## 2016-07-26 NOTE — Progress Notes (Signed)
50 mcg Fentanyl wasted in sharps. Witnessed by Mena Goesasheena Adahan RN.  Raciel Caffrey, Blanchard KelchStephanie Ingold

## 2016-07-26 NOTE — Progress Notes (Signed)
Dr. Vassie LollAlva at bedside, verbal order to d/c foley and place condom catheter. Orders enacted.  Verbal order ok to transfer to LTAC today. MD to place orders. Will continue to closely monitor patient.  Allen Hamilton, Blanchard KelchStephanie Ingold

## 2016-07-26 NOTE — Progress Notes (Signed)
eLink Nursing ICU Electrolyte Replacement Protocol  Patient Name: Nunzio Cobbsaul G Vila DOB: Oct 09, 1933 MRN: [id number]  Date of Service  07/26/2016   HPI/Events of Note    Recent Labs Lab 07/22/16 0508 07/23/16 0529 07/24/16 0302 07/25/16 0350 07/26/16 0402  NA 144 148* 147* 144 145  K 3.4* 3.3* 3.6 3.1* 3.5  CL 101 104 104 104 106  CO2 36* 38* 34* 34* 35*  GLUCOSE 118* 130* 121* 201* 142*  BUN 48* 48* 44* 35* 28*  CREATININE 0.83 0.82 0.84 0.67 0.63  CALCIUM 8.7* 9.0 8.9 8.3* 8.4*  MG 2.4 2.2 2.2 1.9  --   PHOS 2.8 3.0 4.1 3.4  --     Estimated Creatinine Clearance: 90.8 mL/min (by C-G formula based on SCr of 0.8 mg/dL).  Intake/Output      08/16 0701 - 08/17 0700   I.V. (mL/kg) 300.9 (2.8)   NG/GT 2210   Total Intake(mL/kg) 2510.9 (23.1)   Urine (mL/kg/hr) 1165 (0.4)   Stool 300 (0.1)   Total Output 1465   Net +1045.9        - I/O DETAILED x24h    Total I/O In: 880 [I.V.:100; NG/GT:780] Out: 510 [Urine:510] - I/O THIS SHIFT    ASSESSMENT   eICURN Interventions  K+3.5 Unable to replace due to urine output   ASSESSMENT: MAJOR ELECTROLYTE    Merita NortonFrye, Bland Rudzinski Nicole 07/26/2016, 5:17 AM

## 2016-07-26 NOTE — Care Management Important Message (Signed)
Important Message  Patient Details  Name: Allen Hamilton MRN: [id number] Date of Birth: Oct 04, 1933   Medicare Important Message Given:  Yes    Kenishia Plack Abena 07/26/2016, 11:40 AM

## 2016-07-26 NOTE — Progress Notes (Signed)
ANTICOAGULATION CONSULT NOTE - f/u Consult  Pharmacy Consult for warfarin Indication: New onset afib, prior DVT  Allergies  Allergen Reactions  . Hydromorphone     Respiratory depression    Patient Measurements: Height: 5\' 10"  (177.8 cm) Weight: 239 lb 6.7 oz (108.6 kg) IBW/kg (Calculated) : 73 Heparin Dosing Weight: 98.6  Vital Signs: Temp: 98.8 F (37.1 C) (08/17 0746) Temp Source: Oral (08/17 0746) BP: 125/83 (08/17 0915) Pulse Rate: 123 (08/17 0915)  Labs:  Recent Labs  07/23/16 1423 07/23/16 2241  07/24/16 0302 07/25/16 0350 07/26/16 0402  HGB  --   --   < > 12.6* 12.0* 12.4*  HCT  --   --   --  41.3 39.8 40.7  PLT  --   --   --  262 254 249  LABPROT  --   --   --  20.1* 24.8* 25.3*  INR  --   --   --  1.69 2.20 2.26  HEPARINUNFRC 0.56 0.69  --  0.45  --   --   CREATININE  --   --   --  0.84 0.67 0.63  < > = values in this interval not displayed.  Estimated Creatinine Clearance: 90.8 mL/min (by C-G formula based on SCr of 0.8 mg/dL).  Assessment:  82 yo male with hx DVTs on chronic coumadin PTA.  Also with new onset Afib. Pharmacy is consulted to dose warfarin. Lovenox d/c'd 8/16. INR remains therapeutic 2.26 this AM. Hg low stable, plt wnl, no bleed documented. Noted patient on cipro PT and amio - may affect INR.  **PTA warfarin dose: 8 mg Mon/Thurs, 9 mg all other days  Goal of Therapy:  INR 2-3  Monitor platelets by anticoagulation protocol: Yes   Plan:  - Warfarin 9mg  x 1 dose tonight - Daily INR - Monitor s/sx of bleeding   Babs BertinHaley Wynter Grave, PharmD, BCPS Clinical Pharmacist 07/26/2016 9:32 AM

## 2016-07-26 NOTE — Progress Notes (Signed)
Called to bedside for increased WOB, increased secretions, mild hypoxemia after pt worked with PT.  Pt dyspneic but fairly comfortable.  Copious thin, pink, frothy secretions.  Sats 90% on 40% FiO2 ATC.  Few bibasilar crackles, coarse rhonchi.   PLAN -  Lasix 40mg  IV x1  Stat CXR  Place back on vent for now  Titrate FiO2 to keep sats >92% [facility]  Wean back to ATC as Allen Hamilton   [name], NP 07/26/2016  2:39 PM Pager: [phone] or [phone]) [phone]

## 2016-07-26 NOTE — Discharge Summary (Signed)
Physician Discharge Summary  Patient ID: Allen Hamilton MRN: [id number] DOB/AGE: 06-30-1933 82 y.o.  Admit date: 07/12/2016 Discharge date: 07/26/2016    Discharge Diagnoses:  Active Problems:   Respiratory failure (HCC)   Closed fracture of first cervical vertebra (HCC)   Closed fracture of sixth cervical vertebra (HCC)   Essential hypertension   Acute encephalopathy   Hyperlipidemia   Aspiration into airway   Acute respiratory failure with hypoxemia (HCC)   Lung collapse   Tracheostomy status ([place])                                                       D/c plan by Discharge Diagnosis  Acute encephalopathy > improving, favor ICU delerium Displaced C1 and C6 fx's - s/p mechanical fall Seen by neurosurgery, no role for surgery.   P:   Continue C collar x 6 weeks total PRN fentanyl, haldol    Acute respiratory failure with hypoxemia  Aspiration pneumonia RLL Collapse/atelectasis RLL Atelectasis, collapse resolved rt / asp 8/8 failed extubation >> tstomy P:    ATC  As tolerated Pulmonary toilet abx complete  F/u CXR  Mobilize    Hx HTN, HLD, DVT on coumadin 8/10 New onset AF-RVR P:  Continue amiodarone - anticipate 4-6 wks Lopressor 25 PO bid Continue atorvastatin Coumadin per pharmacy    AKI - resolved,  Hypokalemia Hypernatremia P:   Monitor BMET and UOP Replace electrolytes as needed Continue free water    GI prophylaxis Nutrition P:   SUP: Pantoprazole Continue TF  Repeat swallow eval soon -- failed swallow eval 8/16  Aspiration pneumonia RLL  Penile discharge Loose stools- CDiff neg  P:   cipro - plan 7 days total -- stop date 8/20   Brief Summary: Allen Hamilton is a 82 y/o male with HTN, HLD and DVT on coumadin admitted 8/3 with a C1 fracture post fall, intubated, neurologically intact, has RLL aspiration pneumonia and mucus plugging. He lives in [place] and on 8/1, was checking on some houses that he was apparently having built  and unfortunately suffered a mechanical fall after tripping over a hole causing him to strike his head. On 8/2, he went to [facility] where CT scan revealed a displaced C1 fx involving both arches. He was placed in a brace and was given pain meds for pain management. After receiving pain meds, he had desaturations and developed respiratory insufficiency. He was started on BiPAP but later complained of difficulty swallowing. Due to concern for airway compromise, he was subsequently intubated and tx to [facility].  He was treated with IV abx for aspiration, maintained on mechanical ventilatory support and seen in consultation by neurosurgery who indicated no role for surgery.  He failed attempt at extubation and underwent tracheostomy placement on 8/9 by ENT. He was subsequently weaned from ventilatory and is tolerating ATC 24/7 since 8/15.  Course was c/b ICU delirium, new onset AFib with RVR and AKI.    At the time of discharge he is improving slowly, tolerating ATC at all times, maintained on oral amiodarone, lopressor and coumadin and ready for d/c to LTAC for ongoing rehab efforts.  He will need f/u with neurosurgery in aprrox 6 weeks.     STUDIES:  CT head 8/2 > displaced fx of C1 along with fx of C6  CULTURES: Sputum 8/3 > few GNR, nl flora Gonorrhea 8/3 >neg Chlamydia 8/3 >neg 8/8 sputum>> nml flora 8/10 c dif>>neg 8/13 sputum >>e coli pan S  ANTIBIOTICS: Unasyn 8/3 > 8/10 cipro 8/13 >>  SIGNIFICANT EVENTS: 8/2 > admitted at [facility] with C1 and C6 fx, intubated for resp insufficiency 8/3 > transferred to [facility] ICU 8/11 new onset AF-RVR  LINES/TUBES: ETT 8/3 > 8/8 Re-ETT 8/8 >> tstomy 8/10 >> PICC 8/14 >>   Vitals:   07/26/16 0824 07/26/16 0900 07/26/16 0915 07/26/16 1000  BP: (!) 157/96 138/71 125/83 117/70  Pulse: (!) 111 (!) 106 (!) 123 94  Resp: (!) 28 (!) 29  (!) 30  Temp:      TempSrc:      SpO2: 99% 94%  96%  Weight:      Height:         Discharge  Labs  BMET  Recent Labs Lab 07/22/16 0508 07/23/16 0529 07/24/16 0302 07/25/16 0350 07/26/16 0402  NA 144 148* 147* 144 145  K 3.4* 3.3* 3.6 3.1* 3.5  CL 101 104 104 104 106  CO2 36* 38* 34* 34* 35*  GLUCOSE 118* 130* 121* 201* 142*  BUN 48* 48* 44* 35* 28*  CREATININE 0.83 0.82 0.84 0.67 0.63  CALCIUM 8.7* 9.0 8.9 8.3* 8.4*  MG 2.4 2.2 2.2 1.9  --   PHOS 2.8 3.0 4.1 3.4  --      CBC   Recent Labs Lab 07/24/16 0302 07/25/16 0350 07/26/16 0402  HGB 12.6* 12.0* 12.4*  HCT 41.3 39.8 40.7  WBC 11.9* 12.9* 11.8*  PLT 262 254 249   Anti-Coagulation  Recent Labs Lab 07/22/16 0508 07/23/16 0528 07/24/16 0302 07/25/16 0350 07/26/16 0402  INR 1.55 1.65 1.69 2.20 2.26              Medication List    STOP taking these medications   amLODipine 5 MG tablet Commonly known as:  NORVASC   enoxaparin 80 MG/0.8ML injection Commonly known as:  LOVENOX   lisinopril 20 MG tablet Commonly known as:  PRINIVIL,ZESTRIL     TAKE these medications   albuterol (2.5 MG/3ML) 0.083% nebulizer solution Commonly known as:  PROVENTIL Take 3 mLs (2.5 mg total) by nebulization every 6 (six) hours.   amiodarone 200 MG tablet Commonly known as:  PACERONE Place 1 tablet (200 mg total) into feeding tube daily.   atorvastatin 20 MG tablet Commonly known as:  LIPITOR Take 20 mg by mouth daily.   ciprofloxacin 750 MG tablet Commonly known as:  CIPRO Place 1 tablet (750 mg total) into feeding tube 2 (two) times daily.   famotidine 40 MG/5ML suspension Commonly known as:  PEPCID Take 2.5 mLs (20 mg total) by mouth at bedtime.   feeding supplement (VITAL AF 1.2 CAL) Liqd 3m/hr   free water Soln Place 200 mLs into feeding tube every 4 (four) hours.   haloperidol lactate 5 MG/ML injection Commonly known as:  HALDOL Inject 0.2-0.8 mLs (1-4 mg total) into the vein every 3 (three) hours as needed (for agitated delirium).   metoprolol tartrate 25 mg/10 mL  Susp Commonly known as:  LOPRESSOR Place 10 mLs (25 mg total) into feeding tube 2 (two) times daily.   warfarin 4 MG tablet Commonly known as:  COUMADIN Take 4-8 mg by mouth. Pt to take 8 mg Mon and Thursday, all other days was to take 9 mg What changed:  Another medication with the same name was removed. Continue taking  this medication, and follow the directions you see here.         Disposition: LTAC   Discharged Condition: Allen Hamilton has met maximum benefit of inpatient care and is medically stable and cleared for discharge.  Patient is pending follow up as above.      Time spent on disposition:  Greater than 35 minutes.   SignedNickolas Madrid, NP 07/26/2016  10:36 AM Pager: [phone] or [phone]

## 2016-07-26 NOTE — Progress Notes (Signed)
07/26/2016  Dr. Vassie LollAlva paged and made aware of increased WOB, frothy secretions with frequent suctioning and intermittent desaturations into the mid 70's requiring increasing FIo2 to 80% for brief periods. Verbal orders received ok to place back on ventilator if needed, Lasix 40 mg IV x 1, STAT PCXR, d/c free water flushes. Orders enacted. Colin BroachKathy Whiteheart NP at bedside to review CXR and see pt. Verbal order ok to still transfer pt. LTAC on ventilator. 50 mcg Fentanyl given to patient prior to placing on ventilator on prior ventilator settings. Report and interventions enacted given to [facility] transport prior to d/c to LTAC. At discharge pt. 97% on VC rate 14/peep 5/40% FIo2 VT 500. Report called to receiving facility. Questions and concerns addressed.  Jakaleb Payer, Blanchard KelchStephanie Ingold

## 2016-08-08 ENCOUNTER — Inpatient Hospital Stay
Admit: 2016-08-08 | Discharge: 2016-08-09 | Disposition: A | Discharge status: Skilled Nursing Facility | Payer: MEDICARE | Attending: Emergency Medicine

## 2016-08-08 DIAGNOSIS — J9503 Malfunction of tracheostomy stoma: Secondary | ICD-10-CM

## 2016-08-08 NOTE — ED Notes (Signed)
Patient loaded to [facility] stretcher. No acute distress noted. Wheeled out on stretcher on vent and monitor.

## 2016-08-08 NOTE — ED Triage Notes (Signed)
Patient arrives via EMS from [facility] for concern of airleak. Patient arrives with trach and on vent. Patient has C1 and C6 fracture and is at [facility] due to respiratory failure. Patient in no acute distress en route.

## 2016-08-08 NOTE — ED Notes (Signed)
[facility] at bedside

## 2016-08-08 NOTE — ED Notes (Signed)
Respiratory at bedside.

## 2016-08-08 NOTE — ED Provider Notes (Signed)
HPI Comments: 82 y.o. male with past medical history significant for C1 cervical fracture, C6 cervical fracture, HTN, hyperlipidemia, thromboembolus, and atrial fibrillation who presents from [facility] with chief complaint of tracheostomy problem. Patient arrives via EMS for a possible tracheostomy air leak. Patient has a feeding tube through his nose, tracheal tube, cervical collar, and foley catheter in place. Full history, physical exam, and ROS unable to be obtained due to:  unresponsive. There are no other acute medical concerns at this time.    Social hx: no EtOH use, no drug use  PCP: No primary care provider on file.    Note written by Jaclyn Prime, Scribe, as dictated by Thornton Papas, MD 7:15 PM    The history is provided by the EMS personnel and medical records. The history is limited by the condition of the patient and a developmental delay. No language interpreter was used.        Past Medical History:   Diagnosis Date   ??? Atrial fibrillation (HCC)    ??? C1 cervical fracture (HCC)    ??? C6 cervical fracture (HCC)    ??? Hyperlipemia    ??? Hypertension    ??? Thromboembolus (HCC)        History reviewed. No pertinent surgical history.      History reviewed. No pertinent family history.    Social History     Social History   ??? Marital status: N/A     Spouse name: N/A   ??? Number of children: N/A   ??? Years of education: N/A     Occupational History   ??? Not on file.     Social History Main Topics   ??? Smoking status: Unknown If Ever Smoked   ??? Smokeless tobacco: Not on file   ??? Alcohol use No   ??? Drug use: No   ??? Sexual activity: Not on file     Other Topics Concern   ??? Not on file     Social History Narrative   ??? No narrative on file         ALLERGIES: Hydromorphone    Review of Systems   Unable to perform ROS: Patient unresponsive       Vitals:    08/08/16 1859   BP: 145/81   Pulse: (!) 111   Resp: 14   Temp: 97.4 ??F (36.3 ??C)   SpO2: 96%   Weight: 117.8 kg (259 lb 11.2 oz)             Physical Exam   Constitutional: He is oriented to person, place, and time. He appears well-developed and well-nourished. No distress. Cervical collar in place.   Unable to move  Responds by opening eyes   HENT:   Head: Normocephalic and atraumatic.   Right Ear: External ear normal.   Left Ear: External ear normal.   Nose: Nose normal.   Mouth/Throat: Oropharynx is clear and moist.   Eyes: Conjunctivae and EOM are normal. Pupils are equal, round, and reactive to light. No scleral icterus.   Neck: Normal range of motion. Neck supple. No JVD present. No tracheal deviation present. No thyromegaly present.   Cardiovascular: Normal rate, regular rhythm and normal heart sounds.  Exam reveals no gallop and no friction rub.    No murmur heard.  Pulmonary/Chest: Effort normal and breath sounds normal. No respiratory distress. He has no wheezes. He has no rales. He exhibits no tenderness.   Abdominal: Soft. He exhibits no distension and  no mass. Bowel sounds are decreased. There is no tenderness. There is no rebound and no guarding.   Musculoskeletal: Normal range of motion. He exhibits no edema or tenderness.   Lymphadenopathy:     He has no cervical adenopathy.   Neurological: He is alert and oriented to person, place, and time. He has normal strength. He displays no atrophy and no tremor. No cranial nerve deficit. He exhibits normal muscle tone. Coordination and gait normal.   Skin: Skin is warm and dry. No rash noted. He is not diaphoretic. No erythema.   Psychiatric: He has a normal mood and affect. His behavior is normal. Judgment and thought content normal.   Nursing note and vitals reviewed.  Note written by Jaclyn PrimeMounikasai Talari, Scribe, as dictated by Thornton PapasLuis F Saphronia Ozdemir, MD 7:15 PM    MDM  Number of Diagnoses or Management Options  Encounter for tracheostomy tube change [facility](HCC):   Diagnosis management comments: Impression: A 82 year old male referred from [facility] for trach leak.     The patient has been assessed by both myself and respiratory tach, he has been suctioned both from above and below the trachea, tracheal cough has been blown up several times and it does appear to be leaking.    Plan of care we'll change over trach and then discharged back to [facility].    I've discussed this with the on-call physician at [facility] to accept the patient. He will be formally discharged from our emergency department.    ED Course       Procedures

## 2016-08-08 NOTE — ED Notes (Signed)
Respiratory at bedside changing trach. Patient tolerated well.

## 2016-08-08 NOTE — ED Notes (Signed)
Report called to [facility]. They are expecting patient back. Contacted [facility] for transport. Will continue to monitor.

## 2016-10-10 ENCOUNTER — Encounter (FREE_STANDING_LABORATORY_FACILITY): Payer: Medicare Other

## 2016-10-10 DIAGNOSIS — I159 Secondary hypertension, unspecified: Secondary | ICD-10-CM

## 2016-10-10 DIAGNOSIS — A159 Respiratory tuberculosis unspecified: Secondary | ICD-10-CM

## 2016-10-10 DIAGNOSIS — I6350 Cerebral infarction due to unspecified occlusion or stenosis of unspecified cerebral artery: Secondary | ICD-10-CM

## 2016-10-10 DIAGNOSIS — I499 Cardiac arrhythmia, unspecified: Secondary | ICD-10-CM

## 2016-10-10 LAB — BASIC METABOLIC PANEL
BUN: 34 mg/dL — ABNORMAL HIGH (ref 9.0–28.0)
CO2: 36 mEq/L — ABNORMAL HIGH (ref 21–30)
Calcium: 9 mg/dL (ref 7.9–10.2)
Chloride: 100 mEq/L (ref 100–111)
Creatinine: 0.6 mg/dL (ref 0.5–1.5)
Glucose: 103 mg/dL — ABNORMAL HIGH (ref 70–100)
Potassium: 4.5 mEq/L (ref 3.5–5.1)
Sodium: 143 mEq/L (ref 135–146)

## 2016-10-10 LAB — CBC
Absolute NRBC: 0 10*3/uL
Hematocrit: 34.2 % — ABNORMAL LOW (ref 42.0–52.0)
Hgb: 10.5 g/dL — ABNORMAL LOW (ref 13.0–17.0)
MCH: 29.5 pg (ref 28.0–32.0)
MCHC: 30.7 g/dL — ABNORMAL LOW (ref 32.0–36.0)
MCV: 96.1 fL (ref 80.0–100.0)
MPV: 11.8 fL (ref 9.4–12.3)
Nucleated RBC: 0 /100 WBC (ref 0.0–1.0)
Platelets: 205 10*3/uL (ref 140–400)
RBC: 3.56 10*6/uL — ABNORMAL LOW (ref 4.70–6.00)
RDW: 16 % — ABNORMAL HIGH (ref 12–15)
WBC: 9.87 10*3/uL (ref 3.50–10.80)

## 2016-10-10 LAB — PT/INR
PT INR: 1.2 — ABNORMAL HIGH (ref 0.9–1.1)
PT: 15.7 s — ABNORMAL HIGH (ref 12.6–15.0)

## 2016-10-10 LAB — GFR: EGFR: >60

## 2016-10-10 LAB — HEMOLYSIS INDEX: Hemolysis Index: 6 (ref 0–18)

## 2016-10-17 ENCOUNTER — Encounter (FREE_STANDING_LABORATORY_FACILITY): Payer: Medicare Other

## 2016-10-17 DIAGNOSIS — I10 Essential (primary) hypertension: Secondary | ICD-10-CM

## 2016-10-17 DIAGNOSIS — E785 Hyperlipidemia, unspecified: Secondary | ICD-10-CM

## 2016-10-17 DIAGNOSIS — D649 Anemia, unspecified: Secondary | ICD-10-CM

## 2016-10-17 DIAGNOSIS — I159 Secondary hypertension, unspecified: Secondary | ICD-10-CM

## 2016-10-17 LAB — BASIC METABOLIC PANEL
BUN: 22 mg/dL (ref 9.0–28.0)
CO2: 35 mEq/L — ABNORMAL HIGH (ref 21–30)
Calcium: 8.6 mg/dL (ref 7.9–10.2)
Chloride: 95 mEq/L — ABNORMAL LOW (ref 100–111)
Creatinine: 0.6 mg/dL (ref 0.5–1.5)
Glucose: 116 mg/dL — ABNORMAL HIGH (ref 70–100)
Potassium: 4.2 mEq/L (ref 3.5–5.1)
Sodium: 140 mEq/L (ref 135–146)

## 2016-10-17 LAB — CBC
Absolute NRBC: 0 10*3/uL
Hematocrit: 35.6 % — ABNORMAL LOW (ref 42.0–52.0)
Hgb: 11.1 g/dL — ABNORMAL LOW (ref 13.0–17.0)
MCH: 29.4 pg (ref 28.0–32.0)
MCHC: 31.2 g/dL — ABNORMAL LOW (ref 32.0–36.0)
MCV: 94.2 fL (ref 80.0–100.0)
MPV: 11.5 fL (ref 9.4–12.3)
Nucleated RBC: 0 /100 WBC (ref 0.0–1.0)
Platelets: 260 10*3/uL (ref 140–400)
RBC: 3.78 10*6/uL — ABNORMAL LOW (ref 4.70–6.00)
RDW: 15 % (ref 12–15)
WBC: 13.14 10*3/uL — ABNORMAL HIGH (ref 3.50–10.80)

## 2016-10-17 LAB — HEMOLYSIS INDEX: Hemolysis Index: 4 (ref 0–18)

## 2016-10-17 LAB — GFR: EGFR: >60

## 2016-10-18 ENCOUNTER — Encounter (FREE_STANDING_LABORATORY_FACILITY): Payer: Medicare Other

## 2016-10-18 DIAGNOSIS — N390 Urinary tract infection, site not specified: Secondary | ICD-10-CM

## 2016-10-18 LAB — URINALYSIS
Bilirubin, UA: NEGATIVE
Blood, UA: NEGATIVE
Glucose, UA: NEGATIVE
Ketones UA: NEGATIVE
Nitrite, UA: NEGATIVE
Protein, UR: NEGATIVE
Specific Gravity UA: 1.013 (ref 1.001–1.035)
Urine pH: 8.5 — AB (ref 5.0–8.0)
Urobilinogen, UA: 0.2

## 2016-10-18 LAB — URINE MICROSCOPIC

## 2016-10-19 ENCOUNTER — Encounter (FREE_STANDING_LABORATORY_FACILITY): Payer: Medicare Other

## 2016-10-19 DIAGNOSIS — D72829 Elevated white blood cell count, unspecified: Secondary | ICD-10-CM

## 2016-10-19 DIAGNOSIS — D649 Anemia, unspecified: Secondary | ICD-10-CM

## 2016-10-19 LAB — CBC
Absolute NRBC: 0 10*3/uL
Hematocrit: 33.9 % — ABNORMAL LOW (ref 42.0–52.0)
Hgb: 10.4 g/dL — ABNORMAL LOW (ref 13.0–17.0)
MCH: 28.5 pg (ref 28.0–32.0)
MCHC: 30.7 g/dL — ABNORMAL LOW (ref 32.0–36.0)
MCV: 92.9 fL (ref 80.0–100.0)
MPV: 11.3 fL (ref 9.4–12.3)
Nucleated RBC: 0 /100 WBC (ref 0.0–1.0)
Platelets: 273 10*3/uL (ref 140–400)
RBC: 3.65 10*6/uL — ABNORMAL LOW (ref 4.70–6.00)
RDW: 15 % (ref 12–15)
WBC: 11.04 10*3/uL — ABNORMAL HIGH (ref 3.50–10.80)

## 2016-10-24 ENCOUNTER — Encounter (FREE_STANDING_LABORATORY_FACILITY): Payer: Medicare Other

## 2016-10-24 DIAGNOSIS — D72829 Elevated white blood cell count, unspecified: Secondary | ICD-10-CM

## 2016-10-24 DIAGNOSIS — E785 Hyperlipidemia, unspecified: Secondary | ICD-10-CM

## 2016-10-24 DIAGNOSIS — I10 Essential (primary) hypertension: Secondary | ICD-10-CM

## 2016-10-24 LAB — HEMOLYSIS INDEX: Hemolysis Index: 36 — ABNORMAL HIGH (ref 0–18)

## 2016-10-24 LAB — CBC
Absolute NRBC: 0 10*3/uL
Hematocrit: 34 % — ABNORMAL LOW (ref 42.0–52.0)
Hgb: 10.5 g/dL — ABNORMAL LOW (ref 13.0–17.0)
MCH: 28.5 pg (ref 28.0–32.0)
MCHC: 30.9 g/dL — ABNORMAL LOW (ref 32.0–36.0)
MCV: 92.1 fL (ref 80.0–100.0)
MPV: 11.3 fL (ref 9.4–12.3)
Nucleated RBC: 0 /100 WBC (ref 0.0–1.0)
Platelets: 279 10*3/uL (ref 140–400)
RBC: 3.69 10*6/uL — ABNORMAL LOW (ref 4.70–6.00)
RDW: 15 % (ref 12–15)
WBC: 12.07 10*3/uL — ABNORMAL HIGH (ref 3.50–10.80)

## 2016-10-24 LAB — BASIC METABOLIC PANEL
BUN: 27 mg/dL (ref 9.0–28.0)
CO2: 31 mEq/L — ABNORMAL HIGH (ref 21–29)
Calcium: 8.6 mg/dL (ref 7.9–10.2)
Chloride: 97 mEq/L — ABNORMAL LOW (ref 100–111)
Creatinine: 0.7 mg/dL (ref 0.5–1.5)
Glucose: 95 mg/dL (ref 70–100)
Potassium: 4.6 mEq/L (ref 3.5–5.1)
Sodium: 139 mEq/L (ref 135–146)

## 2016-10-24 LAB — GFR: EGFR: >60

## 2016-10-31 ENCOUNTER — Encounter (FREE_STANDING_LABORATORY_FACILITY): Payer: Medicare Other

## 2016-10-31 DIAGNOSIS — D72829 Elevated white blood cell count, unspecified: Secondary | ICD-10-CM

## 2016-10-31 DIAGNOSIS — J9601 Acute respiratory failure with hypoxia: Secondary | ICD-10-CM

## 2016-10-31 LAB — BASIC METABOLIC PANEL
BUN: 30 mg/dL — ABNORMAL HIGH (ref 9.0–28.0)
CO2: 32 mEq/L — ABNORMAL HIGH (ref 21–29)
Calcium: 8.7 mg/dL (ref 7.9–10.2)
Chloride: 98 mEq/L — ABNORMAL LOW (ref 100–111)
Creatinine: 0.6 mg/dL (ref 0.5–1.5)
Glucose: 97 mg/dL (ref 70–100)
Potassium: 4.7 mEq/L (ref 3.5–5.1)
Sodium: 138 mEq/L (ref 135–146)

## 2016-10-31 LAB — CBC
Absolute NRBC: 0 10*3/uL
Hematocrit: 33.3 % — ABNORMAL LOW (ref 42.0–52.0)
Hgb: 10.3 g/dL — ABNORMAL LOW (ref 13.0–17.0)
MCH: 28.5 pg (ref 28.0–32.0)
MCHC: 30.9 g/dL — ABNORMAL LOW (ref 32.0–36.0)
MCV: 92.2 fL (ref 80.0–100.0)
MPV: 11.1 fL (ref 9.4–12.3)
Nucleated RBC: 0 /100 WBC (ref 0.0–1.0)
Platelets: 302 10*3/uL (ref 140–400)
RBC: 3.61 10*6/uL — ABNORMAL LOW (ref 4.70–6.00)
RDW: 14 % (ref 12–15)
WBC: 11 10*3/uL — ABNORMAL HIGH (ref 3.50–10.80)

## 2016-10-31 LAB — GFR: EGFR: >60

## 2016-10-31 LAB — HEMOLYSIS INDEX: Hemolysis Index: 0 (ref 0–18)

## 2016-11-07 ENCOUNTER — Encounter (FREE_STANDING_LABORATORY_FACILITY): Payer: Medicare Other

## 2016-11-07 DIAGNOSIS — I159 Secondary hypertension, unspecified: Secondary | ICD-10-CM

## 2016-11-07 DIAGNOSIS — I4891 Unspecified atrial fibrillation: Secondary | ICD-10-CM

## 2016-11-07 DIAGNOSIS — D649 Anemia, unspecified: Secondary | ICD-10-CM

## 2016-11-07 DIAGNOSIS — R785 Finding of other psychotropic drug in blood: Secondary | ICD-10-CM

## 2016-11-07 LAB — GFR: EGFR: >60

## 2016-11-07 LAB — BASIC METABOLIC PANEL
BUN: 26 mg/dL (ref 9.0–28.0)
CO2: 28 mEq/L (ref 21–29)
Calcium: 8.6 mg/dL (ref 7.9–10.2)
Chloride: 100 mEq/L (ref 100–111)
Creatinine: 0.7 mg/dL (ref 0.5–1.5)
Glucose: 100 mg/dL (ref 70–100)
Potassium: 4.1 mEq/L (ref 3.5–5.1)
Sodium: 138 mEq/L (ref 135–146)

## 2016-11-07 LAB — HEMOLYSIS INDEX: Hemolysis Index: 2 (ref 0–18)

## 2016-11-07 LAB — CBC
Absolute NRBC: 0 10*3/uL
Hematocrit: 34.6 % — ABNORMAL LOW (ref 42.0–52.0)
Hgb: 10.8 g/dL — ABNORMAL LOW (ref 13.0–17.0)
MCH: 28.4 pg (ref 28.0–32.0)
MCHC: 31.2 g/dL — ABNORMAL LOW (ref 32.0–36.0)
MCV: 91.1 fL (ref 80.0–100.0)
MPV: 10.9 fL (ref 9.4–12.3)
Nucleated RBC: 0 /100 WBC (ref 0.0–1.0)
Platelets: 324 10*3/uL (ref 140–400)
RBC: 3.8 10*6/uL — ABNORMAL LOW (ref 4.70–6.00)
RDW: 14 % (ref 12–15)
WBC: 11.07 10*3/uL — ABNORMAL HIGH (ref 3.50–10.80)

## 2016-11-08 IMAGING — CR DG CHEST 1V PORT
1 series · 1 of 1 positions shown · non-contrast
Comparison: 07/17/2016

CLINICAL DATA: Pneumonia

EXAM:
PORTABLE CHEST 1 VIEW

[AP]
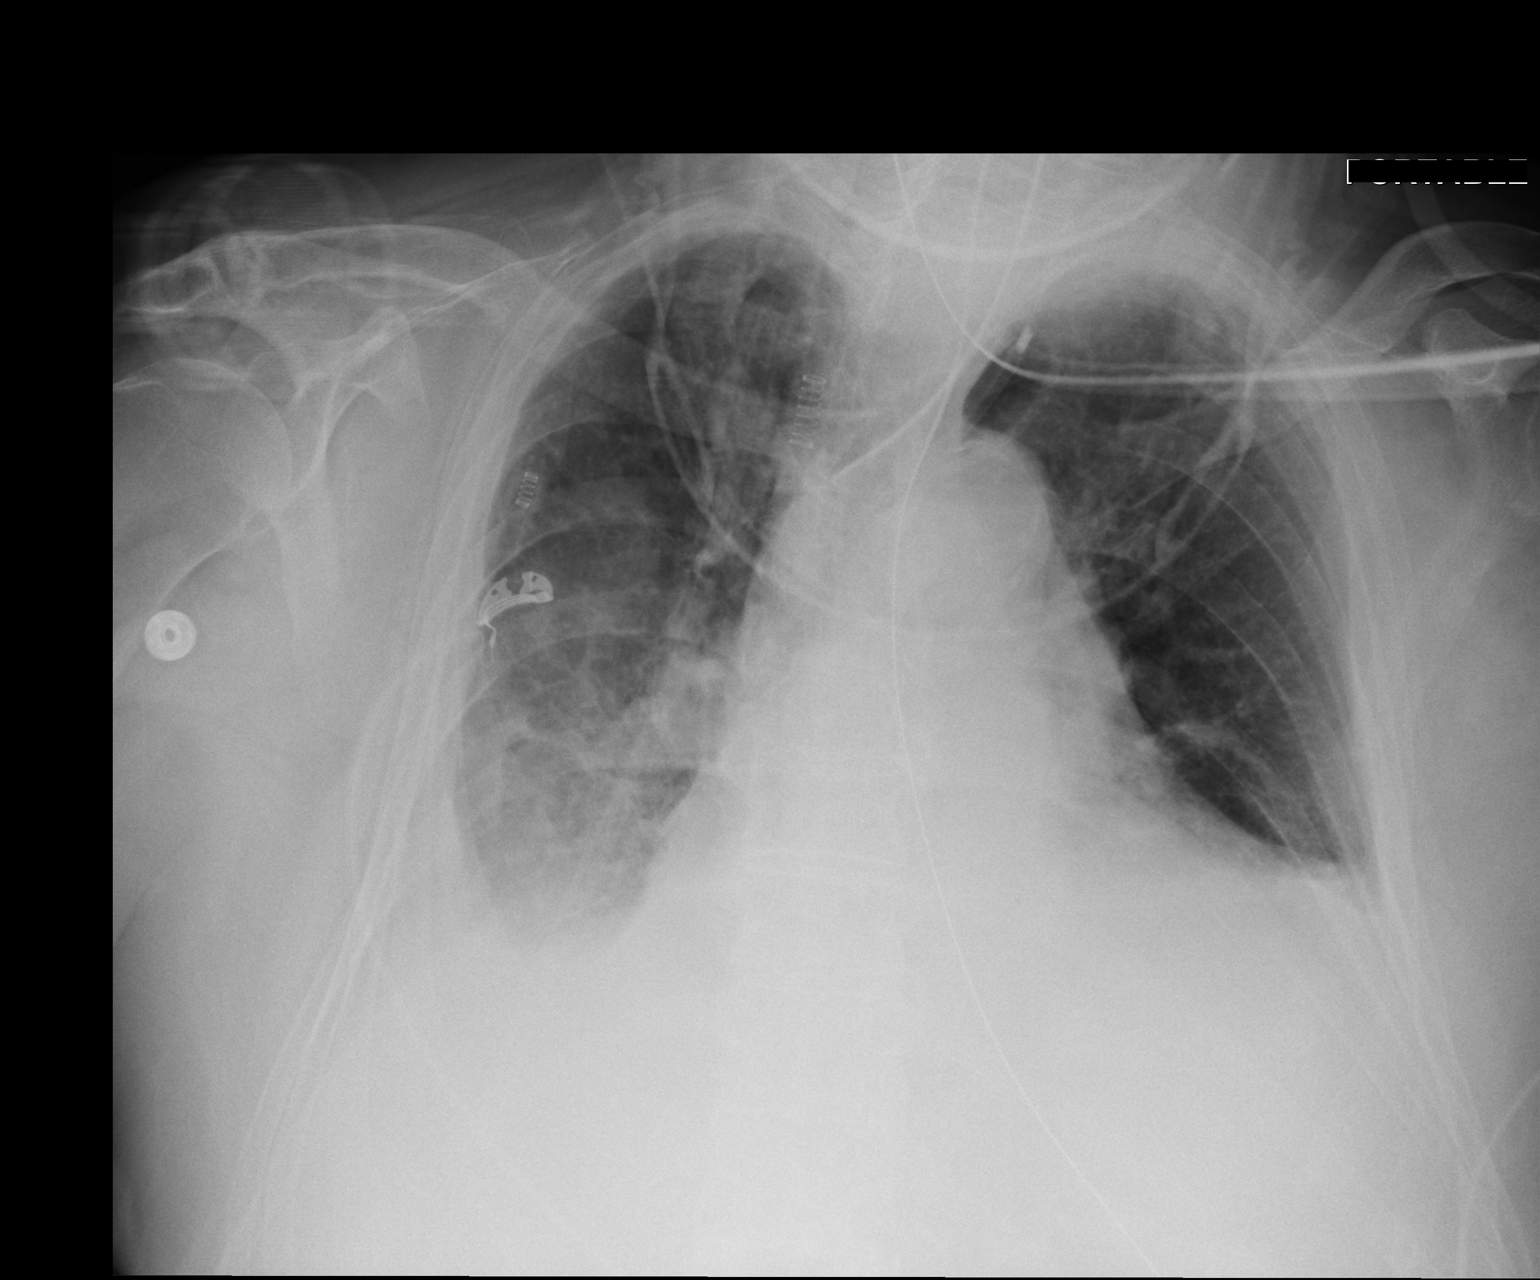

[1 of 1 positions shown; findings below may reference images not displayed]

FINDINGS: Cardiomediastinal silhouette is stable. Persistent small right
pleural effusion with right basilar atelectasis or infiltrate. Left
basilar atelectasis stable. No pulmonary edema. Endotracheal tube is
unchanged in position with tip 2.5 cm above the carina. Stable NG
tube position.
IMPRESSION: Stable support apparatus. Persistent small right pleural effusion
with right basilar atelectasis or infiltrate. Left basilar
atelectasis. No pulmonary edema.

## 2016-11-14 ENCOUNTER — Encounter (FREE_STANDING_LABORATORY_FACILITY): Payer: Medicare Other

## 2016-11-14 DIAGNOSIS — I159 Secondary hypertension, unspecified: Secondary | ICD-10-CM

## 2016-11-14 DIAGNOSIS — D72829 Elevated white blood cell count, unspecified: Secondary | ICD-10-CM

## 2016-11-14 DIAGNOSIS — N390 Urinary tract infection, site not specified: Secondary | ICD-10-CM

## 2016-11-14 LAB — URINALYSIS
Bilirubin, UA: NEGATIVE
Glucose, UA: NEGATIVE
Ketones UA: NEGATIVE
Leukocyte Esterase, UA: NEGATIVE
Nitrite, UA: NEGATIVE
Protein, UR: NEGATIVE
Specific Gravity UA: 1.013 (ref 1.001–1.035)
Urine pH: 5.5 (ref 5.0–8.0)
Urobilinogen, UA: 0.2

## 2016-11-14 LAB — CBC
Absolute NRBC: 0 10*3/uL
Hematocrit: 35.3 % — ABNORMAL LOW (ref 42.0–52.0)
Hgb: 10.8 g/dL — ABNORMAL LOW (ref 13.0–17.0)
MCH: 27.8 pg — ABNORMAL LOW (ref 28.0–32.0)
MCHC: 30.6 g/dL — ABNORMAL LOW (ref 32.0–36.0)
MCV: 90.7 fL (ref 80.0–100.0)
MPV: 11 fL (ref 9.4–12.3)
Nucleated RBC: 0 /100 WBC (ref 0.0–1.0)
Platelets: 325 10*3/uL (ref 140–400)
RBC: 3.89 10*6/uL — ABNORMAL LOW (ref 4.70–6.00)
RDW: 15 % (ref 12–15)
WBC: 17.18 10*3/uL — ABNORMAL HIGH (ref 3.50–10.80)

## 2016-11-14 LAB — URINE MICROSCOPIC

## 2016-11-14 LAB — BASIC METABOLIC PANEL
BUN: 23 mg/dL (ref 9.0–28.0)
CO2: 31 mEq/L — ABNORMAL HIGH (ref 21–29)
Calcium: 8.9 mg/dL (ref 7.9–10.2)
Chloride: 96 mEq/L — ABNORMAL LOW (ref 100–111)
Creatinine: 0.6 mg/dL (ref 0.5–1.5)
Glucose: 108 mg/dL — ABNORMAL HIGH (ref 70–100)
Potassium: 4.5 mEq/L (ref 3.5–5.1)
Sodium: 137 mEq/L (ref 136–145)

## 2016-11-14 LAB — GFR: EGFR: >60

## 2016-11-14 LAB — HEMOLYSIS INDEX: Hemolysis Index: 22 — ABNORMAL HIGH (ref 0–18)

## 2016-11-14 IMAGING — CR DG CHEST 1V PORT
1 series · 1 of 1 positions shown · non-contrast
Comparison: 07/22/2016 and earlier.

CLINICAL DATA: 82-year-old male with respiratory failure.
Tracheostomy. Initial encounter.

EXAM:
PORTABLE CHEST 1 VIEW

[AP]
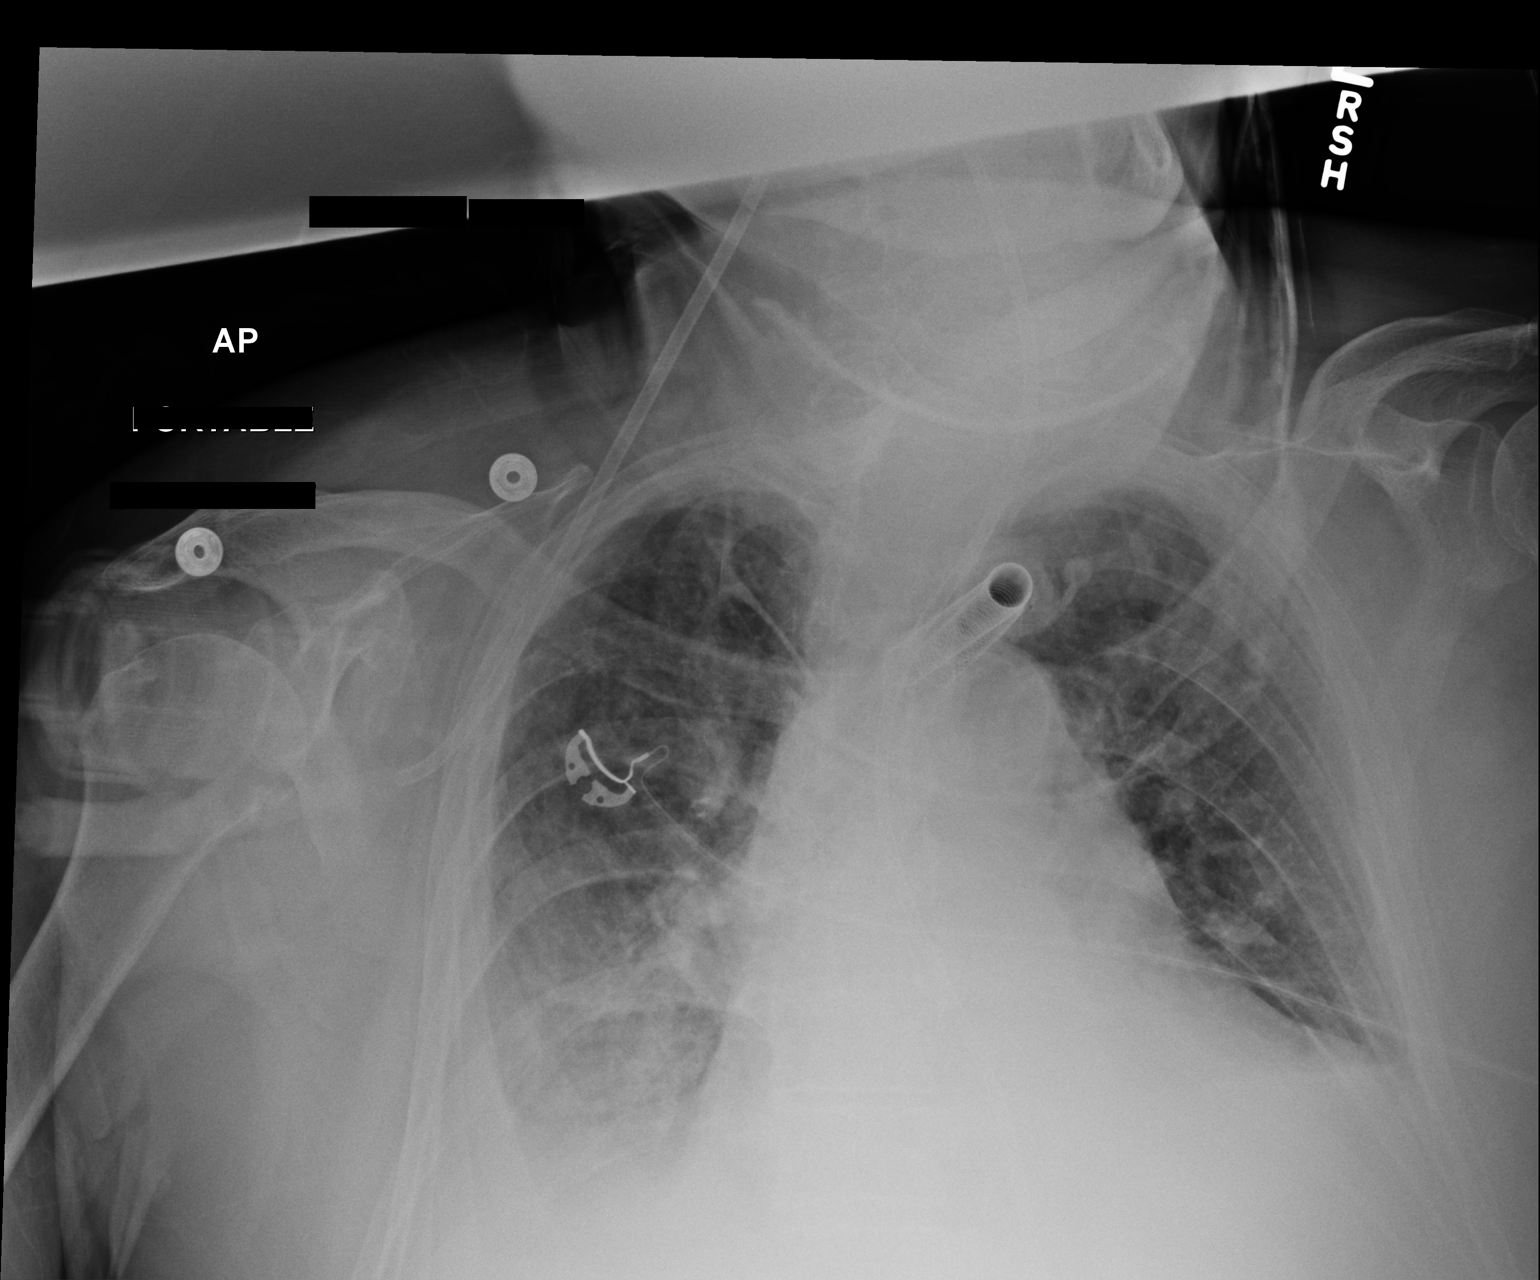

[1 of 1 positions shown; findings below may reference images not displayed]

FINDINGS: Portable AP semi upright view at 9111 hours. The patient is rotated
toward the left. Allowing for this, the tracheostomy tube appears
stable. Enteric feeding tube courses to the abdomen, tip not
included.

Continued low lung volumes with bilateral pleural effusions.
Associated patchy and confluent bibasilar opacity is stable. No
pneumothorax. Pulmonary vascularity remain stable. Overall
ventilation has not significantly changed since 07/18/2016.

Stable cardiac size and mediastinal contours.
IMPRESSION: 1. Stable tracheostomy and visible enteric feeding tube.
2. Stable ventilation. Bilateral pleural effusions with bibasilar
collapse or consolidation.

## 2016-11-16 ENCOUNTER — Encounter (FREE_STANDING_LABORATORY_FACILITY): Payer: Medicare Other

## 2016-11-16 DIAGNOSIS — I10 Essential (primary) hypertension: Secondary | ICD-10-CM

## 2016-11-16 DIAGNOSIS — D72829 Elevated white blood cell count, unspecified: Secondary | ICD-10-CM

## 2016-11-16 DIAGNOSIS — I4891 Unspecified atrial fibrillation: Secondary | ICD-10-CM

## 2016-11-16 LAB — CBC
Absolute NRBC: 0 10*3/uL
Hematocrit: 36.7 % — ABNORMAL LOW (ref 42.0–52.0)
Hgb: 11.2 g/dL — ABNORMAL LOW (ref 13.0–17.0)
MCH: 27.9 pg — ABNORMAL LOW (ref 28.0–32.0)
MCHC: 30.5 g/dL — ABNORMAL LOW (ref 32.0–36.0)
MCV: 91.3 fL (ref 80.0–100.0)
MPV: 11 fL (ref 9.4–12.3)
Nucleated RBC: 0 /100 WBC (ref 0.0–1.0)
Platelets: 331 10*3/uL (ref 140–400)
RBC: 4.02 10*6/uL — ABNORMAL LOW (ref 4.70–6.00)
RDW: 15 % (ref 12–15)
WBC: 14.31 10*3/uL — ABNORMAL HIGH (ref 3.50–10.80)

## 2016-11-16 LAB — HEMOLYSIS INDEX: Hemolysis Index: 15 (ref 0–18)

## 2016-11-16 LAB — BASIC METABOLIC PANEL
BUN: 26 mg/dL (ref 9.0–28.0)
CO2: 31 mEq/L — ABNORMAL HIGH (ref 21–29)
Calcium: 8.5 mg/dL (ref 7.9–10.2)
Chloride: 97 mEq/L — ABNORMAL LOW (ref 100–111)
Creatinine: 0.6 mg/dL (ref 0.5–1.5)
Glucose: 106 mg/dL — ABNORMAL HIGH (ref 70–100)
Potassium: 4.6 mEq/L (ref 3.5–5.1)
Sodium: 139 mEq/L (ref 136–145)

## 2016-11-16 LAB — GFR: EGFR: >60

## 2016-11-21 ENCOUNTER — Encounter (FREE_STANDING_LABORATORY_FACILITY): Payer: Medicare Other

## 2016-11-21 DIAGNOSIS — J9601 Acute respiratory failure with hypoxia: Secondary | ICD-10-CM

## 2016-11-21 LAB — HEMOLYSIS INDEX: Hemolysis Index: 9 (ref 0–18)

## 2016-11-21 LAB — BASIC METABOLIC PANEL
BUN: 37 mg/dL — ABNORMAL HIGH (ref 9.0–28.0)
CO2: 32 mEq/L — ABNORMAL HIGH (ref 21–29)
Calcium: 8.8 mg/dL (ref 7.9–10.2)
Chloride: 95 mEq/L — ABNORMAL LOW (ref 100–111)
Creatinine: 0.7 mg/dL (ref 0.5–1.5)
Glucose: 109 mg/dL — ABNORMAL HIGH (ref 70–100)
Potassium: 4.6 mEq/L (ref 3.5–5.1)
Sodium: 136 mEq/L (ref 136–145)

## 2016-11-21 LAB — CBC
Absolute NRBC: 0 10*3/uL
Hematocrit: 35.5 % — ABNORMAL LOW (ref 42.0–52.0)
Hgb: 10.9 g/dL — ABNORMAL LOW (ref 13.0–17.0)
MCH: 27.6 pg — ABNORMAL LOW (ref 28.0–32.0)
MCHC: 30.7 g/dL — ABNORMAL LOW (ref 32.0–36.0)
MCV: 89.9 fL (ref 80.0–100.0)
MPV: 11 fL (ref 9.4–12.3)
Nucleated RBC: 0 /100 WBC (ref 0.0–1.0)
Platelets: 336 10*3/uL (ref 140–400)
RBC: 3.95 10*6/uL — ABNORMAL LOW (ref 4.70–6.00)
RDW: 15 % (ref 12–15)
WBC: 16.04 10*3/uL — ABNORMAL HIGH (ref 3.50–10.80)

## 2016-11-21 LAB — GFR: EGFR: >60

## 2016-11-22 ENCOUNTER — Encounter (FREE_STANDING_LABORATORY_FACILITY): Payer: Medicare Other

## 2016-11-22 DIAGNOSIS — J9601 Acute respiratory failure with hypoxia: Secondary | ICD-10-CM

## 2016-11-22 LAB — PROCALCITONIN: Procalcitonin: 0.1 (ref 0.0–0.1)

## 2016-11-28 ENCOUNTER — Encounter (FREE_STANDING_LABORATORY_FACILITY): Payer: Medicare Other

## 2016-11-28 DIAGNOSIS — I10 Essential (primary) hypertension: Secondary | ICD-10-CM

## 2016-11-28 LAB — BASIC METABOLIC PANEL
BUN: 32 mg/dL — ABNORMAL HIGH (ref 9.0–28.0)
CO2: 35 mEq/L — ABNORMAL HIGH (ref 21–29)
Calcium: 8.9 mg/dL (ref 7.9–10.2)
Chloride: 94 mEq/L — ABNORMAL LOW (ref 100–111)
Creatinine: 0.6 mg/dL (ref 0.5–1.5)
Glucose: 100 mg/dL (ref 70–100)
Potassium: 4.3 mEq/L (ref 3.5–5.1)
Sodium: 138 mEq/L (ref 136–145)

## 2016-11-28 LAB — HEMOLYSIS INDEX: Hemolysis Index: 1 (ref 0–18)

## 2016-11-28 LAB — GFR: EGFR: >60

## 2016-11-28 LAB — CBC
Absolute NRBC: 0 10*3/uL
Hematocrit: 39.2 % — ABNORMAL LOW (ref 42.0–52.0)
Hgb: 12 g/dL — ABNORMAL LOW (ref 13.0–17.0)
MCH: 27.8 pg — ABNORMAL LOW (ref 28.0–32.0)
MCHC: 30.6 g/dL — ABNORMAL LOW (ref 32.0–36.0)
MCV: 90.7 fL (ref 80.0–100.0)
MPV: 10.8 fL (ref 9.4–12.3)
Nucleated RBC: 0 /100 WBC (ref 0.0–1.0)
Platelets: 316 10*3/uL (ref 140–400)
RBC: 4.32 10*6/uL — ABNORMAL LOW (ref 4.70–6.00)
RDW: 15 % (ref 12–15)
WBC: 13.51 10*3/uL — ABNORMAL HIGH (ref 3.50–10.80)

## 2016-12-05 ENCOUNTER — Encounter (FREE_STANDING_LABORATORY_FACILITY): Payer: Medicare Other

## 2016-12-05 DIAGNOSIS — J9610 Chronic respiratory failure, unspecified whether with hypoxia or hypercapnia: Secondary | ICD-10-CM

## 2016-12-05 LAB — CBC
Absolute NRBC: 0 10*3/uL
Hematocrit: 38.7 % — ABNORMAL LOW (ref 42.0–52.0)
Hgb: 11.6 g/dL — ABNORMAL LOW (ref 13.0–17.0)
MCH: 27.2 pg — ABNORMAL LOW (ref 28.0–32.0)
MCHC: 30 g/dL — ABNORMAL LOW (ref 32.0–36.0)
MCV: 90.8 fL (ref 80.0–100.0)
MPV: 11.2 fL (ref 9.4–12.3)
Nucleated RBC: 0 /100 WBC (ref 0.0–1.0)
Platelets: 302 10*3/uL (ref 140–400)
RBC: 4.26 10*6/uL — ABNORMAL LOW (ref 4.70–6.00)
RDW: 15 % (ref 12–15)
WBC: 12.49 10*3/uL — ABNORMAL HIGH (ref 3.50–10.80)

## 2016-12-05 LAB — BASIC METABOLIC PANEL
BUN: 36 mg/dL — ABNORMAL HIGH (ref 9.0–28.0)
CO2: 38 mEq/L — ABNORMAL HIGH (ref 21–29)
Calcium: 8.9 mg/dL (ref 7.9–10.2)
Chloride: 93 mEq/L — ABNORMAL LOW (ref 100–111)
Creatinine: 0.7 mg/dL (ref 0.5–1.5)
Glucose: 87 mg/dL (ref 70–100)
Potassium: 4.6 mEq/L (ref 3.5–5.1)
Sodium: 140 mEq/L (ref 136–145)

## 2016-12-05 LAB — GFR: EGFR: >60

## 2016-12-05 LAB — HEMOLYSIS INDEX: Hemolysis Index: 12 (ref 0–18)

## 2016-12-06 ENCOUNTER — Encounter (FREE_STANDING_LABORATORY_FACILITY): Payer: Medicare Other

## 2016-12-06 DIAGNOSIS — J9601 Acute respiratory failure with hypoxia: Secondary | ICD-10-CM

## 2016-12-12 LAB — GFR: EGFR: >60

## 2016-12-12 LAB — BASIC METABOLIC PANEL
BUN: 24 mg/dL (ref 9.0–28.0)
CO2: 35 mEq/L — ABNORMAL HIGH (ref 21–29)
Calcium: 8.5 mg/dL (ref 7.9–10.2)
Chloride: 91 mEq/L — ABNORMAL LOW (ref 100–111)
Creatinine: 0.6 mg/dL (ref 0.5–1.5)
Glucose: 100 mg/dL (ref 70–100)
Potassium: 4.9 mEq/L (ref 3.5–5.1)
Sodium: 138 mEq/L (ref 136–145)

## 2016-12-12 LAB — CBC
Absolute NRBC: 0 10*3/uL
Hematocrit: 38.4 % — ABNORMAL LOW (ref 42.0–52.0)
Hgb: 11.5 g/dL — ABNORMAL LOW (ref 13.0–17.0)
MCH: 27.3 pg — ABNORMAL LOW (ref 28.0–32.0)
MCHC: 29.9 g/dL — ABNORMAL LOW (ref 32.0–36.0)
MCV: 91 fL (ref 80.0–100.0)
MPV: 11.7 fL (ref 9.4–12.3)
Nucleated RBC: 0 /100 WBC (ref 0.0–1.0)
Platelets: 282 10*3/uL (ref 140–400)
RBC: 4.22 10*6/uL — ABNORMAL LOW (ref 4.70–6.00)
RDW: 16 % — ABNORMAL HIGH (ref 12–15)
WBC: 15.45 10*3/uL — ABNORMAL HIGH (ref 3.50–10.80)

## 2016-12-12 LAB — HEMOLYSIS INDEX: Hemolysis Index: 86 — ABNORMAL HIGH (ref 0–18)

## 2016-12-13 LAB — B-TYPE NATRIURETIC PEPTIDE: B-Natriuretic Peptide: 143 pg/mL — ABNORMAL HIGH (ref 0–100)

## 2016-12-13 LAB — PROCALCITONIN: Procalcitonin: <0.1 (ref 0.0–0.1)

## 2016-12-14 ENCOUNTER — Emergency Department: Payer: Medicare Other

## 2016-12-14 ENCOUNTER — Inpatient Hospital Stay
Admission: EM | Admit: 2016-12-14 | Discharge: 2016-12-27 | DRG: 870 | Disposition: A | Discharge status: Skilled Nursing Facility | Payer: Medicare Other | Attending: Internal Medicine | Admitting: Internal Medicine

## 2016-12-14 ENCOUNTER — Inpatient Hospital Stay: Payer: Medicare Other | Admitting: Critical Care Medicine

## 2016-12-14 DIAGNOSIS — B965 Pseudomonas (aeruginosa) (mallei) (pseudomallei) as the cause of diseases classified elsewhere: Secondary | ICD-10-CM | POA: Diagnosis present

## 2016-12-14 DIAGNOSIS — I4891 Unspecified atrial fibrillation: Secondary | ICD-10-CM | POA: Diagnosis present

## 2016-12-14 DIAGNOSIS — H9190 Unspecified hearing loss, unspecified ear: Secondary | ICD-10-CM | POA: Diagnosis present

## 2016-12-14 DIAGNOSIS — E872 Acidosis: Secondary | ICD-10-CM | POA: Diagnosis not present

## 2016-12-14 DIAGNOSIS — I130 Hypertensive heart and chronic kidney disease with heart failure and stage 1 through stage 4 chronic kidney disease, or unspecified chronic kidney disease: Secondary | ICD-10-CM | POA: Diagnosis present

## 2016-12-14 DIAGNOSIS — J151 Pneumonia due to Pseudomonas: Secondary | ICD-10-CM | POA: Diagnosis present

## 2016-12-14 DIAGNOSIS — K922 Gastrointestinal hemorrhage, unspecified: Secondary | ICD-10-CM | POA: Diagnosis present

## 2016-12-14 DIAGNOSIS — R4182 Altered mental status, unspecified: Secondary | ICD-10-CM

## 2016-12-14 DIAGNOSIS — Z9981 Dependence on supplemental oxygen: Secondary | ICD-10-CM

## 2016-12-14 DIAGNOSIS — G9340 Encephalopathy, unspecified: Secondary | ICD-10-CM | POA: Diagnosis present

## 2016-12-14 DIAGNOSIS — J189 Pneumonia, unspecified organism: Secondary | ICD-10-CM

## 2016-12-14 DIAGNOSIS — R509 Fever, unspecified: Secondary | ICD-10-CM | POA: Diagnosis present

## 2016-12-14 DIAGNOSIS — R0902 Hypoxemia: Secondary | ICD-10-CM

## 2016-12-14 DIAGNOSIS — I5022 Chronic systolic (congestive) heart failure: Secondary | ICD-10-CM | POA: Diagnosis present

## 2016-12-14 DIAGNOSIS — J9621 Acute and chronic respiratory failure with hypoxia: Secondary | ICD-10-CM | POA: Diagnosis present

## 2016-12-14 DIAGNOSIS — J9622 Acute and chronic respiratory failure with hypercapnia: Secondary | ICD-10-CM | POA: Diagnosis present

## 2016-12-14 DIAGNOSIS — Z7901 Long term (current) use of anticoagulants: Secondary | ICD-10-CM

## 2016-12-14 DIAGNOSIS — Y95 Nosocomial condition: Secondary | ICD-10-CM | POA: Diagnosis present

## 2016-12-14 DIAGNOSIS — R6521 Severe sepsis with septic shock: Secondary | ICD-10-CM | POA: Diagnosis present

## 2016-12-14 DIAGNOSIS — H109 Unspecified conjunctivitis: Secondary | ICD-10-CM | POA: Diagnosis present

## 2016-12-14 DIAGNOSIS — Z930 Tracheostomy status: Secondary | ICD-10-CM

## 2016-12-14 DIAGNOSIS — I82621 Acute embolism and thrombosis of deep veins of right upper extremity: Secondary | ICD-10-CM | POA: Diagnosis not present

## 2016-12-14 DIAGNOSIS — K5900 Constipation, unspecified: Secondary | ICD-10-CM | POA: Diagnosis present

## 2016-12-14 DIAGNOSIS — A419 Sepsis, unspecified organism: Principal | ICD-10-CM | POA: Diagnosis present

## 2016-12-14 DIAGNOSIS — J9690 Respiratory failure, unspecified, unspecified whether with hypoxia or hypercapnia: Secondary | ICD-10-CM | POA: Diagnosis present

## 2016-12-14 DIAGNOSIS — J9620 Acute and chronic respiratory failure, unspecified whether with hypoxia or hypercapnia: Secondary | ICD-10-CM

## 2016-12-14 DIAGNOSIS — D689 Coagulation defect, unspecified: Secondary | ICD-10-CM | POA: Diagnosis present

## 2016-12-14 DIAGNOSIS — Z931 Gastrostomy status: Secondary | ICD-10-CM

## 2016-12-14 DIAGNOSIS — E162 Hypoglycemia, unspecified: Secondary | ICD-10-CM | POA: Diagnosis not present

## 2016-12-14 DIAGNOSIS — J690 Pneumonitis due to inhalation of food and vomit: Secondary | ICD-10-CM | POA: Diagnosis present

## 2016-12-14 DIAGNOSIS — D696 Thrombocytopenia, unspecified: Secondary | ICD-10-CM | POA: Diagnosis present

## 2016-12-14 DIAGNOSIS — N189 Chronic kidney disease, unspecified: Secondary | ICD-10-CM | POA: Diagnosis present

## 2016-12-14 DIAGNOSIS — Z9889 Other specified postprocedural states: Secondary | ICD-10-CM

## 2016-12-14 DIAGNOSIS — N390 Urinary tract infection, site not specified: Secondary | ICD-10-CM | POA: Diagnosis present

## 2016-12-14 DIAGNOSIS — Z515 Encounter for palliative care: Secondary | ICD-10-CM | POA: Diagnosis present

## 2016-12-14 DIAGNOSIS — D649 Anemia, unspecified: Secondary | ICD-10-CM | POA: Diagnosis present

## 2016-12-14 HISTORY — DX: Unspecified conjunctivitis: H10.9

## 2016-12-14 HISTORY — DX: Essential (primary) hypertension: I10

## 2016-12-14 HISTORY — DX: Insomnia, unspecified: G47.00

## 2016-12-14 HISTORY — DX: Disorder of kidney and ureter, unspecified: N28.9

## 2016-12-14 HISTORY — DX: Respiratory arrest: R09.2

## 2016-12-14 HISTORY — DX: Other chronic pain: G89.29

## 2016-12-14 HISTORY — DX: Cutaneous abscess of buttock: L02.31

## 2016-12-14 HISTORY — DX: Constipation, unspecified: K59.00

## 2016-12-14 HISTORY — DX: Dysphagia, unspecified: R13.10

## 2016-12-14 HISTORY — DX: Unspecified displaced fracture of second cervical vertebra, initial encounter for closed fracture: S12.100A

## 2016-12-14 LAB — BLOOD GAS, ARTERIAL
Arterial Total CO2: 37.1 mEq/L — ABNORMAL HIGH (ref 24.0–30.0)
Arterial Total CO2: 36.9 mEq/L — ABNORMAL HIGH (ref 24.0–30.0)
Arterial Total CO2: 32 mEq/L — ABNORMAL HIGH (ref 24.0–30.0)
Base Excess, Arterial: 14.2 mEq/L — ABNORMAL HIGH (ref <=2.0)
Base Excess, Arterial: 11.6 mEq/L — ABNORMAL HIGH (ref <=2.0)
Base Excess, Arterial: 10.8 mEq/L — ABNORMAL HIGH (ref <=2.0)
FIO2: 50 %
FIO2: 40 %
FIO2: 60 %
HCO3, Arterial: 40.9 mEq/L — ABNORMAL HIGH (ref 23.0–29.0)
HCO3, Arterial: 40.3 mEq/L — ABNORMAL HIGH (ref 23.0–29.0)
HCO3, Arterial: 35.2 mEq/L — ABNORMAL HIGH (ref 23.0–29.0)
O2 Flow: 10 L/min
O2 Sat, Arterial: 100 % (ref 95.0–100.0)
O2 Sat, Arterial: 94.2 % — ABNORMAL LOW (ref 95.0–100.0)
O2 Sat, Arterial: 99.1 % (ref 95.0–100.0)
PEEP: 5
PEEP: 8
Rate: 14 {beats}/min
Rate: 20 {beats}/min
Temperature: 37
Temperature: 37
Temperature: 37
Tidal vol.: 400
Tidal vol.: 500
pCO2, Arterial: 63.2 mmHg (ref 35.0–45.0)
pCO2, Arterial: 78.3 mmHg (ref 35.0–45.0)
pCO2, Arterial: 46 mmHg — ABNORMAL HIGH (ref 35.0–45.0)
pH, Arterial: 7.427 (ref 7.350–7.450)
pH, Arterial: 7.332 — ABNORMAL LOW (ref 7.350–7.450)
pH, Arterial: 7.495 — ABNORMAL HIGH (ref 7.350–7.450)
pO2, Arterial: 169 mmHg — ABNORMAL HIGH (ref 80.0–90.0)
pO2, Arterial: 69.1 mmHg — ABNORMAL LOW (ref 80.0–90.0)
pO2, Arterial: 141 mmHg — ABNORMAL HIGH (ref 80.0–90.0)

## 2016-12-14 LAB — CBC AND DIFFERENTIAL
Absolute NRBC: 0 10*3/uL
Basophils Absolute Automated: 0.08 10*3/uL (ref 0.00–0.20)
Basophils Automated: 0.4 %
Eosinophils Absolute Automated: 0.36 10*3/uL (ref 0.00–0.70)
Eosinophils Automated: 1.9 %
Hematocrit: 37.9 % — ABNORMAL LOW (ref 42.0–52.0)
Hgb: 11.2 g/dL — ABNORMAL LOW (ref 13.0–17.0)
Immature Granulocytes Absolute: 0.1 10*3/uL — ABNORMAL HIGH
Immature Granulocytes: 0.5 %
Lymphocytes Absolute Automated: 1.58 10*3/uL (ref 0.50–4.40)
Lymphocytes Automated: 8.2 %
MCH: 27.2 pg — ABNORMAL LOW (ref 28.0–32.0)
MCHC: 29.6 g/dL — ABNORMAL LOW (ref 32.0–36.0)
MCV: 92 fL (ref 80.0–100.0)
MPV: 10.1 fL (ref 9.4–12.3)
Monocytes Absolute Automated: 2.23 10*3/uL — ABNORMAL HIGH (ref 0.00–1.20)
Monocytes: 11.6 %
Neutrophils Absolute: 14.94 10*3/uL — ABNORMAL HIGH (ref 1.80–8.10)
Neutrophils: 77.4 %
Nucleated RBC: 0 /100 WBC (ref 0.0–1.0)
Platelets: 299 10*3/uL (ref 140–400)
RBC: 4.12 10*6/uL — ABNORMAL LOW (ref 4.70–6.00)
RDW: 15 % (ref 12–15)
WBC: 19.29 10*3/uL — ABNORMAL HIGH (ref 3.50–10.80)

## 2016-12-14 LAB — URINALYSIS
Bilirubin, UA: NEGATIVE
Blood, UA: NEGATIVE
Glucose, UA: NEGATIVE
Ketones UA: NEGATIVE
Nitrite, UA: NEGATIVE
Specific Gravity UA: 1.018 (ref 1.001–1.035)
Urine pH: 7 (ref 5.0–8.0)
Urobilinogen, UA: 0.2

## 2016-12-14 LAB — LACTIC ACID, PLASMA
Lactic Acid: 1.4 mmol/L (ref 0.2–2.0)
Lactic Acid: 1.5 mmol/L (ref 0.2–2.0)

## 2016-12-14 LAB — URINALYSIS, REFLEX TO MICROSCOPIC EXAM IF INDICATED
Bilirubin, UA: NEGATIVE
Blood, UA: NEGATIVE
Glucose, UA: NEGATIVE
Ketones UA: NEGATIVE
Nitrite, UA: NEGATIVE
Protein, UR: NEGATIVE
Specific Gravity UA: 1.014 (ref 1.001–1.035)
Urine pH: 5 (ref 5.0–8.0)
Urobilinogen, UA: NEGATIVE mg/dL

## 2016-12-14 LAB — TROPONIN I
Troponin I: <0.01 ng/mL (ref 0.00–0.09)
Troponin I: <0.01 ng/mL (ref 0.00–0.09)

## 2016-12-14 LAB — COMPREHENSIVE METABOLIC PANEL
ALT: 17 U/L (ref 0–55)
AST (SGOT): 16 U/L (ref 5–34)
Albumin/Globulin Ratio: 0.6 — ABNORMAL LOW (ref 0.9–2.2)
Albumin: 2.1 g/dL — ABNORMAL LOW (ref 3.5–5.0)
Alkaline Phosphatase: 65 U/L (ref 38–106)
Anion Gap: 9 (ref 5.0–15.0)
BUN: 30 mg/dL — ABNORMAL HIGH (ref 9.0–28.0)
Bilirubin, Total: 0.5 mg/dL (ref 0.2–1.2)
CO2: 42 mEq/L — ABNORMAL HIGH (ref 22–29)
Calcium: 8.6 mg/dL (ref 7.9–10.2)
Chloride: 87 mEq/L — ABNORMAL LOW (ref 100–111)
Creatinine: 0.8 mg/dL (ref 0.7–1.3)
Globulin: 3.7 g/dL — ABNORMAL HIGH (ref 2.0–3.6)
Glucose: 119 mg/dL — ABNORMAL HIGH (ref 70–100)
Potassium: 5.1 mEq/L (ref 3.5–5.1)
Protein, Total: 5.8 g/dL — ABNORMAL LOW (ref 6.0–8.3)
Sodium: 138 mEq/L (ref 136–145)

## 2016-12-14 LAB — URINE MICROSCOPIC

## 2016-12-14 LAB — GFR: EGFR: >60

## 2016-12-14 LAB — PT/INR
PT INR: 1.3 — ABNORMAL HIGH (ref 0.9–1.1)
PT: 16.2 s — ABNORMAL HIGH (ref 12.6–15.0)

## 2016-12-14 LAB — PHOSPHORUS: Phosphorus: 3.7 mg/dL (ref 2.3–4.7)

## 2016-12-14 LAB — GLUCOSE WHOLE BLOOD - POCT
Whole Blood Glucose POCT: 100 mg/dL (ref 70–100)
Whole Blood Glucose POCT: 107 mg/dL — ABNORMAL HIGH (ref 70–100)

## 2016-12-14 LAB — APTT: PTT: 37 s (ref 23–37)

## 2016-12-14 LAB — HEMOLYSIS INDEX
Hemolysis Index: 2 (ref 0–18)
Hemolysis Index: 6 (ref 0–18)

## 2016-12-14 LAB — CALCIUM, IONIZED: Calcium, Ionized: 2.29 mEq/L — ABNORMAL LOW (ref 2.30–2.58)

## 2016-12-14 LAB — MAGNESIUM: Magnesium: 2 mg/dL (ref 1.6–2.6)

## 2016-12-14 LAB — TSH: TSH: 6.16 u[IU]/mL — ABNORMAL HIGH (ref 0.35–4.94)

## 2016-12-14 MED ORDER — FENTANYL CITRATE (PF) 50 MCG/ML IJ SOLN (WRAP)
50.0000 ug | INTRAMUSCULAR | Status: AC | PRN
Start: 2016-12-14 — End: 2016-12-14
  Filled 2016-12-14: qty 2

## 2016-12-14 MED ORDER — SODIUM PHOSPHATES 3 MMOLE/ML IV SOLN (WRAP)
25.0000 mmol | INTRAVENOUS | Status: DC | PRN
Start: 2016-12-14 — End: 2016-12-16
  Filled 2016-12-14: qty 8.33

## 2016-12-14 MED ORDER — GLYCOPYRROLATE 1 MG PO TABS
1.0000 mg | ORAL_TABLET | Freq: Every day | ORAL | Status: DC
Start: 2016-12-14 — End: 2016-12-27
  Administered 2016-12-14 – 2016-12-27 (×14): 1 mg via ORAL
  Filled 2016-12-14 (×15): qty 1

## 2016-12-14 MED ORDER — AMIODARONE HCL 200 MG PO TABS
200.0000 mg | ORAL_TABLET | Freq: Every day | ORAL | Status: DC
Start: 2016-12-14 — End: 2016-12-14

## 2016-12-14 MED ORDER — GLUCOSE 40 % PO GEL
15.0000 g | ORAL | Status: DC | PRN
Start: 2016-12-14 — End: 2016-12-27

## 2016-12-14 MED ORDER — METOCLOPRAMIDE HCL 10 MG PO TABS
5.0000 mg | ORAL_TABLET | Freq: Three times a day (TID) | ORAL | Status: DC
Start: 2016-12-14 — End: 2016-12-24
  Administered 2016-12-14 – 2016-12-24 (×24): 5 mg via ORAL
  Filled 2016-12-14 (×11): qty 1
  Filled 2016-12-14: qty 2
  Filled 2016-12-14 (×5): qty 1
  Filled 2016-12-14: qty 2
  Filled 2016-12-14 (×3): qty 1

## 2016-12-14 MED ORDER — CIPROFLOXACIN HCL 0.3 % OP SOLN
1.0000 [drp] | OPHTHALMIC | Status: DC
Start: 2016-12-14 — End: 2016-12-23
  Administered 2016-12-14 – 2016-12-23 (×67): 1 [drp] via OPHTHALMIC
  Filled 2016-12-14 (×2): qty 2.5

## 2016-12-14 MED ORDER — SODIUM PHOSPHATES 3 MMOLE/ML IV SOLN (WRAP)
15.0000 mmol | INTRAVENOUS | Status: DC | PRN
Start: 2016-12-14 — End: 2016-12-16
  Filled 2016-12-14: qty 5

## 2016-12-14 MED ORDER — ACETAMINOPHEN 650 MG RE SUPP
650.0000 mg | Freq: Four times a day (QID) | RECTAL | Status: DC | PRN
Start: 2016-12-14 — End: 2016-12-27

## 2016-12-14 MED ORDER — SENNOSIDES 8.8MG/5ML UNIT DOSE SYRINGE
8.8000 mg | ORAL_SOLUTION | Freq: Every evening | ORAL | Status: DC
Start: 2016-12-14 — End: 2016-12-17
  Administered 2016-12-14 – 2016-12-16 (×3): 8.8 mg via ORAL
  Filled 2016-12-14 (×4): qty 5

## 2016-12-14 MED ORDER — SODIUM CHLORIDE 0.9 % IJ SOLN
Freq: Three times a day (TID) | INTRAMUSCULAR | Status: DC
Start: 2016-12-14 — End: 2016-12-15
  Filled 2016-12-14 (×4): qty 2

## 2016-12-14 MED ORDER — SODIUM CHLORIDE 0.9 % IV BOLUS
1200.0000 mL | Freq: Once | INTRAVENOUS | Status: AC
Start: 2016-12-14 — End: 2016-12-14
  Administered 2016-12-14: 1200 mL via INTRAVENOUS

## 2016-12-14 MED ORDER — DEXTROSE 50 % IV SOLN
12.5000 g | INTRAVENOUS | Status: DC | PRN
Start: 2016-12-14 — End: 2016-12-27

## 2016-12-14 MED ORDER — MIRTAZAPINE 15 MG PO TABS
15.0000 mg | ORAL_TABLET | Freq: Every evening | ORAL | Status: DC
Start: 2016-12-14 — End: 2016-12-27
  Administered 2016-12-14 – 2016-12-26 (×13): 15 mg via ORAL
  Filled 2016-12-14 (×13): qty 1

## 2016-12-14 MED ORDER — SODIUM CHLORIDE 0.9 % IV MBP
4.5000 g | Freq: Once | INTRAVENOUS | Status: AC
Start: 2016-12-14 — End: 2016-12-14
  Administered 2016-12-14: 4.5 g via INTRAVENOUS
  Filled 2016-12-14: qty 100
  Filled 2016-12-14: qty 20

## 2016-12-14 MED ORDER — MAGNESIUM SULFATE IN D5W 1-5 GM/100ML-% IV SOLN
1.0000 g | INTRAVENOUS | Status: DC | PRN
Start: 2016-12-14 — End: 2016-12-16
  Administered 2016-12-16 (×2): 1 g via INTRAVENOUS
  Filled 2016-12-14: qty 100

## 2016-12-14 MED ORDER — SODIUM PHOSPHATES 3 MMOLE/ML IV SOLN (WRAP)
35.0000 mmol | INTRAVENOUS | Status: DC | PRN
Start: 2016-12-14 — End: 2016-12-16
  Filled 2016-12-14: qty 11.67

## 2016-12-14 MED ORDER — VANCOMYCIN HCL 1000 MG IV SOLR
2000.0000 mg | Freq: Once | INTRAVENOUS | Status: AC
Start: 2016-12-14 — End: 2016-12-14
  Administered 2016-12-14: 2000 mg via INTRAVENOUS
  Filled 2016-12-14: qty 2000

## 2016-12-14 MED ORDER — SODIUM CHLORIDE 0.9 % IV BOLUS
1000.0000 mL | Freq: Once | INTRAVENOUS | Status: AC
Start: 2016-12-14 — End: 2016-12-14
  Administered 2016-12-14: 1000 mL via INTRAVENOUS

## 2016-12-14 MED ORDER — FAMOTIDINE 20 MG PO TABS
20.0000 mg | ORAL_TABLET | Freq: Two times a day (BID) | ORAL | Status: DC
Start: 2016-12-14 — End: 2016-12-15
  Administered 2016-12-14 – 2016-12-15 (×2): 20 mg via ORAL
  Filled 2016-12-14 (×2): qty 1

## 2016-12-14 MED ORDER — SODIUM CHLORIDE 0.9 % IV SOLN
1.0000 g | INTRAVENOUS | Status: DC | PRN
Start: 2016-12-14 — End: 2016-12-16

## 2016-12-14 MED ORDER — METOPROLOL TARTRATE 25 MG PO TABS
25.0000 mg | ORAL_TABLET | Freq: Two times a day (BID) | ORAL | Status: DC
Start: 2016-12-14 — End: 2016-12-16
  Administered 2016-12-14 – 2016-12-15 (×2): 25 mg via ORAL
  Filled 2016-12-14 (×2): qty 1

## 2016-12-14 MED ORDER — SODIUM CHLORIDE 0.9 % IV BOLUS
1000.0000 mL | Freq: Once | INTRAVENOUS | Status: DC
Start: 2016-12-14 — End: 2016-12-14

## 2016-12-14 MED ORDER — ALBUTEROL-IPRATROPIUM 2.5-0.5 (3) MG/3ML IN SOLN
3.0000 mL | RESPIRATORY_TRACT | Status: DC | PRN
Start: 2016-12-14 — End: 2016-12-27
  Administered 2016-12-14 – 2016-12-26 (×11): 3 mL via RESPIRATORY_TRACT
  Filled 2016-12-14 (×12): qty 3

## 2016-12-14 MED ORDER — PROPOFOL 10 MG/ML IV EMUL (WRAP)
INTRAVENOUS | Status: AC
Start: 2016-12-14 — End: 2016-12-14
  Administered 2016-12-14: 20 mg
  Filled 2016-12-14: qty 50

## 2016-12-14 MED ORDER — PROPOFOL INFUSION 10 MG/ML
5.0000 ug/kg/min | INTRAVENOUS | Status: DC
Start: 2016-12-14 — End: 2016-12-17
  Administered 2016-12-14 – 2016-12-16 (×8): 20 ug/kg/min via INTRAVENOUS
  Administered 2016-12-16: 15 ug/kg/min via INTRAVENOUS
  Administered 2016-12-16: 20 ug/kg/min via INTRAVENOUS
  Administered 2016-12-16 – 2016-12-17 (×4): 15 ug/kg/min via INTRAVENOUS
  Filled 2016-12-14 (×13): qty 50

## 2016-12-14 MED ORDER — ACETAMINOPHEN 160 MG/5ML PO SUSP
650.0000 mg | Freq: Once | ORAL | Status: AC
Start: 2016-12-14 — End: 2016-12-14
  Administered 2016-12-14: 650 mg via GASTROSTOMY
  Filled 2016-12-14: qty 25

## 2016-12-14 MED ORDER — PROPOFOL 10 MG/ML IV EMUL (WRAP)
INTRAVENOUS | Status: AC
Start: 2016-12-14 — End: 2016-12-15
  Filled 2016-12-14: qty 50

## 2016-12-14 MED ORDER — ALBUTEROL-IPRATROPIUM 2.5-0.5 (3) MG/3ML IN SOLN
3.0000 mL | Freq: Four times a day (QID) | RESPIRATORY_TRACT | Status: DC
Start: 2016-12-14 — End: 2016-12-14

## 2016-12-14 MED ORDER — FENTANYL CITRATE (PF) 50 MCG/ML IJ SOLN (WRAP)
50.0000 ug | INTRAMUSCULAR | Status: DC | PRN
Start: 2016-12-14 — End: 2016-12-18
  Administered 2016-12-14: 50 ug via INTRAVENOUS
  Filled 2016-12-14: qty 2

## 2016-12-14 MED ORDER — BUDESONIDE 0.5 MG/2ML IN SUSP
0.5000 mg | Freq: Two times a day (BID) | RESPIRATORY_TRACT | Status: DC
Start: 2016-12-14 — End: 2016-12-27
  Administered 2016-12-15 – 2016-12-27 (×25): 0.5 mg via RESPIRATORY_TRACT
  Filled 2016-12-14 (×23): qty 2

## 2016-12-14 MED ORDER — RIVAROXABAN 20 MG PO TABS
20.0000 mg | ORAL_TABLET | Freq: Every day | ORAL | Status: DC
Start: 2016-12-14 — End: 2016-12-15
  Administered 2016-12-14: 20 mg via ORAL
  Filled 2016-12-14 (×3): qty 1

## 2016-12-14 MED ORDER — ATORVASTATIN CALCIUM 10 MG PO TABS
10.0000 mg | ORAL_TABLET | Freq: Every evening | ORAL | Status: DC
Start: 2016-12-14 — End: 2016-12-14

## 2016-12-14 MED ORDER — AMLODIPINE BESYLATE 5 MG PO TABS
5.0000 mg | ORAL_TABLET | Freq: Every day | ORAL | Status: DC
Start: 2016-12-14 — End: 2016-12-16
  Administered 2016-12-14: 5 mg via ORAL
  Filled 2016-12-14: qty 1

## 2016-12-14 MED ORDER — POTASSIUM CHLORIDE 10 MEQ/100ML IV SOLN (WRAP)
10.0000 meq | INTRAVENOUS | Status: DC | PRN
Start: 2016-12-14 — End: 2016-12-16
  Administered 2016-12-16 (×4): 10 meq via INTRAVENOUS
  Filled 2016-12-14 (×4): qty 100

## 2016-12-14 MED ORDER — SODIUM CHLORIDE 0.9 % IJ SOLN
Freq: Three times a day (TID) | INTRAMUSCULAR | Status: DC
Start: 2016-12-14 — End: 2016-12-14

## 2016-12-14 MED ORDER — SODIUM CHLORIDE 0.9 % IJ SOLN
3.0000 mL | Freq: Three times a day (TID) | INTRAMUSCULAR | Status: DC
Start: 2016-12-14 — End: 2016-12-27
  Administered 2016-12-14 – 2016-12-27 (×32): 3 mL via INTRAVENOUS

## 2016-12-14 MED ORDER — TAB-A-VITE/BETA CAROTENE PO TABS
1.0000 | ORAL_TABLET | Freq: Every day | ORAL | Status: DC
Start: 2016-12-14 — End: 2016-12-27
  Administered 2016-12-14 – 2016-12-27 (×13): 1 via ORAL
  Filled 2016-12-14 (×14): qty 1

## 2016-12-14 MED ORDER — GLUCAGON 1 MG IJ SOLR (WRAP)
1.0000 mg | INTRAMUSCULAR | Status: DC | PRN
Start: 2016-12-14 — End: 2016-12-27

## 2016-12-14 MED ORDER — AMIODARONE HCL 200 MG PO TABS
200.0000 mg | ORAL_TABLET | Freq: Every day | ORAL | Status: DC
Start: 2016-12-14 — End: 2016-12-23
  Administered 2016-12-14 – 2016-12-23 (×10): 200 mg via ORAL
  Filled 2016-12-14 (×10): qty 1

## 2016-12-14 MED ORDER — ONDANSETRON HCL 4 MG/2ML IJ SOLN
4.0000 mg | Freq: Three times a day (TID) | INTRAMUSCULAR | Status: DC | PRN
Start: 2016-12-14 — End: 2016-12-27

## 2016-12-14 MED ORDER — FENTANYL CITRATE-NACL 1-0.9 MG/100ML-% IV SOLN
12.5000 ug/h | INTRAVENOUS | Status: DC
Start: 2016-12-14 — End: 2016-12-17
  Administered 2016-12-14 – 2016-12-15 (×2): 50 ug/h via INTRAVENOUS
  Administered 2016-12-16 (×2): 40 ug/h via INTRAVENOUS
  Administered 2016-12-17: 50 ug/h via INTRAVENOUS
  Filled 2016-12-14 (×4): qty 100

## 2016-12-14 MED ORDER — SODIUM CHLORIDE 0.9 % IV SOLN
INTRAVENOUS | Status: DC
Start: 2016-12-14 — End: 2016-12-16

## 2016-12-14 MED ORDER — ATORVASTATIN CALCIUM 20 MG PO TABS
20.0000 mg | ORAL_TABLET | Freq: Every evening | ORAL | Status: DC
Start: 2016-12-14 — End: 2016-12-27
  Administered 2016-12-14 – 2016-12-26 (×13): 20 mg via ORAL
  Filled 2016-12-14 (×13): qty 1

## 2016-12-14 NOTE — Progress Notes (Signed)
Tracheostomy changed to cuffed trach by RT with anesthesia and on-call intensivist aware. Initiate MV.  Discussed clinical management and POC for central line and arterial line placement with verbal consent received with patient's daughter and POA.

## 2016-12-14 NOTE — Progress Notes (Signed)
Pt arrived from ED, responding to painful stimuli only.  Jerking motion noted.  NP made aware.  Labs drawn, IV boluses given as ordered.  Pocket talker provided for patient  per ED nurse report of pt being [facility].  Stage 2 pressure ulcer noted on pt sacrum, ulcer cleaned and cover with dressing.  Pt continued to desat despite suctioning and resp interventions.  NP made aware.  Pt changed from trach collar to ventilator.

## 2016-12-14 NOTE — Consults (Addendum)
Nutritional Support Services  Nutrition Assessment    Karin Griffith 83 y.o. male   MRN: [id number]    Summary of Nutrition Recommendations:  1. If patient is hemodynamically stable, initiate Vital High Protein 1.0 at 20 ml/hour and advance by 10 ml Q 4 hours to goal infusion rate of 50 ml/hour    Provides 1200 kcals, 105 gm protein, 1003 ml free water in 1200 ml total volume     2. Provide 1 packet of ProSource three times daily    EN + Prosource provides 1380 kcals and 150 gm protein    Provides 100% of estimated kcal needs and 100% of estimated protein needs     3. Flush tube with minimum of 30-50 ml Q 4 hours for tube patency- additional fluids for hydration per MD    Recommendations discussed with RN.  -----------------------------------------------------------------------------------------------------------------                                                      Assessment Data:   Referral Source: MD Consult  Reason for Referral: PEG Tube Dependent Feeding     Nutrition: RDN spoke with [name] RN Adelalde, RN reports patient receives Isosource 1.5 @ 55 cc/hour x 20 hours (provides 1650 kcals, 75 gm protein, 840 ml free water in 1100 ml total volume; protein powder 2 scoops/BID; arginiaid 1 packet BID. 175 cc Q 4 hours for water flushes. RDN to submit recommendations of ICU kcal/protien criteria.   RDN spoke with NP Irving Burton; okay to place orders. NP plans on submitting wound consult order to culture PEG tube before feeding. RDN will place order for EN; likely to start EN tomorrow.   Hospital Admission: admitted with healthcare associated PNA, UTI, AMS, sepsis, acute on chronic respiratory failure, a-fib, HTN; palliative care consult has been placed   Medical Hx:  has a past medical history of C2 cervical fracture; Chronic pain; Conjunctivitis; Constipation; Cutaneous abscess of buttock; Dysphagia; Hypertension; Insomnia; Kidney disorder; and Respiratory arrest.  PSH: has a past surgical history that  includes Tracheostomy and Gastrostomy tube placement.   Social Hx: never smoker; nondrinker; from [place]     Orders Placed This Encounter   Procedures   . Diet NPO effective now     Enteral: PEG dependent   Indication: Home TF     ANTHROPOMETRIC  Anthropometrics  Height: 177.8 cm (5\' 10" )  Weight: 106.6 kg (235 lb)  Weight Change: 0  IBW/kg (Calculated) Male: 75.48 kg  IBW/kg (Calculated) Male: 68.16 kg  BMI (calculated): 33.8 (obese grade I)     Weight Monitoring 12/14/2016   Height 177.8 cm   Height Method Estimated   Weight 106.595 kg   BMI (calculated) 33.8 kg/m2     Wt Readings from Last 10 Encounters:   12/14/16 106.6 kg (235 lb)   Weight History Summary: no weight hx on file; patient unable to provide     Physical Assessment:   Head: obese appearing   Upper Body: obese appearing   Lower Body: obese appearing   Edema: none noted per doc flow   Skin: WDL per doc flow   GI function: WDL per doc flow     ESTIMATED NEEDS  Estimated Energy Needs  Total Energy Estimated Needs: 1175-1500 kcals  Method for Estimating Needs: 11-14 kcal/kg using 107 kg (obese; ICU)  Estimated Protein Needs  Total Protein Estimated Needs: 150-188 gm  Method for Estimating Needs: 2.0-2.5 gm/kg using IBW 75 kg (obese; ICU)    Fluid Needs  Total Fluid Estimated Needs: 1 ml/kcal or per MD     Pertinent Medications: lipitor, famotidine, metoprolol, reglan, MVI, senna  IVF:    . sodium chloride     Pertinent labs: Glucose: 119, BUN: 30.0, GFR: >60.0, BNP: 143  Learning Needs: patient not appropriate for education-trache and AMS                                                               Nutrition Diagnosis      Inadequate energy intake related to respiratory failure as evidenced by interruption in PEG tube feedings upon transfer from [name] to [facility].                                                               Intervention   Nutrition recommendation -   1. If patient is hemodynamically stable, initiate Vital High Protein 1.0 at 20  ml/hour and advance by 10 ml Q 4 hours to goal infusion rate of 50 ml/hour    Provides 1200 kcals, 105 gm protein, 1003 ml free water in 1200 ml total volume     2. Provide 1 packet of ProSource three times daily    EN + Prosource provides 1380 kcals and 150 gm protein    Provides 100% of estimated kcal needs and 100% of estimated protein needs     3. Flush tube with minimum of 30-50 ml Q 4 hours for tube patency- additional fluids for hydration per MD    Goal: EN initiation within 24 hours                                                              Monitoring     EN initiation and tolerance, weight, GI fxn, labs                                                         Evaluation   Nutrition Risk Level: High (will follow up within 5 days and PRN)     Carmelia Roller, RDN, CNSC   Office [phone]   [employer]ink [phone]

## 2016-12-14 NOTE — ED Provider Notes (Addendum)
EMERGENCY DEPARTMENT HISTORY AND PHYSICAL EXAM     Physician/Midlevel provider first contact with patient: 12/14/16 1144         Date: 12/14/2016  Patient Name: [name]      Provider Assessment       Provider Assessment: 83 y.o. male with AMS, fever, likely due to right sided pneumonia. Started broad spectrum antibiotics.  Initially appeared to be on respiratory distress, improved with suctioning. Placed on vent for a short period of time but able to be taken off again.  Will likely require more ventilatory support in the future, place in ICU. A fib with RVR but rate in 110-120: this is likely compensitory from sepsis so will not lower at this time. Currently normotensive, given IV fluids, normal lactic acid.      History of Presenting Illness     History Provided By: Patient's Daughter and EMS    Preferred Language: English     Unable to obtain complete HPI, PMFSH, and ROS due to AMS.    Chief Complaint: AMS  Onset: Today, PTA   Timing: Constant   Location: Generalized  Quality: "confused"  Severity: Moderate   Modifying Factors: 10 L of O2 with mild relief   Associated Symptoms: cough, congestion     Additional History: [name] is a 83 y.o. male presenting to the ED BIBA from [place] with c/o AMS. The patient was in his usual state of health until today, PTA. Per EMS, the pt was "agitated and confused" at [place]. Per the pts Son, the pt was acting disoriented but notes he had a harder physical therapy session. Pt arrived with 83% O2 STAT on 10 L. The pt is on 4 L of O2 at home. Pts daughter notes the pt is on antibiotics for recent bronchitis. Pts daughter also notes the pt was in the ICU last year after breaking 3 bones in his neck and had his tracheostomy placed. The pt was taken off the ventilator on 10/09/2016.     PCP: [name], MD      Current Facility-Administered Medications   Medication Dose Route Frequency Provider Last Rate Last Dose   . 0.9%  NaCl infusion   Intravenous  Continuous Neigh, Emily L, NP       . acetaminophen (TYLENOL) suppository 650 mg  650 mg Rectal Q6H PRN Neigh, Emily L, NP       . acetylcysteine (MUCOMYST) nebulizer solution 10%   Nebulization Q8H SCH Nayyar, Rashid, MD       . albuterol-ipratropium (DUO-NEB) 2.5-0.5(3) mg/3 mL nebulizer 3 mL  3 mL Nebulization Q4H PRN Neigh, Emily L, NP   3 mL at 12/14/16 1713   . amiodarone (PACERONE) tablet 200 mg  200 mg Oral Daily Neigh, Emily L, NP   200 mg at 12/14/16 1657   . amLODIPine (NORVASC) tablet 5 mg  5 mg Oral Daily Neigh, Emily L, NP   5 mg at 12/14/16 1657   . atorvastatin (LIPITOR) tablet 20 mg  20 mg Oral QHS Neigh, Emily L, NP       . budesonide (PULMICORT) 0.5 MG/2ML nebulizer solution 0.5 mg  0.5 mg Nebulization BID Neigh, Emily L, NP       . calcium GLUConate 1 g in sodium chloride 0.9 % 100 mL IVPB  1 g Intravenous PRN Neigh, Emily L, NP       . ciprofloxacin (CILOXAN) 0.3 % ophthalmic solution 1 drop  1 drop Right Eye Q2H  WA Neigh, Emily L, NP       . dextrose (GLUCOSE) 40 % oral gel 15 g of glucose  15 g of glucose Oral PRN Neigh, Emily L, NP        And   . dextrose 50 % bolus 12.5 g  12.5 g Intravenous PRN Neigh, Emily L, NP        And   . glucagon (rDNA) (GLUCAGEN) injection 1 mg  1 mg Intramuscular PRN Neigh, Emily L, NP       . famotidine (PEPCID) tablet 20 mg  20 mg Oral Q12H SCH Neigh, Emily L, NP       . fentaNYL (PF) (SUBLIMAZE) injection 50 mcg  50 mcg Intravenous Q1H PRN Neigh, Emily L, NP       . glycopyrrolate (ROBINUL) tablet 1 mg  1 mg Oral Daily Neigh, Emily L, NP       . magnesium sulfate 1g in dextrose 5% IVPB (premix)  1 g Intravenous PRN Neigh, Emily L, NP       . metoclopramide (REGLAN) tablet 5 mg  5 mg Oral TID AC Neigh, Emily L, NP   5 mg at 12/14/16 1656   . metoprolol tartrate (LOPRESSOR) tablet 25 mg  25 mg Oral Q12H SCH Neigh, Emily L, NP   25 mg at 12/14/16 1656   . mirtazapine (REMERON) tablet 15 mg  15 mg Oral QHS Neigh, Emily L, NP       . multivitamin (MULTIVITAMIN)  tablet 1 tablet  1 tablet Oral Daily Neigh, Emily L, NP   1 tablet at 12/14/16 1657   . ondansetron (ZOFRAN) injection 4 mg  4 mg Intravenous Q8H PRN Neigh, Emily L, NP       . potassium chloride 10 mEq in 100 mL IVPB (premix)  10 mEq Intravenous PRN Neigh, Arville Care, NP       . rivaroxaban (XARELTO) tablet 20 mg  20 mg Oral Daily with dinner Neigh, Emily L, NP       . sennosides (SENOKOT) syrup 8.8 mg  8.8 mg Oral QHS Neigh, Emily L, NP       . sodium chloride (PF) 0.9 % flush 3 mL  3 mL Intravenous Q8H Neigh, Emily L, NP       . sodium chloride 0.9 % bolus 1,200 mL  1,200 mL Intravenous Once Alverda Skeans L, NP 600 mL/hr at 12/14/16 1656 1,200 mL at 12/14/16 1656   . sodium phosphate 15 mmol in dextrose 5 % 250 mL IVPB  15 mmol Intravenous PRN Neigh, Emily L, NP       . sodium phosphate 25 mmol in dextrose 5 % 250 mL IVPB  25 mmol Intravenous PRN Neigh, Emily L, NP       . sodium phosphate 35 mmol in dextrose 5 % 250 mL IVPB  35 mmol Intravenous PRN Neigh, Arville Care, NP           Past History     Past Medical History:  Past Medical History:   Diagnosis Date   . C2 cervical fracture    . Chronic pain    . Conjunctivitis    . Constipation    . Cutaneous abscess of buttock    . Dysphagia    . Hypertension    . Insomnia    . Kidney disorder    . Respiratory arrest        Past Surgical History:  Past Surgical History:   Procedure Laterality  Date   . GASTROSTOMY TUBE PLACEMENT     . TRACHEOSTOMY         Family History:  History reviewed. No pertinent family history.    Social History:  Social History   Substance Use Topics   . Smoking status: Never Smoker   . Smokeless tobacco: Never Used   . Alcohol use No       Allergies:  Allergies   Allergen Reactions   . Dilaudid [Hydromorphone Hcl]        Review of Systems     Review of Systems   Unable to perform ROS: Mental status change   Constitutional: Positive for fever.   HENT: Positive for congestion.    Eyes: Positive for redness.   Respiratory: Positive for cough.     Gastrointestinal: Negative for vomiting.   Allergic/Immunologic:        Allergic to Dilaudid    Psychiatric/Behavioral: Positive for confusion.     Physical Exam   BP 100/53   Pulse (!) 104   Temp 99.3 F (37.4 C) (Axillary)   Resp (!) 45   Ht 5\' 10"  (1.778 m)   Wt 102.3 kg   SpO2 95%   BMI 32.36 kg/m     Physical Examination:    General appearance - Chronically ill appearing  Eyes - Pupils equal, extraocular eye movements grossly intact, right eye with mild scleral injection  ENT - Dry mucous membranes, OP normal  Neck - Supple, grossly normal ROM, trach in place   Heart - Tachycardic, irregular  Chest - Increased work of breathing, wheezing breath sounds with rhonchi, diminished on the right  Abdomen - Soft, nontender, non-distended, G tube without purulence   Neurological - Alert, moves all extremities, cranial nerves grossly normal  Psychiatric - waving hands, seeming to know what is being said to him, when given paper to write, he is unable to write, seems confused, currently unable to effectively communicate or follow commands  Musculoskeletal - No deformity or tenderness  Extremities - Trace pedal edema, distal blood flow grossly normal  Skin - Normal coloration, no rashes where visualized     Diagnostic Study Results     Labs -     Results     Procedure Component Value Units Date/Time    Blood Culture Aerobic/Anaerobic #2 [[id number]] Collected:  12/14/16 1318    Specimen:  Arm from Blood Updated:  12/14/16 1704    Narrative:       1 BLUE+1 PURPLE    Troponin I [[id number]] Collected:  12/14/16 1552    Specimen:  Blood Updated:  12/14/16 1638     Troponin I <0.01 ng/mL     APTT [[id number]] Collected:  12/14/16 1552     Updated:  12/14/16 1630     PTT 37 sec     Narrative:       If not done within 48 hours.    Protime-INR [[id number]]  (Abnormal) Collected:  12/14/16 1552    Specimen:  Blood Updated:  12/14/16 1630     PT 16.2 (H) sec      PT INR 1.3 (H)     PT Anticoag. Given Within 48 hrs.  rivaroxaban (Xa    Narrative:       If not done within 48 hours.    Blood gas, arterial [[id number]]  (Abnormal) Collected:  12/14/16 1620    Specimen:  Blood, Arterial Updated:  12/14/16 1625     pH, Arterial 7.332 (L)  pCO2, Arterial 78.3 (HH) mmHg      pO2, Arterial 69.1 (L) mmHg      HCO3, Arterial 40.3 (H) mEq/L      Arterial Total CO2 36.9 (H) mEq/L      Base Excess, Arterial 11.6 (H) mEq/L      O2 Sat, Arterial 94.2 (L) %      ABG CollectionSite Right Radl     Allen's Test Yes     Temperature 37.0     FIO2 40 %      O2 Delivery T-Piece     O2 Flow 10.0 L/min     Sputum Culture [[id number]] Collected:  12/14/16 1206    Specimen:  Sputum from Tracheal Aspirate Updated:  12/14/16 1616    Calcium, ionized [[id number]]  (Abnormal) Collected:  12/14/16 1546    Specimen:  Blood Updated:  12/14/16 1606     Calcium, Ionized 2.29 (L) mEq/L     Glucose Whole Blood - POCT [[id number]] Collected:  12/14/16 1557     Updated:  12/14/16 1604     POCT - Glucose Whole blood 100 mg/dL     MRSA Culture [[id number]] Collected:  12/14/16 1208    Specimen:  Body Fluid from Nares and Throat Updated:  12/14/16 1539    TSH [[id number]]  (Abnormal) Collected:  12/14/16 1207    Specimen:  Blood Updated:  12/14/16 1526     Thyroid Stimulating Hormone 6.16 (H) uIU/mL     Magnesium [[id number]] Collected:  12/14/16 1207    Specimen:  Blood Updated:  12/14/16 1504     Magnesium 2.0 mg/dL     Phosphorus [[id number]] Collected:  12/14/16 1207    Specimen:  Blood Updated:  12/14/16 1504     Phosphorus 3.7 mg/dL     Hemolysis index [[id number]] Collected:  12/14/16 1207     Updated:  12/14/16 1504     Hemolysis Index 6    Blood Culture Aerobic/Anaerobic #1 [[id number]] Collected:  12/14/16 1207    Specimen:  Arm from Blood Updated:  12/14/16 1418    Narrative:       1 BLUE+1 PURPLE    UA, Reflex to Microscopic (pts 3 + yrs) [[id number]]  (Abnormal) Collected:  12/14/16 1315    Specimen:  Urine Updated:  12/14/16 1347     Urine Type Clean Catch      Color, UA Yellow     Clarity, UA Hazy     Specific Gravity UA 1.014     Urine pH 5.0     Leukocyte Esterase, UA Moderate (A)     Nitrite, UA Negative     Protein, UR Negative     Glucose, UA Negative     Ketones UA Negative     Urobilinogen, UA Negative mg/dL      Bilirubin, UA Negative     Blood, UA Negative     RBC, UA 0 - 2 /hpf      WBC, UA 11 - 25 (A) /hpf      Hyaline Casts, UA 0 - 3 /lpf      Urine Mucus Present    Urine culture [[id number]] Collected:  12/14/16 1315    Specimen:  Urine from Urine, Catheterized, In & Out Updated:  12/14/16 1336    Arterial Blood Gas (ABG) [[id number]]  (Abnormal) Collected:  12/14/16 1322    Specimen:  Blood, Arterial Updated:  12/14/16 1322     pH, Arterial 7.427     pCO2, Arterial 63.2 (HH) mmHg  pO2, Arterial 169.0 (H) mmHg      HCO3, Arterial 40.9 (H) mEq/L      Arterial Total CO2 37.1 (H) mEq/L      Base Excess, Arterial 14.2 (H) mEq/L      O2 Sat, Arterial 100.0 %      ABG CollectionSite Right Radl     Allen's Test Yes     Temperature 37.0     FIO2 50 %      O2 Delivery Ventilator     Rate 14 BPM      Mode: ac     PEEP 5     Tidal vol. 400    Lactic acid, plasma [[id number]] Collected:  12/14/16 1322     Updated:  12/14/16 1322     Lactic acid 1.4 mmol/L     Troponin I [[id number]] Collected:  12/14/16 1207    Specimen:  Blood Updated:  12/14/16 1241     Troponin I <0.01 ng/mL     Comprehensive metabolic panel [[id number]]  (Abnormal) Collected:  12/14/16 1207    Specimen:  Blood Updated:  12/14/16 1238     Glucose 119 (H) mg/dL      BUN 08.6 (H) mg/dL      Creatinine 0.8 mg/dL      Sodium 578 mEq/L      Potassium 5.1 mEq/L      Chloride 87 (L) mEq/L      CO2 42 (H) mEq/L      Calcium 8.6 mg/dL      Protein, Total 5.8 (L) g/dL      Albumin 2.1 (L) g/dL      AST (SGOT) 16 U/L      ALT 17 U/L      Alkaline Phosphatase 65 U/L      Bilirubin, Total 0.5 mg/dL      Globulin 3.7 (H) g/dL      Albumin/Globulin Ratio 0.6 (L)     Anion Gap 9.0    Hemolysis index [[id number]]  Collected:  12/14/16 1207     Updated:  12/14/16 1230     Hemolysis Index 2    GFR [[id number]] Collected:  12/14/16 1207     Updated:  12/14/16 1230     EGFR >60.0    Rapid influenza A/B antigens [[id number]] Collected:  12/14/16 1207    Specimen:  Nasopharyngeal from Nasal Aspirate Updated:  12/14/16 1230    Narrative:       ORDER#: [id number]                                    ORDERED BY: Maxyne Derocher, MICHA  SOURCE: Nasal Aspirate                               COLLECTED:  12/14/16 12:07  ANTIBIOTICS AT COLL.:                                RECEIVED :  12/14/16 12:14  Influenza Rapid Antigen A&B                FINAL       12/14/16 12:29  12/14/16   Negative for Influenza A and B             Reference Range: Negative      CBC  with differential [[id number]]  (Abnormal) Collected:  12/14/16 1207    Specimen:  Blood from Blood Updated:  12/14/16 1217     WBC 19.29 (H) x10 3/uL      Hgb 11.2 (L) g/dL      Hematocrit 40.9 (L) %      Platelets 299 x10 3/uL      RBC 4.12 (L) x10 6/uL      MCV 92.0 fL      MCH 27.2 (L) pg      MCHC 29.6 (L) g/dL      RDW 15 %      MPV 10.1 fL      Neutrophils 77.4 %      Lymphocytes Automated 8.2 %      Monocytes 11.6 %      Eosinophils Automated 1.9 %      Basophils Automated 0.4 %      Immature Granulocyte 0.5 %      Nucleated RBC 0.0 /100 WBC      Neutrophils Absolute 14.94 (H) x10 3/uL      Abs Lymph Automated 1.58 x10 3/uL      Abs Mono Automated 2.23 (H) x10 3/uL      Abs Eos Automated 0.36 x10 3/uL      Absolute Baso Automated 0.08 x10 3/uL      Absolute Immature Granulocyte 0.10 (H) x10 3/uL      Absolute NRBC 0.00 x10 3/uL     Lactic acid [[id number]] Collected:  12/14/16 1207    Specimen:  Blood Updated:  12/14/16 1208    Narrative:       ABG OK;no tnqt;ice;call RT for results.          Radiologic Studies -   Radiology Results (24 Hour)     Procedure Component Value Units Date/Time    XR Chest  AP Portable [[id number]] Collected:  12/14/16 1319    Order Status:  Completed Updated:   12/14/16 1325    Narrative:       PORTABLE CHEST    CLINICAL STATEMENT: Cough and fever.     COMPARISON: No prior studies are available for comparison.    FINDINGS: The study is limited due to the patients body habitus and  portable technique.    Tracheostomy tube is present, it appears to be appropriately positioned.  There is a moderate volume right pleural effusion and consolidation  throughout the right lung base. A small left pleural effusion is present  with consolidation throughout the retrocardiac region. The heart is  mildly enlarged and there is tortuosity and atherosclerotic  calcification of the thoracic aorta.          Impression:         1. Moderate volume right and small volume left pleural effusions.  2. Density throughout the lung bases which may represent atelectatic  changes or infiltrates.     A follow-up PA and lateral chest is recommended when the patient is  stable        Fonnie Mu, MD   12/14/2016 1:21 PM      .    Medical Decision Making   I am the first provider for this patient.    I reviewed the vital signs, available nursing notes, past medical history, past surgical history, family history and social history.    Vital Signs-Reviewed the patient's vital signs.     Patient Vitals for the past 12 hrs:   BP Temp Pulse Resp   12/14/16  1700 100/53 - (!) 104 (!) 45   12/14/16 1645 114/52 - (!) 111 (!) 44   12/14/16 1630 109/55 - (!) 104 (!) 34   12/14/16 1615 110/55 - (!) 105 (!) 40   12/14/16 1600 99/52 - (!) 105 (!) 39   12/14/16 1545 104/56 - (!) 112 (!) 39   12/14/16 1540 - 99.3 F (37.4 C) - -   12/14/16 1410 136/57 - (!) 122 22   12/14/16 1354 109/55 (!) 101.6 F (38.7 C) (!) 125 20   12/14/16 1330 108/71 - (!) 124 17   12/14/16 1204 113/67 - (!) 105 19   12/14/16 1158 123/69 (!) 101.1 F (38.4 C) (!) 132 (!) 24       Pulse Oximetry Analysis - Normal 99% on RA    Cardiac Monitor:  Rate: 122  Rhythm:  Atrial Fibrillation with RVR    EKG:  Interpreted by the EP.   Time Interpreted:  1200   Rate: 98   Rhythm: Atrial Fibrillation    QTc: 439   Interpretation: No ischemic changes   Comparison: No prior study is available for comparison.    Critical Care Time:    CRITICAL CARE: The high probability of sudden, clinically significant deterioration in the patient's condition required the highest level of my preparedness to intervene urgently.    The services I provided to this patient were to treat and/or prevent clinically significant deterioration that could result in: death  Services included the following: chart data review, reviewing nursing notes and/or old charts, documentation time, consultant collaboration regarding findings and treatment options, medication orders and management, direct patient care, re-evaluations, vital sign assessments and ordering, interpreting and reviewing diagnostic studies/lab tests.    Aggregate critical care time was 75 minutes, which includes only time during which I was engaged in work directly related to the patient's care, as described above, whether at the bedside or elsewhere in the Emergency Department.  It did not include time spent performing other reported procedures or the services of residents, students, nurses or physician assistants.      Old Medical Records: Old medical records. Nursing notes.    Admit Decision Time:  The decision to admit this patient was made by the emergency provider at 1:46 PM on 12/14/16.    ED Course:     12:19 PM -Per respiratory, after suction pts o2 stat is 100%. Will place on ventilator for respiratory support.    1:00 PM - Updated family with results and plan, they are agreeable.    1:35 PM - Janina Mayo is uncuffed, unable to get good seal with ventilator.  However at this time much improved after suctioning, does not appear to currently need vent.      1:44 PM - Discussed pt case with Dr. [name], internal medicine, who agrees with ICU admission.     1:46 PM - Discussed pt case with Dr. Jeryl Columbia, ICU, who accepts pt for  hospitalization.    2:27 PM - Per RN, pt is destating to 60s% on O2. Will attempt suction. Improved oxygenation.    Diagnosis     Clinical Impression:   1. Acute febrile illness    2. Hypoxia    3. Altered mental status, unspecified altered mental status type    4. Pneumonia of right lung due to infectious organism, unspecified part of lung        Treatment Plan:   ED Disposition     ED Disposition Condition Date/Time Comment  Admit  Fri Dec 14, 2016  1:50 PM Admitting Physician: Lattie Haw [[id number]]   Diagnosis: Acute febrile illness [649203]   Estimated Length of Stay: > or = to 2 midnights   Tentative Discharge Plan?: Home or Self Care [1]   Patient Class: Inpatient [101]              _______________________________      This note is prepared by  Gaylene Brooks acting as Scribe for Charlott Holler, MD.    Charlott Holler, MD.  The scribe's documentation has been prepared under my direction and personally reviewed by me in its entirety.  I confirm that the note above accurately reflects all work, treatment, procedures, and medical decision making performed by me.  _______________________________       Theresia Bough, MD  12/15/16 6045       Theresia Bough, MD  12/15/16 [phone]

## 2016-12-14 NOTE — Procedures (Signed)
Brief Procedure Note Central Line Placement    Physician(s): Shakaria Raphael  L  Bexley Laubach    Assistant(s):  None    Consent:   On chart/emergent    Pre-operative Diagnosis:  Hypotension, Need for IV access    Post-operative Diagnosis: Same as pre-op    Procedure(s) Performed: Triple lumen catheter was placed under full barrier/sterile technique using seldinger technique. Good venous return and flush from all 3 ports. Ultrasound guided.    Location:Left Femoral                                                                                                                                                                                                        Anesthesia:     Local 2cc lidocaine    Complications: None    Estimated Blood Loss:  None    Blood Aministered:  None    Fluid Aministered:  Per Nursing          Delane Wessinger  L  Tasheika Kitzmiller, NP

## 2016-12-14 NOTE — Progress Notes (Signed)
Received patient from E.R on 12L 40% Trach collar.  Patient has a number 6 XLT Trach, it appears that the cuff line has been cut. No distress noted, placed patient on Trach collar 40%. Will contact MD on orders for patient.

## 2016-12-14 NOTE — Procedures (Signed)
Brief Procedure Note Arterial Line     Physician(s): Demia Viera  L  Brayln Duque    Assistant(s):  None    Consent:   On chart/emergent    Pre-operative Diagnosis:  Hypotension, Need hemodynamic monitoring    Post-operative Diagnosis: Same as pre-op    Procedure(s) Performed: Arterial catheter was placed under full barrier/sterile technique using seldinger technique. Good pulsatile return. Good waveform on monitor.    Location: Left Femoral                                                                                                                                                                                                        Anesthesia:     Local 2cc lidocaine    Complications: None    Estimated Blood Loss:  None    Blood Aministered:  None    Fluid Aministered:  Per Nursing    Forestine Na, NP

## 2016-12-14 NOTE — ED Triage Notes (Signed)
Pt BIBA from [place] and is in mild respiratory distress.  Pt staff states ams since this morning and increased effort of breathing.Pt has a trach and g-tube.

## 2016-12-14 NOTE — Consults (Signed)
Infectious diseases consultation    Referring physician:Rashid Nayyar  Date of admission: January 5  Date of consultation: January 5  Reason for consultation: Management of pneumonia    This is a 83 year old white male with a history of C-spine trauma with a chronic trach of the last 24-48 hours had a change in mental status, fever, chills.  Rest 20, distress, increased secretions and desaturation.  Chest x-ray in the emergency room showed a right pleural effusion with infiltrate.  He is now on T bar.  As recently as 2 days ago he was talking and participating in physical therapy.    Past medical history:  1.  C-spine fracture  2.  Hypertension  3.  Chronic respiratory failure with trach  4.  Chronic kidney disease    Family history: Unremarkable    Social history:  Nonsmoker nondrinker    Medications:  Norvasc, amiodarone, Lipitor, Pulmicort, Bumex, Pepcid, Lasix Robinul, Lopressor, Remeron, scopolamine     ALLERGIES:  Dilaudid    Review of systems: As above and in addition  Insomnia  Constipation  Chronic pain  Dysphagia    Physical examination  Vital signs: Temperature 11.6, blood pressure 136/57, pulse 122  Gen.Janina Mayo in T bar.  Increased secretions  Lungs: Coarse breath sounds  Heart: Regular rhythm, S1, S2 appreciated without murmur, gallop  Abdomen: Bowel sounds heard.  Liver edge palpable.  No spleen tip.  Nontender, nondistended  Extremities: Edema  Neuro cannot be fully assessed    Imaging:  Chest x-ray: Bilateral effusions.    Labs:  WBC 19.2, hemoglobin 11.2, hematocrit 37.9, platelet count 299,000  BUN 30, creatinine 0.8, CO2 42.  Lactic acid 1.4.  BNP 143  Bilirubin 0.5, alkaline phosphatase 65 ALTs 17, aspartate aminotransferase 16, blood gas: PH 7.33, pCO2 78, pO2 69 on T piece FiO2 40 percent  Pro Calcitonin less than 0.1.  Urinalysis: Esterase, moderate 11-25 white cells    Assessment  1.  Pneumonia  2.  CO2 retention.  3.  Chronic respiratory failure.  4.  History gram-negative in the  sputum    Recommendations  1.  Check cultures  2.  Start empiric meropenem based on all culture of Pseudomonas and based on his being at [facility]  Thank you for consultation

## 2016-12-14 NOTE — ED Notes (Signed)
Bed: PU38  Expected date: 12/14/16  Expected time: 11:23 AM  Means of arrival: [facility] #[id number]  Comments:  MEDIC 203

## 2016-12-14 NOTE — H&P (Signed)
CRITICAL CARE ADMISSION    Date Time: 12/14/16 3:14 PM  Patient Name: [name],[name]  Attending Physician: Lattie Haw, MD    ICU Problem List: Healthcare associated pneumonia, urinary tract infection, sepsis, acute on chronic respiratory failure, atrial fibrillation.    Assessment:     .  Healthcare associated pneumonia.  .  Urinary tract infection.  .  Sepsis.  .  Acute on chronic respiratory failure.  .  Atrial fibrillation  .  Conjunctivitis.  .  Hypertension  .  History of C-spine injury    Plan:     .  Start vancomycin and Zosyn, follow culture and sensitivity, will repeat chest x-ray in the morning  .  Continue present antibiotic, although urine culture  .  Was given fluid bolus in the emergency room.  Continue IV fluid, hold off on pressors  .  On aerosol trach collar, may require ventilator support.  .  Controlled ventricular rate, continue Xarelto and beta blocker.  .  Start antibiotic ointment  .  Resume all medications as tolerated.    Discussed with patient's son and daughter at bedside in detail, explained clinical condition and treatment plan  Discussed with emergency room physician     History of Present Illness:   [name] is a 83 y.o. male with past medical history significant for C-spine trauma several months ago was in ventilator.  However, over the last 3 weeks he has been tolerating trach collar and is a resident of [name] transferred today because of fever, oxygen desaturation and respiratory distress.  Chest x-ray done in the emergency room showed right pleural effusion with infiltrate.  Patient was in respiratory distress, was placed on ventilator temporarily.  At present he is on aerosol trach collar seems chronically ill-appearing.  Patient's son and daughter are at the bedside.  Most history is obtained from the    Past Medical History:     Past Medical History:   Diagnosis Date   . C2 cervical fracture    . Chronic pain    . Conjunctivitis    .  Constipation    . Cutaneous abscess of buttock    . Dysphagia    . Hypertension    . Insomnia    . Kidney disorder    . Respiratory arrest        Past Surgical History:     Past Surgical History:   Procedure Laterality Date   . GASTROSTOMY TUBE PLACEMENT     . TRACHEOSTOMY         Family History:   History reviewed. No pertinent family history.    Social History:     Social History     Social History   . Marital status: Single     Spouse name: N/A   . Number of children: N/A   . Years of education: N/A     Social History Main Topics   . Smoking status: Never Smoker   . Smokeless tobacco: Never Used   . Alcohol use No   . Drug use: No   . Sexual activity: Not on file     Other Topics Concern   . Not on file     Social History Narrative   . No narrative on file       Home Medications:     Prescriptions Prior to Admission   Medication Sig Dispense Refill Last Dose   . amLODIPine (NORVASC) 5 MG tablet TAKE ONE TABLET BY MOUTH  EVERY DAY      . Nutritional Supplements (VITAL AF 1.2 CAL) Liquid Take 1 packet twice a day via feeding tube      . albuterol-ipratropium (DUO-NEB) 2.5-0.5(3) mg/3 mL nebulizer Take 3 mLs by nebulization 4 (four) times daily.In addition to every 4 hours as needed   Unknown at Unknown time   . amiodarone (PACERONE) 200 MG tablet Place 1 tablet (200 mg total) into feeding tube daily.   Unknown at Unknown time   . Ascorbic Acid (VITAMIN C) 500 MG tablet 500 mg by per G tube route daily.   Unknown at Unknown time   . atorvastatin (LIPITOR) 20 MG tablet 20 mg by per G tube route nightly.       Unknown at Unknown time   . budesonide (PULMICORT) 0.5 MG/2ML nebulizer solution Take 0.5 mg by nebulization 2 (two) times daily.   Unknown at Unknown time   . bumetanide (BUMEX) 0.5 MG tablet 0.5 mg by per G tube route daily.   Unknown at Unknown time   . chlorhexidine (PERIDEX) 0.12 % solution Use as directed 15 mLs in the mouth or throat 2 (two) times daily.   Unknown at Unknown time   . famotidine (PEPCID) 20  MG tablet Take 20 mg by mouth nightly.   Unknown at Unknown time   . furosemide (LASIX) 40 MG tablet 40 mg by per G tube route daily.   Unknown at Unknown time   . glycopyrrolate (ROBINUL) 1 MG tablet 1 mg by per G tube route daily.   Unknown at Unknown time   . metoclopramide (REGLAN) 5 MG tablet 5 mg by per G tube route every 8 (eight) hours.   Unknown at Unknown time   . metoprolol tartrate (LOPRESSOR) 25 MG tablet 25 mg by per G tube route 2 (two) times daily.   Unknown at Unknown time   . mirtazapine (REMERON) 15 MG tablet 15 mg by per G tube route nightly.   Unknown at Unknown time   . Multiple Vitamin (TAB-A-VITE) Tab 1 tablet daily.Via g tube   Unknown at Unknown time   . potassium chloride 20 MEQ/15ML (10%) oral solution 10 mEq by per G tube route daily.   Unknown at Unknown time   . rivaroxaban (XARELTO) 20 MG Tab 20 mg by per G tube route daily with dinner.   Unknown at Unknown time   . scopolamine (TRANSDERM-SCOP) 1.5 mg Place 1 patch onto the skin every third day.   Unknown at Unknown time   . senna (SENOKOT) 8.6 MG tablet 2 tablets by per G tube route daily as needed for Constipation.   Unknown at Unknown time        Allergies:     Allergies   Allergen Reactions   . Dilaudid [Hydromorphone Hcl]        Inpatient Medications:      Scheduled Meds: PRN Meds:      acetylcysteine (MUCOMYST) nebulizer solution 10%  Nebulization Q8H SCH   amiodarone 200 mg Oral Daily   amLODIPine 5 mg Oral Daily   atorvastatin 20 mg Oral QHS   budesonide 0.5 mg Nebulization BID   ciprofloxacin 1 drop Right Eye Q2H WA   famotidine 20 mg Oral Q12H SCH   glycopyrrolate 1 mg Oral Daily   metoclopramide 5 mg Oral TID AC   metoprolol tartrate 25 mg Oral Q12H SCH   mirtazapine 15 mg Oral QHS   multivitamin 1 tablet Oral Daily   rivaroxaban 20  mg Oral Daily with dinner   senna 8.8 mg Oral QHS   sodium chloride (PF) 3 mL Intravenous Q8H   vancomycin 2,000 mg Intravenous Once in ED       Continuous Infusions:     acetaminophen 650 mg  Q6H PRN   albuterol-ipratropium 3 mL Q4H PRN   calcium GLUConate 1 g PRN   dextrose 15 g of glucose PRN   And     dextrose 12.5 g PRN   And     glucagon (rDNA) 1 mg PRN   fentaNYL (PF) 50 mcg Q5 Min PRN   fentaNYL (PF) 50 mcg Q1H PRN   magnesium sulfate 1 g PRN   ondansetron 4 mg Q8H PRN   potassium chloride 10 mEq PRN   sodium phosphate IVPB 15 mmol PRN   sodium phosphate IVPB 25 mmol PRN   sodium phosphate IVPB 35 mmol PRN           Review of Systems:     Detailed review of system cannot be obtained as patient is lethargic, following simple commands on aerosol trach collar     Physical Exam:     VITAL SIGNS   Temp:  [101.1 F (38.4 C)-101.6 F (38.7 C)] 101.6 F (38.7 C)  Heart Rate:  [105-132] 122  Resp Rate:  [17-24] 22  BP: (108-136)/(55-71) 136/57  FiO2:  [40 %] 40 %  Blood Glucose:  Pulse ox:  Telemetry:  Vent Settings  FiO2: 40 %    Intake/Output Summary (Last 24 hours) at 12/14/16 1514  Last data filed at 12/14/16 1350   Gross per 24 hour   Intake              100 ml   Output                0 ml   Net              100 ml        General appearance - Awake, seems to be in mild respiratory distress   Mental status - alert,  awake, following simple commands   Eyes - pupils equal and reactive, extraocular eye movements intact  Ears - no external ear lesions seen  Nose - no nasal discharge   Mouth - clear oral mucosa, moist   Neck - supple, no significant adenopathy, tracheostomy in place    Chest -  course breath sounds bilaterally, no crackles, no wheezing   Heart - normal rate, irregular rhythm, normal S1, S2, no  rubs, clicks or gallops  Abdomen - soft, nontender, nondistended, no masses or organomegaly  Neurological - awake, following simple commands, no movement disorder noted  Musculoskeletal - no joint swelling or tenderness  Extremities -  plus pedal edema, no clubbing or cyanosis  Skin - normal coloration, no rashes    Invasive ICU Hemodynamics:    Vent Settings: On aerosol trach  collar    Lines/Drains/Airways:    Patient Lines/Drains/Airways Status    Active PICC Line / CVC Line / PIV Line / Drain / Airway / Intraosseous Line / Epidural Line / ART Line / Line / Wound / Pressure Ulcer / NG/OG Tube     Name:   Placement date:   Placement time:   Site:   Days:    Peripheral IV 12/14/16 Right Antecubital  12/14/16    1205    Antecubital    less than 1  Labs:     Results     Procedure Component Value Units Date/Time    Magnesium [[id number]] Collected:  12/14/16 1207    Specimen:  Blood Updated:  12/14/16 1504     Magnesium 2.0 mg/dL     Phosphorus [[id number]] Collected:  12/14/16 1207    Specimen:  Blood Updated:  12/14/16 1504     Phosphorus 3.7 mg/dL     Hemolysis index [[id number]] Collected:  12/14/16 1207     Updated:  12/14/16 1504     Hemolysis Index 6    TSH [[id number]] Collected:  12/14/16 1207    Specimen:  Blood Updated:  12/14/16 1452    Blood Culture Aerobic/Anaerobic #1 [[id number]] Collected:  12/14/16 1207    Specimen:  Arm from Blood Updated:  12/14/16 1418    Narrative:       1 BLUE+1 PURPLE    Sputum Culture [[id number]] Collected:  12/14/16 1206    Specimen:  Sputum from Tracheal Aspirate Updated:  12/14/16 1401    UA, Reflex to Microscopic (pts 3 + yrs) [[id number]]  (Abnormal) Collected:  12/14/16 1315    Specimen:  Urine Updated:  12/14/16 1347     Urine Type Clean Catch     Color, UA Yellow     Clarity, UA Hazy     Specific Gravity UA 1.014     Urine pH 5.0     Leukocyte Esterase, UA Moderate (A)     Nitrite, UA Negative     Protein, UR Negative     Glucose, UA Negative     Ketones UA Negative     Urobilinogen, UA Negative mg/dL      Bilirubin, UA Negative     Blood, UA Negative     RBC, UA 0 - 2 /hpf      WBC, UA 11 - 25 (A) /hpf      Hyaline Casts, UA 0 - 3 /lpf      Urine Mucus Present    Urine culture [[id number]] Collected:  12/14/16 1315    Specimen:  Urine from Urine, Catheterized, In & Out Updated:  12/14/16 1336    Blood Culture Aerobic/Anaerobic #2  [[id number]] Collected:  12/14/16 1318    Specimen:  Arm from Blood Updated:  12/14/16 1323    Narrative:       1 BLUE+1 PURPLE    Arterial Blood Gas (ABG) [[id number]]  (Abnormal) Collected:  12/14/16 1322    Specimen:  Blood, Arterial Updated:  12/14/16 1322     pH, Arterial 7.427     pCO2, Arterial 63.2 (HH) mmHg      pO2, Arterial 169.0 (H) mmHg      HCO3, Arterial 40.9 (H) mEq/L      Arterial Total CO2 37.1 (H) mEq/L      Base Excess, Arterial 14.2 (H) mEq/L      O2 Sat, Arterial 100.0 %      ABG CollectionSite Right Radl     Allen's Test Yes     Temperature 37.0     FIO2 50 %      O2 Delivery Ventilator     Rate 14 BPM      Mode: ac     PEEP 5     Tidal vol. 400    Lactic acid, plasma [[id number]] Collected:  12/14/16 1322     Updated:  12/14/16 1322     Lactic acid 1.4 mmol/L     Troponin I [[id number]] Collected:  12/14/16 1207  Specimen:  Blood Updated:  12/14/16 1241     Troponin I <0.01 ng/mL     Comprehensive metabolic panel [[id number]]  (Abnormal) Collected:  12/14/16 1207    Specimen:  Blood Updated:  12/14/16 1238     Glucose 119 (H) mg/dL      BUN 40.9 (H) mg/dL      Creatinine 0.8 mg/dL      Sodium 811 mEq/L      Potassium 5.1 mEq/L      Chloride 87 (L) mEq/L      CO2 42 (H) mEq/L      Calcium 8.6 mg/dL      Protein, Total 5.8 (L) g/dL      Albumin 2.1 (L) g/dL      AST (SGOT) 16 U/L      ALT 17 U/L      Alkaline Phosphatase 65 U/L      Bilirubin, Total 0.5 mg/dL      Globulin 3.7 (H) g/dL      Albumin/Globulin Ratio 0.6 (L)     Anion Gap 9.0    Hemolysis index [[id number]] Collected:  12/14/16 1207     Updated:  12/14/16 1230     Hemolysis Index 2    GFR [[id number]] Collected:  12/14/16 1207     Updated:  12/14/16 1230     EGFR >60.0    Rapid influenza A/B antigens [[id number]] Collected:  12/14/16 1207    Specimen:  Nasopharyngeal from Nasal Aspirate Updated:  12/14/16 1230    Narrative:       ORDER#: [id number]                                    ORDERED BY: NITZBERG, MICHA  SOURCE: Nasal Aspirate                                COLLECTED:  12/14/16 12:07  ANTIBIOTICS AT COLL.:                                RECEIVED :  12/14/16 12:14  Influenza Rapid Antigen A&B                FINAL       12/14/16 12:29  12/14/16   Negative for Influenza A and B             Reference Range: Negative      CBC with differential [[id number]]  (Abnormal) Collected:  12/14/16 1207    Specimen:  Blood from Blood Updated:  12/14/16 1217     WBC 19.29 (H) x10 3/uL      Hgb 11.2 (L) g/dL      Hematocrit 03.4 (L) %      Platelets 299 x10 3/uL      RBC 4.12 (L) x10 6/uL      MCV 92.0 fL      MCH 27.2 (L) pg      MCHC 29.6 (L) g/dL      RDW 15 %      MPV 10.1 fL      Neutrophils 77.4 %      Lymphocytes Automated 8.2 %      Monocytes 11.6 %      Eosinophils Automated 1.9 %      Basophils Automated 0.4 %  Immature Granulocyte 0.5 %      Nucleated RBC 0.0 /100 WBC      Neutrophils Absolute 14.94 (H) x10 3/uL      Abs Lymph Automated 1.58 x10 3/uL      Abs Mono Automated 2.23 (H) x10 3/uL      Abs Eos Automated 0.36 x10 3/uL      Absolute Baso Automated 0.08 x10 3/uL      Absolute Immature Granulocyte 0.10 (H) x10 3/uL      Absolute NRBC 0.00 x10 3/uL     MRSA Culture [[id number]] Collected:  12/14/16 1208    Specimen:  Body Fluid from Nares and Throat Updated:  12/14/16 1208    Lactic acid [[id number]] Collected:  12/14/16 1207    Specimen:  Blood Updated:  12/14/16 1208    Narrative:       ABG OK;no tnqt;ice;call RT for results.            Rads:     Radiology Results (24 Hour)     Procedure Component Value Units Date/Time    XR Chest  AP Portable [[id number]] Collected:  12/14/16 1319    Order Status:  Completed Updated:  12/14/16 1325    Narrative:       PORTABLE CHEST    CLINICAL STATEMENT: Cough and fever.     COMPARISON: No prior studies are available for comparison.    FINDINGS: The study is limited due to the patients body habitus and  portable technique.    Tracheostomy tube is present, it appears to be appropriately positioned.  There is  a moderate volume right pleural effusion and consolidation  throughout the right lung base. A small left pleural effusion is present  with consolidation throughout the retrocardiac region. The heart is  mildly enlarged and there is tortuosity and atherosclerotic  calcification of the thoracic aorta.          Impression:         1. Moderate volume right and small volume left pleural effusions.  2. Density throughout the lung bases which may represent atelectatic  changes or infiltrates.     A follow-up PA and lateral chest is recommended when the patient is  stable        Fonnie Mu, MD   12/14/2016 1:21 PM          Signed by: Lattie Haw  Date/Time: 12/14/16 3:14 PM    Critical care time spent 45 minutes

## 2016-12-15 ENCOUNTER — Inpatient Hospital Stay: Payer: Medicare Other

## 2016-12-15 LAB — URINALYSIS WITH MICROSCOPIC
Bilirubin, UA: NEGATIVE
Blood, UA: NEGATIVE
Glucose, UA: NEGATIVE
Ketones UA: 80
Nitrite, UA: NEGATIVE
Protein, UR: 30 — AB
Specific Gravity UA: 1.02 (ref 1.001–1.035)
Urine pH: 6 (ref 5.0–8.0)
Urobilinogen, UA: NEGATIVE mg/dL

## 2016-12-15 LAB — CBC
Absolute NRBC: 0 10*3/uL
Hematocrit: 30.8 % — ABNORMAL LOW (ref 42.0–52.0)
Hgb: 9.9 g/dL — ABNORMAL LOW (ref 13.0–17.0)
MCH: 27.3 pg — ABNORMAL LOW (ref 28.0–32.0)
MCHC: 32.1 g/dL (ref 32.0–36.0)
MCV: 84.8 fL (ref 80.0–100.0)
MPV: 12.6 fL — ABNORMAL HIGH (ref 9.4–12.3)
Nucleated RBC: 0 /100 WBC (ref 0.0–1.0)
Platelets: 54 10*3/uL — ABNORMAL LOW (ref 140–400)
RBC: 3.63 10*6/uL — ABNORMAL LOW (ref 4.70–6.00)
RDW: 15 % (ref 12–15)
WBC: 16.51 10*3/uL — ABNORMAL HIGH (ref 3.50–10.80)

## 2016-12-15 LAB — CBC AND DIFFERENTIAL
Absolute NRBC: 0 10*3/uL
Hematocrit: 30.2 % — ABNORMAL LOW (ref 42.0–52.0)
Hgb: 9.5 g/dL — ABNORMAL LOW (ref 13.0–17.0)
MCH: 27.1 pg — ABNORMAL LOW (ref 28.0–32.0)
MCHC: 31.5 g/dL — ABNORMAL LOW (ref 32.0–36.0)
MCV: 86.3 fL (ref 80.0–100.0)
MPV: 11.6 fL (ref 9.4–12.3)
Nucleated RBC: 0 /100 WBC (ref 0.0–1.0)
Platelets: 39 10*3/uL — ABNORMAL LOW (ref 140–400)
RBC: 3.5 10*6/uL — ABNORMAL LOW (ref 4.70–6.00)
RDW: 15 % (ref 12–15)
WBC: 16.23 10*3/uL — ABNORMAL HIGH (ref 3.50–10.80)

## 2016-12-15 LAB — PREPARE PLATELETS
Expiration Date: 201801072359
Status: TRANSFUSED
UTYPE: A NEG

## 2016-12-15 LAB — CELL MORPHOLOGY
Cell Morphology: ABNORMAL — AB
Platelet Estimate: DECREASED — AB

## 2016-12-15 LAB — PREPARE FRESH FROZEN PLASMA
Expiration Date: 201801111341
Expiration Date: 201801111345
Status: TRANSFUSED
Status: TRANSFUSED
UTYPE: A POS
UTYPE: A POS

## 2016-12-15 LAB — GLUCOSE WHOLE BLOOD - POCT
Whole Blood Glucose POCT: 102 mg/dL — ABNORMAL HIGH (ref 70–100)
Whole Blood Glucose POCT: 99 mg/dL (ref 70–100)
Whole Blood Glucose POCT: 117 mg/dL — ABNORMAL HIGH (ref 70–100)
Whole Blood Glucose POCT: 116 mg/dL — ABNORMAL HIGH (ref 70–100)
Whole Blood Glucose POCT: 94 mg/dL (ref 70–100)
Whole Blood Glucose POCT: 103 mg/dL — ABNORMAL HIGH (ref 70–100)

## 2016-12-15 LAB — MAN DIFF ONLY
Band Neutrophils Absolute: 0 10*3/uL (ref 0.00–1.00)
Band Neutrophils: 0 %
Basophils Absolute Manual: 0 10*3/uL (ref 0.00–0.20)
Basophils Manual: 0 %
Eosinophils Absolute Manual: 0.16 10*3/uL (ref 0.00–0.70)
Eosinophils Manual: 1 %
Lymphocytes Absolute Manual: 1.79 10*3/uL (ref 0.50–4.40)
Lymphocytes Manual: 11 %
Monocytes Absolute: 1.14 10*3/uL (ref 0.00–1.20)
Monocytes Manual: 7 %
Neutrophils Absolute Manual: 13.15 10*3/uL — ABNORMAL HIGH (ref 1.80–8.10)
Segmented Neutrophils: 81 %

## 2016-12-15 LAB — PT AND APTT
PT INR: 1.7 — ABNORMAL HIGH (ref 0.9–1.1)
PT: 20.2 s — ABNORMAL HIGH (ref 12.6–15.0)
PTT: 52 s — ABNORMAL HIGH (ref 23–37)

## 2016-12-15 LAB — ECG 12-LEAD
Atrial Rate: 89 {beats}/min
Q-T Interval: 344 ms
QRS Duration: 94 ms
QTC Calculation (Bezet): 439 ms
R Axis: 28 degrees
T Axis: 25 degrees
Ventricular Rate: 98 {beats}/min

## 2016-12-15 LAB — TYPE AND SCREEN
AB Screen Gel: NEGATIVE
ABO Rh: A POS

## 2016-12-15 LAB — BASIC METABOLIC PANEL
Anion Gap: 9 (ref 5.0–15.0)
BUN: 24 mg/dL (ref 9.0–28.0)
CO2: 32 mEq/L — ABNORMAL HIGH (ref 22–29)
Calcium: 7.8 mg/dL — ABNORMAL LOW (ref 7.9–10.2)
Chloride: 97 mEq/L — ABNORMAL LOW (ref 100–111)
Creatinine: 0.6 mg/dL — ABNORMAL LOW (ref 0.7–1.3)
Glucose: 91 mg/dL (ref 70–100)
Potassium: 3.7 mEq/L (ref 3.5–5.1)
Sodium: 138 mEq/L (ref 136–145)

## 2016-12-15 LAB — HEMOGLOBIN A1C
Average Estimated Glucose: 99.7 mg/dL
Hemoglobin A1C: 5.1 % (ref 4.6–5.9)

## 2016-12-15 LAB — PROCALCITONIN: Procalcitonin: 0.9 — ABNORMAL HIGH (ref 0.0–0.1)

## 2016-12-15 LAB — GFR: EGFR: >60

## 2016-12-15 LAB — IMMATURE PLT FRACTION
Immature Platelet Fraction: 12.8 % — ABNORMAL HIGH (ref 0.9–11.2)
Immature Platelet Fraction: 11.2 % (ref 0.9–11.2)

## 2016-12-15 LAB — HEMOLYSIS INDEX: Hemolysis Index: 3 (ref 0–18)

## 2016-12-15 LAB — HEMOGLOBIN AND HEMATOCRIT, BLOOD
Hematocrit: 27.6 % — ABNORMAL LOW (ref 42.0–52.0)
Hgb: 8.8 g/dL — ABNORMAL LOW (ref 13.0–17.0)

## 2016-12-15 MED ORDER — NOREPINEPHRINE 8 MG/100 ML (SIMPLE)
1.0000 ug/min | Status: DC
Start: 2016-12-15 — End: 2016-12-20
  Administered 2016-12-15: 7 ug/min via INTRAVENOUS
  Administered 2016-12-16: 3 ug/min via INTRAVENOUS
  Administered 2016-12-16: 2 ug/min via INTRAVENOUS
  Filled 2016-12-15 (×3): qty 100

## 2016-12-15 MED ORDER — SODIUM CHLORIDE 0.9 % IV MBP
500.0000 mg | Freq: Four times a day (QID) | INTRAVENOUS | Status: DC
Start: 2016-12-15 — End: 2016-12-18
  Administered 2016-12-15 – 2016-12-18 (×13): 500 mg via INTRAVENOUS
  Filled 2016-12-15 (×4): qty 100
  Filled 2016-12-15: qty 0.5
  Filled 2016-12-15: qty 100
  Filled 2016-12-15: qty 0.5
  Filled 2016-12-15: qty 100
  Filled 2016-12-15: qty 0.5
  Filled 2016-12-15: qty 100
  Filled 2016-12-15 (×2): qty 0.5
  Filled 2016-12-15: qty 100
  Filled 2016-12-15: qty 0.5
  Filled 2016-12-15 (×3): qty 100
  Filled 2016-12-15 (×2): qty 0.5
  Filled 2016-12-15: qty 100
  Filled 2016-12-15 (×5): qty 0.5

## 2016-12-15 MED ORDER — VANCOMYCIN HCL 1000 MG IV SOLR
1500.0000 mg | INTRAVENOUS | Status: DC
Start: 2016-12-15 — End: 2016-12-16
  Administered 2016-12-15: 1500 mg via INTRAVENOUS
  Filled 2016-12-15 (×2): qty 1500

## 2016-12-15 MED ORDER — PANTOPRAZOLE SODIUM 40 MG IV SOLR
40.0000 mg | Freq: Two times a day (BID) | INTRAVENOUS | Status: DC
Start: 2016-12-15 — End: 2016-12-15

## 2016-12-15 MED ORDER — SODIUM CHLORIDE 0.9 % IV SOLN
8.0000 mg/h | INTRAVENOUS | Status: DC
Start: 2016-12-15 — End: 2016-12-16
  Administered 2016-12-15 – 2016-12-16 (×3): 8 mg/h via INTRAVENOUS
  Filled 2016-12-15 (×3): qty 80

## 2016-12-15 MED ORDER — LACTATED RINGERS IV BOLUS
1000.0000 mL | Freq: Once | INTRAVENOUS | Status: DC
Start: 2016-12-15 — End: 2016-12-15

## 2016-12-15 MED ORDER — POTASSIUM CHLORIDE 20 MEQ/50ML IV SOLN
20.0000 meq | INTRAVENOUS | Status: AC
Start: 2016-12-15 — End: 2016-12-15
  Administered 2016-12-15 (×2): 20 meq via INTRAVENOUS
  Filled 2016-12-15: qty 100

## 2016-12-15 MED ORDER — SODIUM CHLORIDE 0.9 % IV SOLN
INTRAVENOUS | Status: DC | PRN
Start: 2016-12-15 — End: 2016-12-27

## 2016-12-15 NOTE — Plan of Care (Signed)
Problem: Pain  Goal: Pain at adequate level as identified by patient  Outcome: Progressing  Patient placed on fentanyl gtt since beginning of shift, patient seem to be uncomfortable, grimacing and had intermittent tremors all over his body.  Patient observed not to have tremors of grimacing after fentanyl started. Patient also placed on propofol gtt while on trach and vented, patient not following commands, withdraws to pain, has cough and gag reflex, pupils 3 mm round and brisk, eye drop placed to right eye.  Patient tracheostomy sight continue to ooz with blood, Alverda Skeans NP and Daleen Bo NP aware. Continue to reinforce tracheostomy dressing for now. Patient also on levophed gtt.

## 2016-12-15 NOTE — Progress Notes (Signed)
ICU Daily Progress Note                                                  Gean Quint MD, [place], CMD                                                             [phone]    Date Time: 12/15/16 1:39 PM  Patient Name: [name],[name] 83 y.o. male admitted with <principal problem not specified>  Attending Physician: Gean Quint, MD  Room: [phone]   Admit Date: 12/14/2016  LOS: 1 day    Patient status: Inpatient  Hospital Day: 1   Seen/examined by dr Vilma Prader bagla nP  Assessment:   Acute respiratory failure hypercarbic, on vent with tracheostomy  Pneumonia, healthcare associated, proc 0.9  Septic shock  UTI  Fever related to sepsis, continues  Acute GIB with melanotic stool with clots this am  Atrial fibrillation on xarelto outpatient  HTN  Hx c spine injury with trach/peg  Anemia, stable  Coagulopathy, elevated INR from xarelto  Leukocytosis, persistent  Acute thrombocytopenia, likely relatd to sepsis  Elevated TSH    ICU PROBLEM LIST =septic shock resp failure, pna, UTI, GIB  Plan:   Vent support, trach re-inserted by resp yesterday, cuff was apparently cut  Antibiotics per ID, continues  Follow cultures  procalcitonin follow  Vasopressor for bp support  IV fluids  GI consulted for GIB  D/c xarelto  Start protonix IV  Transfuse one dose platelets and 2 units FFP, consent obtained from daughter over phone  Repeat cbc, pt/inr am  H/h every 6 hours  dvt proph: no meds sec to gib and low platelets  Gi proph: protonix IV    D/w RN, resp, daughter over the phone  Code Status: full code      Subjective:     Intubated and sedated, +burgandy stool with clots this am per rn , + fever        Medications:      Scheduled Meds: PRN Meds:      amiodarone 200 mg Oral Daily   amLODIPine 5 mg Oral Daily   atorvastatin 20 mg Oral QHS   budesonide 0.5 mg Nebulization BID   ciprofloxacin 1 drop Right Eye Q2H WA   glycopyrrolate 1 mg Oral Daily   meropenem 500 mg Intravenous Q6H   metoclopramide 5 mg Oral TID AC    metoprolol tartrate 25 mg Oral Q12H SCH   mirtazapine 15 mg Oral QHS   multivitamin 1 tablet Oral Daily   potassium chloride 20 mEq Intravenous Q1H   senna 8.8 mg Oral QHS   sodium chloride (PF) 3 mL Intravenous Q8H   vancomycin 1,500 mg Intravenous Q24H       Continuous Infusions:  . sodium chloride 50 mL/hr at 12/15/16 0400   . FentaNYL-NaCl (PF) 50 mcg/hr (12/15/16 1300)   . norepinephrine (LEVOPHED) infusion 5 mcg/min (12/15/16 1300)   . pantoprozole (PROTONIX) infusion     . propofol 20 mcg/kg/min (12/15/16 1300)      sodium chloride  PRN   acetaminophen 650 mg Q6H PRN   albuterol-ipratropium 3 mL Q4H  PRN   calcium GLUConate 1 g PRN   dextrose 15 g of glucose PRN   And     dextrose 12.5 g PRN   And     glucagon (rDNA) 1 mg PRN   fentaNYL (PF) 50 mcg Q1H PRN   magnesium sulfate 1 g PRN   ondansetron 4 mg Q8H PRN   potassium chloride 10 mEq PRN   sodium phosphate IVPB 15 mmol PRN   sodium phosphate IVPB 25 mmol PRN   sodium phosphate IVPB 35 mmol PRN         Review of Systems:       Sedated on vent, cannot give ros    Physical Exam:       General Appearance:  Sedated on vent, ill appearing, toxic  Eyes; perrla  ENT;  Dried blood in mouth  Neuro: sedated on vent  Neck: tracheostomy intact, dried blood around site  Chest: coarse bilaterally  Heart: irregular rhythm, reg rate,  Abdomen:  Soft +bowel sounds  Extremities:  No edema  Skin:   Pale, fair turgor  GU:    Foley, yellow clear  Left groin TLC and a line  Capillary refill time - < 3 sec         Data:     Invasive ICU Hemodynamics:         Art Line  Arterial Line BP: 128/59  Arterial Line MAP (mmHg): 84 mmHg  Pulse rate: 95 mmHg           IBW/kg (Calculated) : 73    ABGs:     Recent Labs  Lab 12/14/16  1958   ABG CollectionSite Art Line   Allen's Test No ART. LINE   pH, Arterial 7.495*   pCO2, Arterial 46.0*   pO2, Arterial 141.0*   HCO3, Arterial 35.2*   Base Excess, Arterial 10.8*   O2 Sat, Arterial 99.1   FIO2 60   Rate 20   Mode: PRVC   PEEP 8   Tidal  vol. 500       Vent Settings:  IBW: Vent Settings  Vent Mode: PRVC  FiO2: 40 %  Resp Rate (Set): 20  Vt (Set, mL): 500 mL  PIP Observed (cm H2O): 27 cm H2O  PEEP/EPAP: (S) 5 cm H20  Mean Airway Pressure: 13 cmH20    Patient Lines/Drains/Airways Status    Active PICC Line / CVC Line / PIV Line / Drain / Airway / Intraosseous Line / Epidural Line / ART Line / Line / Wound / Pressure Ulcer / NG/OG Tube     Name:   Placement date:   Placement time:   Site:   Days:    CVC Triple Lumen 12/14/16 Left Femoral  12/14/16    2100    Femoral    less than 1    Peripheral IV 12/14/16 Right Antecubital  12/14/16    1205    Antecubital    1    Gastrostomy/Enterostomy Gastrostomy RUQ          RUQ        Urethral Catheter Temperature probe 16 Fr.  12/14/16    2300    Temperature probe    less than 1    Surgical Airway Shiley 6 mm Cuffed;Long  12/14/16    1855    6 mm    less than 1    Arterial Line 12/14/16 Left Femoral  12/14/16    2100    Femoral    less  than 1    Wound 12/14/16 Pressure Injury Sacrum Red open area with drainage  12/14/16    1600    Sacrum    less than 1              VITAL SIGNS   Temp:  [99.3 F (37.4 C)-101.6 F (38.7 C)] 99.9 F (37.7 C)  Heart Rate:  [79-125] 97  Resp Rate:  [4-45] 7  BP: (96-141)/(52-76) 113/56  Arterial Line BP: (76-154)/(42-78) 128/59  FiO2:  [40 %-60 %] 40 %  Pulse ox: 100    Intake/Output Summary (Last 24 hours) at 12/15/16 1339  Last data filed at 12/15/16 1300   Gross per 24 hour   Intake             1299 ml   Output             1265 ml   Net               34 ml        Labs:     CBC w/Diff CMP     Recent Labs  Lab 12/15/16  1231 12/15/16  0428 12/14/16  1207   WBC 16.51* 16.23* 19.29*   Hgb 9.9* 9.5* 11.2*   Hematocrit 30.8* 30.2* 37.9*   Platelets 54* 39* 299   MCV 84.8 86.3 92.0   Neutrophils  --   --  77.4   Segmented Neutrophils  --  81  --        PT/INR     Recent Labs  Lab 12/15/16  0428 12/14/16  1552   PT INR 1.7* 1.3*         Recent Labs  Lab 12/15/16  0428 12/14/16  1207  12/12/16  0530   Sodium 138 138 138   Potassium 3.7 5.1 4.9   Chloride 97* 87* 91*   CO2 32* 42* 35*   BUN 24.0 30.0* 24.0   Creatinine 0.6* 0.8 0.6   Glucose 91 119* 100   Calcium 7.8* 8.6 8.5   Magnesium  --  2.0  --    Phosphorus  --  3.7  --    Protein, Total  --  5.8*  --    Albumin  --  2.1*  --    AST (SGOT)  --  16  --    ALT  --  17  --    Alkaline Phosphatase  --  65  --    Bilirubin, Total  --  0.5  --       Glucose POCT     Recent Labs  Lab 12/15/16  0428 12/14/16  1207 12/12/16  0530   Glucose 91 119* 100          Recent Labs  Lab 12/14/16  1552 12/14/16  1207   Troponin I <0.01 <0.01       Urinalysis    Recent Labs  Lab 12/15/16  0429  12/14/16  0445   Urine Type Catheterized, F More results in Results Review Random   Color, UA Yellow More results in Results Review YELLOW   Clarity, UA Hazy More results in Results Review CLOUDY*   Specific Gravity UA 1.020 More results in Results Review 1.018   Urine pH 6.0 More results in Results Review 7.0   Nitrite, UA Negative More results in Results Review NEGATIVE   Ketones UA 80 More results in Results Review NEGATIVE   Urobilinogen, UA Negative More results in Results  Review 0.2   Bilirubin, UA Negative More results in Results Review NEGATIVE   Blood, UA Negative More results in Results Review NEGATIVE   RBC, UA 3 - 5 More results in Results Review 0-2   WBC, UA TNTC* More results in Results Review TNTC*   Urine Bacteria  --   --  NONE   More results in Results Review = values in this interval not displayed.    Rads:   Xr Chest Ap Portable    Result Date: 12/15/2016   Moderate right and small left pleural effusions. Right greater than left basilar opacification, improved. Mitali  Bapna, MD 12/15/2016 7:27 AM          I have personally reviewed the patient's history and 24 hour interval events, along with vitals, labs, radiology images and  ventilator settings detail.     So far today I have spent 45 minutes providing care for this patient excluding teaching and  billable procedures, and not overlapping with any other providers.:       Gean Quint, MD     12/15/16 1:39 PM

## 2016-12-15 NOTE — Progress Notes (Signed)
Maintained on ventilator with trache, PRVC mode with Fentanyl and Propofol sedation.  Pt lightly sedated, responds to pain.  VSS,,  EKG A-fib.   B/Pwith Levophed support 8mcg/min.  Pt noted to have dark maroon stool early afternoon,  MD notified and Protonix gtt initiated,  No further bleeding this pm and  H/H stable.  Platelets given ,  FFPX 2 units in progress.     UO adequate.  NPO at present time  Daughter called and updated.

## 2016-12-15 NOTE — Progress Notes (Signed)
Pharmacy Note: Vancomycin Dosing & MONITORING  Vancomycin Dosing Guidelines  Vancomycin Monitoring Protocol    Height Weight Renal Function   1.778 m (5\' 10" ) 102.3 kg (225 lb 8.5 oz) Serum creatinine: 0.6 mg/dL Low 53/97/67 3419  Estimated creatinine clearance: 115.7 mL/min     100.2 F (37.9 C)   Recent Labs     Lab 12/15/16  0428 12/14/16  1207 12/12/16  0530   WBC 16.23* 19.29* 15.45*   Creatinine 0.6* 0.8 0.6     Pharmacy was consulted on 12/15/16 by Dr. Janalyn Rouse to dose vancomycin.  Vancomycin Indication: PNA  Current regimen: new start, LD 2000 mg IV x 1 given yesterday   Goal Trough: 15-20 mcg/mL    Cultures: sputum, urine,  blood, MRSA nares - all pending   Day of Therapy: 2  Other Antimicrobials: Meropenem    Assessment/Plan:  1. Renal function: Stable - Scr 0.6, est Crcl 115 ml/min, advanced age   2. Trough/random level assessment: check first trough prior to third dose on 1/7 @1330   3. Vancomycin dosing plan:       Initial loading dose (20 mg/kg) = 2000 mg      Maintenance regimen (15 mg/kg) = 1500mg  Q 24hr  4. Continue to monitor renal function, WBC, temp, microbiologic data, missed doses, and signs/symptoms of adverse reaction.  5. De-escalation plan: monitor cultures     Thank you for this consult. Pharmacy will continue to follow this patient's progress with you until consult and/or vancomycin is discontinued.      Eugene Gavia, [place].D.  Phone: [phone]

## 2016-12-15 NOTE — Progress Notes (Signed)
MCR Fx Dx TBD and hx of PNA. Pt admitted from [place] and information on face sheet was verified by pt's daughter who indicated that she is the POA. CM updated pt's chart to include pt's son and daughter and POA.     According to pt's daughter, pt was admitted at [facility] on October 31st. Pt is bed bound and has a trach. It is reported that pt has a advance directive.     DCP Jonna Munro, CM sent referral    Genia Plants  Care Coordinator  [phone]       12/15/16 1603   Patient Type   Within 30 Days of Previous Admission? No   Healthcare Decisions   Interviewed: Family   Name of interviewee if other than the pt: [name], daughter, [place]   Interviewee Contact Information: [phone]   Orientation/Decision Making Abilities of Patient Alert and Oriented x3, able to make decisions   Advance Directive Patient has advance directive, copy in chart   Healthcare Agent Appointed Yes   Healthcare Agent's Name [name], daughter, POA   Healthcare Agent's Phone Number [phone]   Prior to admission   Prior level of function Bedbound / Total Care   Type of Residence Nursing home   Home Layout One level   Have running water, electricity, heat, etc? Yes   Living Arrangements Other (Comment)   How do you get to your MD appointments? facility   How do you get your groceries? facility   Who fixes your meals? facility   Who does your laundry? facility   Who picks up your prescriptions? facility   Dressing Dependent   Grooming Dependent   Feeding Dependent   Bathing Dependent   Toileting Dependent   DME Currently at [facility] bed;Other (Comment)  (trach and vent)   Adult Protective Services (APS) involved? No   Discharge Planning   Support Systems Children;Family members   Patient expects to be discharged to: [name]   Anticipated [place] plan discussed with: Family   Mode of transportation: Other   Consults/Providers   PT Evaluation Needed 1   OT Evalulation Needed 1   SLP Evaluation Needed 2   Outcome  Palliative Care Screen Screened but did not meet criteria for intervention   Correct PCP listed in Epic? Yes

## 2016-12-15 NOTE — Consults (Signed)
CONSULTATION NOTE    [address], [place], [place] [address]  [phone]  [name] [id number]    Date of admission: 12/14/2016  Date of consult: 12/15/16      Assessment:  Sepsis- likely due to pneumonia  Thrombocytopenia- 299-->39  Rectal Bleeding-  H/H 9.9<--9.5, at admission 11.2, baseline appears 10-1, unclear etiology lower vs upper bleed GI bleeding, if persists consider PEG tube lavage  H/o C spine fracture      [name] is a 83 y.o. male  PMH of trach and PEG dependent, C spine fracture, CKD, HTN,  A fib on Xarelto, admitted from [facility] with septic shock, dx with pneumonia currently intubated and sedated developed. Pt had 1 episode rectal bleeding this am, noted overnight to have developed acute thrombocytopenia.  Pt noted to have multiple sites oozing blood, overall H/H stable and not significantly changed from baseline.  Plan for supportive care and hold off on urgent endoscopic procedure.    ___________________________  Plan:  Hold Xarelto  Sepsis management per ICU team  Agree with PLT and FFP  Follow H and H, transfuse prn  Monitor for signs of bleeding  PPI gtt  Supportive care      [facility], [place]   1:48 PM    Thank you for allowing [place] to see and participate in this patient's care.  This case will be discussed with Dr. Andrey Campanile who will see this patient as well today and will write an accompanying GI consultation and treatment plan.  ________________________    Referring Physician: Dr Graciela Husbands    Consulting Physician: Dr. Andrey Campanile     Reason for consultation: Rectal Bleeding    Chief complaint: septic shock    HPI:  [name] is a 83 y.o. male PMH of trach and PEG dependent, C spine fracture, CKD, HTN,  A fib on Xarelto admitted from [facility] with septic shock, dx with pneumonia currently intubated and sedated. Pt developed 1 episode rectal bleeding this am, noted overnight to have developed acute thrombocytopenia and had trach collar changed with clot  suspected under trach site with gross blood at trach site.       HPI obtained from chart and bedside RN, pt currently intubated and sedated.       No prior EGD available to chart.     Colon 07/03/16 polyps: hyperplastic and TA        Past Medical History:   Diagnosis Date   . C2 cervical fracture    . Chronic pain    . Conjunctivitis    . Constipation    . Cutaneous abscess of buttock    . Dysphagia    . Hypertension    . Insomnia    . Kidney disorder    . Respiratory arrest        Past Surgical History:   Procedure Laterality Date   . GASTROSTOMY TUBE PLACEMENT     . TRACHEOSTOMY         Allergies   Allergen Reactions   . Dilaudid [Hydromorphone Hcl]        Social History     Social History   . Marital status: Single     Spouse name: N/A   . Number of children: N/A   . Years of education: N/A     Occupational History   . Not on file.     Social History Main Topics   . Smoking status: Never Smoker   . Smokeless  tobacco: Never Used   . Alcohol use No   . Drug use: No   . Sexual activity: Not on file     Other Topics Concern   . Not on file     Social History Narrative   . No narrative on file       History reviewed. No pertinent family history.    Current Discharge Medication List      CONTINUE these medications which have NOT CHANGED    Details   amLODIPine (NORVASC) 5 MG tablet TAKE ONE TABLET BY MOUTH EVERY DAY      Nutritional Supplements (VITAL AF 1.2 CAL) Liquid Take 1 packet twice a day via feeding tube      albuterol-ipratropium (DUO-NEB) 2.5-0.5(3) mg/3 mL nebulizer Take 3 mLs by nebulization 4 (four) times daily.In addition to every 4 hours as needed      amiodarone (PACERONE) 200 MG tablet Place 1 tablet (200 mg total) into feeding tube daily.      Ascorbic Acid (VITAMIN C) 500 MG tablet 500 mg by per G tube route daily.      atorvastatin (LIPITOR) 20 MG tablet 20 mg by per G tube route nightly.          budesonide (PULMICORT) 0.5 MG/2ML nebulizer solution Take 0.5 mg by nebulization 2 (two) times daily.       bumetanide (BUMEX) 0.5 MG tablet 0.5 mg by per G tube route daily.      chlorhexidine (PERIDEX) 0.12 % solution Use as directed 15 mLs in the mouth or throat 2 (two) times daily.      famotidine (PEPCID) 20 MG tablet Take 20 mg by mouth nightly.      furosemide (LASIX) 40 MG tablet 40 mg by per G tube route daily.      glycopyrrolate (ROBINUL) 1 MG tablet 1 mg by per G tube route daily.      metoclopramide (REGLAN) 5 MG tablet 5 mg by per G tube route every 8 (eight) hours.      metoprolol tartrate (LOPRESSOR) 25 MG tablet 25 mg by per G tube route 2 (two) times daily.      mirtazapine (REMERON) 15 MG tablet 15 mg by per G tube route nightly.      Multiple Vitamin (TAB-A-VITE) Tab 1 tablet daily.Via g tube      potassium chloride 20 MEQ/15ML (10%) oral solution 10 mEq by per G tube route daily.      rivaroxaban (XARELTO) 20 MG Tab 20 mg by per G tube route daily with dinner.      scopolamine (TRANSDERM-SCOP) 1.5 mg Place 1 patch onto the skin every third day.      senna (SENOKOT) 8.6 MG tablet 2 tablets by per G tube route daily as needed for Constipation.             Current Facility-Administered Medications   Medication Dose Route Frequency Last Rate Last Dose   . 0.9%  NaCl infusion   Intravenous Continuous 50 mL/hr at 12/15/16 0400     . 0.9%  NaCl infusion   Intravenous PRN       . acetaminophen (TYLENOL) suppository 650 mg  650 mg Rectal Q6H PRN       . albuterol-ipratropium (DUO-NEB) 2.5-0.5(3) mg/3 mL nebulizer 3 mL  3 mL Nebulization Q4H PRN   3 mL at 12/15/16 0027   . amiodarone (PACERONE) tablet 200 mg  200 mg Oral Daily   200 mg at 12/15/16 0934   .  amLODIPine (NORVASC) tablet 5 mg  5 mg Oral Daily   5 mg at 12/14/16 1657   . atorvastatin (LIPITOR) tablet 20 mg  20 mg Oral QHS   20 mg at 12/14/16 2246   . budesonide (PULMICORT) 0.5 MG/2ML nebulizer solution 0.5 mg  0.5 mg Nebulization BID   0.5 mg at 12/15/16 0841   . calcium GLUConate 1 g in sodium chloride 0.9 % 100 mL IVPB  1 g Intravenous PRN          . ciprofloxacin (CILOXAN) 0.3 % ophthalmic solution 1 drop  1 drop Right Eye Q2H WA   1 drop at 12/15/16 1208   . dextrose (GLUCOSE) 40 % oral gel 15 g of glucose  15 g of glucose Oral PRN        And   . dextrose 50 % bolus 12.5 g  12.5 g Intravenous PRN        And   . glucagon (rDNA) (GLUCAGEN) injection 1 mg  1 mg Intramuscular PRN       . fentaNYL (PF) (SUBLIMAZE) injection 50 mcg  50 mcg Intravenous Q1H PRN   50 mcg at 12/14/16 1916   . FentaNYL-NaCl (PF) 1000 mcg in sodium chloride 0.9% infusion 100 mL  12.5-200 mcg/hr Intravenous Continuous 5 mL/hr at 12/15/16 1300 50 mcg/hr at 12/15/16 1300   . glycopyrrolate (ROBINUL) tablet 1 mg  1 mg Oral Daily   1 mg at 12/15/16 0934   . magnesium sulfate 1g in dextrose 5% IVPB (premix)  1 g Intravenous PRN       . meropenem (MERREM) 500 mg in sodium chloride 0.9 % 100 mL IVPB mini-bag plus  500 mg Intravenous Q6H 200 mL/hr at 12/15/16 1313 500 mg at 12/15/16 1313   . metoclopramide (REGLAN) tablet 5 mg  5 mg Oral TID AC   5 mg at 12/15/16 0934   . metoprolol tartrate (LOPRESSOR) tablet 25 mg  25 mg Oral Q12H SCH   25 mg at 12/14/16 1656   . mirtazapine (REMERON) tablet 15 mg  15 mg Oral QHS   15 mg at 12/14/16 2247   . multivitamin (MULTIVITAMIN) tablet 1 tablet  1 tablet Oral Daily   1 tablet at 12/14/16 1657   . norepinephrine (LEVOPHED) 8 mg in dextrose 5% 100 mL infusion  1-20 mcg/min Intravenous Continuous 3.8 mL/hr at 12/15/16 1300 5 mcg/min at 12/15/16 1300   . ondansetron (ZOFRAN) injection 4 mg  4 mg Intravenous Q8H PRN       . pantoprazole (PROTONIX) 80 mg in sodium chloride 0.9 % 100 mL infusion  8 mg/hr Intravenous Continuous       . potassium chloride 10 mEq in 100 mL IVPB (premix)  10 mEq Intravenous PRN       . potassium chloride 20 mEq in 50 mL IVPB (premix)  20 mEq Intravenous Q1H       . propofol (DIPRIVAN) 10 mg/mL infusion (ADULT)  5-50 mcg/kg/min Intravenous Continuous 12.3 mL/hr at 12/15/16 1300 20 mcg/kg/min at 12/15/16 1300   . sennosides  (SENOKOT) syrup 8.8 mg  8.8 mg Oral QHS   8.8 mg at 12/14/16 2248   . sodium chloride (PF) 0.9 % flush 3 mL  3 mL Intravenous Q8H   3 mL at 12/14/16 2248   . sodium phosphate 15 mmol in dextrose 5 % 250 mL IVPB  15 mmol Intravenous PRN       . sodium phosphate 25 mmol in dextrose 5 %  250 mL IVPB  25 mmol Intravenous PRN       . sodium phosphate 35 mmol in dextrose 5 % 250 mL IVPB  35 mmol Intravenous PRN       . vancomycin (VANCOCIN) 1,500 mg in sodium chloride 0.9 % 500 mL IVPB  1,500 mg Intravenous Q24H           Review of Systems  Unable to assess intubated and sedated    Physical Exam  BP 113/56   Pulse 97   Temp 99.9 F (37.7 C)   Resp (!) 7   Ht 1.778 m (5\' 10" )   Wt 102.3 kg (225 lb 8.5 oz)   SpO2 99%   BMI 32.36 kg/m     General Appearance:    Intubated and sedated   HEENT:  Gross blood within oral cavity, blood soaked gauze at trach site.    Lungs:     Clear to auscultation bilaterally    Heart:    Regular rate and rhythm, S1 and S2 normal   Abdomen:     soft, +BS, non distended, non tender to palpate, PEG site w/o surrounding erythema/blood   Rectal:   deferred    Extremities:   Extremities normal, atraumatic,   Skin:   Skin color, texture, turgor normal   Neurologic:   sedated           Psychological: unable to assess    Laboratory Data reviewed:      Recent Labs  Lab 12/15/16  1231 12/15/16  0428 12/14/16  1207   WBC 16.51* 16.23* 19.29*   Hgb 9.9* 9.5* 11.2*   Hematocrit 30.8* 30.2* 37.9*   Platelets 54* 39* 299   MCV 84.8 86.3 92.0   Neutrophils  --   --  77.4   Segmented Neutrophils  --  81  --        Recent Labs  Lab 12/15/16  0428 12/14/16  1207 12/12/16  0530   Sodium 138 138 138   Potassium 3.7 5.1 4.9   Chloride 97* 87* 91*   CO2 32* 42* 35*   BUN 24.0 30.0* 24.0   Creatinine 0.6* 0.8 0.6   Glucose 91 119* 100   Calcium 7.8* 8.6 8.5   Magnesium  --  2.0  --    Phosphorus  --  3.7  --    Protein, Total  --  5.8*  --    Albumin  --  2.1*  --    AST (SGOT)  --  16  --    ALT  --  17  --       Alkaline Phosphatase  --  65  --    Bilirubin, Total  --  0.5  --      Glucose:    Recent Labs  Lab 12/15/16  0428 12/14/16  1207 12/12/16  0530   Glucose 91 119* 100       Recent Labs  Lab 12/15/16  0428 12/14/16  1552   PT 20.2* 16.2*   PT INR 1.7* 1.3*   PTT 52* 37       Radiological Imaging reviewed:  Xr Chest Ap Portable    Result Date: 12/15/2016   Moderate right and small left pleural effusions. Right greater than left basilar opacification, improved. Mitali  Bapna, MD 12/15/2016 7:27 AM

## 2016-12-15 NOTE — UM Notes (Signed)
Primary Payor: MEDICARE/MEDICARE PART A AND B   Admit to Inpatient (Order [id number]) 12/14/16 1350    Acute febrile illness   Hypoxia   Altered mental status, unspecified altered mental status type   Pneumonia of right lung due to infectious organism, unspecified part of lung      83 y.o. male with AMS, fever, likely due to right sided pneumonia. Started broad spectrum antibiotics.  Initially appeared to be on respiratory distress, improved with suctioning. Placed on vent for a short period of time but able to be taken off again.  Will likely require more ventilatory support in the future, place in ICU. A fib with RVR but rate in 110-120: this is likely compensitory from sepsis so will not lower at this time. Currently normotensive, given IV fluids, normal lactic acid.      101.6 F (38.7 C) Rectal  125 95 % -- 20 109/55 --     Specimen:  Blood Updated:  12/14/16 1630      PT 16.2 (H) sec      PT INR 1.3 (H)     PT Anticoag. Given Within 48 hrs. rivaroxaban (Xa    Narrative:       If not done within 48 hours.    Blood gas, arterial [[id number]]  (Abnormal) Collected:  12/14/16 1620    Specimen:  Blood, Arterial Updated:  12/14/16 1625     pH, Arterial 7.332 (L)     pCO2, Arterial 78.3 (HH) mmHg      pO2, Arterial 69.1 (L) mmHg      HCO3, Arterial 40.3 (H) mEq/L      Arterial Total CO2 36.9 (H) mEq/L      Base Excess, Arterial 11.6 (H) mEq/L      O2 Sat, Arterial 94.2 (L) %      ABG CollectionSite Right Radl     Allen's Test Yes     Temperature 37.0     FIO2 40 %      O2 Delivery T-Piece     O2 Flow 10.0 L/min     Sputum Culture [[id number]] Collected:  12/14/16 1206    Specimen:  Sputum from Tracheal Aspirate Updated:  12/14/16 1616    Calcium, ionized [[id number]]  (Abnormal) Collected:  12/14/16 1546    Specimen:  Blood Updated:  12/14/16 1606     Calcium, Ionized 2.29 (L) mEq/L       Arterial Blood Gas (ABG) [[id number]]  (Abnormal) Collected:  12/14/16 1322     Specimen:  Blood, Arterial Updated:  12/14/16 1322     pH, Arterial 7.427     pCO2, Arterial 63.2 (HH) mmHg      pO2, Arterial 169.0 (H) mmHg      HCO3, Arterial 40.9 (H) mEq/L      Arterial Total CO2 37.1 (H) mEq/L      Base Excess, Arterial 14.2 (H) mEq/L      O2 Sat, Arterial 100.0 %      ABG CollectionSite Right Radl     Allen's Test Yes     Temperature 37.0     FIO2 50 %      O2 Delivery Ventilator     Rate 14 BPM      Mode: ac     PEEP 5     Tidal vol. 400     Comprehensive metabolic panel [[id number]]  (Abnormal) Collected:  12/14/16 1207     Specimen:  Blood Updated:  12/14/16 1238     Glucose 119 (  H) mg/dL      BUN 16.1 (H) mg/dL      Creatinine 0.8 mg/dL      Sodium 096 mEq/L      Potassium 5.1 mEq/L      Chloride 87 (L) mEq/L      CO2 42 (H) mEq/L      Calcium 8.6 mg/dL      Protein, Total 5.8 (L) g/dL      Albumin 2.1 (L) g/dL      AST (SGOT) 16 U/L      ALT 17 U/L      Alkaline Phosphatase 65 U/L      Bilirubin, Total 0.5 mg/dL      Globulin 3.7 (H) g/dL      Albumin/Globulin Ratio 0.6 (L)       CBC with differential [[id number]]  (Abnormal) Collected:  12/14/16 1207     Specimen:  Blood from Blood Updated:  12/14/16 1217     WBC 19.29 (H) x10 3/uL      Hgb 11.2 (L) g/dL      Hematocrit 91.4 (L) %      Platelets 299 x10 3/uL      RBC 4.12 (L) x10 6/uL      MCV 92.0 fL      MCH 27.2 (L) pg      MCHC 29.6 (L) g/dL      RDW 15 %      CXR-  Impression:          1. Moderate volume right and small volume left pleural effusions.  2. Density throughout the lung bases which may represent atelectatic  changes or infiltrates.        ER MEDS:  12/14/2016 1246 acetaminophen (TYLENOL) 160 MG/5ML suspension (PEDS) 650 mg 650 mg per G tube Given Calvert Cantor, RN   12/14/2016 1332 sodium chloride 0.9 % bolus 1,000 mL 1,000 mL Intravenous [address], Fredrich Romans, RN   12/14/2016 1350  piperacillin-tazobactam (ZOSYN) 4.5 g in sodium chloride 0.9 % 100 mL IVPB mini-bag plus 0 g Intravenous Stopped Calvert Cantor, RN   12/14/2016 1320 piperacillin-tazobactam (ZOSYN) 4.5 g in sodium chloride 0.9 % 100 mL IVPB mini-bag plus 4.5 g Intravenous [address] Calton Golds, RN   12/14/2016 1404 vancomycin (VANCOCIN) 2,000 mg in sodium chloride 0.9 % 500 mL IVPB 2,000 mg Intravenous New Bag        ADMIT TO MSICU  TELE, VENT    Scheduled Meds: PRN Meds:      amiodarone 200 mg Oral Daily   amLODIPine 5 mg Oral Daily   atorvastatin 20 mg Oral QHS   budesonide 0.5 mg Nebulization BID   ciprofloxacin 1 drop Right Eye Q2H WA   glycopyrrolate 1 mg Oral Daily   meropenem 500 mg Intravenous Q6H   metoclopramide 5 mg Oral TID AC   metoprolol tartrate 25 mg Oral Q12H SCH   mirtazapine 15 mg Oral QHS   multivitamin 1 tablet Oral Daily   potassium chloride 20 mEq Intravenous Q1H   senna 8.8 mg Oral QHS   sodium chloride (PF) 3 mL Intravenous Q8H   vancomycin 1,500 mg Intravenous Q24H       Continuous Infusions:  . sodium chloride 50 mL/hr at 12/15/16 0400   . FentaNYL-NaCl (PF) 50 mcg/hr (12/15/16 1300)   . norepinephrine (LEVOPHED) infusion 5 mcg/min (12/15/16 1300)   . pantoprozole (PROTONIX) infusion     . propofol 20 mcg/kg/min (12/15/16 1300)  sodium chloride  PRN   acetaminophen 650 mg Q6H PRN   albuterol-ipratropium 3 mL Q4H PRN   calcium GLUConate 1 g PRN   dextrose 15 g of glucose PRN   And     dextrose 12.5 g PRN   And     glucagon (rDNA) 1 mg PRN   fentaNYL (PF) 50 mcg Q1H PRN   magnesium sulfate 1 g PRN   ondansetron 4 mg Q8H PRN   potassium chloride 10 mEq PRN   sodium phosphate IVPB 15 mmol PRN   sodium phosphate IVPB 25 mmol PRN   sodium phosphate IVPB 35 mmol PRN             Homero Fellers, RN, BSN, ACM, CCM  Utilization Review Nurse  [facility]  [phone] [facility] Staff ONLY)  [phone] (Carriers)    Please submit all clinical review requests via fax to [phone]

## 2016-12-15 NOTE — Progress Notes (Signed)
Infectious Diseases & Tropical Medicine  Progress Note    12/15/2016   [name] VQQ:[id number] is a 83 y.o. male,       Assessment:      Sepsis.   Chronic respiratory failure with tracheostomy   Bilateral pneumonia/effusions   Sputum culture-2 different gram-negative rods, awaiting identification.   Urine culture gram-negative rods,awaiting identification   Hypertension.   Chronic kidney disease.   Fever and leukocytosis   Resident of [name]    Plan:      Continue meropenem.   Start vancomycin.   Pro-calcitonin level.   Consider CT scan of the chest.   Await pending cultures   Monitor electrolytes and renal functions closely.   Monitor clinically   Discussed with pharmacy    ROS:     General:  persistent fever, no chills  HEENT: intubated and sedated  Respiratory: no cough, shortness of breath, or wheezing   Gastrointestinal: dark-colored stools  Genito-Urinary: no hematuria  Musculoskeletal: no edema  Neurological: sedated  Dermatological: no rash, no ulcer    Physical Examination:     Blood pressure 100/58, pulse 96, temperature 100.2 F (37.9 C), resp. rate 20, height 1.778 m (5\' 10" ), weight 102.3 kg (225 lb 8.5 oz), SpO2 100 %.     General Appearance: Comfortable, and in no acute distress.   HEENT: Pupils are equal, round, and reactive to light.    Lungs:  Bilateral crackles   Heart:  tachycardia   Chest: Symmetric chest wall expansion.    Abdomen: soft,no hepatosplenomegaly,good bowel sounds,G-tube in place   Neurological: unresponsive   Extremities:  edema    Laboratory And Diagnostic Studies:     Recent Labs      12/14/16   1207   WBC  19.29*   Hgb  11.2*   Hematocrit  37.9*   Platelets  299     Recent Labs      12/15/16   0428  12/14/16   1207   Sodium  138  138   Potassium  3.7  5.1   Chloride  97*  87*   CO2  32*  42*   BUN  24.0  30.0*   Creatinine  0.6*  0.8   Glucose  91  119*   Calcium  7.8*  8.6     Recent Labs      12/14/16   1207   AST (SGOT)   16   ALT  17   Alkaline Phosphatase  65   Protein, Total  5.8*   Albumin  2.1*       Current Meds:      Scheduled Meds: PRN Meds:      acetylcysteine (MUCOMYST) nebulizer solution 10%  Nebulization Q8H SCH   amiodarone 200 mg Oral Daily   amLODIPine 5 mg Oral Daily   atorvastatin 20 mg Oral QHS   budesonide 0.5 mg Nebulization BID   ciprofloxacin 1 drop Right Eye Q2H WA   famotidine 20 mg Oral Q12H SCH   glycopyrrolate 1 mg Oral Daily   metoclopramide 5 mg Oral TID AC   metoprolol tartrate 25 mg Oral Q12H SCH   mirtazapine 15 mg Oral QHS   multivitamin 1 tablet Oral Daily   propofol      rivaroxaban 20 mg Oral Daily with dinner   senna 8.8 mg Oral QHS   sodium chloride (PF) 3 mL Intravenous Q8H       Continuous Infusions:  . sodium chloride 50 mL/hr  at 12/15/16 0400   . FentaNYL-NaCl (PF) 50 mcg/hr (12/15/16 0500)   . norepinephrine (LEVOPHED) infusion 7 mcg/min (12/15/16 0520)   . propofol 20 mcg/kg/min (12/15/16 0500)      acetaminophen 650 mg Q6H PRN   albuterol-ipratropium 3 mL Q4H PRN   calcium GLUConate 1 g PRN   dextrose 15 g of glucose PRN   And     dextrose 12.5 g PRN   And     glucagon (rDNA) 1 mg PRN   fentaNYL (PF) 50 mcg Q1H PRN   magnesium sulfate 1 g PRN   ondansetron 4 mg Q8H PRN   potassium chloride 10 mEq PRN   sodium phosphate IVPB 15 mmol PRN   sodium phosphate IVPB 25 mmol PRN   sodium phosphate IVPB 35 mmol PRN         Dhyan Noah A. Janalyn Rouse, M.D.  12/15/2016  6:34 AM

## 2016-12-16 ENCOUNTER — Inpatient Hospital Stay: Payer: Medicare Other

## 2016-12-16 LAB — CBC AND DIFFERENTIAL
Absolute NRBC: 0 10*3/uL
Basophils Absolute Automated: 0.03 10*3/uL (ref 0.00–0.20)
Basophils Automated: 0.3 %
Eosinophils Absolute Automated: 0.43 10*3/uL (ref 0.00–0.70)
Eosinophils Automated: 4.7 %
Hematocrit: 26.3 % — ABNORMAL LOW (ref 42.0–52.0)
Hgb: 8.5 g/dL — ABNORMAL LOW (ref 13.0–17.0)
Immature Granulocytes Absolute: 0.04 10*3/uL
Immature Granulocytes: 0.4 %
Lymphocytes Absolute Automated: 1.15 10*3/uL (ref 0.50–4.40)
Lymphocytes Automated: 12.5 %
MCH: 27.2 pg — ABNORMAL LOW (ref 28.0–32.0)
MCHC: 32.3 g/dL (ref 32.0–36.0)
MCV: 84.3 fL (ref 80.0–100.0)
MPV: 12.5 fL — ABNORMAL HIGH (ref 9.4–12.3)
Monocytes Absolute Automated: 1 10*3/uL (ref 0.00–1.20)
Monocytes: 10.9 %
Neutrophils Absolute: 6.53 10*3/uL (ref 1.80–8.10)
Neutrophils: 71.2 %
Nucleated RBC: 0 /100 WBC (ref 0.0–1.0)
Platelets: 80 10*3/uL — ABNORMAL LOW (ref 140–400)
RBC: 3.12 10*6/uL — ABNORMAL LOW (ref 4.70–6.00)
RDW: 16 % — ABNORMAL HIGH (ref 12–15)
WBC: 9.18 10*3/uL (ref 3.50–10.80)

## 2016-12-16 LAB — HEMOGLOBIN AND HEMATOCRIT, BLOOD
Hematocrit: 26.9 % — ABNORMAL LOW (ref 42.0–52.0)
Hematocrit: 27.3 % — ABNORMAL LOW (ref 42.0–52.0)
Hgb: 8.7 g/dL — ABNORMAL LOW (ref 13.0–17.0)
Hgb: 9 g/dL — ABNORMAL LOW (ref 13.0–17.0)

## 2016-12-16 LAB — GLUCOSE WHOLE BLOOD - POCT
Whole Blood Glucose POCT: 103 mg/dL — ABNORMAL HIGH (ref 70–100)
Whole Blood Glucose POCT: 103 mg/dL — ABNORMAL HIGH (ref 70–100)
Whole Blood Glucose POCT: 103 mg/dL — ABNORMAL HIGH (ref 70–100)
Whole Blood Glucose POCT: 104 mg/dL — ABNORMAL HIGH (ref 70–100)
Whole Blood Glucose POCT: 111 mg/dL — ABNORMAL HIGH (ref 70–100)
Whole Blood Glucose POCT: 77 mg/dL (ref 70–100)
Whole Blood Glucose POCT: 79 mg/dL (ref 70–100)
Whole Blood Glucose POCT: 83 mg/dL (ref 70–100)
Whole Blood Glucose POCT: 84 mg/dL (ref 70–100)
Whole Blood Glucose POCT: 91 mg/dL (ref 70–100)
Whole Blood Glucose POCT: 94 mg/dL (ref 70–100)
Whole Blood Glucose POCT: 98 mg/dL (ref 70–100)
Whole Blood Glucose POCT: 99 mg/dL (ref 70–100)

## 2016-12-16 LAB — BASIC METABOLIC PANEL
Anion Gap: 7 (ref 5.0–15.0)
BUN: 20 mg/dL (ref 9.0–28.0)
CO2: 28 mEq/L (ref 22–29)
Calcium: 7.6 mg/dL — ABNORMAL LOW (ref 7.9–10.2)
Chloride: 102 mEq/L (ref 100–111)
Creatinine: 0.6 mg/dL — ABNORMAL LOW (ref 0.7–1.3)
Glucose: 88 mg/dL (ref 70–100)
Potassium: 3.5 mEq/L (ref 3.5–5.1)
Sodium: 137 mEq/L (ref 136–145)

## 2016-12-16 LAB — PHOSPHORUS: Phosphorus: 2.6 mg/dL (ref 2.3–4.7)

## 2016-12-16 LAB — MAGNESIUM
Magnesium: 1.6 mg/dL (ref 1.6–2.6)
Magnesium: 1.8 mg/dL (ref 1.6–2.6)

## 2016-12-16 LAB — PT AND APTT
PT INR: 1.2 — ABNORMAL HIGH (ref 0.9–1.1)
PT: 15.7 s — ABNORMAL HIGH (ref 12.6–15.0)
PTT: 36 s (ref 23–37)

## 2016-12-16 LAB — GFR: EGFR: >60

## 2016-12-16 LAB — VANCOMYCIN, TROUGH: Vancomycin Trough: 9.5 ug/mL — ABNORMAL LOW (ref 10.0–20.0)

## 2016-12-16 LAB — HEMOLYSIS INDEX
Hemolysis Index: 0 (ref 0–18)
Hemolysis Index: 6 (ref 0–18)

## 2016-12-16 LAB — POTASSIUM: Potassium: 3.8 mEq/L (ref 3.5–5.1)

## 2016-12-16 MED ORDER — VANCOMYCIN HCL 1000 MG IV SOLR
1500.0000 mg | Freq: Once | INTRAVENOUS | Status: AC
Start: 2016-12-16 — End: 2016-12-17
  Administered 2016-12-16: 1500 mg via INTRAVENOUS
  Filled 2016-12-16: qty 1500

## 2016-12-16 MED ORDER — POTASSIUM CHLORIDE 20 MEQ/50ML IV SOLN
20.0000 meq | INTRAVENOUS | Status: DC | PRN
Start: 2016-12-16 — End: 2016-12-27
  Administered 2016-12-16 – 2016-12-27 (×15): 20 meq via INTRAVENOUS
  Filled 2016-12-16 (×11): qty 50
  Filled 2016-12-16: qty 150
  Filled 2016-12-16: qty 50

## 2016-12-16 MED ORDER — SODIUM CHLORIDE 0.9 % IV SOLN
1.0000 g | INTRAVENOUS | Status: DC | PRN
Start: 2016-12-16 — End: 2016-12-27

## 2016-12-16 MED ORDER — DEXTROSE 10 % IV SOLN
INTRAVENOUS | Status: DC
Start: 2016-12-16 — End: 2016-12-17

## 2016-12-16 MED ORDER — DEXTROSE-SODIUM CHLORIDE 5-0.9 % IV SOLN
INTRAVENOUS | Status: DC
Start: 2016-12-16 — End: 2016-12-16

## 2016-12-16 MED ORDER — SODIUM PHOSPHATES 3 MMOLE/ML IV SOLN (WRAP)
15.0000 mmol | INTRAVENOUS | Status: DC | PRN
Start: 2016-12-16 — End: 2016-12-27

## 2016-12-16 MED ORDER — SODIUM PHOSPHATES 3 MMOLE/ML IV SOLN (WRAP)
35.0000 mmol | INTRAVENOUS | Status: DC | PRN
Start: 2016-12-16 — End: 2016-12-27

## 2016-12-16 MED ORDER — SODIUM PHOSPHATES 3 MMOLE/ML IV SOLN (WRAP)
25.0000 mmol | INTRAVENOUS | Status: DC | PRN
Start: 2016-12-16 — End: 2016-12-27

## 2016-12-16 MED ORDER — MAGNESIUM SULFATE IN D5W 1-5 GM/100ML-% IV SOLN
1.0000 g | INTRAVENOUS | Status: DC | PRN
Start: 2016-12-16 — End: 2016-12-27
  Administered 2016-12-17 – 2016-12-27 (×9): 1 g via INTRAVENOUS
  Filled 2016-12-16: qty 200
  Filled 2016-12-16 (×4): qty 100
  Filled 2016-12-16: qty 200
  Filled 2016-12-16: qty 100

## 2016-12-16 MED ORDER — DEXTROSE 10 % IV SOLN
INTRAVENOUS | Status: AC
Start: 2016-12-16 — End: 2016-12-16

## 2016-12-16 MED ORDER — PANTOPRAZOLE SODIUM 40 MG IV SOLR
40.0000 mg | Freq: Two times a day (BID) | INTRAVENOUS | Status: DC
Start: 2016-12-16 — End: 2016-12-17
  Administered 2016-12-16 – 2016-12-17 (×2): 40 mg via INTRAVENOUS
  Filled 2016-12-16 (×3): qty 40

## 2016-12-16 NOTE — Plan of Care (Signed)
Problem: Inadequate Cardiac Output  Goal: Adequate tissue perfusion will be maintained  Outcome: Progressing   12/16/16 1718   Goal/Interventions addressed this shift   Adequate tissue perfusion will be maintained Monitor/assess vital signs;Monitor/assess lab values and report abnormal values;Monitor intake and output;Monitor/assess for signs of VTE (edema of calf/thigh redness, pain);Monitor for signs and symptoms of a pulmonary embolism (dyspnea, tachypnea, tachycardia, confusion);VTE Prevention: Administer anticoagulant(s) and/or apply anti-embolism stockings/devices as ordered;Position patient for maximum circulation/cardiac output;Increase mobility as tolerated/progressive mobility;Perform active/passive ROM;Assess and monitor skin integrity;Provide wound/skin care;Place shoes or other foot protection on patient       Comments: Pt remains sedated, sedation vacation done. Pt follows commands, son at bedside with family. Will get speech to look at.  Continue abx, electrolyte protocol  Will continue to monitor

## 2016-12-16 NOTE — Progress Notes (Signed)
ICU Daily Progress Note                                                  Gean Quint MD, [place], CMD                                                             [phone]    Date Time: 12/16/16 1:04 PM  Patient Name: [name],[name] 83 y.o. male admitted with <principal problem not specified>  Attending Physician: Gean Quint, MD  Room: [phone]   Admit Date: 12/14/2016  LOS: 2 days    Patient status: Inpatient  Hospital Day: 2   Seen/examined by dr Vilma Prader bagla nP  Assessment:   Acute respiratory failure hypercarbic, on vent with tracheostomy, continues  Pneumonia, healthcare associated, proc 0.9, sputum GNR and providencia  Septic shock, on low dose pressor  UTI, pseudomonas pansensitive  Fever related to sepsis, continues low grade  Bilateral pleural effusion  Hypoglycemia, sugars low normal on D10 infusion  Acute GIB with melanotic stool with clots on 12/15/16, no further bleeding  Atrial fibrillation on xarelto outpatient, now holding  HTN by hx  Hx c spine injury with trach/peg  Anemia, stable  Coagulopathy, elevated INR from xarelto, corrected post FFP 1/618  Leukocytosis, persistent  Acute thrombocytopenia, likely relatd to sepsis, improving, s/p platelet infusion 12/15/16  Elevated TSH  Mild hematuria  ICU PROBLEM LIST =septic shock resp failure, pna, UTI, GIB,   Plan:   Vent support continue  Antibiotics per ID, continues  Follow cultures  Vasopressor for bp support, may be able to wean off today, on low dose  IV fluids continue D10 for support of sugars  GI following  continue protonix IV, changed to bid dosing from infusion  H/h every 6 hours  Transfuse prn  Am labs, cxr  Full Thyroid function test  Replace electrolytes per protocol  Speech for speaking valve, eventual, pt usually oriented and uses valve at [facility]  Monitor hematuria, pt has UTI  dvt proph: no meds sec to gib and low platelets  Gi proph: protonix IV    D/w RN, resp  Code Status: full code      Subjective:     + low  grade fever, almost off levophed, no further bleeding overnight        Medications:      Scheduled Meds: PRN Meds:        amiodarone 200 mg Oral Daily   atorvastatin 20 mg Oral QHS   budesonide 0.5 mg Nebulization BID   ciprofloxacin 1 drop Right Eye Q2H WA   glycopyrrolate 1 mg Oral Daily   meropenem 500 mg Intravenous Q6H   metoclopramide 5 mg Oral TID AC   mirtazapine 15 mg Oral QHS   multivitamin 1 tablet Oral Daily   senna 8.8 mg Oral QHS   sodium chloride (PF) 3 mL Intravenous Q8H   vancomycin 1,500 mg Intravenous Q24H       Continuous Infusions:  . dextrose 50 mL/hr at 12/16/16 0700   . FentaNYL-NaCl (PF) 40 mcg/hr (12/16/16 0708)   . norepinephrine (LEVOPHED) infusion Stopped (12/16/16 0854)   .  pantoprozole (PROTONIX) infusion 8 mg/hr (12/16/16 0941)   . propofol 14.989 mcg/kg/min (12/16/16 0700)      sodium chloride  PRN   acetaminophen 650 mg Q6H PRN   albuterol-ipratropium 3 mL Q4H PRN   calcium GLUConate 1 g PRN   dextrose 15 g of glucose PRN   And     dextrose 12.5 g PRN   And     glucagon (rDNA) 1 mg PRN   fentaNYL (PF) 50 mcg Q1H PRN   magnesium sulfate 1 g PRN   ondansetron 4 mg Q8H PRN   potassium chloride 10 mEq PRN   sodium phosphate IVPB 15 mmol PRN   sodium phosphate IVPB 25 mmol PRN   sodium phosphate IVPB 35 mmol PRN         Review of Systems:       Sedated on vent, cannot give ros    Physical Exam:       General Appearance:  Sedated on vent, BUT per nurse follows commands when sedation is lifted  Eyes; perrla  ENT;  Moist membranes  Neuro: sedated, per nurse earlier following commands  Neck: trach  Chest: coarse bilaterally  Heart: reg rate, irregular rhythm  Abdomen:  Mild distention, +BS +Gtube  Extremities:  No edema  Skin:   Pale coloration  GU:   Foley clear yellow  Left groin tlc noted  Capillary refill time - < 3 sec         Data:     Invasive ICU Hemodynamics:         Art Line  Arterial Line BP: 131/64  Arterial Line MAP (mmHg): 88 mmHg  Pulse rate: 88 mmHg           IBW/kg  (Calculated) : 73    ABGs:     Recent Labs  Lab 12/14/16  1958   ABG CollectionSite Art Line   Allen's Test No ART. LINE   pH, Arterial 7.495*   pCO2, Arterial 46.0*   pO2, Arterial 141.0*   HCO3, Arterial 35.2*   Base Excess, Arterial 10.8*   O2 Sat, Arterial 99.1   FIO2 60   Rate 20   Mode: PRVC   PEEP 8   Tidal vol. 500       Vent Settings:  IBW: Vent Settings  Vent Mode: PRVC  FiO2: 30 %  Resp Rate (Set): 20  Vt (Set, mL): 500 mL  PIP Observed (cm H2O): 28 cm H2O  PEEP/EPAP: 5 cm H20  Mean Airway Pressure: 12 cmH20    Patient Lines/Drains/Airways Status    Active PICC Line / CVC Line / PIV Line / Drain / Airway / Intraosseous Line / Epidural Line / ART Line / Line / Wound / Pressure Ulcer / NG/OG Tube     Name:   Placement date:   Placement time:   Site:   Days:    CVC Triple Lumen 12/14/16 Left Femoral  12/14/16    2100    Femoral    less than 1    Peripheral IV 12/14/16 Right Antecubital  12/14/16    1205    Antecubital    1    Gastrostomy/Enterostomy Gastrostomy RUQ          RUQ        Urethral Catheter Temperature probe 16 Fr.  12/14/16    2300    Temperature probe    less than 1    Surgical Airway Shiley 6 mm Cuffed;Long  12/14/16  1855    6 mm    less than 1    Arterial Line 12/14/16 Left Femoral  12/14/16    2100    Femoral    less than 1    Wound 12/14/16 Pressure Injury Sacrum Red open area with drainage  12/14/16    1600    Sacrum    less than 1              VITAL SIGNS   Temp:  [97.6 F (36.4 C)-100.4 F (38 C)] 99.9 F (37.7 C)  Heart Rate:  [88-109] 88  Resp Rate:  [2-27] 20  BP: (108-137)/(51-90) 126/59  Arterial Line BP: (100-154)/(47-69) 131/64  FiO2:  [30 %-100 %] 30 %  Pulse ox: 99    Intake/Output Summary (Last 24 hours) at 12/16/16 1304  Last data filed at 12/16/16 0729   Gross per 24 hour   Intake          3459.04 ml   Output             1545 ml   Net          1914.04 ml        Labs:     CBC w/Diff CMP     Recent Labs  Lab 12/16/16  0811 12/16/16  0223 12/15/16  1841 12/15/16  1231  12/15/16  0428 12/14/16  1207   WBC  --  9.18  --  16.51* 16.23* 19.29*   Hgb 8.7* 8.5* 8.8* 9.9* 9.5* 11.2*   Hematocrit 26.9* 26.3* 27.6* 30.8* 30.2* 37.9*   Platelets  --  80*  --  54* 39* 299   MCV  --  84.3  --  84.8 86.3 92.0   Neutrophils  --  71.2  --   --   --  77.4   Segmented Neutrophils  --   --   --   --  81  --        PT/INR     Recent Labs  Lab 12/16/16  0223 12/15/16  0428 12/14/16  1552   PT INR 1.2* 1.7* 1.3*         Recent Labs  Lab 12/16/16  0223 12/15/16  0428 12/14/16  1207   Sodium 137 138 138   Potassium 3.5 3.7 5.1   Chloride 102 97* 87*   CO2 28 32* 42*   BUN 20.0 24.0 30.0*   Creatinine 0.6* 0.6* 0.8   Glucose 88 91 119*   Calcium 7.6* 7.8* 8.6   Magnesium 1.6  --  2.0   Phosphorus 2.6  --  3.7   Protein, Total  --   --  5.8*   Albumin  --   --  2.1*   AST (SGOT)  --   --  16   ALT  --   --  17   Alkaline Phosphatase  --   --  65   Bilirubin, Total  --   --  0.5      Glucose POCT     Recent Labs  Lab 12/16/16  0223 12/15/16  0428 12/14/16  1207 12/12/16  0530   Glucose 88 91 119* 100          Recent Labs  Lab 12/14/16  1552 12/14/16  1207   Troponin I <0.01 <0.01       Urinalysis    Recent Labs  Lab 12/15/16  0429  12/14/16  0445   Urine Type Catheterized, F  More results in Results Review Random   Color, UA Yellow More results in Results Review YELLOW   Clarity, UA Hazy More results in Results Review CLOUDY*   Specific Gravity UA 1.020 More results in Results Review 1.018   Urine pH 6.0 More results in Results Review 7.0   Nitrite, UA Negative More results in Results Review NEGATIVE   Ketones UA 80 More results in Results Review NEGATIVE   Urobilinogen, UA Negative More results in Results Review 0.2   Bilirubin, UA Negative More results in Results Review NEGATIVE   Blood, UA Negative More results in Results Review NEGATIVE   RBC, UA 3 - 5 More results in Results Review 0-2   WBC, UA TNTC* More results in Results Review TNTC*   Urine Bacteria  --   --  NONE   More results in Results Review  = values in this interval not displayed.    Rads:   Xr Chest Ap Portable    Result Date: 12/16/2016   No significant change with right larger than left pleural effusions and bibasilar atelectasis versus airspace disease.Filbert Schilder, MD 12/16/2016 8:30 AM          I have personally reviewed the patient's history and 24 hour interval events, along with vitals, labs, radiology images and  ventilator settings detail.     So far today I have spent 40 minutes providing care for this patient excluding teaching and billable procedures, and not overlapping with any other providers.:         Gean Quint, MD     12/16/16 1:04 PM

## 2016-12-16 NOTE — Plan of Care (Signed)
Problem: Inadequate Gas Exchange  Goal: Adequate oxygenation and improved ventilation  Outcome: Progressing  The lungs are diminished, rhonchi to all lobes bilat., sputum is thick yellow-tan to bloody tinged --small amounts. Tolerating PRVC, R-20, TV-500, PEEP-5, FiO2 30 with sats above 95. Will continue to assess lungs every 4 hours with good mouth care and continuously monitoring O2 sats.     12/16/16 0981   Goal/Interventions addressed this shift   Adequate oxygenation and improved ventilation Assess lung sounds;Monitor SpO2 and treat as needed;Provide mechanical and oxygen support to facilitate gas exchange;Position for maximum ventilatory efficiency;Plan activities to conserve energy: plan rest periods;Increase activity as tolerated/progressive mobility;Consult/collaborate with Respiratory Therapy        Problem: Compromised Hemodynamic Status  Goal: Vital signs and fluid balance maintained/improved  Outcome: Progressing  A-fib rate 80-90, titrated levophed to MAP of 65. Levophed currently at 3 mcg/min.  Urine output over night.  Will continuously monitor Hr, MAP, and titrated levo for a MAP of 65.    12/16/16 0834   Goal/Interventions addressed this shift   Vital signs and fluid balance are maintained/improved Position patient for maximum circulation/cardiac output;Monitor/assess vitals and hemodynamic parameters with position changes;Monitor and compare daily weight;Monitor intake and output. Notify LIP if urine output is less than 30 mL/hour.;Monitor/assess lab values and report abnormal values

## 2016-12-16 NOTE — Progress Notes (Signed)
1/2 GPC clusters in blood from 1/5.  Temps up a bit, but <100.  Wbc now nl.  Procalcitonin up slightly.  Was on vancomycin, last dose yesterday, trough today just outside rx.    Will give one more dose of vanc until final results in, but suspect contam.  Pt. Still on meropenem.

## 2016-12-16 NOTE — Progress Notes (Signed)
Infectious Diseases & Tropical Medicine  Progress Note    12/16/2016   Euan Wandler ZOX:[id number],[id number] is a 83 y.o. male,       Assessment:      Sepsis.   Chronic respiratory failure with tracheostomy   Bilateral pneumonia   Chest x-ray-bilateral pleural effusions, right more than left (12/16/2016)   Pro-calcitonin level-0.9(12/14/2016)   Sputum culture-2 different gram-negative rods, awaiting identification.   Urine culture -pansensitive pseudomonas   Hypertension.   Chronic kidney disease.   Leukocytosis resolved-still with persistent fever   Resident of [name]    Plan:      Continue meropenem until sputum cultures are finalized   Discontinue vancomycin   May need thoracentesis   Await final sputum cultures.   Repeat pro-calcitonin level   Monitor electrolytes and renal functions closely.   Monitor clinically    ROS:     General:  persistent fever, no chills,unresponsive  HEENT: intubated and sedated  Respiratory: no cough, shortness of breath, or wheezing   Gastrointestinal: dark-colored stools  Genito-Urinary: no hematuria  Musculoskeletal: no edema  Neurological: sedated  Dermatological: no rash, no ulcer    Physical Examination:     Blood pressure 126/59, pulse 88, temperature 99.9 F (37.7 C), resp. rate 20, height 1.778 m (5\' 10" ), weight 107.7 kg (237 lb 7 oz), SpO2 99 %.     General Appearance: Comfortable, and in no acute distress.   HEENT: Pupils are equal, round, and reactive to light.    Lungs:  Decreased breath sounds   Heart:  Regular rate and rhythm   Chest: Symmetric chest wall expansion.    Abdomen: soft,no hepatosplenomegaly,good bowel sounds,G-tube in place   Neurological: unresponsive   Extremities:  edema    Laboratory And Diagnostic Studies:     Recent Labs      12/16/16   0811  12/16/16   0223   12/15/16   1231   WBC   --   9.18   --   16.51*   Hgb  8.7*  8.5*   < >  9.9*   Hematocrit  26.9*  26.3*   < >  30.8*   Platelets   --   80*   --    54*    < > = values in this interval not displayed.     Recent Labs      12/16/16   0223  12/15/16   0428   Sodium  137  138   Potassium  3.5  3.7   Chloride  102  97*   CO2  28  32*   BUN  20.0  24.0   Creatinine  0.6*  0.6*   Glucose  88  91   Calcium  7.6*  7.8*     Recent Labs      12/14/16   1207   AST (SGOT)  16   ALT  17   Alkaline Phosphatase  65   Protein, Total  5.8*   Albumin  2.1*       Current Meds:      Scheduled Meds: PRN Meds:        amiodarone 200 mg Oral Daily   amLODIPine 5 mg Oral Daily   atorvastatin 20 mg Oral QHS   budesonide 0.5 mg Nebulization BID   ciprofloxacin 1 drop Right Eye Q2H WA   glycopyrrolate 1 mg Oral Daily   meropenem 500 mg Intravenous Q6H   metoclopramide 5 mg Oral TID AC  metoprolol tartrate 25 mg Oral Q12H SCH   mirtazapine 15 mg Oral QHS   multivitamin 1 tablet Oral Daily   senna 8.8 mg Oral QHS   sodium chloride (PF) 3 mL Intravenous Q8H   vancomycin 1,500 mg Intravenous Q24H       Continuous Infusions:  . dextrose 50 mL/hr at 12/16/16 0700   . FentaNYL-NaCl (PF) 40 mcg/hr (12/16/16 0708)   . norepinephrine (LEVOPHED) infusion Stopped (12/16/16 0854)   . pantoprozole (PROTONIX) infusion 8 mg/hr (12/16/16 0941)   . propofol 14.989 mcg/kg/min (12/16/16 0700)      sodium chloride  PRN   acetaminophen 650 mg Q6H PRN   albuterol-ipratropium 3 mL Q4H PRN   calcium GLUConate 1 g PRN   dextrose 15 g of glucose PRN   And     dextrose 12.5 g PRN   And     glucagon (rDNA) 1 mg PRN   fentaNYL (PF) 50 mcg Q1H PRN   magnesium sulfate 1 g PRN   ondansetron 4 mg Q8H PRN   potassium chloride 10 mEq PRN   sodium phosphate IVPB 15 mmol PRN   sodium phosphate IVPB 25 mmol PRN   sodium phosphate IVPB 35 mmol PRN         Ah Bott A. Janalyn Rouse, M.D.  12/16/2016  11:10 AM

## 2016-12-16 NOTE — Progress Notes (Signed)
Progress Note  [facility] [phone]    12/16/16      Assessment:  Sepsis- likely due to pneumonia  Thrombocytopenia- 299-->39  Rectal Bleeding-  H/H 8.7<--9.9<--9.5, at admission 11.2, baseline appears 10-11, unclear etiology lower vs upper bleed GI bleeding, if persists consider PEG tube lavage  H/o C spine fracture  Abd xray-suspect ileus    [name] is a 83 y.o. male  PMH of trach and PEG dependent, C spine fracture, CKD, HTN,  A fib on Xarelto, admitted from [facility] with septic shock, dx with pneumonia currently intubated and sedated developed. Pt had 1 episode rectal bleeding this 12/15/16 and have developed acute thrombocytopenia.  Overall H/H stable and not significantly changed from baseline.  Plan for supportive care and hold off on urgent endoscopic procedure.Noted worsening abd distention and will check abd xray    Plan:  Abd xray  Sepsis management per ICU team  Follow H and H, transfuse prn  Monitor for signs of bleeding  Change PPI BID  Consider resuming TFs if xray negative        This case will be discussed with Dr. Andrey Campanile.  [facility] at [place], [place]   10:53 AM    Subjective:  Pt still intubated and sedated.  Requiring intermittent pressors.  No further episodes of GI bleeding since 1 episode yesterday. TFs currently on hold.  Worse abd distention today.     Objective:    Current Facility-Administered Medications   Medication Dose Route Frequency Last Rate Last Dose   . 0.9%  NaCl infusion   Intravenous PRN       . acetaminophen (TYLENOL) suppository 650 mg  650 mg Rectal Q6H PRN       . albuterol-ipratropium (DUO-NEB) 2.5-0.5(3) mg/3 mL nebulizer 3 mL  3 mL Nebulization Q4H PRN   3 mL at 12/15/16 0027   . amiodarone (PACERONE) tablet 200 mg  200 mg Oral Daily   200 mg at 12/16/16 1004   . amLODIPine (NORVASC) tablet 5 mg  5 mg Oral Daily   5 mg at 12/14/16 1657   . atorvastatin (LIPITOR) tablet 20 mg  20 mg Oral QHS   20 mg at 12/15/16 2332   . budesonide (PULMICORT) 0.5 MG/2ML  nebulizer solution 0.5 mg  0.5 mg Nebulization BID   0.5 mg at 12/16/16 0748   . calcium GLUConate 1 g in sodium chloride 0.9 % 100 mL IVPB  1 g Intravenous PRN       . ciprofloxacin (CILOXAN) 0.3 % ophthalmic solution 1 drop  1 drop Right Eye Q2H WA   1 drop at 12/16/16 0852   . dextrose (GLUCOSE) 40 % oral gel 15 g of glucose  15 g of glucose Oral PRN        And   . dextrose 50 % bolus 12.5 g  12.5 g Intravenous PRN        And   . glucagon (rDNA) (GLUCAGEN) injection 1 mg  1 mg Intramuscular PRN       . dextrose 10 % infusion   Intravenous Continuous 50 mL/hr at 12/16/16 0700     . fentaNYL (PF) (SUBLIMAZE) injection 50 mcg  50 mcg Intravenous Q1H PRN   50 mcg at 12/14/16 1916   . FentaNYL-NaCl (PF) 1000 mcg in sodium chloride 0.9% infusion 100 mL  12.5-200 mcg/hr Intravenous Continuous 4 mL/hr at 12/16/16 0708 40 mcg/hr at 12/16/16 0708   . glycopyrrolate (ROBINUL) tablet 1 mg  1 mg Oral  Daily   1 mg at 12/16/16 1010   . magnesium sulfate 1g in dextrose 5% IVPB (premix)  1 g Intravenous PRN 100 mL/hr at 12/16/16 0516 1 g at 12/16/16 0516   . meropenem (MERREM) 500 mg in sodium chloride 0.9 % 100 mL IVPB mini-bag plus  500 mg Intravenous Q6H 200 mL/hr at 12/16/16 1018 500 mg at 12/16/16 1018   . metoclopramide (REGLAN) tablet 5 mg  5 mg Oral TID AC   5 mg at 12/15/16 1630   . metoprolol tartrate (LOPRESSOR) tablet 25 mg  25 mg Oral Q12H SCH   25 mg at 12/15/16 2332   . mirtazapine (REMERON) tablet 15 mg  15 mg Oral QHS   15 mg at 12/15/16 2332   . multivitamin (MULTIVITAMIN) tablet 1 tablet  1 tablet Oral Daily   1 tablet at 12/16/16 1004   . norepinephrine (LEVOPHED) 8 mg in dextrose 5% 100 mL infusion  1-20 mcg/min Intravenous Continuous   Stopped at 12/16/16 0854   . ondansetron (ZOFRAN) injection 4 mg  4 mg Intravenous Q8H PRN       . pantoprazole (PROTONIX) 80 mg in sodium chloride 0.9 % 100 mL infusion  8 mg/hr Intravenous Continuous 10 mL/hr at 12/16/16 0941 8 mg/hr at 12/16/16 0941   . potassium  chloride 10 mEq in 100 mL IVPB (premix)  10 mEq Intravenous PRN   10 mEq at 12/16/16 0729   . propofol (DIPRIVAN) 10 mg/mL infusion (ADULT)  5-50 mcg/kg/min Intravenous Continuous 9.2 mL/hr at 12/16/16 0700 14.989 mcg/kg/min at 12/16/16 0700   . sennosides (SENOKOT) syrup 8.8 mg  8.8 mg Oral QHS   8.8 mg at 12/15/16 2332   . sodium chloride (PF) 0.9 % flush 3 mL  3 mL Intravenous Q8H   3 mL at 12/16/16 0516   . sodium phosphate 15 mmol in dextrose 5 % 250 mL IVPB  15 mmol Intravenous PRN       . sodium phosphate 25 mmol in dextrose 5 % 250 mL IVPB  25 mmol Intravenous PRN       . sodium phosphate 35 mmol in dextrose 5 % 250 mL IVPB  35 mmol Intravenous PRN       . vancomycin (VANCOCIN) 1,500 mg in sodium chloride 0.9 % 500 mL IVPB  1,500 mg Intravenous Q24H 250 mL/hr at 12/15/16 1457 1,500 mg at 12/15/16 1457       Physical Exam:  BP 126/59   Pulse 88   Temp 99.9 F (37.7 C)   Resp 20   Ht 1.778 m (5\' 10" )   Wt 107.7 kg (237 lb 7 oz)   SpO2 99%   BMI 34.07 kg/m     General Appearance: NAD, comfortable, intubated and sedated  Abd: soft, NT, distended but soft, PEG site with bumper closely adhered to skin        Recent Labs  Lab 12/16/16  0811 12/16/16  0223 12/15/16  1841 12/15/16  1231 12/15/16  0428 12/14/16  1207   WBC  --  9.18  --  16.51* 16.23* 19.29*   Hgb 8.7* 8.5* 8.8* 9.9* 9.5* 11.2*   Hematocrit 26.9* 26.3* 27.6* 30.8* 30.2* 37.9*   Platelets  --  80*  --  54* 39* 299   MCV  --  84.3  --  84.8 86.3 92.0   Neutrophils  --  71.2  --   --   --  77.4   Segmented Neutrophils  --   --   --   --  81  --        Recent Labs  Lab 12/16/16  0223 12/15/16  0428 12/14/16  1207   Sodium 137 138 138   Potassium 3.5 3.7 5.1   Chloride 102 97* 87*   CO2 28 32* 42*   BUN 20.0 24.0 30.0*   Creatinine 0.6* 0.6* 0.8   Glucose 88 91 119*   Calcium 7.6* 7.8* 8.6   Magnesium 1.6  --  2.0   Phosphorus 2.6  --  3.7   Protein, Total  --   --  5.8*   Albumin  --   --  2.1*   AST (SGOT)  --   --  16   ALT  --   --  17      Alkaline Phosphatase  --   --  65   Bilirubin, Total  --   --  0.5     Glucose:    Recent Labs  Lab 12/16/16  0223 12/15/16  0428 12/14/16  1207 12/12/16  0530   Glucose 88 91 119* 100       Recent Labs  Lab 12/16/16  0223 12/15/16  0428 12/14/16  1552   PT 15.7* 20.2* 16.2*   PT INR 1.2* 1.7* 1.3*   PTT 36 52* 37

## 2016-12-17 ENCOUNTER — Inpatient Hospital Stay: Payer: Medicare Other

## 2016-12-17 LAB — TSH: TSH: 3.95 u[IU]/mL (ref 0.35–4.94)

## 2016-12-17 LAB — CBC AND DIFFERENTIAL
Absolute NRBC: 0 10*3/uL
Basophils Absolute Automated: 0.02 10*3/uL (ref 0.00–0.20)
Basophils Automated: 0.2 %
Eosinophils Absolute Automated: 0.43 10*3/uL (ref 0.00–0.70)
Eosinophils Automated: 4.1 %
Hematocrit: 27.9 % — ABNORMAL LOW (ref 42.0–52.0)
Hgb: 9.1 g/dL — ABNORMAL LOW (ref 13.0–17.0)
Immature Granulocytes Absolute: 0.07 10*3/uL — ABNORMAL HIGH
Immature Granulocytes: 0.7 %
Lymphocytes Absolute Automated: 0.75 10*3/uL (ref 0.50–4.40)
Lymphocytes Automated: 7.1 %
MCH: 27.4 pg — ABNORMAL LOW (ref 28.0–32.0)
MCHC: 32.6 g/dL (ref 32.0–36.0)
MCV: 84 fL (ref 80.0–100.0)
MPV: 12.4 fL — ABNORMAL HIGH (ref 9.4–12.3)
Monocytes Absolute Automated: 0.8 10*3/uL (ref 0.00–1.20)
Monocytes: 7.6 %
Neutrophils Absolute: 8.43 10*3/uL — ABNORMAL HIGH (ref 1.80–8.10)
Neutrophils: 80.3 %
Nucleated RBC: 0 /100 WBC (ref 0.0–1.0)
Platelets: 98 10*3/uL — ABNORMAL LOW (ref 140–400)
RBC: 3.32 10*6/uL — ABNORMAL LOW (ref 4.70–6.00)
RDW: 16 % — ABNORMAL HIGH (ref 12–15)
WBC: 10.5 10*3/uL (ref 3.50–10.80)

## 2016-12-17 LAB — BASIC METABOLIC PANEL
Anion Gap: 6 (ref 5.0–15.0)
BUN: 13 mg/dL (ref 9.0–28.0)
CO2: 23 mEq/L (ref 22–29)
Calcium: 7.4 mg/dL — ABNORMAL LOW (ref 7.9–10.2)
Chloride: 105 mEq/L (ref 100–111)
Creatinine: 0.6 mg/dL — ABNORMAL LOW (ref 0.7–1.3)
Glucose: 110 mg/dL — ABNORMAL HIGH (ref 70–100)
Potassium: 3.9 mEq/L (ref 3.5–5.1)
Sodium: 134 mEq/L — ABNORMAL LOW (ref 136–145)

## 2016-12-17 LAB — PT AND APTT
PT INR: 1.2 — ABNORMAL HIGH (ref 0.9–1.1)
PT: 15 s (ref 12.6–15.0)
PTT: 36 s (ref 23–37)

## 2016-12-17 LAB — PROCALCITONIN: Procalcitonin: <0.1 (ref 0.0–0.1)

## 2016-12-17 LAB — GLUCOSE WHOLE BLOOD - POCT
Whole Blood Glucose POCT: 113 mg/dL — ABNORMAL HIGH (ref 70–100)
Whole Blood Glucose POCT: 114 mg/dL — ABNORMAL HIGH (ref 70–100)
Whole Blood Glucose POCT: 97 mg/dL (ref 70–100)
Whole Blood Glucose POCT: 99 mg/dL (ref 70–100)

## 2016-12-17 LAB — HEMOLYSIS INDEX
Hemolysis Index: 1 (ref 0–18)
Hemolysis Index: 6 (ref 0–18)

## 2016-12-17 LAB — T3, FREE: T3, Free: 1.37 pg/mL — ABNORMAL LOW (ref 1.71–3.71)

## 2016-12-17 LAB — POTASSIUM: Potassium: 3.9 mEq/L (ref 3.5–5.1)

## 2016-12-17 LAB — MAGNESIUM
Magnesium: 1.7 mg/dL (ref 1.6–2.6)
Magnesium: 2.1 mg/dL (ref 1.6–2.6)

## 2016-12-17 LAB — PHOSPHORUS
Phosphorus: 2.5 mg/dL (ref 2.3–4.7)
Phosphorus: 2.5 mg/dL (ref 2.3–4.7)

## 2016-12-17 LAB — T4, FREE: T4 Free: 1.03 ng/dL (ref 0.70–1.48)

## 2016-12-17 LAB — GFR: EGFR: >60

## 2016-12-17 MED ORDER — PANTOPRAZOLE ORAL SUSPENSION 40 MG/20 ML UD
40.0000 mg | Freq: Two times a day (BID) | ORAL | Status: DC
Start: 2016-12-17 — End: 2016-12-21
  Administered 2016-12-17 – 2016-12-21 (×8): 40 mg via ORAL
  Filled 2016-12-17 (×9): qty 20

## 2016-12-17 MED ORDER — FENTANYL CITRATE-NACL 1-0.9 MG/100ML-% IV SOLN
0.0000 ug/h | INTRAVENOUS | Status: DC
Start: 2016-12-17 — End: 2016-12-18
  Administered 2016-12-17 – 2016-12-18 (×2): 75 ug/h via INTRAVENOUS
  Filled 2016-12-17 (×2): qty 100

## 2016-12-17 MED ORDER — LORAZEPAM 2 MG/ML IJ SOLN
0.5000 mg | Freq: Three times a day (TID) | INTRAMUSCULAR | Status: DC | PRN
Start: 2016-12-17 — End: 2016-12-25
  Administered 2016-12-18 – 2016-12-23 (×8): 0.5 mg via INTRAVENOUS
  Filled 2016-12-17 (×8): qty 1

## 2016-12-17 MED ORDER — SENNOSIDES 8.8MG/5ML UNIT DOSE SYRINGE
17.6000 mg | ORAL_SOLUTION | Freq: Every evening | ORAL | Status: DC
Start: 2016-12-17 — End: 2016-12-24
  Administered 2016-12-17 – 2016-12-19 (×3): 17.6 mg via ORAL
  Filled 2016-12-17 (×8): qty 10

## 2016-12-17 MED ORDER — GLYCOPYRROLATE 0.2 MG/ML IJ SOLN
0.2000 mg | Freq: Three times a day (TID) | INTRAMUSCULAR | Status: DC | PRN
Start: 2016-12-17 — End: 2016-12-27

## 2016-12-17 MED ORDER — LORAZEPAM 2 MG/ML IJ SOLN
1.0000 mg | INTRAMUSCULAR | Status: DC | PRN
Start: 2016-12-17 — End: 2016-12-17
  Administered 2016-12-17: 1 mg via INTRAVENOUS
  Filled 2016-12-17: qty 1

## 2016-12-17 NOTE — Plan of Care (Signed)
Problem: Inadequate Gas Exchange  Goal: Patent Airway maintained  Outcome: Progressing   12/17/16 1022   Goal/Interventions addressed this shift   Patent airway maintained  Position patient for maximum ventilatory efficiency;Provide adequate fluid intake to liquefy secretions;Suction secretions as needed;Reposition patient every 2 hours and as needed unless able to self-reposition   Pt., is laying in bed HOB at 30 degrees, Pt's O2 sats are from 98-100 on a vent (trach) -- setting PRVC, FiO2 30, RR 20, Vt 500, PEEP 5. Suctioned thick, bloody secretions form ETT tube.   Pt., on levo at 1 mcg/min and titrated to MAP of 65, HR 80-100.   Propol infusing at 15 mcg/kg/min, pt., responds to voice and/or stimulation.   Upon repositioning pt., in bed pt., grimacing  and appears in pain--Fentanyl titrated from 40-50  Mcg/hr.  Electrolytes replaced per protocol.  D 10 infusing, blood glucose checked every 4 hours.   Will continue to monitor pt., for any changes in condition and titrate medications as ordered.       Problem: Inadequate Airway Clearance  Goal: Normal respiratory rate/effort achieved/maintained  Outcome: Progressing   12/17/16 1022   Goal/Interventions addressed this shift   Normal respiratory rate/effort achieved/maintained Plan activities to conserve energy: plan rest periods   Care is clustered together as much as possible to conserve pt's energy and allow rest.  Will continue to cluster care.    Problem: Artificial Airway  Goal: Tracheostomy will be maintained  Outcome: Progressing   12/17/16 1022   Goal/Interventions addressed this shift   Tracheostomy will be maintained Suction secretions as needed;Keep head of bed at 30 degrees, unless contraindicated;Perform deep oropharyngeal suctioning at least every 4 hours;Apply water-based moisturizer to lips;Utilize tracheostomy securing device;Support ventilator tubing to avoid pressure from drag of tubing;Tracheostomy care every shift and as needed;Maintain  surgical airway kit or tracheostomy tray at bedside;Keep additional tracheostomy tube of the same size and one size smaller at bedside   Suctioned as needed, moderate amount of  thick, bloody secretions suctioned out from ETT tube.  Old bloody drainage noted.Trach care performed, extra trach kit at bed side.   Will continue to monitor for bleeding and trach integrity.

## 2016-12-17 NOTE — Consults (Signed)
Wound Ostomy Continence Consultation    Date Time: 12/17/16 6:14 PM  Patient Name: [name],[name]  Consulting Service: WOCN  Reason for Consult: abscess to right buttock    Assessment & Plan   Assessment     Wound Assessment:    Stage 3 coccyx- POA- full thickness skin loss, wound measure 3 cm x 2.5 cm, Wound bed has 60 % pink and 40 % yellow slough. Peri wound slightly macerated and erythema.       Recommendations/Plan:     Coccyx stage 3- Cleanse wound with normal saline or wound cleanser and allow to dry or pat it dry with gauze.  Apply Thera Honey wound dressing/gel directly to wound.  Therahoney sheet may be cut to size. Protect peri wound with no sting barrier film wipes/spray.   Secure the dressing with a secondary dressing/adhesive foam dressing. Change the dressing DAILY and prn for soiling or contamination.  As indicated by the amount of drainage or as frequently as the secondary indicates.  Reapply Thera Honey as needed.     TheraHoney Sheet dressing/gel promotes autolytic debridement of necrotic tissue.  Medical grade honey has antimicrobial effects on a broad spectrum of viruses, fungi, protozoa and over 50 species of bacteria.     Wound care orders entered into EMR. Care rendered.  Wound care to follow.     Objective Findings   Specialty Bed:    total care sport    Braden Score: Braden Scale Score: 12 (12/17/16 0815)      Wound Assessment:   Wound 12/14/16 Pressure Injury Sacrum Red open area with drainage (Active)   Site Description Red 12/17/2016  8:15 AM   Peri-wound Description Red;Pink 12/17/2016  8:15 AM   Shape Round 12/14/2016  4:00 PM   Drainage Amount Small 12/17/2016  8:15 AM   Drainage Description Sanguinous 12/17/2016  8:15 AM   Treatments Cleansed 12/14/2016  4:00 PM   Dressing ABD;Silicone Adhesive Foam 12/17/2016  8:15 AM   Dressing Changed Changed 12/15/2016 12:00 AM   Dressing Status Clean;Dry;Intact 12/17/2016  8:15 AM       Wound 12/17/16 Skin Tear Arm Left;Lower skin tear (Active)    Peri-wound Description Bleeding 12/17/2016  8:15 AM   Drainage Amount Moderate 12/17/2016  8:15 AM   Drainage Description Serosanguinous 12/17/2016  8:15 AM   Margins Unattached edges 12/17/2016  8:15 AM   Treatments Cleansed;Site care;Other (Comment) 12/17/2016  4:00 AM   Dressing Silicone Adhesive Foam 12/17/2016  8:15 AM   Dressing Changed New 12/17/2016  4:00 AM   Dressing Status Clean;Dry;Intact 12/17/2016  8:15 AM       Wound 12/17/16 Other (Comment) Leg Anterior (Active)   Site Description Red;Pink 12/17/2016  8:15 AM   Peri-wound Description Intact 12/17/2016  8:15 AM   Treatments Cleansed 12/17/2016  8:15 AM   Dressing Open to air 12/17/2016  8:15 AM              History of Presenting Illness   HPI:   [name] is a 83 y.o. male with past medical history significant for C-spine trauma several months ago was in ventilator.  However, over the last 3 weeks he has been tolerating trach collar and is a resident of [name] transferred today because of fever, oxygen desaturation and respiratory distress.  Chest x-ray done in the emergency room showed right pleural effusion with infiltrate.  Patient was in respiratory distress, was placed on ventilator temporarily.  At present he is  on aerosol trach collar seems chronically ill-appearing.  Patient's son and daughter are at the bedside.  Most history is obtained from the    Past Medical and Surgical History     Past Medical History:   Diagnosis Date   . C2 cervical fracture    . Chronic pain    . Conjunctivitis    . Constipation    . Cutaneous abscess of buttock    . Dysphagia    . Hypertension    . Insomnia    . Kidney disorder    . Respiratory arrest        Past Surgical History:   Procedure Laterality Date   . GASTROSTOMY TUBE PLACEMENT     . TRACHEOSTOMY       POD: @ORDAYSPST  @LOS :  LOS: 3 days         Lab   Significant Lab Values:    Recent Labs      12/17/16   1212  12/17/16   0149   WBC   --   10.50   RBC   --   3.32*   Hgb   --   9.1*   Hematocrit   --    27.9*   Sodium   --   134*   Potassium  3.9  3.9   Chloride   --   105   CO2   --   23   BUN   --   13.0   Creatinine   --   0.6*   Calcium   --   7.4*   EGFR   --   >60.0   Glucose   --   110*    Prealbumin: No components found for: PREALBUMIN  Culture   N/A    Thank you for this consultation  [name] "Sunshine" Eliette Drumwright  BSN, RN  [facility]  Wound, Ostomy and Continence Coordinator  [phone] Office  [phone] [employer]

## 2016-12-17 NOTE — Consults (Signed)
[facility]                                                                     Consultation Note    Date Time: 12/17/16 10:06 AM  Patient Name: [name],[name]  Attending Physician: Gean Quint, MD  Referring Physician: Dr. Graciela Husbands  Date of Admission: 12/14/2016  Referral Date: 12/17/2016  Consult Date: 12/17/2016  Referral Unit: ICU    Assessment/Plan:   83 y.o.male with hx of C2 spine fracture, chronic respiratory failure, bed-bound.   --Acute on chronic respiratory failure  --HCAP  --UTI  --Sepsis on pressors and vent.    Palliative Care Performance Score 20%  Estimated Prognosis at time of referral:days to weeks.     Advance Care Plan and Goals of Care:  Has decisional capacity: no  Present:no family present  Advance Directive: no  Code status: Full Code -default  Designated agent: called his children ( son and daughter - ramona and dan respectively) no answer. Left a message with call back contact #    Dyspnea: HCAP. Comfortable on the vent.  --fentanyl drip on board continue same.     AMS with agitation and restlessness.  likely hyperactive delirium 2/2 current infections. Possible component of pain.  Continue to treat .    --would recommend if have to use ativan or similar med, to use very low doses prn 0.5mg  q 8 hrly prn , given patient's very sedative states.   --preferable haldol prn. D/c remeron.     --verbal calming. Re-orientate as possible. Simulate day and night.     Pain: chronic per charts. Continue on fentanyl drip at this time    Excessive secretions:  --continue with robinul.    Constipation prophylaxis while on opioids. Goal is a BM at least QOD  Senna-s 2 tabs QHS  Dulcolax prn.     Disposition:TBD    Reason for Consultation:   Dyspnea, Goals of Care and Altered Mental Status    Primary Diagnosis: acute on chronic respiratory failure 2/2 HCAP    History of Present Illness: from RNRevonda Standard and EMR.    83 y.o.  male with hx of dysphagia, cervical (C-2) spine fracture on the vent. Resident of [facility]. Who was been weaned off the vent and placed on a trach collar. However admitted with acute on chronic respiratory failure 2/2 HCAP and UTI. On pressors and sedation with propofol. Still on the vent. Off propofol was very agitated and restless. Hx of chronic pain. Commenced on fentanyl drip with improvement and a dose of  ativan.  Unclear baseline. Per RN, family says he is usually with it.   Excessive secretions requiring frequent suctioning. Last BM was yesterday. mouth breathing . Dry mouth and scaly lips.       Past Medical History:     Past Medical History:   Diagnosis Date   . C2 cervical fracture    . Chronic pain    . Conjunctivitis    . Constipation    . Cutaneous abscess of buttock    . Dysphagia    . Hypertension    . Insomnia    . Kidney disorder    . Respiratory arrest        Past  Surgical History:     Past Surgical History:   Procedure Laterality Date   . GASTROSTOMY TUBE PLACEMENT     . TRACHEOSTOMY         Family History:   History reviewed. No pertinent family history.    Social History:   Tobacco use: no  Alcohol use: no  Occupation: retired  Marital Status: single  Living Situation: NH  Children: yes  Psychosocial/Spiritual Supports: yes    Allergies:     Allergies   Allergen Reactions   . Dilaudid [Hydromorphone Hcl]        Medications:   Medication list/MAR reviewed.  Current Facility-Administered Medications   Medication Dose Route Frequency   . amiodarone  200 mg Oral Daily   . atorvastatin  20 mg Oral QHS   . budesonide  0.5 mg Nebulization BID   . ciprofloxacin  1 drop Right Eye Q2H WA   . glycopyrrolate  1 mg Oral Daily   . meropenem  500 mg Intravenous Q6H   . metoclopramide  5 mg Oral TID AC   . mirtazapine  15 mg Oral QHS   . multivitamin  1 tablet Oral Daily   . pantoprazole  40 mg Intravenous BID   . senna  8.8 mg Oral QHS   . sodium chloride (PF)  3 mL Intravenous Q8H     As needed  medications include: sodium chloride, acetaminophen, albuterol-ipratropium, calcium GLUConate, Nursing communication: Adult Hypoglycemia Treatment Algorithm **AND** dextrose **AND** dextrose **AND** glucagon (rDNA), fentaNYL (PF), magnesium sulfate, ondansetron, potassium chloride, sodium phosphate IVPB, sodium phosphate IVPB, sodium phosphate IVPB    Review of Systems:   Review of Systems   Unable to perform ROS: Critical illness       Physical Exam:   VS: BP 110/57   Pulse 82   Temp 99 F (37.2 C) (Foley Temp)   Resp 20   Ht 1.778 m (5\' 10" )   Wt 109.2 kg (240 lb 11.9 oz)   SpO2 100%   BMI 34.54 kg/m   GENERAL: Appears in no obvious distress. obese  EYES: PERRLA. Sclera anicteric. Conjunctivae pink.  ENT: Oral mucosa dry. Mouth breathing.   NECK: Trachea midline. tracheotomy with fluid in the tubing  HEART: RRR. Normal S1, S2. No murmur appreciated.  CHEST: Breath sounds are clear bilaterally.  ABDOMEN: Soft. Non-tender. Non-distended. BS+.  GU: Deferred.  RECTAL: Deferred.  MUSCULOSKELETAL: no joint tenderness, deformities or swelling  EXTREMITIES No edema. No clubbing. No cyanosis.  SKIN: No rash or lesion.  NEURO:somnolent. Oriented x 0    Labs/Diagnostics:     Recent Labs  Lab 12/17/16  0149 12/16/16  1918 12/16/16  1324 12/16/16  0811 12/16/16  0223  12/15/16  1231 12/15/16  0428 12/14/16  1207   WBC 10.50  --   --   --  9.18  --  16.51* 16.23* 19.29*   Hgb 9.1* 9.0*  --  8.7* 8.5* More results in Results Review 9.9* 9.5* 11.2*   Hematocrit 27.9* 27.3*  --  26.9* 26.3* More results in Results Review 30.8* 30.2* 37.9*   Platelets 98*  --   --   --  80*  --  54* 39* 299   Sodium 134*  --   --   --  137  --   --  138 138   Potassium 3.9  --  3.8  --  3.5  --   --  3.7 5.1   Chloride 105  --   --   --  102  --   --  97* 87*   CO2 23  --   --   --  28  --   --  32* 42*   BUN 13.0  --   --   --  20.0  --   --  24.0 30.0*   Creatinine 0.6*  --   --   --  0.6*  --   --  0.6* 0.8   EGFR >60.0  --   --   --   >60.0  --   --  >60.0 >60.0   Glucose 110*  --   --   --  88  --   --  91 119*   AST (SGOT)  --   --   --   --   --   --   --   --  16   ALT  --   --   --   --   --   --   --   --  17   Alkaline Phosphatase  --   --   --   --   --   --   --   --  65   Albumin  --   --   --   --   --   --   --   --  2.1*   Bilirubin, Total  --   --   --   --   --   --   --   --  0.5   More results in Results Review = values in this interval not displayed.  Wt Readings from Last 10 Encounters:   12/17/16 109.2 kg (240 lb 11.9 oz)       Xr Abdomen Ap    Result Date: 12/16/2016  Nonspecific bowel gas pattern Lorinda Creed, MD 12/16/2016 7:13 PM    Xr Chest Ap Portable    Result Date: 12/17/2016   No significant interval change in bilateral pleural effusions and bibasilar opacities in keeping with compressive atelectasis, with concurrent infiltrates not excluded. Gustavus Messing, MD 12/17/2016 8:19 AM    Xr Chest Ap Portable    Result Date: 12/16/2016   No significant change with right larger than left pleural effusions and bibasilar atelectasis versus airspace disease.Filbert Schilder, MD 12/16/2016 8:30 AM    Xr Chest Ap Portable    Result Date: 12/15/2016   Moderate right and small left pleural effusions. Right greater than left basilar opacification, improved. Mitali  Bapna, MD 12/15/2016 7:27 AM    Xr Chest  Ap Portable    Result Date: 12/14/2016  1. Moderate volume right and small volume left pleural effusions. 2. Density throughout the lung bases which may represent atelectatic changes or infiltrates. A follow-up PA and lateral chest is recommended when the patient is stable Fonnie Mu, MD 12/14/2016 1:21 PM      Signed by: Walker Shadow, MD.  [facility]  [phone]

## 2016-12-17 NOTE — Progress Notes (Signed)
400 mg propofol wasted with Doreatha Martin, RN

## 2016-12-17 NOTE — Progress Notes (Signed)
Dr. Lorenda Peck updated on pt status: Pt has not voided since foley removed at 1200.  Bladder scanned and approx 130 cc revealed. A line not correlating with cuff-cuff is significantly higher-ok to remove a-line pending coags.

## 2016-12-17 NOTE — Consults (Signed)
PICC Line Insertion Procedure Note    Procedure:   [x]  #5.5 FR Double Lumen Arrow PICC.  []  #4.5 FR Single Lumen Arrow PICC  []  #6 FR Triple Lumen Arrow PICC    LOT #: [id number]    Indications:  pressor    Procedure Details   Informed consent was obtained for the procedure. Patient/Family provided with education material.   Maximum sterile technique was used:    Nurse: Special educational needs teacher, Cap, Mask, Sterile Gown & Gloves         Patient: Large Sterile Head to Toe Drape         If Assistant or Observer present: Cap/Mask & clean gloves.          All syringes on the sterile field labeled.         Lidocaine 1% Local anesthetic as needed absent reported allergy.          MST w/Ultrasound and Real time directional guidance.     Insertion of #5 FR/18G Double Lumen PICC, inserted to the Rt brachial vein per hospital protocol. Tip location confirmed by ECG or Radiology, with positive blood return all lumens.    MST GUIDEWIRE & PICC STYLET, REMOVED INTACT BY VISUAL INSPECTION.     Findings:  Catheter trimmed Total Length: 37cm  Internal Length: 37cm  External Length: 0cm  Mid upper arm circumference is 35 cm.   Catheter was flushed with 10 cc NS.   Patient tolerated procedure well.  Number of attempts: 1  Est. Blood Loss: Minimal    Confirmation:  []     ECG                   [x]      Chest X-Ray  INDICATION: Intravascular catheter insertion    COMPARISON: 12/17/2016    FINDINGS:  A single radiograph of the chest performed. Portable film.  Right subclavian line has been placed and the tip is in the SVC.  The heart is enlarged and unchanged in size. Tracheostomy tube is in  good position.  The mediastinal and hilar structures are within normal limits.  The lung fields demonstrate stable pleural effusions and increased  alveolar densities most consistent with fluid overload.Marland Kitchen No pleural  effusion.  No pneumothorax.    The  visualized osseous structures demonstrates no acute abnormality.    IMPRESSION:    Good position of  intravascular catheter. No pneumothorax.  Stable infiltrates and effusions most compatible with pulmonary vascular  congestion.    Kinnie Feil, MD   12/17/2016 1:58 PM    Recommendations:  Ready for immediate use. Please follow all post-procedure nursing orders.       Signed by: Delorise Jackson, RN, [facility]

## 2016-12-17 NOTE — Progress Notes (Signed)
Nutritional Support Services  Nutrition Follow-up    [name] 83 y.o. male   MRN: [id number]    Summary of Nutrition Recommendations:  1.Initiate Vital High Protein 1.0 at 20 ml/hour and advance by 10 ml Q 4 hours to goal infusion rate of 50 ml/hour               Provides 1200 kcals, 105 gm protein, 1003 ml free water in 1200 ml total volume     (total of 1395 kcals per day with Propofol running at current rate of 7.4 mL/hr)                Provides 100% of estimated kcal needs and 100% of estimated protein needs      2. Provide 1 packet of ProSource three times daily               EN + Prosource provides 1380 kcals and 150 gm protein     3. Flush tube with minimum of 30-50 ml Q 4 hours for tube patency- additional fluids for hydration per MD    -----------------------------------------------------------------------------------------------------------------                                                     ASSESSMENT DATA     Subjective Nutrition: follow up to RD assessment from 12/14/16 for inadequate energy intake. Enteral nutrition on hold at this time for GI bleeding. Spoke with RN- pt has BM last night that showed sings of old blood. Pt NPO x 3 days at this time.     Events of Current Admission:  admitted with healthcare associated PNA, UTI, AMS, sepsis, acute on chronic respiratory failure, a-fib, HTN; palliative care consult has been placed. Rectal bleeding - GI following.     Medical Hx:  has a past medical history of C2 cervical fracture; Chronic pain; Conjunctivitis; Constipation; Cutaneous abscess of buttock; Dysphagia; Hypertension; Insomnia; Kidney disorder; and Respiratory arrest.     Social Hx: never smoker; nondrinker; from [place]     Diet Hx: Tube feeding prior to admission - Isosource 1.5 @ 55 cc/hour x 20 hours (provides 1650 kcals, 75 gm protein, 840 ml free water in 1100 ml total volume; protein powder 2 scoops/BID; arginiaid 1 packet BID. 175 cc Q 4 hours for water  flushes    Current Diet Order      Diet NPO effective now  Intake: n/a     Anthropometrics:  Anthropometrics  Height: 177.8 cm (5\' 10" )  Weight: 107.7 kg (237 lb 7 oz)  Weight Change: 5.28  IBW/kg (Calculated) Male: 75.48 kg  IBW/kg (Calculated) Male: 68.16 kg  BMI (calculated): 32.4    Weight Monitoring Weight Weight Method BMI (calculated)   12/14/2016 106.595 kg  33.8 kg/m2   12/14/2016 102.3 kg Bed Scale 32.4 kg/m2   12/16/2016 107.7 kg Bed Scale    No weight hx in Epic - will continue to monitor weight trends during admission     Physical Appearance:   Head: obese appearing   Upper Body: obese appearing   Lower Body: obese appearing   Edema: none noted at this time   Skin: stage 2 on sacrum   GI function: rounded abdomen, active bowel sounds, last BM 12/15/16     Estimated Needs:  Estimated Energy Needs  Total Energy Estimated Needs: 1175-1500  kcals  Method for Estimating Needs: 11-14 kcal/kg using 107 kg (obese; ICU)    Estimated Protein Needs  Total Protein Estimated Needs: 150-188 gm  Method for Estimating Needs: 2.0-2.5 gm/kg using IBW 75 kg (obese; ICU)    Fluid Needs  Total Fluid Estimated Needs: 1 ml/kcal or per MD     Pertinent Medications: Reglan, Remeron, MVI  IVF:    . dextrose 50 mL/hr at 12/16/16 2000   . FentaNYL-NaCl (PF) 50 mcg/hr (12/17/16 0756)   . norepinephrine (LEVOPHED) infusion 1 mcg/min (12/17/16 0250)   . propofol 15 mcg/kg/min (12/17/16 0815)   Propofol running at 7.4 mL/hr- provides additional 195 kcals per day     Pertinent labs: BG 110 mg/dL, Low creatinine (0.6), low sodium (134), Hct low (27.9), Hgb low (9.1)     Learning Needs: not appropriate for diet education at this time     Nutrition Assessment Summary: communicated nutrition recommendations with RN                                                     NUTRITION DIAGNOSIS     Inadequate energy intake related to respiratory failure as evidenced by interruption in PEG tube feedings upon transfer from [name] to [facility]. (ongoing)                                                             INTERVENTION     Nutrition Recommendation -   1.Initiate Vital High Protein 1.0 at 20 ml/hour and advance by 10 ml Q 4 hours to goal infusion rate of 50 ml/hour               Provides 1200 kcals, 105 gm protein, 1003 ml free water in 1200 ml total volume     (total of 1395 kcals per day with Propofol running at current rate of 7.4 mL/hr)                Provides 100% of estimated kcal needs and 100% of estimated protein needs      2. Provide 1 packet of ProSource three times daily               EN + Prosource provides 1380 kcals and 150 gm protein     3. Flush tube with minimum of 30-50 ml Q 4 hours for tube patency- additional fluids for hydration per MD    Goals -   1. EN initiation within 24 hours                                                          MONITORING     EN initiation and tolerance, weight, GI fxn, labs  EVALUATION   Goal 1- not met     Nutrition Risk Level: High (will follow up within 5 days and PRN)         Darci Current, RDN, LDN, CPT   Nutrition Office x [phone]

## 2016-12-17 NOTE — Progress Notes (Signed)
New IV tubing.

## 2016-12-17 NOTE — Progress Notes (Signed)
ICU Daily Progress Note                                                  Gean Quint MD, [place], CMD                                                             [phone]    Date Time: 12/17/16 4:37 PM  Patient Name: [name],[name] 83 y.o. male admitted with <principal problem not specified>  Attending Physician: Gean Quint, MD  Room: [phone]   Admit Date: 12/14/2016  LOS: 3 days    Patient status: Inpatient  Hospital Day: 3     Assessment:   Acute respiratory failure, hypercarbic hypoxic on vent with tracheostomy  Nosocomial pneumonia  Septic shock, on low dose pressor  UTI, pseudomonas pansensitive  Fever related to sepsis, continues low grade  Bilateral pleural effusion  Hypoglycemia.  Patient has been on D10W  Acute GIB with melanotic stool with clots on 12/15/16, no further bleeding  Atrial fibrillation on xarelto outpatient, now holding  HTN by hx  Hx c spine injury with trach/peg  Anemia, stable  Coagulopathy, elevated INR from xarelto, corrected post FFP 1/618  Leukocytosis, persistent  Acute thrombocytopenia, likely relatd to sepsis, improving, s/p platelet infusion 12/15/16  Elevated TSH  Mild hematuria  ICU PROBLEM LIST =septic shock resp failure, pna, UTI, GIB,   Plan:   Continue mechanical ventilation  Antibiotics per ID, continues  Follow cultures  Visit to  Start feeding and discontinue D10W  GI following  continue protonix IV, changed to bid dosing from infusion  H/h every 6 hours  Transfuse prn  Am labs, cxr  Full Thyroid function test  Replace electrolytes per protocol  Speech for speaking valve, eventual, pt usually oriented and uses valve at [place]  Monitor hematuria, pt has UTI  dvt proph: no meds sec to gib and low platelets  Gi proph: protonix IV    D/w RN, resp, nutritionist, and pharmacy  Code Status: full code      Subjective:     Patient has no fever, on mechanical ventilation.  Blood pressure borderline low, around 100 still requiring pressors        Medications:       Scheduled Meds: PRN Meds:        amiodarone 200 mg Oral Daily   atorvastatin 20 mg Oral QHS   budesonide 0.5 mg Nebulization BID   ciprofloxacin 1 drop Right Eye Q2H WA   glycopyrrolate 1 mg Oral Daily   meropenem 500 mg Intravenous Q6H   metoclopramide 5 mg Oral TID AC   mirtazapine 15 mg Oral QHS   multivitamin 1 tablet Oral Daily   pantoprazole 40 mg Oral Q12H SCH   senna 17.6 mg Oral QHS   sodium chloride (PF) 3 mL Intravenous Q8H       Continuous Infusions:  . FentaNYL-NaCl (PF) 100 mcg/hr (12/17/16 1500)   . norepinephrine (LEVOPHED) infusion Stopped (12/17/16 1146)      sodium chloride  PRN   acetaminophen 650 mg Q6H PRN   albuterol-ipratropium 3 mL Q4H PRN   calcium GLUConate 1 g  PRN   dextrose 15 g of glucose PRN   And     dextrose 12.5 g PRN   And     glucagon (rDNA) 1 mg PRN   fentaNYL (PF) 50 mcg Q1H PRN   glycopyrrolate 0.2 mg Q8H PRN   LORazepam 0.5 mg Q8H PRN   magnesium sulfate 1 g PRN   ondansetron 4 mg Q8H PRN   potassium chloride 20 mEq PRN   sodium phosphate IVPB 15 mmol PRN   sodium phosphate IVPB 25 mmol PRN   sodium phosphate IVPB 35 mmol PRN         Review of Systems:       Sedated on vent, cannot give ros    Physical Exam:       General Appearance:  Sedated on vent, BUT per nurse follows commands when sedation is lifted  Eyes; perrla  ENT;  No nasal discharge.  Tracheostomy tube in place  Neuro: Sedated  Neck: trach  Chest: Coarse breath sound  Heart: S1, S2, RRR  Abdomen:  Soft.  Bowel sounds normal  Extremities:  No edema  Skin:   Pale coloration  GU:   Foley clear yellow  Left groin tlc noted  Capillary refill time - < 3 sec         Data:     Invasive ICU Hemodynamics:         Art Line  Arterial Line BP: 92/44  Arterial Line MAP (mmHg): 60 mmHg  Pulse rate: 77 mmHg  Arterial Line Location: Left femoral  Art Line Wave Form: Appropriate wave forms           IBW/kg (Calculated) : 73    ABGs:     Recent Labs  Lab 12/14/16  1958   ABG CollectionSite Art Line   Allen's Test No ART. LINE   pH,  Arterial 7.495*   pCO2, Arterial 46.0*   pO2, Arterial 141.0*   HCO3, Arterial 35.2*   Base Excess, Arterial 10.8*   O2 Sat, Arterial 99.1   FIO2 60   Rate 20   Mode: PRVC   PEEP 8   Tidal vol. 500       Vent Settings:  IBW: Vent Settings  Vent Mode: PRVC  FiO2: 30 %  Resp Rate (Set): 20  Vt (Set, mL): 500 mL  PIP Observed (cm H2O): 23 cm H2O  PEEP/EPAP: 5 cm H20  Mean Airway Pressure: 10 cmH20    Patient Lines/Drains/Airways Status    Active PICC Line / CVC Line / PIV Line / Drain / Airway / Intraosseous Line / Epidural Line / ART Line / Line / Wound / Pressure Ulcer / NG/OG Tube     Name:   Placement date:   Placement time:   Site:   Days:    CVC Triple Lumen 12/14/16 Left Femoral  12/14/16    2100    Femoral    less than 1    Peripheral IV 12/14/16 Right Antecubital  12/14/16    1205    Antecubital    1    Gastrostomy/Enterostomy Gastrostomy RUQ          RUQ        Urethral Catheter Temperature probe 16 Fr.  12/14/16    2300    Temperature probe    less than 1    Surgical Airway Shiley 6 mm Cuffed;Long  12/14/16    1855    6 mm    less than 1  Arterial Line 12/14/16 Left Femoral  12/14/16    2100    Femoral    less than 1    Wound 12/14/16 Pressure Injury Sacrum Red open area with drainage  12/14/16    1600    Sacrum    less than 1              VITAL SIGNS   Temp:  [98.4 F (36.9 C)-100.1 F (37.8 C)] 98.4 F (36.9 C)  Heart Rate:  [82-130] 101  Resp Rate:  [20-33] 20  BP: (93-143)/(54-80) 115/55  Arterial Line BP: (92-144)/(44-72) 92/44  FiO2:  [30 %] 30 %  Pulse ox: 99    Intake/Output Summary (Last 24 hours) at 12/17/16 1637  Last data filed at 12/17/16 1500   Gross per 24 hour   Intake          2182.53 ml   Output             1275 ml   Net           907.53 ml        Labs:     CBC w/Diff CMP     Recent Labs  Lab 12/17/16  0149 12/16/16  1918 12/16/16  0811 12/16/16  0223  12/15/16  1231 12/15/16  0428   WBC 10.50  --   --  9.18  --  16.51* 16.23*   Hgb 9.1* 9.0* 8.7* 8.5* More results in Results Review  9.9* 9.5*   Hematocrit 27.9* 27.3* 26.9* 26.3* More results in Results Review 30.8* 30.2*   Platelets 98*  --   --  80*  --  54* 39*   MCV 84.0  --   --  84.3  --  84.8 86.3   Neutrophils 80.3  --   --  71.2  --   --   --    Segmented Neutrophils  --   --   --   --   --   --  81   More results in Results Review = values in this interval not displayed.    PT/INR     Recent Labs  Lab 12/17/16  0149 12/16/16  0223 12/15/16  0428   PT INR 1.2* 1.2* 1.7*         Recent Labs  Lab 12/17/16  1212 12/17/16  0149 12/16/16  1324 12/16/16  0223 12/15/16  0428 12/14/16  1207   Sodium  --  134*  --  137 138 138   Potassium 3.9 3.9 3.8 3.5 3.7 5.1   Chloride  --  105  --  102 97* 87*   CO2  --  23  --  28 32* 42*   BUN  --  13.0  --  20.0 24.0 30.0*   Creatinine  --  0.6*  --  0.6* 0.6* 0.8   Glucose  --  110*  --  88 91 119*   Calcium  --  7.4*  --  7.6* 7.8* 8.6   Magnesium 2.1 1.7 1.8 1.6  --  2.0   Phosphorus 2.5 2.5  --  2.6  --  3.7   Protein, Total  --   --   --   --   --  5.8*   Albumin  --   --   --   --   --  2.1*   AST (SGOT)  --   --   --   --   --  16  ALT  --   --   --   --   --  17   Alkaline Phosphatase  --   --   --   --   --  65   Bilirubin, Total  --   --   --   --   --  0.5      Glucose POCT     Recent Labs  Lab 12/17/16  0149 12/16/16  0223 12/15/16  0428 12/14/16  1207 12/12/16  0530   Glucose 110* 88 91 119* 100          Recent Labs  Lab 12/14/16  1552 12/14/16  1207   Troponin I <0.01 <0.01       Urinalysis    Recent Labs  Lab 12/15/16  0429  12/14/16  0445   Urine Type Catheterized, F More results in Results Review Random   Color, UA Yellow More results in Results Review YELLOW   Clarity, UA Hazy More results in Results Review CLOUDY*   Specific Gravity UA 1.020 More results in Results Review 1.018   Urine pH 6.0 More results in Results Review 7.0   Nitrite, UA Negative More results in Results Review NEGATIVE   Ketones UA 80 More results in Results Review NEGATIVE   Urobilinogen, UA Negative More results  in Results Review 0.2   Bilirubin, UA Negative More results in Results Review NEGATIVE   Blood, UA Negative More results in Results Review NEGATIVE   RBC, UA 3 - 5 More results in Results Review 0-2   WBC, UA TNTC* More results in Results Review TNTC*   Urine Bacteria  --   --  NONE   More results in Results Review = values in this interval not displayed.    Rads:   Xr Abdomen Ap    Result Date: 12/16/2016  Nonspecific bowel gas pattern Lorinda Creed, MD 12/16/2016 7:13 PM    Xr Chest Ap Portable    Result Date: 12/17/2016   Good position of intravascular catheter. No pneumothorax. Stable infiltrates and effusions most compatible with pulmonary vascular congestion. Kinnie Feil, MD 12/17/2016 1:58 PM    Xr Chest Ap Portable    Result Date: 12/17/2016   No significant interval change in bilateral pleural effusions and bibasilar opacities in keeping with compressive atelectasis, with concurrent infiltrates not excluded. Gustavus Messing, MD 12/17/2016 8:19 AM          I have personally reviewed the patient's history and 24 hour interval events, along with vitals, labs, radiology images and  ventilator settings detail.     So far today I have spent 35 minutes providing care for this patient excluding teaching and billable procedures, and not overlapping with any other providers.:         Gean Quint, MD     12/17/16 4:37 PM

## 2016-12-17 NOTE — Progress Note - Problem Oriented Charting Notewrit (Signed)
Procedure was difficult due to difficulty to thread the catheter. Required rad wires to help thread the catheter.

## 2016-12-17 NOTE — Progress Notes (Signed)
INFECTIOUS DISEASES PROGRESS NOTE    Date Time: 12/17/16 9:41 AM  Patient Name: [name],[name]  Patient status.Inpatient  Hospital Day: 3    Assessment and Plan:     1.  Pneumonia; pseudimonas and providencia  2.  CO2 retention.  3.  Chronic respiratory failure.  4.  UTI/pseudomonas    PLAN: 1. Cont Cipro, active Against all GRAM-negative rods  2.  Repeat pro calcitonin  3.  Change central lines  Subjective:   Trach    Review of Systems:   Review of Systems - General ROS: negative    Antibiotics:   As above    Other medications reviewed in EPIC.  Central Access:   Femoral lines    Physical Exam:   BP 110/57   Pulse 82   Temp 99 F (37.2 C) (Foley Temp)   Resp 20   Ht 1.778 m (5\' 10" )   Wt 109.2 kg (240 lb 11.9 oz)   SpO2 100%   BMI 34.54 kg/m @  Vitals:    12/17/16 0921   BP:    Pulse:    Resp:    Temp:    SpO2: 100%       Chest - rhonchi noted , Transmitted from above  Heart - irregularly irregular rhythm with rate 72  Abdomen - soft, nontender, nondistended, no masses or organomegaly    Labs:     Microbiology Results     Procedure Component Value Units Date/Time    Blood Culture Aerobic/Anaerobic #1 [[id number]] Collected:  12/14/16 1207    Specimen:  Arm from Blood Updated:  12/16/16 1421    Narrative:       ORDER#: [id number]                                    ORDERED BY: NITZBERG, MICHA  SOURCE: Blood arm                                    COLLECTED:  12/14/16 12:07  ANTIBIOTICS AT COLL.:                                RECEIVED :  12/14/16 14:18  Culture Blood Aerobic and Anaerobic        PRELIM      12/16/16 14:21  12/15/16   No Growth after 1 day/s of incubation.  12/16/16   No Growth after 2 day/s of incubation.      Blood Culture Aerobic/Anaerobic #2 [[id number]] Collected:  12/14/16 1318    Specimen:  Arm from Blood Updated:  12/16/16 1944    Narrative:       [name] [id number] called Micro Results of Pos Cx. Results read back by:U [id number], by  [id number] on 12/16/2016 at 19:43  ORDER#: [id number]                                     ORDERED BY: NITZBERG, MICHA  SOURCE: Blood arm                                    COLLECTED:  12/14/16 13:18  ANTIBIOTICS AT COLL.:  RECEIVED :  12/14/16 17:04  J [id number] called Micro Results of Pos Cx. Results read back by:U [id number], by [id number] on 12/16/2016 at 19:43  Culture Blood Aerobic and Anaerobic        PRELIM      12/16/16 19:44   +  12/15/16   No Growth after 1 day/s of incubation.  12/16/16   No Growth after 2 day/s of incubation.             Anaerobic Blood Culture Positive in 48 to 72 hours             Gram Stain Shows: Gram positive cocci in clusters             Identification to follow      MRSA Culture [[id number]] Collected:  12/14/16 1208    Specimen:  Body Fluid from Nasal/Throat ASC Admission Updated:  12/15/16 1110    Narrative:       ORDER#: [id number]                                    ORDERED BY: [name], MICHA  SOURCE: Nares and Throat                             COLLECTED:  12/14/16 12:08  ANTIBIOTICS AT COLL.:                                RECEIVED :  12/14/16 15:39  Culture MRSA Surveillance                  FINAL       12/15/16 11:10  12/15/16   Negative for Methicillin Resistant Staph aureus from Nares and             Negative for Methicillin Resistant Staph aureus from Throat      MRSA culture (If not done in Triage) [[id number]] Collected:  12/15/16 0428    Specimen:  Body Fluid from Nasal/Throat ASC Admission Updated:  12/16/16 0003    Narrative:       ORDER#: [id number]                                    ORDERED BY: NEIGH, EMILY  SOURCE: Nares and Throat                             COLLECTED:  12/15/16 04:28  ANTIBIOTICS AT COLL.:                                RECEIVED :  12/15/16 06:53  Culture MRSA Surveillance                  FINAL       12/16/16 00:03  12/16/16   Negative for Methicillin Resistant Staph aureus from Nares and             Negative for Methicillin Resistant Staph aureus from Throat      Rapid influenza A/B antigens  [[id number]] Collected:  12/14/16 1207    Specimen:  Nasopharyngeal from Nasal Aspirate Updated:  12/14/16 1230    Narrative:  ORDER#: [id number]                                    ORDERED BY: NITZBERG, MICHA  SOURCE: Nasal Aspirate                               COLLECTED:  12/14/16 12:07  ANTIBIOTICS AT COLL.:                                RECEIVED :  12/14/16 12:14  Influenza Rapid Antigen A&B                FINAL       12/14/16 12:29  12/14/16   Negative for Influenza A and B             Reference Range: Negative      Sputum Culture [[id number]] Collected:  12/14/16 1206    Specimen:  Sputum from Tracheal Aspirate Updated:  12/16/16 1622    Narrative:       ORDER#: [id number]                                    ORDERED BY: [name], MICHA  SOURCE: Tracheal Aspirate trach                      COLLECTED:  12/14/16 12:06  ANTIBIOTICS AT COLL.:                                RECEIVED :  12/14/16 16:16  Stain, Gram (Respiratory)                  FINAL       12/14/16 22:22   +  12/14/16   Few Squamous epithelial cells             Few WBC's             Moderate Mixed Respiratory Flora  Culture and Gram Stain, Aerobic, RespiratorPRELIM      12/16/16 16:22   +  12/15/16   Light growth of mixed upper respiratory flora  12/16/16   Heavy growth of Providencia stuartii               Further workup to follow including susceptibility testing    12/16/16   Heavy growth of Pseudomonas aeruginosa               Susceptibility to follow        Urine culture [[id number]] Collected:  12/14/16 1315    Specimen:  Urine from Urine, Catheterized, In & Out Updated:  12/16/16 1025    Narrative:       ORDER#: [id number]                                    ORDERED BY: [name]  SOURCE: Urine, Catheterized, In & Out                COLLECTED:  12/14/16 13:15  ANTIBIOTICS AT COLL.:  RECEIVED :  12/14/16 17:39  Culture Urine                              FINAL       12/16/16 10:25   +  12/16/16   50,000 - 70,000  CFU/ML Pseudomonas aeruginosa      _____________________________________________________________________________                                  P.aeruginosa    ANTIBIOTICS                     MIC  INTRP      _____________________________________________________________________________  Amikacin                        <=8    S        Aztreonam                        8     S        Cefepime                         4     S        Ceftazidime                      4     S        Ciprofloxacin                  <=0.5   S        Gentamicin                      <=2    S        Levofloxacin                    <=1    S        Meropenem                     <=0.25   S        Piperacillin/Tazobactam         8/4    S        Tobramycin                      <=2    S        _____________________________________________________________________________            S=SUSCEPTIBLE     I=INTERMEDIATE     R=RESISTANT                            N/S=NON-SUSCEPTIBLE  _____________________________________________________________________________            CBC w/Diff CMP     Recent Labs  Lab 12/17/16  0149 12/16/16  1918 12/16/16  0811 12/16/16  0223  12/15/16  1231 12/15/16  0428   WBC 10.50  --   --  9.18  --  16.51* 16.23*   Hgb 9.1* 9.0* 8.7* 8.5* More results in Results Review 9.9* 9.5*   Hematocrit 27.9* 27.3* 26.9* 26.3* More results in Results Review 30.8* 30.2*  Platelets 98*  --   --  80*  --  54* 39*   MCV 84.0  --   --  84.3  --  84.8 86.3   Neutrophils 80.3  --   --  71.2  --   --   --    Segmented Neutrophils  --   --   --   --   --   --  81   More results in Results Review = values in this interval not displayed.    PT/INR     Recent Labs  Lab 12/17/16  0149 12/16/16  0223 12/15/16  0428   PT INR 1.2* 1.2* 1.7*         Recent Labs  Lab 12/17/16  0149 12/16/16  1324 12/16/16  0223 12/15/16  0428 12/14/16  1207   Sodium 134*  --  137 138 138   Potassium 3.9 3.8 3.5 3.7 5.1   Chloride 105  --  102 97* 87*   CO2 23  --  28 32* 42*    BUN 13.0  --  20.0 24.0 30.0*   Creatinine 0.6*  --  0.6* 0.6* 0.8   Glucose 110*  --  88 91 119*   Calcium 7.4*  --  7.6* 7.8* 8.6   Magnesium 1.7 1.8 1.6  --  2.0   Phosphorus 2.5  --  2.6  --  3.7   Protein, Total  --   --   --   --  5.8*   Albumin  --   --   --   --  2.1*   AST (SGOT)  --   --   --   --  16   ALT  --   --   --   --  17   Alkaline Phosphatase  --   --   --   --  65   Bilirubin, Total  --   --   --   --  0.5      Glucose POCT     Recent Labs  Lab 12/17/16  0149 12/16/16  0223 12/15/16  0428 12/14/16  1207 12/12/16  0530   Glucose 110* 88 91 119* 100          Rads:     Radiology Results (24 Hour)     Procedure Component Value Units Date/Time    XR Chest AP Portable [[id number]] Collected:  12/17/16 0818    Order Status:  Completed Updated:  12/17/16 0823    Narrative:       HISTORY: pna    TECHNIQUE: Single AP view of the chest.    COMPARISON: 12/16/2016.    FINDINGS:  There is no significant interval change in layering bilateral  pleural effusions and hazy opacities of the lung bases in keeping with  compressive atelectasis, although concurrent infiltrates cannot be  excluded. Stable cardiac and mediastinal contours. No pneumothorax.      Impression:        No significant interval change in bilateral pleural  effusions and bibasilar opacities in keeping with compressive  atelectasis, with concurrent infiltrates not excluded.    Gustavus Messing, MD   12/17/2016 8:19 AM    XR Abdomen AP [[id number]] Collected:  12/16/16 1913    Order Status:  Completed Updated:  12/16/16 1918    Narrative:       Abdominal x-ray 1 views    History: Abdominal distention    Comparison: None  Findings: Suboptimal penetration secondary to patient body habitus.  Nonspecific bowel gas pattern. No dilated small bowel loops. Kidneys are  obscured. There is degenerative disc space narrowing lower lumbar spine.  Bilateral hip joint space narrowing.      Impression:         Nonspecific bowel gas pattern    Lorinda Creed, MD    12/16/2016 7:13 PM            Signed by: Zachery Dauer

## 2016-12-17 NOTE — Progress Notes (Signed)
Chaplain Service      Assessment:  Spiritual Assessment: Unable to assess (comment)    Background:  Visit Type: Initial (PC) was made by Chaplain with patient, [name], based on Source: Chaplain Initiated, Palliative Care Consult.  Present at Visit: patient.  Spiritual Care Provided to: patient only.  Length of Visit: 0-15 minutes .    Summary:  Reason for Request: Spiritual Support, Spiritual Assessment   Spiritual Care Interventions: Provided silent and supportive presence   Spiritual Care Outcomes: Patient appeared to have appreciated visit      Notes:    Patient asleep during visit. Pastoral presence and contact information provided.

## 2016-12-18 ENCOUNTER — Inpatient Hospital Stay: Payer: Medicare Other

## 2016-12-18 ENCOUNTER — Other Ambulatory Visit: Payer: Medicare Other

## 2016-12-18 LAB — CBC AND DIFFERENTIAL
Absolute NRBC: 0 10*3/uL
Basophils Absolute Automated: 0.02 10*3/uL (ref 0.00–0.20)
Basophils Automated: 0.2 %
Eosinophils Absolute Automated: 0.31 10*3/uL (ref 0.00–0.70)
Eosinophils Automated: 3.1 %
Hematocrit: 26.4 % — ABNORMAL LOW (ref 42.0–52.0)
Hgb: 8.4 g/dL — ABNORMAL LOW (ref 13.0–17.0)
Immature Granulocytes Absolute: 0.07 10*3/uL — ABNORMAL HIGH
Immature Granulocytes: 0.7 %
Lymphocytes Absolute Automated: 0.95 10*3/uL (ref 0.50–4.40)
Lymphocytes Automated: 9.5 %
MCH: 27.1 pg — ABNORMAL LOW (ref 28.0–32.0)
MCHC: 31.8 g/dL — ABNORMAL LOW (ref 32.0–36.0)
MCV: 85.2 fL (ref 80.0–100.0)
MPV: 12.1 fL (ref 9.4–12.3)
Monocytes Absolute Automated: 0.89 10*3/uL (ref 0.00–1.20)
Monocytes: 8.9 %
Neutrophils Absolute: 7.73 10*3/uL (ref 1.80–8.10)
Neutrophils: 77.6 %
Nucleated RBC: 0 /100 WBC (ref 0.0–1.0)
Platelets: 116 10*3/uL — ABNORMAL LOW (ref 140–400)
RBC: 3.1 10*6/uL — ABNORMAL LOW (ref 4.70–6.00)
RDW: 16 % — ABNORMAL HIGH (ref 12–15)
WBC: 9.97 10*3/uL (ref 3.50–10.80)

## 2016-12-18 LAB — MAGNESIUM
Magnesium: 1.7 mg/dL (ref 1.6–2.6)
Magnesium: 1.9 mg/dL (ref 1.6–2.6)

## 2016-12-18 LAB — HEMOLYSIS INDEX
Hemolysis Index: 3 (ref 0–18)
Hemolysis Index: 11 (ref 0–18)
Hemolysis Index: 8 (ref 0–18)

## 2016-12-18 LAB — BASIC METABOLIC PANEL
Anion Gap: 5 (ref 5.0–15.0)
BUN: 15 mg/dL (ref 9.0–28.0)
CO2: 25 mEq/L (ref 22–29)
Calcium: 7.7 mg/dL — ABNORMAL LOW (ref 7.9–10.2)
Chloride: 104 mEq/L (ref 100–111)
Creatinine: 0.7 mg/dL (ref 0.7–1.3)
Glucose: 88 mg/dL (ref 70–100)
Potassium: 3.9 mEq/L (ref 3.5–5.1)
Sodium: 134 mEq/L — ABNORMAL LOW (ref 136–145)

## 2016-12-18 LAB — POTASSIUM: Potassium: 3.9 mEq/L (ref 3.5–5.1)

## 2016-12-18 LAB — GFR: EGFR: >60

## 2016-12-18 LAB — GLUCOSE WHOLE BLOOD - POCT
Whole Blood Glucose POCT: 101 mg/dL — ABNORMAL HIGH (ref 70–100)
Whole Blood Glucose POCT: 126 mg/dL — ABNORMAL HIGH (ref 70–100)
Whole Blood Glucose POCT: 97 mg/dL (ref 70–100)
Whole Blood Glucose POCT: 99 mg/dL (ref 70–100)

## 2016-12-18 LAB — PROCALCITONIN: Procalcitonin: <0.1 (ref 0.0–0.1)

## 2016-12-18 LAB — PHOSPHORUS: Phosphorus: 3.5 mg/dL (ref 2.3–4.7)

## 2016-12-18 MED ORDER — METOPROLOL TARTRATE 5 MG/5ML IV SOLN
2.5000 mg | Freq: Once | INTRAVENOUS | Status: AC
Start: 2016-12-18 — End: 2016-12-18
  Administered 2016-12-18: 2.5 mg via INTRAVENOUS
  Filled 2016-12-18: qty 5

## 2016-12-18 MED ORDER — SODIUM CHLORIDE 0.9 % IV SOLN
INTRAVENOUS | Status: DC
Start: 2016-12-18 — End: 2016-12-20

## 2016-12-18 MED ORDER — FENTANYL CITRATE (PF) 50 MCG/ML IJ SOLN (WRAP)
50.0000 ug | INTRAMUSCULAR | Status: DC | PRN
Start: 2016-12-18 — End: 2016-12-27
  Administered 2016-12-18 – 2016-12-25 (×13): 50 ug via INTRAVENOUS
  Filled 2016-12-18 (×13): qty 2

## 2016-12-18 MED ORDER — METOPROLOL TARTRATE 5 MG/5ML IV SOLN
2.5000 mg | Freq: Once | INTRAVENOUS | Status: DC
Start: 2016-12-18 — End: 2016-12-18

## 2016-12-18 MED ORDER — SODIUM CHLORIDE 0.9 % IV BOLUS
500.0000 mL | Freq: Once | INTRAVENOUS | Status: AC
Start: 2016-12-18 — End: 2016-12-18
  Administered 2016-12-18: 500 mL via INTRAVENOUS

## 2016-12-18 MED ORDER — CEFTAZIDIME 2 G IJ SOLR
2.0000 g | Freq: Three times a day (TID) | INTRAMUSCULAR | Status: DC
Start: 2016-12-18 — End: 2016-12-21
  Administered 2016-12-18 – 2016-12-21 (×9): 2 g via INTRAVENOUS
  Filled 2016-12-18 (×15): qty 2

## 2016-12-18 MED ORDER — FENTANYL CITRATE (PF) 50 MCG/ML IJ SOLN (WRAP)
50.0000 ug | INTRAMUSCULAR | Status: DC | PRN
Start: 2016-12-18 — End: 2016-12-18

## 2016-12-18 NOTE — Plan of Care (Signed)
Problem: Inadequate Cardiac Output  Goal: Adequate tissue perfusion will be maintained  Outcome: Progressing   12/16/16 1718   Goal/Interventions addressed this shift   Adequate tissue perfusion will be maintained Monitor/assess vital signs;Monitor/assess lab values and report abnormal values;Monitor intake and output;Monitor/assess for signs of VTE (edema of calf/thigh redness, pain);Monitor for signs and symptoms of a pulmonary embolism (dyspnea, tachypnea, tachycardia, confusion);VTE Prevention: Administer anticoagulant(s) and/or apply anti-embolism stockings/devices as ordered;Position patient for maximum circulation/cardiac output;Increase mobility as tolerated/progressive mobility;Perform active/passive ROM;Assess and monitor skin integrity;Provide wound/skin care;Place shoes or other foot protection on patient   Patient continues to responds to pain.   Fentanyl drip at .   Trach maintained. Lung sounds diminished. One time dose of 2.5mg  Lopressor given due to HR in 120's to 140's. HR down to high 90's to low 100's after dose.     Problem: Artificial Airway  Goal: Tracheostomy will be maintained  Outcome: Progressing   12/17/16 1022   Goal/Interventions addressed this shift   Tracheostomy will be maintained Suction secretions as needed;Keep head of bed at 30 degrees, unless contraindicated;Perform deep oropharyngeal suctioning at least every 4 hours;Apply water-based moisturizer to lips;Utilize tracheostomy securing device;Support ventilator tubing to avoid pressure from drag of tubing;Tracheostomy care every shift and as needed;Maintain surgical airway kit or tracheostomy tray at bedside;Keep additional tracheostomy tube of the same size and one size smaller at bedside       Problem: Non-Violent Restraints Interdisciplinary Plan  Goal: Will be injury free during the use of non-violent restraints  Outcome: Progressing   12/18/16 0755   Goal/Interventions addressed this shift   Will be injury free during  the use of non-violent restraints Attempt all alternatives before use of restraints;Initiate least restrictive type of restraint that is effective;Provide and maintain safe environment;Notify family of initiation of restraints;Include patient/family/caregiver in decisions related to safety;Ensure safety devices are properly applied and maintained;Document observed patient actions according to protocol;Document significant changes in patient condition;Provide debriefing as soon as possible and appropriate;Reassess need for continued restraints;Ensure that order for restraints has not expired;Nurse to accompany patient off unit when on restraints;Remove restraints before the indicated maximum length of time when meets criteria for discontinuation

## 2016-12-18 NOTE — Progress Notes (Signed)
Palliative Care Progress Note - [facility]    Date Time: 12/18/16 2:39 PM  Patient Name: [name],[name]  Attending Physician: Gean Quint, MD    Assessment/Plan:     83 y.o.male with hx of C2 spine fracture, chronic respiratory failure, bed-bound.   --Acute on chronic respiratory failure remains on the vent and pressors.   --HCAP  --UTI  --Sepsis on pressors and vent.    Advance Care Plan:  Advance Directive: none   Code status: Full Code   Designated agent: statutory. Has a wife who reportedly makes decisions in concert with her children. Daughter has been signing consents for treatments.   *Discussed with RNs. Family to schedule a time for family meeting.     Agitation/restlessness:  uncontrolled off  sedation. Likely delirium  Haldol vs ativan.    Pain: controlled on fentanyl drip    Excessive secretions: much improved. Continue current care.     Constipation smear overnight.  Continue on prophylaxis.     Disposition :TBD    Chief Complaint:   F/u agitation    Interval Hx: d/w RN   Patient pulling at devices off sedation. Agitated, confused. Hard to give verbal calming or direction as patient is very HOH. Smear of stool today. Still on pressors and vent.     Review of Systems   Unable to perform ROS: Critical illness           Social History:   Code status: Full Code     Allergies:     Allergies   Allergen Reactions   . Dilaudid [Hydromorphone Hcl]        Medications:   Medication list/MAR reviewed.    Review of Systems:   Negative other than described above.    Physical Exam:   VS:   Vitals:    12/18/16 1300   BP: 104/56   Pulse: (!) 119   Resp: 21   Temp:    SpO2: 96%     GENERAL: Appears in no obvious distress. sedated  HEART: RRR. Normal S1, S2. No murmur appreciated.  CHEST: Breath sounds are clear bilaterally.  ABDOMEN: Soft. Non-tender. Non-distended. BS+.  GU: Deferred.  RECTAL: Deferred.  MUSCULOSKELETAL: no joint tenderness, deformities or swelling  EXTREMITIES:No edema. No  clubbing. No cyanosis.  SKIN: No rash or lesion.      Labs/Diagnostics:   Reviewed.   .LAB  Wt Readings from Last 5 Encounters:   12/17/16 109.2 kg (240 lb 11.9 oz)     Ht Readings from Last 1 Encounters:   12/14/16 1.778 m (5\' 10" )     Xr Chest Ap Portable    Result Date: 12/18/2016   Stable examination with bilateral hazy parenchymal opacification and pleural effusions. Cannot exclude underlying pulmonary edema. Filbert Schilder, MD 12/18/2016 8:38 AM      Signed by: Walker Shadow, MD  [facility]  [phone]

## 2016-12-18 NOTE — Progress Notes (Signed)
INFECTIOUS DISEASES PROGRESS NOTE    Date Time: 12/18/16 3:15 PM  Patient Name: [name]  Patient status.Inpatient  Hospital Day: 4    Assessment and Plan:     1.  Pneumonia; pseudimonas and providencia  2.  CO2 retention.  3.  Chronic respiratory failure.  4.  UTI/pseudomonas    PLAN: 1. Consolidate antibiotics with [name]  2.  Palliative care consult  Subjective:   Trach    Review of Systems:   Review of Systems - General ROS: negative    Antibiotics:   As above    Other medications reviewed in EPIC.  Central Access:   Femoral lines    Physical Exam:   BP 97/55   Pulse 99   Temp 98.6 F (37 C) (Axillary)   Resp 13   Ht 1.778 m (5\' 10" )   Wt 109.2 kg (240 lb 11.9 oz)   SpO2 97%   BMI 34.54 kg/m @  Vitals:    12/18/16 1512   BP:    Pulse:    Resp:    Temp:    SpO2: 97%       Chest - rhonchi noted With decreased breath sounds  Heart - irregularly irregular rhythm with rate 80  Abdomen - soft, nontender, nondistended, no masses or organomegaly    Labs:   Pro calcitonin less than 0.1  Microbiology Results     Procedure Component Value Units Date/Time    Blood Culture Aerobic/Anaerobic #1 [[id number]] Collected:  12/14/16 1207    Specimen:  Arm from Blood Updated:  12/16/16 1421    Narrative:       ORDER#: [id number]                                    ORDERED BY: NITZBERG, MICHA  SOURCE: Blood arm                                    COLLECTED:  12/14/16 12:07  ANTIBIOTICS AT COLL.:                                RECEIVED :  12/14/16 14:18  Culture Blood Aerobic and Anaerobic        PRELIM      12/16/16 14:21  12/15/16   No Growth after 1 day/s of incubation.  12/16/16   No Growth after 2 day/s of incubation.      Blood Culture Aerobic/Anaerobic #2 [[id number]] Collected:  12/14/16 1318    Specimen:  Arm from Blood Updated:  12/16/16 1944    Narrative:       [name] [id number] called Micro Results of Pos Cx. Results read back by:U [id number], by  [id number] on 12/16/2016 at 19:43  ORDER#: [id number]                                     ORDERED BY: NITZBERG, MICHA  SOURCE: Blood arm                                    COLLECTED:  12/14/16 13:18  ANTIBIOTICS AT COLL.:  RECEIVED :  12/14/16 17:04  J [id number] called Micro Results of Pos Cx. Results read back by:U [id number], by [id number] on 12/16/2016 at 19:43  Culture Blood Aerobic and Anaerobic        PRELIM      12/16/16 19:44   +  12/15/16   No Growth after 1 day/s of incubation.  12/16/16   No Growth after 2 day/s of incubation.             Anaerobic Blood Culture Positive in 48 to 72 hours             Gram Stain Shows: Gram positive cocci in clusters             Identification to follow      MRSA Culture [[id number]] Collected:  12/14/16 1208    Specimen:  Body Fluid from Nasal/Throat ASC Admission Updated:  12/15/16 1110    Narrative:       ORDER#: [id number]                                    ORDERED BY: [name], MICHA  SOURCE: Nares and Throat                             COLLECTED:  12/14/16 12:08  ANTIBIOTICS AT COLL.:                                RECEIVED :  12/14/16 15:39  Culture MRSA Surveillance                  FINAL       12/15/16 11:10  12/15/16   Negative for Methicillin Resistant Staph aureus from Nares and             Negative for Methicillin Resistant Staph aureus from Throat      MRSA culture (If not done in Triage) [[id number]] Collected:  12/15/16 0428    Specimen:  Body Fluid from Nasal/Throat ASC Admission Updated:  12/16/16 0003    Narrative:       ORDER#: [id number]                                    ORDERED BY: NEIGH, EMILY  SOURCE: Nares and Throat                             COLLECTED:  12/15/16 04:28  ANTIBIOTICS AT COLL.:                                RECEIVED :  12/15/16 06:53  Culture MRSA Surveillance                  FINAL       12/16/16 00:03  12/16/16   Negative for Methicillin Resistant Staph aureus from Nares and             Negative for Methicillin Resistant Staph aureus from Throat      Rapid influenza A/B antigens [[id number]]  Collected:  12/14/16 1207    Specimen:  Nasopharyngeal from Nasal Aspirate Updated:  12/14/16 1230    Narrative:  ORDER#: [id number]                                    ORDERED BY: NITZBERG, MICHA  SOURCE: Nasal Aspirate                               COLLECTED:  12/14/16 12:07  ANTIBIOTICS AT COLL.:                                RECEIVED :  12/14/16 12:14  Influenza Rapid Antigen A&B                FINAL       12/14/16 12:29  12/14/16   Negative for Influenza A and B             Reference Range: Negative      Sputum Culture [[id number]] Collected:  12/14/16 1206    Specimen:  Sputum from Tracheal Aspirate Updated:  12/16/16 1622    Narrative:       ORDER#: [id number]                                    ORDERED BY: [name], MICHA  SOURCE: Tracheal Aspirate trach                      COLLECTED:  12/14/16 12:06  ANTIBIOTICS AT COLL.:                                RECEIVED :  12/14/16 16:16  Stain, Gram (Respiratory)                  FINAL       12/14/16 22:22   +  12/14/16   Few Squamous epithelial cells             Few WBC's             Moderate Mixed Respiratory Flora  Culture and Gram Stain, Aerobic, RespiratorPRELIM      12/16/16 16:22   +  12/15/16   Light growth of mixed upper respiratory flora  12/16/16   Heavy growth of Providencia stuartii               Further workup to follow including susceptibility testing    12/16/16   Heavy growth of Pseudomonas aeruginosa               Susceptibility to follow        Urine culture [[id number]] Collected:  12/14/16 1315    Specimen:  Urine from Urine, Catheterized, In & Out Updated:  12/16/16 1025    Narrative:       ORDER#: [id number]                                    ORDERED BY: [name]  SOURCE: Urine, Catheterized, In & Out                COLLECTED:  12/14/16 13:15  ANTIBIOTICS AT COLL.:  RECEIVED :  12/14/16 17:39  Culture Urine                              FINAL       12/16/16 10:25   +  12/16/16   50,000 - 70,000 CFU/ML  Pseudomonas aeruginosa      _____________________________________________________________________________                                  P.aeruginosa    ANTIBIOTICS                     MIC  INTRP      _____________________________________________________________________________  Amikacin                        <=8    S        Aztreonam                        8     S        Cefepime                         4     S        Ceftazidime                      4     S        Ciprofloxacin                  <=0.5   S        Gentamicin                      <=2    S        Levofloxacin                    <=1    S        Meropenem                     <=0.25   S        Piperacillin/Tazobactam         8/4    S        Tobramycin                      <=2    S        _____________________________________________________________________________            S=SUSCEPTIBLE     I=INTERMEDIATE     R=RESISTANT                            N/S=NON-SUSCEPTIBLE  _____________________________________________________________________________            CBC w/Diff CMP     Recent Labs  Lab 12/18/16  0313 12/17/16  0149 12/16/16  1918  12/16/16  0223   WBC 9.97 10.50  --   --  9.18   Hgb 8.4* 9.1* 9.0* More results in Results Review 8.5*   Hematocrit 26.4* 27.9* 27.3* More results in Results Review 26.3*   Platelets 116* 98*  --   --  80*  MCV 85.2 84.0  --   --  84.3   Neutrophils 77.6 80.3  --   --  71.2   More results in Results Review = values in this interval not displayed.    PT/INR     Recent Labs  Lab 12/17/16  0149 12/16/16  0223 12/15/16  0428   PT INR 1.2* 1.2* 1.7*         Recent Labs  Lab 12/18/16  1342 12/18/16  0313 12/17/16  1212 12/17/16  0149  12/16/16  0223  12/14/16  1207   Sodium  --  134*  --  134*  --  137 More results in Results Review 138   Potassium  --  3.9 3.9 3.9 More results in Results Review 3.5 More results in Results Review 5.1   Chloride  --  104  --  105  --  102 More results in Results Review 87*   CO2  --  25  --   23  --  28 More results in Results Review 42*   BUN  --  15.0  --  13.0  --  20.0 More results in Results Review 30.0*   Creatinine  --  0.7  --  0.6*  --  0.6* More results in Results Review 0.8   Glucose  --  88  --  110*  --  88 More results in Results Review 119*   Calcium  --  7.7*  --  7.4*  --  7.6* More results in Results Review 8.6   Magnesium 1.9 1.7 2.1 1.7 More results in Results Review 1.6  --  2.0   Phosphorus  --  3.5 2.5 2.5  --  2.6  --  3.7   Protein, Total  --   --   --   --   --   --   --  5.8*   Albumin  --   --   --   --   --   --   --  2.1*   AST (SGOT)  --   --   --   --   --   --   --  16   ALT  --   --   --   --   --   --   --  17   Alkaline Phosphatase  --   --   --   --   --   --   --  65   Bilirubin, Total  --   --   --   --   --   --   --  0.5   More results in Results Review = values in this interval not displayed.   Glucose POCT     Recent Labs  Lab 12/18/16  0313 12/17/16  0149 12/16/16  0223 12/15/16  0428 12/14/16  1207 12/12/16  0530   Glucose 88 110* 88 91 119* 100          Rads:     Radiology Results (24 Hour)     Procedure Component Value Units Date/Time    XR Chest AP Portable [[id number]] Collected:  12/18/16 0835    Order Status:  Completed Updated:  12/18/16 0842    Narrative:       CLINICAL INDICATION: Dyspnea    COMPARISON: 12/17/2016    INTERPRETATION: Single portable frontal view of the chest obtained.  Tracheostomy tube in satisfactory position. Right-sided PICC extends  into the SVC. Stable  cardiomediastinal contour. Bilateral pleural  effusions obscuring the lung bases with hazy opacification of both  lungs. No pneumothorax..          Impression:        Stable examination with bilateral hazy parenchymal  opacification and pleural effusions. Cannot exclude underlying pulmonary  edema.    Filbert Schilder, MD   12/18/2016 8:38 AM            Signed by: Zachery Dauer

## 2016-12-18 NOTE — Progress Notes (Signed)
ICU Daily Progress Note        Date Time: 12/18/16 11:43 AM  Patient Name: [name],[name]  Attending Physician: Gean Quint, MD  Room: [phone]   Admit Date: 12/14/2016  LOS: 4 days       ICU problem list: Respiratory Failure, Pneumonia, Septic Shock, UTI, Afib, HTN    Assessment:   Acute on chronic Respiratory Failure  Pneumonia  Septic Shock  UTI, pseudomonas  GIB, melana per stool  Atrial Fibrillation, CVR  Anemia  Thrombocytopenia  Hx Cspine injury requiring trach/peg    Plan:   On vent, continue support  On meropenem, procalcitonin down to < 0.1, follow cultures  Off pressors on saline ivf. Still marginal bp  PPI bid, recheck stool for occult blood  H/h dropping, monitor, however plt level improved. Holding xarelto for now, may consider heparin drip tomorrow. On amiodarone   On tube feeds.     Code Status: full code    Subjective:   Review of Systems -   ROS      Medications:      Scheduled Meds: PRN Meds:      amiodarone 200 mg Oral Daily   atorvastatin 20 mg Oral QHS   budesonide 0.5 mg Nebulization BID   ciprofloxacin 1 drop Right Eye Q2H WA   glycopyrrolate 1 mg Oral Daily   meropenem 500 mg Intravenous Q6H   metoclopramide 5 mg Oral TID AC   mirtazapine 15 mg Oral QHS   multivitamin 1 tablet Oral Daily   pantoprazole 40 mg Oral Q12H SCH   senna 17.6 mg Oral QHS   sodium chloride (PF) 3 mL Intravenous Q8H       Continuous Infusions:  . sodium chloride 100 mL/hr at 12/18/16 1100   . norepinephrine (LEVOPHED) infusion Stopped (12/17/16 1146)      sodium chloride  PRN   acetaminophen 650 mg Q6H PRN   albuterol-ipratropium 3 mL Q4H PRN   calcium GLUConate 1 g PRN   dextrose 15 g of glucose PRN   And     dextrose 12.5 g PRN   And     glucagon (rDNA) 1 mg PRN   fentaNYL (PF) 50 mcg Q2H PRN   glycopyrrolate 0.2 mg Q8H PRN   LORazepam 0.5 mg Q8H PRN   magnesium sulfate 1 g PRN   ondansetron 4 mg Q8H PRN   potassium chloride 20 mEq PRN   sodium phosphate IVPB 15 mmol PRN   sodium phosphate IVPB 25  mmol PRN   sodium phosphate IVPB 35 mmol PRN         Physical Exam:   Capillary refill = , 2  Physical Exam   Constitutional: He appears well-developed and well-nourished.   obese   HENT:   Head: Normocephalic.   Eyes: Conjunctivae and lids are normal. Pupils are equal, round, and reactive to light.   Neck: No JVD present.   Cardiovascular: Normal rate and normal heart sounds.  An irregularly irregular rhythm present.   Pulses:       Radial pulses are 2+ on the right side, and 2+ on the left side.        Dorsalis pedis pulses are 2+ on the right side, and 2+ on the left side.   Pulmonary/Chest:   Coarse, but no wheezing or rhonchi, trach midline, min secretions noted   Abdominal:   Peg tube, abd distended, + bowel sounds   Genitourinary:   Genitourinary Comments: foley   Neurological: He is  alert. GCS eye subscore is 4.   Patient is deaf, no hearing aids, moving arms off bed  c spine injury       Data:   Invasive ICU Hemodynamics:    IBW/kg (Calculated) : 73    Vent Settings:    IBW: Vent Settings  Vent Mode: PRVC  FiO2: 30 %  Resp Rate (Set): 21  Vt (Set, mL): 500 mL  PIP Observed (cm H2O): 24 cm H2O  PEEP/EPAP: 5 cm H20  Mean Airway Pressure: 11 cmH20    Patient Lines/Drains/Airways Status    Active PICC Line / CVC Line / PIV Line / Drain / Airway / Intraosseous Line / Epidural Line / ART Line / Line / Wound / Pressure Ulcer / NG/OG Tube     Name:   Placement date:   Placement time:   Site:   Days:    PICC Double Lumen 12/17/16 Right Brachial  12/17/16    1330    Brachial    less than 1    Peripheral IV 12/14/16 Right Antecubital  12/14/16    1205    Antecubital    3    Gastrostomy/Enterostomy Gastrostomy RUQ          RUQ        Urethral Catheter 18 Fr.  12/17/16    1759        less than 1    Surgical Airway Shiley 6 mm Cuffed;Long  12/14/16    1855    6 mm    3    Wound 12/14/16 Pressure Injury Sacrum Red open area with drainage  12/14/16    1600    Sacrum    3    Wound 12/17/16 Skin Tear Arm Left;Lower skin tear   12/17/16    0300    Arm    1    Wound 12/17/16 Other (Comment) Leg Anterior  12/17/16    1539    Leg    less than 1                VITAL SIGNS   Temp:  [98.1 F (36.7 C)-99.9 F (37.7 C)] 99.9 F (37.7 C)  Heart Rate:  [93-136] 93  Resp Rate:  [17-40] 21  BP: (82-174)/(50-108) 99/56  Arterial Line BP: (140)/(77) 140/77  FiO2:  [30 %] 30 %  Blood Glucose:  Pulse ox:  Telemetry:afib      Intake/Output Summary (Last 24 hours) at 12/18/16 1143  Last data filed at 12/18/16 1100   Gross per 24 hour   Intake           2197.3 ml   Output              765 ml   Net           1432.3 ml        Labs:     CBC w/Diff CMP     Recent Labs  Lab 12/18/16  0313 12/17/16  0149 12/16/16  1918  12/16/16  0223   WBC 9.97 10.50  --   --  9.18   Hgb 8.4* 9.1* 9.0* More results in Results Review 8.5*   Hematocrit 26.4* 27.9* 27.3* More results in Results Review 26.3*   Platelets 116* 98*  --   --  80*   MCV 85.2 84.0  --   --  84.3   Neutrophils 77.6 80.3  --   --  71.2   More results in Results Review =  values in this interval not displayed.       PT/INR     Recent Labs  Lab 12/17/16  0149 12/16/16  0223 12/15/16  0428   PT INR 1.2* 1.2* 1.7*         Recent Labs  Lab 12/18/16  0313 12/17/16  1212 12/17/16  0149  12/16/16  0223  12/14/16  1207   Sodium 134*  --  134*  --  137 More results in Results Review 138   Potassium 3.9 3.9 3.9 More results in Results Review 3.5 More results in Results Review 5.1   Chloride 104  --  105  --  102 More results in Results Review 87*   CO2 25  --  23  --  28 More results in Results Review 42*   BUN 15.0  --  13.0  --  20.0 More results in Results Review 30.0*   Creatinine 0.7  --  0.6*  --  0.6* More results in Results Review 0.8   Glucose 88  --  110*  --  88 More results in Results Review 119*   Calcium 7.7*  --  7.4*  --  7.6* More results in Results Review 8.6   Magnesium 1.7 2.1 1.7 More results in Results Review 1.6  --  2.0   Phosphorus 3.5 2.5 2.5  --  2.6  --  3.7   Protein, Total  --   --   --    --   --   --  5.8*   Albumin  --   --   --   --   --   --  2.1*   AST (SGOT)  --   --   --   --   --   --  16   ALT  --   --   --   --   --   --  17   Alkaline Phosphatase  --   --   --   --   --   --  65   Bilirubin, Total  --   --   --   --   --   --  0.5   More results in Results Review = values in this interval not displayed.    Recent Labs  Lab 12/14/16  1552 12/14/16  1207   Troponin I <0.01 <0.01       @lababg @   Glucose POCT     Recent Labs  Lab 12/18/16  0313 12/17/16  0149 12/16/16  0223 12/15/16  0428 12/14/16  1207 12/12/16  0530   Glucose 88 110* 88 91 119* 100        Rads:   Radiological Imaging personally reviewed.    I have personally reviewed the patient's history and 24 hour interval events, along with vitals, labs, radiology images.     So far today I have spent 30 critical care minutes providing care for this patient excluding teaching and billable procedures, and not overlapping with any other providers.    Signed by:   Gean Quint, MD    Date/Time: 12/18/16 11:43 AM

## 2016-12-18 NOTE — Progress Notes (Signed)
Per RN patient is married, his wife has capacity and his son reported that patient wife is making health decisions with support from children, it is son's understanding that patient does not have AD. He has financial POA only. Son has provided his sister's contact for consents.   Gaye Alken, LCSW  Palliative Care Social Worker/Therapist  Palliative Care  [phone]

## 2016-12-19 ENCOUNTER — Inpatient Hospital Stay: Payer: Medicare Other

## 2016-12-19 LAB — GLUCOSE WHOLE BLOOD - POCT
Whole Blood Glucose POCT: 105 mg/dL — ABNORMAL HIGH (ref 70–100)
Whole Blood Glucose POCT: 118 mg/dL — ABNORMAL HIGH (ref 70–100)
Whole Blood Glucose POCT: 115 mg/dL — ABNORMAL HIGH (ref 70–100)
Whole Blood Glucose POCT: 106 mg/dL — ABNORMAL HIGH (ref 70–100)

## 2016-12-19 LAB — CBC
Absolute NRBC: 0 10*3/uL
Hematocrit: 25.3 % — ABNORMAL LOW (ref 42.0–52.0)
Hgb: 8.1 g/dL — ABNORMAL LOW (ref 13.0–17.0)
MCH: 27.3 pg — ABNORMAL LOW (ref 28.0–32.0)
MCHC: 32 g/dL (ref 32.0–36.0)
MCV: 85.2 fL (ref 80.0–100.0)
MPV: 11.8 fL (ref 9.4–12.3)
Nucleated RBC: 0 /100 WBC (ref 0.0–1.0)
Platelets: 158 10*3/uL (ref 140–400)
RBC: 2.97 10*6/uL — ABNORMAL LOW (ref 4.70–6.00)
RDW: 16 % — ABNORMAL HIGH (ref 12–15)
WBC: 11.54 10*3/uL — ABNORMAL HIGH (ref 3.50–10.80)

## 2016-12-19 LAB — BASIC METABOLIC PANEL
Anion Gap: 5 (ref 5.0–15.0)
BUN: 20 mg/dL (ref 9.0–28.0)
CO2: 26 mEq/L (ref 22–29)
Calcium: 7.6 mg/dL — ABNORMAL LOW (ref 7.9–10.2)
Chloride: 106 mEq/L (ref 100–111)
Creatinine: 0.6 mg/dL — ABNORMAL LOW (ref 0.7–1.3)
Glucose: 106 mg/dL — ABNORMAL HIGH (ref 70–100)
Potassium: 3.4 mEq/L — ABNORMAL LOW (ref 3.5–5.1)
Sodium: 137 mEq/L (ref 136–145)

## 2016-12-19 LAB — GFR: EGFR: >60

## 2016-12-19 LAB — HEMOLYSIS INDEX: Hemolysis Index: 1 (ref 0–18)

## 2016-12-19 LAB — PT/INR
PT INR: 1.2 — ABNORMAL HIGH (ref 0.9–1.1)
PT: 15.7 s — ABNORMAL HIGH (ref 12.6–15.0)

## 2016-12-19 LAB — APTT: PTT: 31 s (ref 23–37)

## 2016-12-19 LAB — STOOL OCCULT BLOOD: Stool Occult Blood: NEGATIVE

## 2016-12-19 MED ORDER — ALTEPLASE 2 MG IJ SOLR
2.0000 mg | Freq: Once | INTRAMUSCULAR | Status: AC
Start: 2016-12-19 — End: 2016-12-19
  Administered 2016-12-19: 2 mg
  Filled 2016-12-19: qty 2

## 2016-12-19 MED ORDER — ALBUMIN HUMAN 25% IV 50 ML
12.5000 g | Freq: Once | INTRAVENOUS | Status: AC
Start: 2016-12-19 — End: 2016-12-19
  Administered 2016-12-19: 12.5 g via INTRAVENOUS
  Filled 2016-12-19: qty 50

## 2016-12-19 MED ORDER — ENOXAPARIN SODIUM 40 MG/0.4ML SC SOLN
40.0000 mg | Freq: Every day | SUBCUTANEOUS | Status: DC
Start: 2016-12-19 — End: 2016-12-19

## 2016-12-19 MED ORDER — FUROSEMIDE 10 MG/ML IJ SOLN
20.0000 mg | Freq: Once | INTRAMUSCULAR | Status: AC
Start: 2016-12-19 — End: 2016-12-19
  Administered 2016-12-19: 20 mg via INTRAVENOUS
  Filled 2016-12-19: qty 2

## 2016-12-19 MED ORDER — STERILE WATER FOR INJECTION IJ SOLN
INTRAMUSCULAR | Status: AC
Start: 2016-12-19 — End: 2016-12-19
  Filled 2016-12-19: qty 20

## 2016-12-19 MED ORDER — HEPARIN (PORCINE) IN D5W 50-5 UNIT/ML-% IV SOLN (UNITS/KG/HR ONLY)
8.9500 [IU]/kg/h | INTRAVENOUS | Status: DC
Start: 2016-12-19 — End: 2016-12-21
  Administered 2016-12-19 – 2016-12-20 (×2): 8.95 [IU]/kg/h via INTRAVENOUS
  Administered 2016-12-20: 10.95 [IU]/kg/h via INTRAVENOUS
  Filled 2016-12-19 (×2): qty 500

## 2016-12-19 MED ORDER — HEPARIN SODIUM (PORCINE) 5000 UNIT/ML IJ SOLN
3000.0000 [IU] | INTRAMUSCULAR | Status: DC | PRN
Start: 2016-12-19 — End: 2016-12-21
  Administered 2016-12-20 (×2): 3000 [IU] via INTRAVENOUS
  Filled 2016-12-19 (×3): qty 1

## 2016-12-19 NOTE — Plan of Care (Signed)
NP [name] notified regarding patient's DVT in the upper arm and OK to continue to use PICC on Right upper arm.

## 2016-12-19 NOTE — Plan of Care (Signed)
Problem: Inadequate Gas Exchange  Goal: Adequate oxygenation and improved ventilation  Outcome: Progressing      Problem: Non-Violent Restraints Interdisciplinary Plan  Goal: Will be injury free during the use of non-violent restraints  Outcome: Not Progressing   12/19/16 0924   Goal/Interventions addressed this shift   Will be injury free during the use of non-violent restraints Attempt all alternatives before use of restraints;Initiate least restrictive type of restraint that is effective;Provide and maintain safe environment;Ensure safety devices are properly applied and maintained;Document observed patient actions according to protocol;Document significant changes in patient condition;Provide debriefing as soon as possible and appropriate     Pt was agitated and tachycardic.Grimaces.Follows command.Fenatanyl Bolus given to no avail.remains agitated and tachycardic.Lorazepam bolus given with good effect.  Pt less agitated and settled post IV Lorazepam.Remains trached.Settings unchanged.Saturating well.  VSS.Continues on TF.Absorbing well.

## 2016-12-19 NOTE — Progress Notes (Signed)
ICU Daily Progress Note        Date Time: 12/19/16 12:14 PM  Patient Name: [name],[name]  Attending Physician: Gean Quint, MD  Room: [phone]   Admit Date: 12/14/2016  LOS: 5 days       ICU problem list: Respiratory Failure, Pneumonia, Septic Shock, UTI, Afib, HTN    Assessment:   Acute on chronic Respiratory Failure  Pneumonia  Septic Shock  UTI, pseudomonas  GIB, melana per stool  Atrial Fibrillation, CVR  Anemia  Thrombocytopenia  DVT right upper extremity  Hx Cspine injury requiring trach/peg      Plan:   Remains on ventilator, much improved clinically  ID following on fortaz, nebs. Pseudomonas < 100,000 in urine  Off pressors  Will start heparin low dose and continue to hold xarelto  Amiodarone, controlled rate  Follow cbc, transfuse if necessary  Will start with low dose heparin and monitor, picc in place, difficult placement, will monitor for evidence of bleeding, suspect bleeding may have been hemorrhoids on stool softner  Code Status: full code    Subjective:   Review of Systems -   Review of Systems   Unable to perform ROS: Medical condition         Medications:      Scheduled Meds: PRN Meds:        albumin human 12.5 g Intravenous Once   alteplase 2 mg Intracatheter Once   alteplase 2 mg Intracatheter Once   amiodarone 200 mg Oral Daily   atorvastatin 20 mg Oral QHS   budesonide 0.5 mg Nebulization BID   cefTAZidime 2 g Intravenous Q8H SCH   ciprofloxacin 1 drop Right Eye Q2H WA   enoxaparin 40 mg Subcutaneous Daily   furosemide 20 mg Intravenous Once   glycopyrrolate 1 mg Oral Daily   metoclopramide 5 mg Oral TID AC   mirtazapine 15 mg Oral QHS   multivitamin 1 tablet Oral Daily   pantoprazole 40 mg Oral Q12H SCH   senna 17.6 mg Oral QHS   sodium chloride (PF) 3 mL Intravenous Q8H       Continuous Infusions:  . sodium chloride 100 mL/hr at 12/19/16 0600   . norepinephrine (LEVOPHED) infusion Stopped (12/17/16 1146)      sodium chloride  PRN   acetaminophen 650 mg Q6H PRN    albuterol-ipratropium 3 mL Q4H PRN   calcium GLUConate 1 g PRN   dextrose 15 g of glucose PRN   And     dextrose 12.5 g PRN   And     glucagon (rDNA) 1 mg PRN   fentaNYL (PF) 50 mcg Q1H PRN   glycopyrrolate 0.2 mg Q8H PRN   LORazepam 0.5 mg Q8H PRN   magnesium sulfate 1 g PRN   ondansetron 4 mg Q8H PRN   potassium chloride 20 mEq PRN   sodium phosphate IVPB 15 mmol PRN   sodium phosphate IVPB 25 mmol PRN   sodium phosphate IVPB 35 mmol PRN         Physical Exam:   Capillary refill = , 2  Physical Exam   Constitutional: He appears well-developed and well-nourished.   obese   HENT:   Head: Normocephalic.   Eyes: Conjunctivae and lids are normal. Pupils are equal, round, and reactive to light.   Neck: No JVD present.   Cardiovascular: Normal rate and normal heart sounds.  An irregularly irregular rhythm present.   Pulses:       Radial pulses are 2+ on the  right side, and 2+ on the left side.        Dorsalis pedis pulses are 2+ on the right side, and 2+ on the left side.   Pulmonary/Chest:   Coarse, but no wheezing or rhonchi, equal chest expasion bilaterally   Abdominal: Soft. Bowel sounds are normal.   Peg tube in place, tolerating tube feeds   Genitourinary:   Genitourinary Comments: foley   Neurological: He is alert. GCS eye subscore is 4.   Patient is deaf, able to communicate with writing   Skin: Skin is warm and dry. Capillary refill takes 2 to 3 seconds.   Psychiatric:   Improved agitation, calmer today       Data:   Invasive ICU Hemodynamics:    IBW/kg (Calculated) : 73    Vent Settings:    IBW: Vent Settings  Vent Mode: PRVC  FiO2: 30 %  Resp Rate (Set): 20  Vt (Set, mL): 500 mL  PIP Observed (cm H2O): 24 cm H2O  PEEP/EPAP: 5 cm H20  Pressure Support / IPAP: 10 cmH20  Mean Airway Pressure: 14 cmH20    Patient Lines/Drains/Airways Status    Active PICC Line / CVC Line / PIV Line / Drain / Airway / Intraosseous Line / Epidural Line / ART Line / Line / Wound / Pressure Ulcer / NG/OG Tube     Name:   Placement  date:   Placement time:   Site:   Days:    PICC Double Lumen 12/17/16 Right Brachial  12/17/16    1330    Brachial    less than 1    Peripheral IV 12/14/16 Right Antecubital  12/14/16    1205    Antecubital    3    Gastrostomy/Enterostomy Gastrostomy RUQ          RUQ        Urethral Catheter 18 Fr.  12/17/16    1759        less than 1    Surgical Airway Shiley 6 mm Cuffed;Long  12/14/16    1855    6 mm    3    Wound 12/14/16 Pressure Injury Sacrum Red open area with drainage  12/14/16    1600    Sacrum    3    Wound 12/17/16 Skin Tear Arm Left;Lower skin tear  12/17/16    0300    Arm    1    Wound 12/17/16 Other (Comment) Leg Anterior  12/17/16    1539    Leg    less than 1                VITAL SIGNS   Temp:  [97.9 F (36.6 C)-98.9 F (37.2 C)] 98.5 F (36.9 C)  Heart Rate:  [86-119] 97  Resp Rate:  [13-33] 20  BP: (92-123)/(50-90) 112/69  FiO2:  [30 %] 30 %  Blood Glucose:  Pulse ox:  Telemetry:afib      Intake/Output Summary (Last 24 hours) at 12/19/16 1214  Last data filed at 12/19/16 0600   Gross per 24 hour   Intake             2420 ml   Output              765 ml   Net             1655 ml        Labs:     CBC w/Diff CMP  Recent Labs  Lab 12/18/16  0313 12/17/16  0149 12/16/16  1918  12/16/16  0223   WBC 9.97 10.50  --   --  9.18   Hgb 8.4* 9.1* 9.0* More results in Results Review 8.5*   Hematocrit 26.4* 27.9* 27.3* More results in Results Review 26.3*   Platelets 116* 98*  --   --  80*   MCV 85.2 84.0  --   --  84.3   Neutrophils 77.6 80.3  --   --  71.2   More results in Results Review = values in this interval not displayed.       PT/INR     Recent Labs  Lab 12/17/16  0149 12/16/16  0223 12/15/16  0428   PT INR 1.2* 1.2* 1.7*         Recent Labs  Lab 12/18/16  1342 12/18/16  0313 12/17/16  1212 12/17/16  0149  12/16/16  0223  12/14/16  1207   Sodium  --  134*  --  134*  --  137 More results in Results Review 138   Potassium 3.9 3.9 3.9 3.9 More results in Results Review 3.5 More results in Results  Review 5.1   Chloride  --  104  --  105  --  102 More results in Results Review 87*   CO2  --  25  --  23  --  28 More results in Results Review 42*   BUN  --  15.0  --  13.0  --  20.0 More results in Results Review 30.0*   Creatinine  --  0.7  --  0.6*  --  0.6* More results in Results Review 0.8   Glucose  --  88  --  110*  --  88 More results in Results Review 119*   Calcium  --  7.7*  --  7.4*  --  7.6* More results in Results Review 8.6   Magnesium 1.9 1.7 2.1 1.7 More results in Results Review 1.6  --  2.0   Phosphorus  --  3.5 2.5 2.5  --  2.6  --  3.7   Protein, Total  --   --   --   --   --   --   --  5.8*   Albumin  --   --   --   --   --   --   --  2.1*   AST (SGOT)  --   --   --   --   --   --   --  16   ALT  --   --   --   --   --   --   --  17   Alkaline Phosphatase  --   --   --   --   --   --   --  65   Bilirubin, Total  --   --   --   --   --   --   --  0.5   More results in Results Review = values in this interval not displayed.    Recent Labs  Lab 12/14/16  1552 12/14/16  1207   Troponin I <0.01 <0.01       @lababg @   Glucose POCT     Recent Labs  Lab 12/18/16  0313 12/17/16  0149 12/16/16  0223 12/15/16  0428 12/14/16  1207   Glucose 88 110* 88 91 119*  Rads:   Radiological Imaging personally reviewed.    I have personally reviewed the patient's history and 24 hour interval events, along with vitals, labs, radiology images.     So far today I have spent 30 critical care minutes providing care for this patient excluding teaching and billable procedures, and not overlapping with any other providers.    Signed by:     Gean Quint, MD    Date/Time: 12/19/16 12:14 PM

## 2016-12-19 NOTE — Progress Notes (Signed)
INFECTIOUS DISEASES PROGRESS NOTE    Date Time: 12/19/16 10:56 AM  Patient Name: [name],[name]  Patient status.Inpatient  Hospital Day: 5    Assessment and Plan:     1.  Pneumonia; pseudimonas and providencia  2.  CO2 retention.  3.  Chronic respiratory failure.  4.  UTI/pseudomonas    PLAN: 1.  [name], possible [place] am    Subjective:   Trach  Mental status has improved  Review of Systems:   Review of Systems - General ROS: negative    Antibiotics:   Day 6    Other medications reviewed in EPIC.  Central Access:       Physical Exam:   BP 97/52   Pulse 97   Temp 98.2 F (36.8 C) (Oral)   Resp 21   Ht 1.778 m (5\' 10" )   Wt 111.7 kg (246 lb 4.1 oz)   SpO2 93%   BMI 35.33 kg/m @  Vitals:    12/19/16 0950   BP:    Pulse:    Resp:    Temp:    SpO2: 93%       Chest - rhonchi noted With decreased breath sounds  Heart - irregularly irregular rhythm with rate 80  Abdomen - soft, nontender, nondistended, no masses or organomegaly    Labs:   Pro calcitonin less than 0.1  Microbiology Results     Procedure Component Value Units Date/Time    Blood Culture Aerobic/Anaerobic #1 [[id number]] Collected:  12/14/16 1207    Specimen:  Arm from Blood Updated:  12/16/16 1421    Narrative:       ORDER#: [id number]                                    ORDERED BY: NITZBERG, MICHA  SOURCE: Blood arm                                    COLLECTED:  12/14/16 12:07  ANTIBIOTICS AT COLL.:                                RECEIVED :  12/14/16 14:18  Culture Blood Aerobic and Anaerobic        PRELIM      12/16/16 14:21  12/15/16   No Growth after 1 day/s of incubation.  12/16/16   No Growth after 2 day/s of incubation.      Blood Culture Aerobic/Anaerobic #2 [[id number]] Collected:  12/14/16 1318    Specimen:  Arm from Blood Updated:  12/16/16 1944    Narrative:       [name] [id number] called Micro Results of Pos Cx. Results read back by:U [id number], by  [id number] on 12/16/2016 at 19:43  ORDER#: [id number]                                    ORDERED BY: NITZBERG,  MICHA  SOURCE: Blood arm                                    COLLECTED:  12/14/16 13:18  ANTIBIOTICS AT COLL.:  RECEIVED :  12/14/16 17:04  J [id number] called Micro Results of Pos Cx. Results read back by:U [id number], by [id number] on 12/16/2016 at 19:43  Culture Blood Aerobic and Anaerobic        PRELIM      12/16/16 19:44   +  12/15/16   No Growth after 1 day/s of incubation.  12/16/16   No Growth after 2 day/s of incubation.             Anaerobic Blood Culture Positive in 48 to 72 hours             Gram Stain Shows: Gram positive cocci in clusters             Identification to follow      MRSA Culture [[id number]] Collected:  12/14/16 1208    Specimen:  Body Fluid from Nasal/Throat ASC Admission Updated:  12/15/16 1110    Narrative:       ORDER#: [id number]                                    ORDERED BY: [name], MICHA  SOURCE: Nares and Throat                             COLLECTED:  12/14/16 12:08  ANTIBIOTICS AT COLL.:                                RECEIVED :  12/14/16 15:39  Culture MRSA Surveillance                  FINAL       12/15/16 11:10  12/15/16   Negative for Methicillin Resistant Staph aureus from Nares and             Negative for Methicillin Resistant Staph aureus from Throat      MRSA culture (If not done in Triage) [[id number]] Collected:  12/15/16 0428    Specimen:  Body Fluid from Nasal/Throat ASC Admission Updated:  12/16/16 0003    Narrative:       ORDER#: [id number]                                    ORDERED BY: NEIGH, EMILY  SOURCE: Nares and Throat                             COLLECTED:  12/15/16 04:28  ANTIBIOTICS AT COLL.:                                RECEIVED :  12/15/16 06:53  Culture MRSA Surveillance                  FINAL       12/16/16 00:03  12/16/16   Negative for Methicillin Resistant Staph aureus from Nares and             Negative for Methicillin Resistant Staph aureus from Throat      Rapid influenza A/B antigens [[id number]] Collected:  12/14/16 1207     Specimen:  Nasopharyngeal from Nasal Aspirate Updated:  12/14/16 1230    Narrative:  ORDER#: [id number]                                    ORDERED BY: NITZBERG, MICHA  SOURCE: Nasal Aspirate                               COLLECTED:  12/14/16 12:07  ANTIBIOTICS AT COLL.:                                RECEIVED :  12/14/16 12:14  Influenza Rapid Antigen A&B                FINAL       12/14/16 12:29  12/14/16   Negative for Influenza A and B             Reference Range: Negative      Sputum Culture [[id number]] Collected:  12/14/16 1206    Specimen:  Sputum from Tracheal Aspirate Updated:  12/16/16 1622    Narrative:       ORDER#: [id number]                                    ORDERED BY: [name], MICHA  SOURCE: Tracheal Aspirate trach                      COLLECTED:  12/14/16 12:06  ANTIBIOTICS AT COLL.:                                RECEIVED :  12/14/16 16:16  Stain, Gram (Respiratory)                  FINAL       12/14/16 22:22   +  12/14/16   Few Squamous epithelial cells             Few WBC's             Moderate Mixed Respiratory Flora  Culture and Gram Stain, Aerobic, RespiratorPRELIM      12/16/16 16:22   +  12/15/16   Light growth of mixed upper respiratory flora  12/16/16   Heavy growth of Providencia stuartii               Further workup to follow including susceptibility testing    12/16/16   Heavy growth of Pseudomonas aeruginosa               Susceptibility to follow        Urine culture [[id number]] Collected:  12/14/16 1315    Specimen:  Urine from Urine, Catheterized, In & Out Updated:  12/16/16 1025    Narrative:       ORDER#: [id number]                                    ORDERED BY: [name]  SOURCE: Urine, Catheterized, In & Out                COLLECTED:  12/14/16 13:15  ANTIBIOTICS AT COLL.:  RECEIVED :  12/14/16 17:39  Culture Urine                              FINAL       12/16/16 10:25   +  12/16/16   50,000 - 70,000 CFU/ML Pseudomonas  aeruginosa      _____________________________________________________________________________                                  P.aeruginosa    ANTIBIOTICS                     MIC  INTRP      _____________________________________________________________________________  Amikacin                        <=8    S        Aztreonam                        8     S        Cefepime                         4     S        Ceftazidime                      4     S        Ciprofloxacin                  <=0.5   S        Gentamicin                      <=2    S        Levofloxacin                    <=1    S        Meropenem                     <=0.25   S        Piperacillin/Tazobactam         8/4    S        Tobramycin                      <=2    S        _____________________________________________________________________________            S=SUSCEPTIBLE     I=INTERMEDIATE     R=RESISTANT                            N/S=NON-SUSCEPTIBLE  _____________________________________________________________________________            CBC w/Diff CMP     Recent Labs  Lab 12/18/16  0313 12/17/16  0149 12/16/16  1918  12/16/16  0223   WBC 9.97 10.50  --   --  9.18   Hgb 8.4* 9.1* 9.0* More results in Results Review 8.5*   Hematocrit 26.4* 27.9* 27.3* More results in Results Review 26.3*   Platelets 116* 98*  --   --  80*  MCV 85.2 84.0  --   --  84.3   Neutrophils 77.6 80.3  --   --  71.2   More results in Results Review = values in this interval not displayed.    PT/INR     Recent Labs  Lab 12/17/16  0149 12/16/16  0223 12/15/16  0428   PT INR 1.2* 1.2* 1.7*         Recent Labs  Lab 12/18/16  1342 12/18/16  0313 12/17/16  1212 12/17/16  0149  12/16/16  0223  12/14/16  1207   Sodium  --  134*  --  134*  --  137 More results in Results Review 138   Potassium 3.9 3.9 3.9 3.9 More results in Results Review 3.5 More results in Results Review 5.1   Chloride  --  104  --  105  --  102 More results in Results Review 87*   CO2  --  25  --  23  --  28  More results in Results Review 42*   BUN  --  15.0  --  13.0  --  20.0 More results in Results Review 30.0*   Creatinine  --  0.7  --  0.6*  --  0.6* More results in Results Review 0.8   Glucose  --  88  --  110*  --  88 More results in Results Review 119*   Calcium  --  7.7*  --  7.4*  --  7.6* More results in Results Review 8.6   Magnesium 1.9 1.7 2.1 1.7 More results in Results Review 1.6  --  2.0   Phosphorus  --  3.5 2.5 2.5  --  2.6  --  3.7   Protein, Total  --   --   --   --   --   --   --  5.8*   Albumin  --   --   --   --   --   --   --  2.1*   AST (SGOT)  --   --   --   --   --   --   --  16   ALT  --   --   --   --   --   --   --  17   Alkaline Phosphatase  --   --   --   --   --   --   --  65   Bilirubin, Total  --   --   --   --   --   --   --  0.5   More results in Results Review = values in this interval not displayed.   Glucose POCT     Recent Labs  Lab 12/18/16  0313 12/17/16  0149 12/16/16  0223 12/15/16  0428 12/14/16  1207   Glucose 88 110* 88 91 119*          Rads:     Radiology Results (24 Hour)     Procedure Component Value Units Date/Time    XR Chest AP Portable [[id number]] Collected:  12/19/16 0816    Order Status:  Completed Updated:  12/19/16 0821    Narrative:       CLINICAL INDICATION: Dyspnea      INTERPRETATION: Single portable frontal view of the chest obtained.  Tracheostomy tube in satisfactory position. Right-sided PICC extends  into the SVC. Stable cardiomediastinal contour. Bilateral small pleural  effusions obscuring the  lung bases with hazy opacification of both  lungs. No pneumothorax.Marland Kitchen Heart is mildly enlarged. There is  calcification of the aortic arch.      Impression:        Stable examination with bilateral hazy parenchymal  opacification and pleural effusions likely due to CHF/fluid overload  No change from XR CHEST AP PORTABLE with report dated 12/18/2016 8:38 AM.    Laurena Slimmer, MD   12/19/2016 8:17 AM            Signed by: Zachery Dauer

## 2016-12-20 ENCOUNTER — Inpatient Hospital Stay: Payer: Medicare Other

## 2016-12-20 LAB — BASIC METABOLIC PANEL
Anion Gap: 7 (ref 5.0–15.0)
Anion Gap: 7 (ref 5.0–15.0)
BUN: 22 mg/dL (ref 9.0–28.0)
BUN: 22 mg/dL (ref 9.0–28.0)
CO2: 27 mEq/L (ref 22–29)
CO2: 26 mEq/L (ref 22–29)
Calcium: 7.6 mg/dL — ABNORMAL LOW (ref 7.9–10.2)
Calcium: 7.9 mg/dL (ref 7.9–10.2)
Chloride: 105 mEq/L (ref 100–111)
Chloride: 106 mEq/L (ref 100–111)
Creatinine: 0.7 mg/dL (ref 0.7–1.3)
Creatinine: 0.6 mg/dL — ABNORMAL LOW (ref 0.7–1.3)
Glucose: 112 mg/dL — ABNORMAL HIGH (ref 70–100)
Glucose: 133 mg/dL — ABNORMAL HIGH (ref 70–100)
Potassium: 3.1 mEq/L — ABNORMAL LOW (ref 3.5–5.1)
Potassium: 3.6 mEq/L (ref 3.5–5.1)
Sodium: 139 mEq/L (ref 136–145)
Sodium: 139 mEq/L (ref 136–145)

## 2016-12-20 LAB — CBC
Absolute NRBC: 0 10*3/uL
Hematocrit: 25.2 % — ABNORMAL LOW (ref 42.0–52.0)
Hgb: 8.1 g/dL — ABNORMAL LOW (ref 13.0–17.0)
MCH: 27.1 pg — ABNORMAL LOW (ref 28.0–32.0)
MCHC: 32.1 g/dL (ref 32.0–36.0)
MCV: 84.3 fL (ref 80.0–100.0)
MPV: 11.5 fL (ref 9.4–12.3)
Nucleated RBC: 0 /100 WBC (ref 0.0–1.0)
Platelets: 182 10*3/uL (ref 140–400)
RBC: 2.99 10*6/uL — ABNORMAL LOW (ref 4.70–6.00)
RDW: 16 % — ABNORMAL HIGH (ref 12–15)
WBC: 12.09 10*3/uL — ABNORMAL HIGH (ref 3.50–10.80)

## 2016-12-20 LAB — APTT
PTT: 54 s — ABNORMAL HIGH (ref 23–37)
PTT: 39 s — ABNORMAL HIGH (ref 23–37)
PTT: 44 s — ABNORMAL HIGH (ref 23–37)

## 2016-12-20 LAB — GLUCOSE WHOLE BLOOD - POCT
Whole Blood Glucose POCT: 100 mg/dL (ref 70–100)
Whole Blood Glucose POCT: 106 mg/dL — ABNORMAL HIGH (ref 70–100)
Whole Blood Glucose POCT: 112 mg/dL — ABNORMAL HIGH (ref 70–100)
Whole Blood Glucose POCT: 114 mg/dL — ABNORMAL HIGH (ref 70–100)
Whole Blood Glucose POCT: 119 mg/dL — ABNORMAL HIGH (ref 70–100)
Whole Blood Glucose POCT: 122 mg/dL — ABNORMAL HIGH (ref 70–100)
Whole Blood Glucose POCT: 126 mg/dL — ABNORMAL HIGH (ref 70–100)

## 2016-12-20 LAB — HEMOLYSIS INDEX
Hemolysis Index: 1 (ref 0–18)
Hemolysis Index: 8 (ref 0–18)

## 2016-12-20 LAB — GFR
EGFR: >60
EGFR: >60

## 2016-12-20 MED ORDER — VANCOMYCIN HCL 1000 MG IV SOLR
1750.0000 mg | INTRAVENOUS | Status: DC
Start: 2016-12-21 — End: 2016-12-21
  Filled 2016-12-20: qty 1750

## 2016-12-20 MED ORDER — FUROSEMIDE 10 MG/ML IJ SOLN
20.0000 mg | Freq: Once | INTRAMUSCULAR | Status: AC
Start: 2016-12-20 — End: 2016-12-20
  Administered 2016-12-20: 20 mg via INTRAVENOUS
  Filled 2016-12-20: qty 2

## 2016-12-20 MED ORDER — VANCOMYCIN HCL 1000 MG IV SOLR
2000.0000 mg | Freq: Once | INTRAVENOUS | Status: AC
Start: 2016-12-20 — End: 2016-12-20
  Administered 2016-12-20: 2000 mg via INTRAVENOUS
  Filled 2016-12-20: qty 2000

## 2016-12-20 NOTE — Progress Notes (Signed)
Pt maintained on Heparin low intensity aptt 39 sec given Heparin 3000 units and drip increased by 2 units/kg/h rrate now @ 10.95 unit/kg/hr, next aptt to be drawn at 2100. Pt still on G tube high Vital @ 50 ml/hr.  Pt tolerating feeds well no residual noted. Pt turned and repositioned Q 2 hrs to prevent further skin impairment.

## 2016-12-20 NOTE — Progress Notes (Signed)
ICU Daily Progress Note        Date Time: 12/20/16 10:44 AM  Patient Name: [name],[name]  Attending Physician: Gean Quint, MD  Room: [phone]   Admit Date: 12/14/2016  LOS: 6 days       ICU problem list: Respiratory Failure, Pneumonia, Septic Shock, UTI, Afib, HTN    Assessment:   Acute on chronic Respiratory Failure  Pneumonia, pseudomonas and providencia  Septic Shock  UTI, pseudomonas  GIB, melana per stool  Atrial Fibrillation, CVR  Anemia  Thrombocytopenia, resolved  DVT right upper extremity  Hx Cspine injury requiring trach/peg      Plan:   Vent, titrate oxygen for sat > 94%, dropping sats, will dose lasix again stop IVF  Fortaz, no fever follow wbc slight increase to 12, ID following. Bacteria sensitive to fortaz. Maybe increase due to DVT?, clinically improving.   No pressors  Fortaz  Stool occult blood negative  Amiodarone, heparin drip for now, no further melena  Monitor plt  picc in place, patient difficult for phelbotomy and IV access    Code Status: full code    Subjective:   Review of Systems -   Review of Systems   Unable to perform ROS: Medical condition         Medications:      Scheduled Meds: PRN Meds:        amiodarone 200 mg Oral Daily   atorvastatin 20 mg Oral QHS   budesonide 0.5 mg Nebulization BID   cefTAZidime 2 g Intravenous Q8H SCH   ciprofloxacin 1 drop Right Eye Q2H WA   furosemide 20 mg Intravenous Once   glycopyrrolate 1 mg Oral Daily   metoclopramide 5 mg Oral TID AC   mirtazapine 15 mg Oral QHS   multivitamin 1 tablet Oral Daily   pantoprazole 40 mg Oral Q12H SCH   senna 17.6 mg Oral QHS   sodium chloride (PF) 3 mL Intravenous Q8H       Continuous Infusions:  . heparin infusion 25,000 units/500 mL 8.95 Units/kg/hr (12/20/16 0744)      sodium chloride  PRN   acetaminophen 650 mg Q6H PRN   albuterol-ipratropium 3 mL Q4H PRN   calcium GLUConate 1 g PRN   dextrose 15 g of glucose PRN   And     dextrose 12.5 g PRN   And     glucagon (rDNA) 1 mg PRN   fentaNYL (PF) 50  mcg Q1H PRN   glycopyrrolate 0.2 mg Q8H PRN   heparin (porcine) 3,000 Units PRN   LORazepam 0.5 mg Q8H PRN   magnesium sulfate 1 g PRN   ondansetron 4 mg Q8H PRN   potassium chloride 20 mEq PRN   sodium phosphate IVPB 15 mmol PRN   sodium phosphate IVPB 25 mmol PRN   sodium phosphate IVPB 35 mmol PRN         Physical Exam:   Capillary refill = , 2  Physical Exam   Constitutional: He appears well-developed and well-nourished.   obese   HENT:   Head: Normocephalic.   Eyes: Conjunctivae and lids are normal. Pupils are equal, round, and reactive to light.   Neck: No JVD present.   Cardiovascular: Normal rate and normal heart sounds.  An irregularly irregular rhythm present.   Pulses:       Radial pulses are 2+ on the right side, and 2+ on the left side.        Dorsalis pedis pulses are 2+ on  the right side, and 2+ on the left side.   Afib, anasarca improved   Pulmonary/Chest:   Coarse, dropped sat to 74 while on vent   Abdominal: Soft. Bowel sounds are normal. There is no tenderness.   Peg tube in place, tolerating tube feeds   Genitourinary: Right testis shows no swelling.   Genitourinary Comments: foley   Neurological: He is alert. GCS eye subscore is 4.   Patient is deaf, able to communicate with writing   Skin: Skin is warm and dry. Capillary refill takes 2 to 3 seconds.   Psychiatric:   Improved agitation, calmer today       Data:   Invasive ICU Hemodynamics:    IBW/kg (Calculated) : 73    Vent Settings:    IBW: Vent Settings  Vent Mode: PRVC  FiO2: 30 %  Resp Rate (Set): 20  Vt (Set, mL): 500 mL  PIP Observed (cm H2O): 26 cm H2O  PEEP/EPAP: 5 cm H20  Pressure Support / IPAP: 10 cmH20  Mean Airway Pressure: 11 cmH20    Patient Lines/Drains/Airways Status    Active PICC Line / CVC Line / PIV Line / Drain / Airway / Intraosseous Line / Epidural Line / ART Line / Line / Wound / Pressure Ulcer / NG/OG Tube     Name:   Placement date:   Placement time:   Site:   Days:    PICC Double Lumen 12/17/16 Right Brachial   12/17/16    1330    Brachial    less than 1    Peripheral IV 12/14/16 Right Antecubital  12/14/16    1205    Antecubital    3    Gastrostomy/Enterostomy Gastrostomy RUQ          RUQ        Urethral Catheter 18 Fr.  12/17/16    1759        less than 1    Surgical Airway Shiley 6 mm Cuffed;Long  12/14/16    1855    6 mm    3    Wound 12/14/16 Pressure Injury Sacrum Red open area with drainage  12/14/16    1600    Sacrum    3    Wound 12/17/16 Skin Tear Arm Left;Lower skin tear  12/17/16    0300    Arm    1    Wound 12/17/16 Other (Comment) Leg Anterior  12/17/16    1539    Leg    less than 1                VITAL SIGNS   Temp:  [98.3 F (36.8 C)-99.2 F (37.3 C)] 98.4 F (36.9 C)  Heart Rate:  [88-122] 89  Resp Rate:  [11-27] 20  BP: (97-152)/(53-102) 98/64  FiO2:  [30 %] 30 %  Blood Glucose:  Pulse ox:  Telemetry:afib cvr      Intake/Output Summary (Last 24 hours) at 12/20/16 1044  Last data filed at 12/20/16 0600   Gross per 24 hour   Intake          2924.33 ml   Output             2115 ml   Net           809.33 ml        Labs:     CBC w/Diff CMP     Recent Labs  Lab 12/20/16  0140 12/19/16  0307 12/18/16  3016 12/17/16  0149  12/16/16  0223   WBC 12.09* 11.54* 9.97 10.50  --  9.18   Hgb 8.1* 8.1* 8.4* 9.1* More results in Results Review 8.5*   Hematocrit 25.2* 25.3* 26.4* 27.9* More results in Results Review 26.3*   Platelets 182 158 116* 98*  --  80*   MCV 84.3 85.2 85.2 84.0  --  84.3   Neutrophils  --   --  77.6 80.3  --  71.2   More results in Results Review = values in this interval not displayed.       PT/INR     Recent Labs  Lab 12/19/16  1547 12/17/16  0149 12/16/16  0223   PT INR 1.2* 1.2* 1.2*         Recent Labs  Lab 12/20/16  0140 12/19/16  0307 12/18/16  1342 12/18/16  0313 12/17/16  1212 12/17/16  0149  12/14/16  1207   Sodium 139 137  --  134*  --  134* More results in Results Review 138   Potassium 3.1* 3.4* 3.9 3.9 3.9 3.9 More results in Results Review 5.1   Chloride 105 106  --  104  --  105  More results in Results Review 87*   CO2 27 26  --  25  --  23 More results in Results Review 42*   BUN 22.0 20.0  --  15.0  --  13.0 More results in Results Review 30.0*   Creatinine 0.7 0.6*  --  0.7  --  0.6* More results in Results Review 0.8   Glucose 112* 106*  --  88  --  110* More results in Results Review 119*   Calcium 7.6* 7.6*  --  7.7*  --  7.4* More results in Results Review 8.6   Magnesium  --   --  1.9 1.7 2.1 1.7 More results in Results Review 2.0   Phosphorus  --   --   --  3.5 2.5 2.5 More results in Results Review 3.7   Protein, Total  --   --   --   --   --   --   --  5.8*   Albumin  --   --   --   --   --   --   --  2.1*   AST (SGOT)  --   --   --   --   --   --   --  16   ALT  --   --   --   --   --   --   --  17   Alkaline Phosphatase  --   --   --   --   --   --   --  65   Bilirubin, Total  --   --   --   --   --   --   --  0.5   More results in Results Review = values in this interval not displayed.    Recent Labs  Lab 12/14/16  1552 12/14/16  1207   Troponin I <0.01 <0.01       @lababg @   Glucose POCT     Recent Labs  Lab 12/20/16  0140 12/19/16  0307 12/18/16  0313 12/17/16  0149 12/16/16  0223 12/15/16  0428 12/14/16  1207   Glucose 112* 106* 88 110* 88 91 119*        Rads:   Radiological Imaging personally  reviewed.    I have personally reviewed the patient's history and 24 hour interval events, along with vitals, labs, radiology images.     So far today I have spent 30 critical care minutes providing care for this patient excluding teaching and billable procedures, and not overlapping with any other providers.    Signed by:     Lattie Haw, MD    Date/Time: 12/20/16 10:44 AM

## 2016-12-20 NOTE — Progress Notes (Signed)
INFECTIOUS DISEASES PROGRESS NOTE    Date Time: 12/20/16 3:00 PM  Patient Name: [name], [name]  Patient status.Inpatient  Hospital Day: 6    Assessment and Plan:     1.  Pneumonia; pseudimonas and providencia  2.  CO2 retention.  3.  Chronic respiratory failure.  4.  UTI/pseudomonas  5. DVT RUE    PLAN: 1.  Fortaz  2. Repeat blood C&S then Vanco  Subjective:   Trach  Mental status has improved  Review of Systems:   Review of Systems - General ROS: negative    Antibiotics:   Day 7    Other medications reviewed in EPIC.  Central Access:       Physical Exam:   BP 98/64   Pulse 89   Temp 99.5 F (37.5 C) (Oral)   Resp 20   Ht 1.778 m (5\' 10" )   Wt 115.2 kg (253 lb 15.5 oz)   SpO2 99%   BMI 36.44 kg/m @  Vitals:    12/20/16 1420   BP:    Pulse:    Resp:    Temp:    SpO2: 99%       Chest - rhonchi noted With decreased breath sounds  Heart - irregularly irregular rhythm with rate 80  Abdomen - soft, nontender, nondistended, no masses or organomegaly    Labs:   Pro calcitonin less than 0.1  Microbiology Results     Procedure Component Value Units Date/Time    Blood Culture Aerobic/Anaerobic #1 [[id number]] Collected:  12/14/16 1207    Specimen:  Arm from Blood Updated:  12/16/16 1421    Narrative:       ORDER#: [id number]                                    ORDERED BY: NITZBERG, MICHA  SOURCE: Blood arm                                    COLLECTED:  12/14/16 12:07  ANTIBIOTICS AT COLL.:                                RECEIVED :  12/14/16 14:18  Culture Blood Aerobic and Anaerobic        PRELIM      12/16/16 14:21  12/15/16   No Growth after 1 day/s of incubation.  12/16/16   No Growth after 2 day/s of incubation.      Blood Culture Aerobic/Anaerobic #2 [[id number]] Collected:  12/14/16 1318    Specimen:  Arm from Blood Updated:  12/16/16 1944    Narrative:       [name] [id number] called Micro Results of Pos Cx. Results read back by:U [id number], by  [id number] on 12/16/2016 at 19:43  ORDER#: [id number]                                     ORDERED BY: NITZBERG, MICHA  SOURCE: Blood arm                                    COLLECTED:  12/14/16 13:18  ANTIBIOTICS AT COLL.:  RECEIVED :  12/14/16 17:04  J [id number] called Micro Results of Pos Cx. Results read back by:U [id number], by [id number] on 12/16/2016 at 19:43  Culture Blood Aerobic and Anaerobic        PRELIM      12/16/16 19:44   +  12/15/16   No Growth after 1 day/s of incubation.  12/16/16   No Growth after 2 day/s of incubation.             Anaerobic Blood Culture Positive in 48 to 72 hours             Gram Stain Shows: Gram positive cocci in clusters             Identification to follow      MRSA Culture [[id number]] Collected:  12/14/16 1208    Specimen:  Body Fluid from Nasal/Throat ASC Admission Updated:  12/15/16 1110    Narrative:       ORDER#: [id number]                                    ORDERED BY: [name], MICHA  SOURCE: Nares and Throat                             COLLECTED:  12/14/16 12:08  ANTIBIOTICS AT COLL.:                                RECEIVED :  12/14/16 15:39  Culture MRSA Surveillance                  FINAL       12/15/16 11:10  12/15/16   Negative for Methicillin Resistant Staph aureus from Nares and             Negative for Methicillin Resistant Staph aureus from Throat      MRSA culture (If not done in Triage) [[id number]] Collected:  12/15/16 0428    Specimen:  Body Fluid from Nasal/Throat ASC Admission Updated:  12/16/16 0003    Narrative:       ORDER#: [id number]                                    ORDERED BY: NEIGH, EMILY  SOURCE: Nares and Throat                             COLLECTED:  12/15/16 04:28  ANTIBIOTICS AT COLL.:                                RECEIVED :  12/15/16 06:53  Culture MRSA Surveillance                  FINAL       12/16/16 00:03  12/16/16   Negative for Methicillin Resistant Staph aureus from Nares and             Negative for Methicillin Resistant Staph aureus from Throat      Rapid influenza A/B antigens [[id number]] Collected:   12/14/16 1207    Specimen:  Nasopharyngeal from Nasal Aspirate Updated:  12/14/16 1230    Narrative:  ORDER#: [id number]                                    ORDERED BY: NITZBERG, MICHA  SOURCE: Nasal Aspirate                               COLLECTED:  12/14/16 12:07  ANTIBIOTICS AT COLL.:                                RECEIVED :  12/14/16 12:14  Influenza Rapid Antigen A&B                FINAL       12/14/16 12:29  12/14/16   Negative for Influenza A and B             Reference Range: Negative      Sputum Culture [[id number]] Collected:  12/14/16 1206    Specimen:  Sputum from Tracheal Aspirate Updated:  12/16/16 1622    Narrative:       ORDER#: [id number]                                    ORDERED BY: [name], MICHA  SOURCE: Tracheal Aspirate trach                      COLLECTED:  12/14/16 12:06  ANTIBIOTICS AT COLL.:                                RECEIVED :  12/14/16 16:16  Stain, Gram (Respiratory)                  FINAL       12/14/16 22:22   +  12/14/16   Few Squamous epithelial cells             Few WBC's             Moderate Mixed Respiratory Flora  Culture and Gram Stain, Aerobic, RespiratorPRELIM      12/16/16 16:22   +  12/15/16   Light growth of mixed upper respiratory flora  12/16/16   Heavy growth of Providencia stuartii               Further workup to follow including susceptibility testing    12/16/16   Heavy growth of Pseudomonas aeruginosa               Susceptibility to follow        Urine culture [[id number]] Collected:  12/14/16 1315    Specimen:  Urine from Urine, Catheterized, In & Out Updated:  12/16/16 1025    Narrative:       ORDER#: [id number]                                    ORDERED BY: [name]  SOURCE: Urine, Catheterized, In & Out                COLLECTED:  12/14/16 13:15  ANTIBIOTICS AT COLL.:  RECEIVED :  12/14/16 17:39  Culture Urine                              FINAL       12/16/16 10:25   +  12/16/16   50,000 - 70,000 CFU/ML Pseudomonas  aeruginosa      _____________________________________________________________________________                                  P.aeruginosa    ANTIBIOTICS                     MIC  INTRP      _____________________________________________________________________________  Amikacin                        <=8    S        Aztreonam                        8     S        Cefepime                         4     S        Ceftazidime                      4     S        Ciprofloxacin                  <=0.5   S        Gentamicin                      <=2    S        Levofloxacin                    <=1    S        Meropenem                     <=0.25   S        Piperacillin/Tazobactam         8/4    S        Tobramycin                      <=2    S        _____________________________________________________________________________            S=SUSCEPTIBLE     I=INTERMEDIATE     R=RESISTANT                            N/S=NON-SUSCEPTIBLE  _____________________________________________________________________________            CBC w/Diff CMP     Recent Labs  Lab 12/20/16  0140 12/19/16  0307 12/18/16  0313 12/17/16  0149  12/16/16  0223   WBC 12.09* 11.54* 9.97 10.50  --  9.18   Hgb 8.1* 8.1* 8.4* 9.1* More results in Results Review 8.5*   Hematocrit 25.2* 25.3* 26.4* 27.9* More results in Results Review 26.3*   Platelets 182 158 116* 98*  --  80*   MCV 84.3 85.2 85.2 84.0  --  84.3   Neutrophils  --   --  77.6 80.3  --  71.2   More results in Results Review = values in this interval not displayed.    PT/INR     Recent Labs  Lab 12/19/16  1547 12/17/16  0149 12/16/16  0223   PT INR 1.2* 1.2* 1.2*         Recent Labs  Lab 12/20/16  1327 12/20/16  0140 12/19/16  0307 12/18/16  1342 12/18/16  0313 12/17/16  1212 12/17/16  0149  12/14/16  1207   Sodium 139 139 137  --  134*  --  134* More results in Results Review 138   Potassium 3.6 3.1* 3.4* 3.9 3.9 3.9 3.9 More results in Results Review 5.1   Chloride 106 105 106  --  104  --  105  More results in Results Review 87*   CO2 26 27 26   --  25  --  23 More results in Results Review 42*   BUN 22.0 22.0 20.0  --  15.0  --  13.0 More results in Results Review 30.0*   Creatinine 0.6* 0.7 0.6*  --  0.7  --  0.6* More results in Results Review 0.8   Glucose 133* 112* 106*  --  88  --  110* More results in Results Review 119*   Calcium 7.9 7.6* 7.6*  --  7.7*  --  7.4* More results in Results Review 8.6   Magnesium  --   --   --  1.9 1.7 2.1 1.7 More results in Results Review 2.0   Phosphorus  --   --   --   --  3.5 2.5 2.5 More results in Results Review 3.7   Protein, Total  --   --   --   --   --   --   --   --  5.8*   Albumin  --   --   --   --   --   --   --   --  2.1*   AST (SGOT)  --   --   --   --   --   --   --   --  16   ALT  --   --   --   --   --   --   --   --  17   Alkaline Phosphatase  --   --   --   --   --   --   --   --  65   Bilirubin, Total  --   --   --   --   --   --   --   --  0.5   More results in Results Review = values in this interval not displayed.   Glucose POCT     Recent Labs  Lab 12/20/16  1327 12/20/16  0140 12/19/16  0307 12/18/16  0313 12/17/16  0149 12/16/16  0223 12/15/16  0428   Glucose 133* 112* 106* 88 110* 88 91          Rads:     Radiology Results (24 Hour)     Procedure Component Value Units Date/Time    XR Chest AP Portable [[id number]] Collected:  12/20/16 0751    Order Status:  Completed Updated:  12/20/16 0756    Narrative:       HISTORY: Status  post PICC line insertion, evaluate position,  tracheostomy tube in position    COMPARISON: Prior chest x-rays going back to January 8    FINDINGS: Single semierect portable radiograph of the chest was  performed.    There is a PICC line seen with the tip in the SVC. There is no  pneumothorax or complication noted.    There is persistent hypoventilation of both lung fields with bilateral  pleural effusions. The upper lobe pulmonary veins are distended.    Tracheostomy tube is seen in position in the airway is unchanged.  No  complication is seen      Impression:         1. There is no overall change in the patients pleural effusion and  hypoventilation bilaterally.  2. PICC line is noted in position without complication.    Charlene Brooke, MD   12/20/2016 7:52 AM            Signed by: Zachery Dauer

## 2016-12-20 NOTE — Progress Notes (Signed)
Pharmacy Note: Vancomycin Dosing & MONITORING      Height Weight Renal Function   1.778 m (5\' 10" ) 115.2 kg (253 lb 15.5 oz) Serum creatinine: 0.6 mg/dL Low 45/80/99 8338  Estimated creatinine clearance: 122.8 mL/min     99.5 F (37.5 C) (Oral)   Recent Labs     Lab 12/20/16  1327 12/20/16  0140 12/19/16  0307 12/18/16  0313 12/17/16  0149 12/16/16  1451 12/16/16  0223   WBC --  12.09* 11.54* 9.97 10.50 --  9.18   Creatinine 0.6* 0.7 0.6* 0.7 0.6* --  0.6*   Vancomycin Trough --  --  --  --  --  9.5* --        Pharmacy was consulted on 1/11 by Dr. Venetia Constable to dose vancomycin.  Vancomycin Indication: Staphylococcus bacteremia  Current regimen: vancomycin 2g x1, 1750 q24h  Goal Trough: 15-20 mcg/mL    Indication Goal Trough   UTI / Mild Cellulitis 10-15 mcg/mL   All Other Indications 15-20 mcg/mL   Pre-HD 15-25 mcg/mL     Cultures & Other Relevant Tests:   Blood- Growth of Staphylococcus epidermidis   Day of Therapy: 1  Other Antimicrobials: Meropenem  Other Nephrotoxic Agents/Conditions:  Advanced Age (>65Y), Critical Illness/Hypotension  Did patient receive intravenous contrast within the last 72 hours? No    Date 1/11  1/12       Administered  Dose (time) 2000 mg   16:23 1750 q24h       Level (time)           Assessment/Plan:  1. Renal function (CrCl/HD/CRRT/Nephrotoxic agents): Renal function is stable, Scr 0.6  2. Trough/random level assessment: Pt was previously on vancomycin- trough 9.5 on 12/16/16 prior to 3rd dose   3. Vancomycin dosing plan:       Initial loading dose (17 mg/kg) = 2000 mg      Maintenance regimen (15 mg/kg) = 1750 mg Q 24 hr  4. Continue to monitor renal function, WBC, temp, microbiologic data, missed doses, and signs/symptoms of adverse reaction.  5. De-escalation plan: Pt was previously on vancomycin 1500 mg q24h (1/5-1/7) and the trough level was subtherapeutic at 9.5 prior to 3rd dose (approximately 90% at steady state). Although the level was subtherapeutic, dose will be increased by ~250  mg given that the patient is elderly and there are no documented MRSA cultures.     Thank you for this consult. Pharmacy will continue to follow this patient's progress with you until consult and/or vancomycin is discontinued.      Glorious Peach, Pharm.D.  Phone: [phone]

## 2016-12-20 NOTE — Plan of Care (Signed)
Problem: Artificial Airway  Goal: Tracheostomy will be maintained  Outcome: Progressing   12/20/16 1855   Goal/Interventions addressed this shift   Tracheostomy will be maintained Suction secretions as needed;Keep head of bed at 30 degrees, unless contraindicated;Perform deep oropharyngeal suctioning at least every 4 hours;Apply water-based moisturizer to lips;Utilize tracheostomy securing device;Support ventilator tubing to avoid pressure from drag of tubing;Tracheostomy care every shift and as needed;Maintain surgical airway kit or tracheostomy tray at bedside;Keep additional tracheostomy tube of the same size and one size smaller at bedside

## 2016-12-20 NOTE — Plan of Care (Addendum)
Problem: Pain  Goal: Pain at adequate level as identified by patient  Outcome: Progressing   12/20/16 0456   Goal/Interventions addressed this shift   Pain at adequate level as identified by patient Identify patient comfort function goal;Assess for risk of opioid induced respiratory depression, including snoring/sleep apnea. Alert healthcare team of risk factors identified.;Reassess pain within 30-60 minutes of any procedure/intervention, per Pain Assessment, Intervention, Reassessment (AIR) Cycle;Assess pain on admission, during daily assessment and/or before any "as needed" intervention(s);Evaluate if patient comfort function goal is met;Evaluate patient's satisfaction with pain management progress;Offer non-pharmacological pain management interventions   Alert.Mouthing words but difficult to understand.Agitated/restless.Responds to closed questions by nodding appropriately.  Fentanyl given for generalized pain and Ativan given with good effect.Slept in between interventions.  Remains ventilated via trache.Saturating well.No spontaneous breathing effort.regular oral care done.  Still in Afib ,controlled rate.BP stable.  Potassium replaced for K 3.1.Good urine output.TF continues and absorbing well.  Trache care done.Slight redness to site.Foam dressing applied and gray band renewed and secured.    Problem: Inadequate Gas Exchange  Goal: Adequate oxygenation and improved ventilation  Outcome: Progressing   12/20/16 0456   Goal/Interventions addressed this shift   Adequate oxygenation and improved ventilation Assess lung sounds;Monitor SpO2 and treat as needed;Provide mechanical and oxygen support to facilitate gas exchange;Position for maximum ventilatory efficiency;Consult/collaborate with Respiratory Therapy     Goal: Patent Airway maintained  Outcome: Progressing   12/20/16 0456   Goal/Interventions addressed this shift   Patent airway maintained  Position patient for maximum ventilatory efficiency;Provide  adequate fluid intake to liquefy secretions;Suction secretions as needed;Reinforce use of ordered respiratory interventions (i.e. CPAP, BiPAP, Incentive Spirometer, Acapella, etc.);Reposition patient every 2 hours and as needed unless able to self-reposition       Problem: Non-Violent Restraints Interdisciplinary Plan  Goal: Will be injury free during the use of non-violent restraints  Outcome: Not Progressing   12/20/16 0456   Goal/Interventions addressed this shift   Will be injury free during the use of non-violent restraints Attempt all alternatives before use of restraints;Initiate least restrictive type of restraint that is effective;Provide and maintain safe environment;Notify family of initiation of restraints;Include patient/family/caregiver in decisions related to safety;Ensure safety devices are properly applied and maintained;Document observed patient actions according to protocol

## 2016-12-20 NOTE — Consults (Signed)
VARD Consult X 2 days. Per [facility] Charge RN Jan 11,18: Current PICC is functional > 24hrs and free of complications. Additional access not necessary or recommended at this time. VARD to continue following/consulting

## 2016-12-21 ENCOUNTER — Inpatient Hospital Stay: Payer: Medicare Other

## 2016-12-21 LAB — CBC
Absolute NRBC: 0 10*3/uL
Absolute NRBC: 0 10*3/uL
Hematocrit: 25.6 % — ABNORMAL LOW (ref 42.0–52.0)
Hematocrit: 28 % — ABNORMAL LOW (ref 42.0–52.0)
Hgb: 8 g/dL — ABNORMAL LOW (ref 13.0–17.0)
Hgb: 8.8 g/dL — ABNORMAL LOW (ref 13.0–17.0)
MCH: 26.8 pg — ABNORMAL LOW (ref 28.0–32.0)
MCH: 27.3 pg — ABNORMAL LOW (ref 28.0–32.0)
MCHC: 31.3 g/dL — ABNORMAL LOW (ref 32.0–36.0)
MCHC: 31.4 g/dL — ABNORMAL LOW (ref 32.0–36.0)
MCV: 85.9 fL (ref 80.0–100.0)
MCV: 87 fL (ref 80.0–100.0)
MPV: 11.4 fL (ref 9.4–12.3)
MPV: 11.1 fL (ref 9.4–12.3)
Nucleated RBC: 0 /100 WBC (ref 0.0–1.0)
Nucleated RBC: 0 /100 WBC (ref 0.0–1.0)
Platelets: 286 10*3/uL (ref 140–400)
Platelets: 338 10*3/uL (ref 140–400)
RBC: 2.98 10*6/uL — ABNORMAL LOW (ref 4.70–6.00)
RBC: 3.22 10*6/uL — ABNORMAL LOW (ref 4.70–6.00)
RDW: 16 % — ABNORMAL HIGH (ref 12–15)
RDW: 16 % — ABNORMAL HIGH (ref 12–15)
WBC: 11.48 10*3/uL — ABNORMAL HIGH (ref 3.50–10.80)
WBC: 13.82 10*3/uL — ABNORMAL HIGH (ref 3.50–10.80)

## 2016-12-21 LAB — APTT
PTT: 72 s — ABNORMAL HIGH (ref 23–37)
PTT: 56 s — ABNORMAL HIGH (ref 23–37)

## 2016-12-21 LAB — BASIC METABOLIC PANEL
Anion Gap: 6 (ref 5.0–15.0)
Anion Gap: 7 (ref 5.0–15.0)
BUN: 19 mg/dL (ref 9.0–28.0)
BUN: 19 mg/dL (ref 9.0–28.0)
CO2: 28 mEq/L (ref 22–29)
CO2: 25 mEq/L (ref 22–29)
Calcium: 7.8 mg/dL — ABNORMAL LOW (ref 7.9–10.2)
Calcium: 8.2 mg/dL (ref 7.9–10.2)
Chloride: 107 mEq/L (ref 100–111)
Chloride: 108 mEq/L (ref 100–111)
Creatinine: 0.6 mg/dL — ABNORMAL LOW (ref 0.7–1.3)
Creatinine: 0.5 mg/dL — ABNORMAL LOW (ref 0.7–1.3)
Glucose: 88 mg/dL (ref 70–100)
Glucose: 106 mg/dL — ABNORMAL HIGH (ref 70–100)
Potassium: 3.2 mEq/L — ABNORMAL LOW (ref 3.5–5.1)
Potassium: 4.5 mEq/L (ref 3.5–5.1)
Sodium: 141 mEq/L (ref 136–145)
Sodium: 140 mEq/L (ref 136–145)

## 2016-12-21 LAB — GLUCOSE WHOLE BLOOD - POCT
Whole Blood Glucose POCT: 114 mg/dL — ABNORMAL HIGH (ref 70–100)
Whole Blood Glucose POCT: 91 mg/dL (ref 70–100)
Whole Blood Glucose POCT: 114 mg/dL — ABNORMAL HIGH (ref 70–100)
Whole Blood Glucose POCT: 111 mg/dL — ABNORMAL HIGH (ref 70–100)
Whole Blood Glucose POCT: 106 mg/dL — ABNORMAL HIGH (ref 70–100)

## 2016-12-21 LAB — HEMOLYSIS INDEX
Hemolysis Index: 5 (ref 0–18)
Hemolysis Index: 6 (ref 0–18)

## 2016-12-21 LAB — GFR
EGFR: >60
EGFR: >60

## 2016-12-21 MED ORDER — HEPARIN SODIUM (PORCINE) 5000 UNIT/ML IJ SOLN
80.0000 [IU]/kg | INTRAMUSCULAR | Status: DC | PRN
Start: 2016-12-21 — End: 2016-12-24

## 2016-12-21 MED ORDER — HEPARIN (PORCINE) IN D5W 50-5 UNIT/ML-% IV SOLN (UNITS/KG/HR ONLY)
18.0000 [IU]/kg/h | INTRAVENOUS | Status: DC
Start: 2016-12-21 — End: 2016-12-24
  Administered 2016-12-21: 18 [IU]/kg/h via INTRAVENOUS
  Administered 2016-12-22 (×2): 17 [IU]/kg/h via INTRAVENOUS
  Administered 2016-12-23 (×2): 16 [IU]/kg/h via INTRAVENOUS
  Filled 2016-12-21 (×5): qty 500

## 2016-12-21 MED ORDER — PANTOPRAZOLE ORAL SUSPENSION 40 MG/20 ML UD
40.0000 mg | Freq: Every day | ORAL | Status: DC
Start: 2016-12-22 — End: 2016-12-27
  Administered 2016-12-22 – 2016-12-27 (×6): 40 mg via ORAL
  Filled 2016-12-21 (×6): qty 20

## 2016-12-21 MED ORDER — HEPARIN SODIUM (PORCINE) 5000 UNIT/ML IJ SOLN
40.0000 [IU]/kg | INTRAMUSCULAR | Status: DC | PRN
Start: 2016-12-21 — End: 2016-12-24
  Administered 2016-12-21 – 2016-12-23 (×2): 4500 [IU] via INTRAVENOUS
  Filled 2016-12-21 (×2): qty 1

## 2016-12-21 NOTE — Progress Notes (Signed)
ICU Daily Progress Note        Date Time: 12/21/16 12:10 PM  Patient Name: [name],[name]  Attending Physician: Gean Quint, MD  Room: [phone]   Admit Date: 12/14/2016  LOS: 7 days       ICU problem list: Respiratory Failure, Pneumonia, Septic Shock, UTI, Afib, HTN    Assessment:   Acute on chronic Respiratory Failure  Pneumonia, pseudomonas and providencia  Septic Shock  UTI, pseudomonas  GIB, melana per stool  Atrial Fibrillation, CVR  Anemia  Thrombocytopenia, resolved  DVT right upper extremity  Hx Cspine injury requiring trach/peg      Plan:   Weaning today, ps/cpap  ID following fortaz/vanc  Off pressors  No futher blood, suspect hemorrhoid  Heparin drip, increase to moderate  Monitor h/h    Code Status: full code    Subjective:   Review of Systems -   Review of Systems   Unable to perform ROS: Medical condition         Medications:      Scheduled Meds: PRN Meds:        amiodarone 200 mg Oral Daily   atorvastatin 20 mg Oral QHS   budesonide 0.5 mg Nebulization BID   cefTAZidime 2 g Intravenous Q8H SCH   ciprofloxacin 1 drop Right Eye Q2H WA   glycopyrrolate 1 mg Oral Daily   metoclopramide 5 mg Oral TID AC   mirtazapine 15 mg Oral QHS   multivitamin 1 tablet Oral Daily   pantoprazole 40 mg Oral Q12H SCH   senna 17.6 mg Oral QHS   sodium chloride (PF) 3 mL Intravenous Q8H   vancomycin 1,750 mg Intravenous Q24H       Continuous Infusions:  . heparin infusion 25,000 units/500 mL 12.9 Units/kg/hr (12/21/16 0600)      sodium chloride  PRN   acetaminophen 650 mg Q6H PRN   albuterol-ipratropium 3 mL Q4H PRN   calcium GLUConate 1 g PRN   dextrose 15 g of glucose PRN   And     dextrose 12.5 g PRN   And     glucagon (rDNA) 1 mg PRN   fentaNYL (PF) 50 mcg Q1H PRN   glycopyrrolate 0.2 mg Q8H PRN   heparin (porcine) 3,000 Units PRN   LORazepam 0.5 mg Q8H PRN   magnesium sulfate 1 g PRN   ondansetron 4 mg Q8H PRN   potassium chloride 20 mEq PRN   sodium phosphate IVPB 15 mmol PRN   sodium phosphate IVPB 25  mmol PRN   sodium phosphate IVPB 35 mmol PRN         Physical Exam:   Capillary refill = , 2  Physical Exam   Constitutional: He appears well-developed and well-nourished.   obese   HENT:   Head: Normocephalic.   Eyes: Conjunctivae and lids are normal. Pupils are equal, round, and reactive to light.   Neck: Neck supple. No JVD present.   Cardiovascular: Normal rate and normal heart sounds.  An irregularly irregular rhythm present.   Pulses:       Radial pulses are 2+ on the right side, and 2+ on the left side.        Dorsalis pedis pulses are 2+ on the right side, and 2+ on the left side.   Afib, anasarca improved   Pulmonary/Chest:   Diminished but clear   Abdominal: Soft. Bowel sounds are normal. There is no tenderness.   Peg tube in place, tolerating tube feeds  Genitourinary: Right testis shows no swelling.   Genitourinary Comments: foley   Musculoskeletal: He exhibits edema.   Neurological: He is alert. GCS eye subscore is 4.   Patient is deaf, able to communicate with writing   Skin: Skin is warm and dry. Capillary refill takes 2 to 3 seconds.   Psychiatric:   smiling       Data:   Invasive ICU Hemodynamics:    IBW/kg (Calculated) : 73    Vent Settings:    IBW: Vent Settings  Vent Mode: PS/CPAP  FiO2: 40 %  Resp Rate (Set): 20  Vt (Set, mL): 500 mL  PIP Observed (cm H2O): 20 cm H2O  PEEP/EPAP: 5 cm H20  Pressure Support / IPAP: 15 cmH20  Mean Airway Pressure: 9 cmH20    Patient Lines/Drains/Airways Status    Active PICC Line / CVC Line / PIV Line / Drain / Airway / Intraosseous Line / Epidural Line / ART Line / Line / Wound / Pressure Ulcer / NG/OG Tube     Name:   Placement date:   Placement time:   Site:   Days:    PICC Double Lumen 12/17/16 Right Brachial  12/17/16    1330    Brachial    less than 1    Peripheral IV 12/14/16 Right Antecubital  12/14/16    1205    Antecubital    3    Gastrostomy/Enterostomy Gastrostomy RUQ          RUQ        Urethral Catheter 18 Fr.  12/17/16    1759        less than 1     Surgical Airway Shiley 6 mm Cuffed;Long  12/14/16    1855    6 mm    3    Wound 12/14/16 Pressure Injury Sacrum Red open area with drainage  12/14/16    1600    Sacrum    3    Wound 12/17/16 Skin Tear Arm Left;Lower skin tear  12/17/16    0300    Arm    1    Wound 12/17/16 Other (Comment) Leg Anterior  12/17/16    1539    Leg    less than 1                VITAL SIGNS   Temp:  [97.9 F (36.6 C)-99 F (37.2 C)] 98.6 F (37 C)  Heart Rate:  [84-123] 86  Resp Rate:  [20-29] 20  BP: (96-138)/(51-75) 98/60  FiO2:  [35 %-40 %] 40 %  Blood Glucose:  Pulse ox:  Telemetry:afib cvr      Intake/Output Summary (Last 24 hours) at 12/21/16 1210  Last data filed at 12/21/16 0900   Gross per 24 hour   Intake           910.31 ml   Output             2415 ml   Net         -1504.69 ml        Labs:     CBC w/Diff CMP     Recent Labs  Lab 12/21/16  0350 12/20/16  0140 12/19/16  0307 12/18/16  0313 12/17/16  0149  12/16/16  0223   WBC 11.48* 12.09* 11.54* 9.97 10.50  --  9.18   Hgb 8.0* 8.1* 8.1* 8.4* 9.1* More results in Results Review 8.5*   Hematocrit 25.6* 25.2* 25.3* 26.4* 27.9* More results  in Results Review 26.3*   Platelets 286 182 158 116* 98*  --  80*   MCV 85.9 84.3 85.2 85.2 84.0  --  84.3   Neutrophils  --   --   --  77.6 80.3  --  71.2   More results in Results Review = values in this interval not displayed.       PT/INR     Recent Labs  Lab 12/19/16  1547 12/17/16  0149 12/16/16  0223   PT INR 1.2* 1.2* 1.2*         Recent Labs  Lab 12/21/16  1017 12/21/16  0350 12/20/16  1327  12/18/16  1342 12/18/16  0313 12/17/16  1212 12/17/16  0149   Sodium 140 141 139 More results in Results Review  --  134*  --  134*   Potassium 4.5 3.2* 3.6 More results in Results Review 3.9 3.9 3.9 3.9   Chloride 108 107 106 More results in Results Review  --  104  --  105   CO2 25 28 26  More results in Results Review  --  25  --  23   BUN 19.0 19.0 22.0 More results in Results Review  --  15.0  --  13.0   Creatinine 0.5* 0.6* 0.6* More results  in Results Review  --  0.7  --  0.6*   Glucose 106* 88 133* More results in Results Review  --  88  --  110*   Calcium 8.2 7.8* 7.9 More results in Results Review  --  7.7*  --  7.4*   Magnesium  --   --   --   --  1.9 1.7 2.1 1.7   Phosphorus  --   --   --   --   --  3.5 2.5 2.5   More results in Results Review = values in this interval not displayed.    Recent Labs  Lab 12/14/16  1552   Troponin I <0.01       @lababg @   Glucose POCT     Recent Labs  Lab 12/21/16  1017 12/21/16  0350 12/20/16  1327 12/20/16  0140 12/19/16  0307 12/18/16  0313 12/17/16  0149   Glucose 106* 88 133* 112* 106* 88 110*        Rads:   Radiological Imaging personally reviewed.    I have personally reviewed the patient's history and 24 hour interval events, along with vitals, labs, radiology images.     So far today I have spent 30 critical care minutes providing care for this patient excluding teaching and billable procedures, and not overlapping with any other providers.    Signed by:       Gean Quint, MD    Date/Time: 12/21/16 12:10 PM

## 2016-12-21 NOTE — UM Notes (Signed)
Primary Payor: MEDICARE/MEDICARE PART A AND B   Concurrent 12/21/16    Patient Name: [name],[name]    ICU problem list: Respiratory Failure, Pneumonia, Septic Shock, UTI, Afib, HTN    BP 102/67   Pulse 99   Temp 98.6 F (37 C) (Axillary)   Resp 20   Ht 1.778 m (5\' 10" )   Wt 113.1 kg (249 lb 5.4 oz)   SpO2 100%   BMI 35.78 kg/m     Temp:  [97.9 F (36.6 C)-98.6 F (37 C)]   Heart Rate:  [84-123]   Resp Rate:  [13-30]   BP: (96-135)/(56-72)   SpO2:  [97 %-100 %]   Weight:  [113.1 kg (249 lb 5.4 oz)]      IBW: Vent Settings  Vent Mode: PS/CPAP  FiO2: 40 %  Resp Rate (Set): 20  Vt (Set, mL): 500 mL  PIP Observed (cm H2O): 20 cm H2O  PEEP/EPAP: 5 cm H20  Pressure Support / IPAP: 15 cmH20  Mean Airway Pressure: 9 cmH20      Lab Results last 48 Hours     Procedure Component Value Units Date/Time    APTT [[id number]]  (Abnormal) Collected:  12/21/16 1728     Updated:  12/21/16 1801     PTT 56 (H) sec      APTT Anticoag. Given w/i 48 hrs. heparin    CBC [[id number]]  (Abnormal) Collected:  12/21/16 1728    Specimen:  Blood from Blood Updated:  12/21/16 1753     WBC 13.82 (H) x10 3/uL      Hgb 8.8 (L) g/dL      Hematocrit 46.9 (L) %      RBC 3.22 (L) x10 6/uL      MCH 27.3 (L) pg      MCHC 31.4 (L) g/dL      RDW 16 (H) %     Glucose Whole Blood - POCT [[id number]]  (Abnormal) Collected:  12/21/16 1553     Updated:  12/21/16 1624     POCT - Glucose Whole blood 111 (H) mg/dL     Glucose Whole Blood - POCT [[id number]]  (Abnormal) Collected:  12/21/16 1206     Updated:  12/21/16 1209     POCT - Glucose Whole blood 114 (H) mg/dL     Basic Metabolic Panel [[id number]]  (Abnormal) Collected:  12/21/16 1017    Specimen:  Blood Updated:  12/21/16 1044     Glucose 106 (H) mg/dL      Creatinine 0.5 (L) mg/dL     Basic Metabolic Panel [[id number]]  (Abnormal) Collected:  12/21/16 0350     Creatinine 0.6 (L) mg/dL      Calcium 7.8 (L) mg/dL      Potassium 3.2 (L) mEq/L     CBC without differential [[id number]]  (Abnormal)  Collected:  12/21/16 0350    Specimen:  Blood from Blood Updated:  12/21/16 0443     WBC 11.48 (H) x10 3/uL      Hgb 8.0 (L) g/dL      Hematocrit 18.8 (L) %      RBC 2.98 (L) x10 6/uL      MCH 26.8 (L) pg      MCHC 31.3 (L) g/dL      RDW 16 (H) %     APTT [[id number]]  (Abnormal) Collected:  12/21/16 0350     Updated:  12/21/16 0429     PTT 72 (H) sec      APTT  Anticoag. Given w/i 48 hrs. heparin    Glucose Whole Blood - POCT [[id number]]  (Abnormal) Collected:  12/21/16 0358     Updated:  12/21/16 0402     POCT - Glucose Whole blood 114 (H) mg/dL     APTT [[id number]]  (Abnormal) Collected:  12/20/16 2109     Updated:  12/20/16 2128     PTT 44 (H) sec      APTT Anticoag. Given w/i 48 hrs. heparin    Glucose Whole Blood - POCT [[id number]]  (Abnormal) Collected:  12/20/16 2005     Updated:  12/20/16 2017     POCT - Glucose Whole blood 126 (H) mg/dL     Glucose Whole Blood - POCT [[id number]]  (Abnormal) Collected:  12/20/16 1613     Updated:  12/20/16 1640     POCT - Glucose Whole blood 122 (H) mg/dL     Blood Culture Aerobic/Anaerobic #2 [[id number]] Collected:  12/14/16 1318    Specimen:  Arm from Blood Updated:  12/20/16 1503    Narrative:       [name] [id number] called Micro Results of Pos Cx. Results read back by:U [id number], by  [id number] on 12/16/2016 at 19:43  ORDER#: [id number]                                    ORDERED BY: NITZBERG, MICHA  SOURCE: Blood arm                                    COLLECTED:  12/14/16 13:18  ANTIBIOTICS AT COLL.:                                RECEIVED :  12/14/16 17:04  J [id number] called Micro Results of Pos Cx. Results read back by:U [id number], by [id number] on 12/16/2016 at 19:43  Culture Blood Aerobic and Anaerobic        FINAL       12/20/16 15:03   +  12/16/16   Anaerobic Blood Culture Positive in 48 to 72 hours             Gram Stain Shows: Gram positive cocci in clusters  12/20/16   Aerobic Blood Culture No Growth  12/17/16   Growth of Staphylococcus epidermidis               Preliminary organism  identification suggests possible skin             contaminant. Full identification and susceptibility testing             not performed without request unless additional blood             cultures, collected within 48 hours of this culture, become             positive. Contact the laboratory for further information             if necessary.        Basic metabolic panel [[id number]]  (Abnormal) Collected:  12/20/16 1327    Specimen:  Blood Updated:  12/20/16 1428     Glucose 133 (H) mg/dL      Creatinine 0.6 (L) mg/dL     APTT [[id number]]  (Abnormal) Collected:  12/20/16 1327     Updated:  12/20/16 1412     PTT 39 (H) sec      APTT Anticoag. Given w/i 48 hrs. heparin    Glucose Whole Blood - POCT [[id number]]  (Abnormal) Collected:  12/20/16 1151     Updated:  12/20/16 1156     POCT - Glucose Whole blood 119 (H) mg/dL     Glucose Whole Blood - POCT [[id number]]  (Abnormal) Collected:  12/20/16 0749     Updated:  12/20/16 0807     POCT - Glucose Whole blood 106 (H) mg/dL     Glucose Whole Blood - POCT [[id number]]  (Abnormal) Collected:  12/20/16 0346     Updated:  12/20/16 0354     POCT - Glucose Whole blood 114 (H) mg/dL     Basic Metabolic Panel [[id number]]  (Abnormal) Collected:  12/20/16 0140    Specimen:  Blood Updated:  12/20/16 0211     Glucose 112 (H) mg/dL      Calcium 7.6 (L) mg/dL      Potassium 3.1 (L) mEq/L     APTT [[id number]]  (Abnormal) Collected:  12/20/16 0140     Updated:  12/20/16 0202     PTT 54 (H) sec      APTT Anticoag. Given w/i 48 hrs. heparin    CBC without differential [[id number]]  (Abnormal) Collected:  12/20/16 0140    Specimen:  Blood from Blood Updated:  12/20/16 0154     WBC 12.09 (H) x10 3/uL      Hgb 8.1 (L) g/dL      Hematocrit 53.6 (L) %      RBC 2.99 (L) x10 6/uL      MCH 27.1 (L) pg      RDW 16 (H) %     Glucose Whole Blood - POCT [[id number]]  (Abnormal) Collected:  12/20/16 0011     Updated:  12/20/16 0035     POCT - Glucose Whole blood 112 (H) mg/dL     Glucose Whole Blood - POCT  [[id number]]  (Abnormal) Collected:  12/19/16 2012     Updated:  12/19/16 2020     POCT - Glucose Whole blood 106 (H) mg/dL           MD note 4/33/29  Assessment:  Acute on chronic Respiratory Failure  Pneumonia, pseudomonas and providencia  Septic Shock  UTI, pseudomonas  GIB, melana per stool  Atrial Fibrillation, CVR  Anemia  Thrombocytopenia, resolved  DVT right upper extremity  Hx Cspine injury requiring trach/peg  Plan:  Weaning today, ps/cpap  ID following fortaz/vanc  Off pressors  No futher blood, suspect hemorrhoid  Heparin drip, increase to moderate  Monitor h/h    Scheduled Meds:  Current Facility-Administered Medications   Medication Dose Route Frequency   . amiodarone  200 mg Oral Daily   . atorvastatin  20 mg Oral QHS   . budesonide  0.5 mg Nebulization BID   . ciprofloxacin  1 drop Right Eye Q2H WA   . glycopyrrolate  1 mg Oral Daily   . metoclopramide  5 mg Oral TID AC   . mirtazapine  15 mg Oral QHS   . multivitamin  1 tablet Oral Daily   . [START ON 12/22/2016] pantoprazole  40 mg Oral Daily   . senna  17.6 mg Oral QHS   . sodium chloride (PF)  3 mL Intravenous Q8H     Continuous Infusions:  . heparin infusion 25,000 units/500 mL 20 Units/kg/hr (12/21/16 1830)  PRN Meds:.sodium chloride, acetaminophen, albuterol-ipratropium, calcium GLUConate, Nursing communication: Adult Hypoglycemia Treatment Algorithm **AND** dextrose **AND** dextrose **AND** glucagon (rDNA), fentaNYL (PF), glycopyrrolate, heparin (porcine), heparin (porcine), LORazepam, magnesium sulfate, ondansetron, potassium chloride, sodium phosphate IVPB, sodium phosphate IVPB, sodium phosphate IVPB    Eddie North,  BSN, RN  Utilization Review Case Manager  Case Management Department  [facility]  T [phone] Judie Petit [phone]   Please submit all clinical review request via fax to [phone]

## 2016-12-21 NOTE — Progress Notes (Signed)
Nutritional Support Services  Nutrition Follow-up    [name] 83 y.o. male   MRN: [id number]    Summary of Nutrition Recommendations:  1. If patient is hemodynamically stable, initiate Vital High Protein 1.0 at 20 ml/hour and advance by 10 ml Q 4 hours to goal infusion rate of 50 ml/hour               Provides 1200 kcals, 105 gm protein, 1003 ml free water in 1200 ml total volume     2. Provide 1 packet of ProSource three times daily               EN + Prosource provides 1380 kcals and 150 gm protein               Provides 100% of estimated kcal needs and 100% of estimated protein needs     3. Flush tube with minimum of 30-50 ml Q 4 hours for tube patency- additional fluids for hydration per MD    Recommendations discussed with RN.  -----------------------------------------------------------------------------------------------------------------                                                     ASSESSMENT DATA   Subjective Nutrition: Follow up from last RDN full assessment on 12/17/2016. Pt tolerating EN well; no sides of intolerance. EN at goal rate of 50 ml/hour- has received >80% of total goal volume x 2 days. RDN to maintain recommendations for EN.   Events of Current Admission: admitted with healthcare associated PNA, UTI, AMS, sepsis, acute on chronic respiratory failure, a-fib, HTN; palliative care consult has been placed. Rectal bleeding (no further bleeding, stools negative). Discussed during rounds, RN Gardiner Ramus reports pt continues to not tolerate CPAP trials well. Tentative plan to d/c to [facility] on Monday.   Medical Hx:  has a past medical history of C2 cervical fracture; Chronic pain; Conjunctivitis; Constipation; Cutaneous abscess of buttock; Dysphagia; Hypertension; Insomnia; Kidney disorder; and Respiratory arrest.   Diet Hx: Tube feeding prior to admission - Isosource 1.5 @ 55 cc/hour x 20 hours (provides 1650 kcals, 75 gm protein, 840 ml free water in 1100 ml total volume; protein powder 2  scoops/BID; arginiaid 1 packet BID. 175 cc Q 4 hours for water flushes  Social Hx: never smoker; nondrinker; from [place]     Current Diet Order      Diet NPO effective now  Enteral: Vital HP via G-tube @ 50 ml/hour + Prosource x 3  Intake: 1050 ml on Jan 11-12 (87%)   1150 ml on Jan 10-11 (96%)    750 ml on Jan 9-10 (63%)   750 ml on Jan 8-9 (63%)     ANTHROPOMETRIC  Anthropometrics  Height: 177.8 cm (5\' 10" )  Weight: 113.1 kg (249 lb 5.4 oz)  Weight Change: -1.82  IBW/kg (Calculated) Male: 75.48 kg  IBW/kg (Calculated) Male: 68.16 kg  BMI (calculated): 32.4 (obese)     Weight Summary: major weight discrepancies present (+9 kg) during admission; likely bed scale errors; RDN will continue to use on the lower end of 107 kg to avoid overfeeding  Weight Monitoring 12/14/2016 12/14/2016 12/16/2016 12/17/2016 12/19/2016 12/20/2016 12/21/2016   Height 177.8 cm 177.8 cm - - - - -   Height Method Estimated Estimated - - - - -   Weight 106.595 kg 102.3 kg  107.7 kg 109.2 kg 111.7 kg 115.2 kg 113.1 kg   Weight Method - Bed Scale Bed Scale Bed Scale Bed Scale Bed Scale Bed Scale   BMI (calculated) 33.8 kg/m2 32.4 kg/m2 - - - - -     Physical Assessment:   Head: obese appearing   Upper Body: obese appearing   Lower Body: obese appearing   Edema: none noted per doc flow   Skin: warm, dry, ulcer, bruising, tear BUE and left forearm per doc flow   GI function: soft, rounded, tenderness, active, last BM on 12/20/2016 per doc flow     ESTIMATED NEEDS  Estimated Energy Needs  Total Energy Estimated Needs: 1175-1500 kcals  Method for Estimating Needs: 11-14 kcal/kg using 107 kg (obese; ICU)    Estimated Protein Needs  Total Protein Estimated Needs: 150-188 gm  Method for Estimating Needs: 2.0-2.5 gm/kg using IBW 75 kg (obese; ICU)    Fluid Needs  Total Fluid Estimated Needs: 1 ml/kcal or per MD     Pertinent Medications: pacerone, lipitor, ciprofloxacin (eye drops), reglan, remeron, MVI, pantoprazole, senokot, potassium chloride 20 mEq x 3, no  propofol   IVF:    . heparin infusion 25,000 units/500 mL     Pertinent labs: Glucose: 91-114 x 24 hours, Cr: 0.5, GFR: >60.0  Learning Needs: patient not appropriate for education-trache and AMS                                                     NUTRITION DIAGNOSIS     Inadequate energy intake related to respiratory failure as evidenced by interruption in PEG tube feedings upon transfer from [name] to [facility]. (resolved)     Increased protein needs related to wound healing as evidenced by stage 3 coccyx pressure ulcer.  (new)                                                            INTERVENTION   Nutrition recommendation -   1. If patient is hemodynamically stable, initiate Vital High Protein 1.0 at 20 ml/hour and advance by 10 ml Q 4 hours to goal infusion rate of 50 ml/hour               Provides 1200 kcals, 105 gm protein, 1003 ml free water in 1200 ml total volume     2. Provide 1 packet of ProSource three times daily               EN + Prosource provides 1380 kcals and 150 gm protein               Provides 100% of estimated kcal needs and 100% of estimated protein needs     3. Flush tube with minimum of 30-50 ml Q 4 hours for tube patency- additional fluids for hydration per MD    Goal: EN initiation within 24 hours (met)   Goal: Tolerate >80% of total goal volume (new- met x 2 days)  MONITORING     EN tolerance, weight, GI fxn, labs                                                      EVALUATION   Nutrition Risk Level: Moderate (will follow up within 7 days and PRN)     Carmelia Roller, RDN, CNSC   Office [phone]   Spectralink [phone]

## 2016-12-21 NOTE — Progress Notes (Addendum)
Palliative Care Progress Note - [facility]    Date Time: 12/21/16 3:59 PM  Patient Name: [name],[name]  Attending Physician: Gean Quint, MD    Assessment/Plan:   83 y.o.malewith hx of C2 spine fracture, chronic respiratory failure, bed-bound.   --Acute on chronic respiratory failure remains on the vent and pressors.   --HCAP  --UTI  --Sepsis off pressors. Still on the  vent    Advance Care Plan and GOC  Advance Directive: no  Code status: Full Code   Designated agent: statutory.   -- No feedback from family re: time for family meeting at this time. Patient has a wife and children.     Agitation/restlessness: Likely delirium. Continues to improve.Calm. Hard of hearing. Able to follow commands and answer a direct questions today. Continue current care.  --Haldol prn  --Simulate day and night  --Re-orientate as possible  --walkie -talkie to aid communication.     Pain: controlled on fentanyl drip. Comfortable.     Excessive secretions: appears resolved. Continue current care.     Constipation -- improving.  On tube feeding.     Disposition :TBD   Seen and d/w RN- Maurine Minister. at bedside.   Will see prn. Call for any urgent questions or issues. Thank you.   Chief Complaint:   F/u agitation and restlessness    Interval Hx:   Patient awake and alert. Able to make an "OK" sign when asked " how are you?" denies any pain. Able to give a good handshake. Failed weaning trial. Remains on vent. No family at bedside.   Review of Systems   Gastrointestinal: Positive for diarrhea. Negative for abdominal pain, nausea and vomiting.     As above.        Social History:   Code status: Full Code     Allergies:     Allergies   Allergen Reactions   . Dilaudid [Hydromorphone Hcl]        Medications:   Medication list/MAR reviewed.    Review of Systems:   Negative other than described above.    Physical Exam:   VS:   Vitals:    12/21/16 1345   BP:    Pulse: (!) 104   Resp: 19   Temp:    SpO2: 97%     GENERAL:  Appears in no obvious distress.  HEART: RRR. Normal S1, S2. No murmur appreciated.  CHEST: Breath sounds are clear bilaterally.  ABDOMEN: Soft. Non-tender. Non-distended. BS+.  GU: Deferred.  RECTAL: Deferred.  MUSCULOSKELETAL: no joint tenderness, deformities or swelling  EXTREMITIES:No edema. No clubbing. No cyanosis.  SKIN: No rash or lesion.  NEURO: Alert. Follows a few simple commands. Gesticulates and tries to mouth answers to simple direct questions.      Labs/Diagnostics:   Reviewed.   .LAB  Wt Readings from Last 5 Encounters:   12/21/16 113.1 kg (249 lb 5.4 oz)     Ht Readings from Last 1 Encounters:   12/14/16 1.778 m (5\' 10" )     Xr Chest Ap Portable    Result Date: 12/21/2016   Findings consistent with volume overload, overall stable.  Georgana Curio, MD 12/21/2016 7:51 AM        Signed by: Walker Shadow, MD  [facility]  [phone]

## 2016-12-21 NOTE — Progress Notes (Signed)
INFECTIOUS DISEASES PROGRESS NOTE    Date Time: 12/21/16 3:21 PM  Patient Name: [name], [name]  Patient status.Inpatient  Hospital Day: 7    Assessment and Plan:     1.  Pneumonia; pseudimonas and providencia  2.  CO2 retention.  3.  Chronic respiratory failure.  4.  UTI/pseudomonas  5. DVT RUE    PLAN: 1.Transfer to floor  2.[place] [name]  Subjective:   Trach  cpap  Review of Systems:   Review of Systems - General ROS: negative    Antibiotics:   Day 8    Other medications reviewed in EPIC.  Central Access:       Physical Exam:   Afeb  Vitals:    12/21/16 1345   BP:    Pulse: (!) 104   Resp: 19   Temp:    SpO2: 97%     Lungs: clear with decreased air entry  Heart: a fib  edema      Labs:   Pro calcitonin less than 0.1  Microbiology Results     Procedure Component Value Units Date/Time    Blood Culture Aerobic/Anaerobic #1 [[id number]] Collected:  12/14/16 1207    Specimen:  Arm from Blood Updated:  12/16/16 1421    Narrative:       ORDER#: [id number]                                    ORDERED BY: NITZBERG, MICHA  SOURCE: Blood arm                                    COLLECTED:  12/14/16 12:07  ANTIBIOTICS AT COLL.:                                RECEIVED :  12/14/16 14:18  Culture Blood Aerobic and Anaerobic        PRELIM      12/16/16 14:21  12/15/16   No Growth after 1 day/s of incubation.  12/16/16   No Growth after 2 day/s of incubation.      Blood Culture Aerobic/Anaerobic #2 [[id number]] Collected:  12/14/16 1318    Specimen:  Arm from Blood Updated:  12/16/16 1944    Narrative:       [name] [id number] called Micro Results of Pos Cx. Results read back by:U [id number], by  [id number] on 12/16/2016 at 19:43  ORDER#: [id number]                                    ORDERED BY: NITZBERG, MICHA  SOURCE: Blood arm                                    COLLECTED:  12/14/16 13:18  ANTIBIOTICS AT COLL.:                                RECEIVED :  12/14/16 17:04  J [id number] called Micro Results of Pos Cx. Results read back by:U [id number], by [id number] on  12/16/2016 at 19:43  Culture Blood Aerobic and Anaerobic  PRELIM      12/16/16 19:44   +  12/15/16   No Growth after 1 day/s of incubation.  12/16/16   No Growth after 2 day/s of incubation.             Anaerobic Blood Culture Positive in 48 to 72 hours             Gram Stain Shows: Gram positive cocci in clusters             Identification to follow      MRSA Culture [[id number]] Collected:  12/14/16 1208    Specimen:  Body Fluid from Nasal/Throat ASC Admission Updated:  12/15/16 1110    Narrative:       ORDER#: [id number]                                    ORDERED BY: [name], MICHA  SOURCE: Nares and Throat                             COLLECTED:  12/14/16 12:08  ANTIBIOTICS AT COLL.:                                RECEIVED :  12/14/16 15:39  Culture MRSA Surveillance                  FINAL       12/15/16 11:10  12/15/16   Negative for Methicillin Resistant Staph aureus from Nares and             Negative for Methicillin Resistant Staph aureus from Throat      MRSA culture (If not done in Triage) [[id number]] Collected:  12/15/16 0428    Specimen:  Body Fluid from Nasal/Throat ASC Admission Updated:  12/16/16 0003    Narrative:       ORDER#: [id number]                                    ORDERED BY: NEIGH, EMILY  SOURCE: Nares and Throat                             COLLECTED:  12/15/16 04:28  ANTIBIOTICS AT COLL.:                                RECEIVED :  12/15/16 06:53  Culture MRSA Surveillance                  FINAL       12/16/16 00:03  12/16/16   Negative for Methicillin Resistant Staph aureus from Nares and             Negative for Methicillin Resistant Staph aureus from Throat      Rapid influenza A/B antigens [[id number]] Collected:  12/14/16 1207    Specimen:  Nasopharyngeal from Nasal Aspirate Updated:  12/14/16 1230    Narrative:       ORDER#: [id number]  ORDERED BY: NITZBERG, MICHA  SOURCE: Nasal Aspirate                               COLLECTED:  12/14/16  12:07  ANTIBIOTICS AT COLL.:                                RECEIVED :  12/14/16 12:14  Influenza Rapid Antigen A&B                FINAL       12/14/16 12:29  12/14/16   Negative for Influenza A and B             Reference Range: Negative      Sputum Culture [[id number]] Collected:  12/14/16 1206    Specimen:  Sputum from Tracheal Aspirate Updated:  12/16/16 1622    Narrative:       ORDER#: [id number]                                    ORDERED BY: [name], MICHA  SOURCE: Tracheal Aspirate trach                      COLLECTED:  12/14/16 12:06  ANTIBIOTICS AT COLL.:                                RECEIVED :  12/14/16 16:16  Stain, Gram (Respiratory)                  FINAL       12/14/16 22:22   +  12/14/16   Few Squamous epithelial cells             Few WBC's             Moderate Mixed Respiratory Flora  Culture and Gram Stain, Aerobic, RespiratorPRELIM      12/16/16 16:22   +  12/15/16   Light growth of mixed upper respiratory flora  12/16/16   Heavy growth of Providencia stuartii               Further workup to follow including susceptibility testing    12/16/16   Heavy growth of Pseudomonas aeruginosa               Susceptibility to follow        Urine culture [[id number]] Collected:  12/14/16 1315    Specimen:  Urine from Urine, Catheterized, In & Out Updated:  12/16/16 1025    Narrative:       ORDER#: [id number]                                    ORDERED BY: [name]  SOURCE: Urine, Catheterized, In & Out                COLLECTED:  12/14/16 13:15  ANTIBIOTICS AT COLL.:                                RECEIVED :  12/14/16 17:39  Culture Urine  FINAL       12/16/16 10:25   +  12/16/16   50,000 - 70,000 CFU/ML Pseudomonas aeruginosa      _____________________________________________________________________________                                  P.aeruginosa    ANTIBIOTICS                     MIC  INTRP       _____________________________________________________________________________  Amikacin                        <=8    S        Aztreonam                        8     S        Cefepime                         4     S        Ceftazidime                      4     S        Ciprofloxacin                  <=0.5   S        Gentamicin                      <=2    S        Levofloxacin                    <=1    S        Meropenem                     <=0.25   S        Piperacillin/Tazobactam         8/4    S        Tobramycin                      <=2    S        _____________________________________________________________________________            S=SUSCEPTIBLE     I=INTERMEDIATE     R=RESISTANT                            N/S=NON-SUSCEPTIBLE  _____________________________________________________________________________            CBC w/Diff CMP     Recent Labs  Lab 12/21/16  0350 12/20/16  0140 12/19/16  0307 12/18/16  0313 12/17/16  0149  12/16/16  0223   WBC 11.48* 12.09* 11.54* 9.97 10.50  --  9.18   Hgb 8.0* 8.1* 8.1* 8.4* 9.1* More results in Results Review 8.5*   Hematocrit 25.6* 25.2* 25.3* 26.4* 27.9* More results in Results Review 26.3*   Platelets 286 182 158 116* 98*  --  80*   MCV 85.9 84.3 85.2 85.2 84.0  --  84.3   Neutrophils  --   --   --  77.6 80.3  --  71.2   More results in Results Review = values in this interval not displayed.    PT/INR     Recent Labs  Lab 12/19/16  1547 12/17/16  0149 12/16/16  0223   PT INR 1.2* 1.2* 1.2*         Recent Labs  Lab 12/21/16  1017 12/21/16  0350 12/20/16  1327  12/18/16  1342 12/18/16  0313 12/17/16  1212 12/17/16  0149   Sodium 140 141 139 More results in Results Review  --  134*  --  134*   Potassium 4.5 3.2* 3.6 More results in Results Review 3.9 3.9 3.9 3.9   Chloride 108 107 106 More results in Results Review  --  104  --  105   CO2 25 28 26  More results in Results Review  --  25  --  23   BUN 19.0 19.0 22.0 More results in Results Review  --  15.0  --  13.0    Creatinine 0.5* 0.6* 0.6* More results in Results Review  --  0.7  --  0.6*   Glucose 106* 88 133* More results in Results Review  --  88  --  110*   Calcium 8.2 7.8* 7.9 More results in Results Review  --  7.7*  --  7.4*   Magnesium  --   --   --   --  1.9 1.7 2.1 1.7   Phosphorus  --   --   --   --   --  3.5 2.5 2.5   More results in Results Review = values in this interval not displayed.   Glucose POCT     Recent Labs  Lab 12/21/16  1017 12/21/16  0350 12/20/16  1327 12/20/16  0140 12/19/16  0307 12/18/16  0313 12/17/16  0149   Glucose 106* 88 133* 112* 106* 88 110*          Rads:     Radiology Results (24 Hour)     Procedure Component Value Units Date/Time    XR Chest AP Portable [[id number]] Collected:  12/21/16 0750    Order Status:  Completed Updated:  12/21/16 0755    Narrative:       HISTORY: SOB.Marland Kitchen    TECHNIQUE: frontal view of the chest obtained.    PRIORS: 12/20/2016.    FINDINGS:   The cardiac silhouette is enlarged. There are stable bilateral pleural  effusions. There is cephalization of the pulmonary vasculature  consistent with volume overload. Please correlate clinically. There is  no evidence of pneumothorax. Trachea is in the midline. The bony  structures unremarkable.  There is right-sided PICC line with tip in the SVC. There is ET tube in  place.    Impression:        Findings consistent with volume overload, overall stable.         Georgana Curio, MD   12/21/2016 7:51 AM            Signed by: Zachery Dauer

## 2016-12-21 NOTE — Progress Notes (Signed)
Palliative Care Progress Note - [facility]    Date Time: 12/21/16 11:01 AM  Patient Name: [name],[name]  Attending Physician: Gean Quint, MD    Assessment/Plan:   83 y.o.malewith hx of C2 spine fracture, chronic respiratory failure, bed-bound.   --Acute on chronic respiratory failure remains on the vent and pressors.   --HCAP  --UTI  --Sepsis off pressors. Still on the  vent    Advance Care Plan and GOC  Advance Directive: no  Code status: Full Code   Designated agent: statutory.   -- No feedback from family re: time for family meeting at this time. Patient has a wife and children.     Agitation/restlessness: Likely delirium. Much better. Calm. Hard of hearing.   --Haldol prn  --Simulate day and night  --Re-orientate as possible  --walkie -talkie to aid communication.     Pain: controlled on fentanyl drip    Excessive secretions: much improved. Continue current care.     Constipation -- now has diarrhea. On tube feeding. R/o infectious process.     Disposition :TBD   seen and d/w RN at bedside.   Chief Complaint:   F/u agitation and restlessness    Interval Hx:   Patient awake and alert. Waving in response to greeting. Not following commands easily. Tried to squeeze my hand upon command. Limitation of communication likely to due to [facility]. No walkie-talkie at bedside. diarrhea ++. No signs of pain. Remains on the vent. Unable to wean at this time    ROS  As above.        Social History:   Code status: Full Code     Allergies:     Allergies   Allergen Reactions   . Dilaudid [Hydromorphone Hcl]        Medications:   Medication list/MAR reviewed.    Review of Systems:   Negative other than described above.    Physical Exam:   VS:   Vitals:    12/21/16 0817   BP:    Pulse:    Resp:    Temp:    SpO2: 99%     GENERAL: Appears in no obvious distress.  HEART: RRR. Normal S1, S2. No murmur appreciated.  CHEST: Breath sounds are clear bilaterally.  ABDOMEN: Soft. Non-tender. Non-distended. BS+.  GU:  Deferred.  RECTAL: Deferred.  MUSCULOSKELETAL: no joint tenderness, deformities or swelling  EXTREMITIES:No edema. No clubbing. No cyanosis.  SKIN: No rash or lesion.  NEURO: Alert. HOH. Waves. Tries to squeeze hand to command.     Labs/Diagnostics:   Reviewed.   .LAB  Wt Readings from Last 5 Encounters:   12/21/16 113.1 kg (249 lb 5.4 oz)     Ht Readings from Last 1 Encounters:   12/14/16 1.778 m (5\' 10" )     Xr Chest Ap Portable    Result Date: 12/21/2016   Findings consistent with volume overload, overall stable.  Georgana Curio, MD 12/21/2016 7:51 AM        Signed by: Walker Shadow, MD  [facility]  [phone]

## 2016-12-21 NOTE — Plan of Care (Signed)
Problem: Safety  Goal: Patient will be free from injury during hospitalization  Outcome: Progressing  Patient remain free of injury throughout the shift.  Bed alarm on, floor mat in place, hourly rounding done.  Patient alert, follows some commands, restless and and anxios  beginning of the shift, ativan 0.5mg  iv given x 1.  Remain in atrial fibrillation rate of 80-140, BP stable, afebrile.  Remain vented on PRVC, suction every 2-3 hours of thick white secretions.  Heparin gtt at 12.9 units/kg/ hr, last aptt 72, next aptt check at 1500.  Potassium level this am is 3.2, will replace per standing order.

## 2016-12-21 NOTE — Plan of Care (Addendum)
Problem: Compromised Tissue integrity  Goal: Damaged tissue is healing and protected  Outcome: Progressing   12/21/16 1846   Goal/Interventions addressed this shift   Damaged tissue is healing and protected  Monitor/assess Braden scale every shift;Provide wound care per wound care algorithm;Reposition patient every 2 hours and as needed unless able to reposition self;Increase activity as tolerated/progressive mobility;Relieve pressure to bony prominences for patients at moderate and high risk;Keep intact skin clean and dry;Avoid shearing injuries;Use bath wipes, not soap and water, for daily bathing;Use incontinence wipes for cleaning urine, stool and caustic drainage. Foley care as needed;Monitor external devices/tubes for correct placement to prevent pressure, friction and shearing;Encourage use of lotion/moisturizer on skin;Monitor patient's hygiene practices;Utilize specialty bed;Consult/collaborate with wound care nurse;Consider placing an indwelling catheter if incontinence interferes with healing of stage 3 or 4 pressure injury       Problem: Non-Violent Restraints Interdisciplinary Plan  Goal: Will be injury free during the use of non-violent restraints  Outcome: Progressing      Pt now on heparin moderate intensity Aptt 56 per protocol 4500 units heparin bolus given and rate increased by 2 units/kg/hr. Current rate 20u/k/h. Next Aptt in 6 hrs @ 0030.t to be transferred to intermediate unit when bed available.

## 2016-12-21 NOTE — Progress Notes (Signed)
Placed patient on PSV, unfortunately after switching over patient did not initiate any respirations.  Will continue to try throughout the day.

## 2016-12-22 ENCOUNTER — Inpatient Hospital Stay: Payer: Medicare Other

## 2016-12-22 DIAGNOSIS — J9621 Acute and chronic respiratory failure with hypoxia: Secondary | ICD-10-CM | POA: Diagnosis present

## 2016-12-22 DIAGNOSIS — J9690 Respiratory failure, unspecified, unspecified whether with hypoxia or hypercapnia: Secondary | ICD-10-CM | POA: Diagnosis present

## 2016-12-22 DIAGNOSIS — J151 Pneumonia due to Pseudomonas: Secondary | ICD-10-CM | POA: Diagnosis present

## 2016-12-22 DIAGNOSIS — Z9889 Other specified postprocedural states: Secondary | ICD-10-CM

## 2016-12-22 DIAGNOSIS — G9340 Encephalopathy, unspecified: Secondary | ICD-10-CM | POA: Diagnosis present

## 2016-12-22 LAB — BASIC METABOLIC PANEL
Anion Gap: 4 — ABNORMAL LOW (ref 5.0–15.0)
BUN: 22 mg/dL (ref 9.0–28.0)
CO2: 30 mEq/L — ABNORMAL HIGH (ref 22–29)
Calcium: 8.1 mg/dL (ref 7.9–10.2)
Chloride: 106 mEq/L (ref 100–111)
Creatinine: 0.6 mg/dL — ABNORMAL LOW (ref 0.7–1.3)
Glucose: 101 mg/dL — ABNORMAL HIGH (ref 70–100)
Potassium: 3.8 mEq/L (ref 3.5–5.1)
Sodium: 140 mEq/L (ref 136–145)

## 2016-12-22 LAB — CBC
Absolute NRBC: 0 10*3/uL
Hematocrit: 27.2 % — ABNORMAL LOW (ref 42.0–52.0)
Hgb: 8.4 g/dL — ABNORMAL LOW (ref 13.0–17.0)
MCH: 26.6 pg — ABNORMAL LOW (ref 28.0–32.0)
MCHC: 30.9 g/dL — ABNORMAL LOW (ref 32.0–36.0)
MCV: 86.1 fL (ref 80.0–100.0)
MPV: 10.9 fL (ref 9.4–12.3)
Nucleated RBC: 0 /100 WBC (ref 0.0–1.0)
Platelets: 354 10*3/uL (ref 140–400)
RBC: 3.16 10*6/uL — ABNORMAL LOW (ref 4.70–6.00)
RDW: 16 % — ABNORMAL HIGH (ref 12–15)
WBC: 13.76 10*3/uL — ABNORMAL HIGH (ref 3.50–10.80)

## 2016-12-22 LAB — HEMOLYSIS INDEX: Hemolysis Index: 5 (ref 0–18)

## 2016-12-22 LAB — APTT
PTT: 151 s (ref 23–37)
PTT: 81 s — ABNORMAL HIGH (ref 23–37)
PTT: 58 s — ABNORMAL HIGH (ref 23–37)

## 2016-12-22 LAB — GFR: EGFR: >60

## 2016-12-22 LAB — GLUCOSE WHOLE BLOOD - POCT
Whole Blood Glucose POCT: 105 mg/dL — ABNORMAL HIGH (ref 70–100)
Whole Blood Glucose POCT: 111 mg/dL — ABNORMAL HIGH (ref 70–100)
Whole Blood Glucose POCT: 111 mg/dL — ABNORMAL HIGH (ref 70–100)
Whole Blood Glucose POCT: 114 mg/dL — ABNORMAL HIGH (ref 70–100)
Whole Blood Glucose POCT: 116 mg/dL — ABNORMAL HIGH (ref 70–100)
Whole Blood Glucose POCT: 119 mg/dL — ABNORMAL HIGH (ref 70–100)
Whole Blood Glucose POCT: 120 mg/dL — ABNORMAL HIGH (ref 70–100)

## 2016-12-22 NOTE — Plan of Care (Signed)
Problem: Pain  Goal: Pain at adequate level as identified by patient  Outcome: Progressing      Comments: Patient grimacing and mouthing words but hard to understand, prn fentanyl iv given with adequate pain relief.  Patient follow some commands, MAE's, restless at times, positive cough and gag reflex. Still in atrial fibrillation rate 80-140, SBP 90-140, afebrile.  Vented on PRVC, suction every 3-4 hours of thick yellow secretions.  Tolerated ft, bm x 1 large loose stool, auop.

## 2016-12-22 NOTE — Progress Notes (Signed)
ICU PROGRESS NOTE    Date Time: 12/22/16 11:36 AM  Patient Name: [name],[name]  Attending Physician: Gean Quint, MD    Hospital Problem List:   Patient Active Problem List   Diagnosis   . Acute febrile illness   . Acute on chronic respiratory failure with hypoxia and hypercapnia   . Pneumonia of both lungs due to Pseudomonas species   . Acute encephalopathy   . Respiratory failure   . History of tracheostomy       ICU Problem List: Chronic systolic heart failure, Acute and chronic respiratory failure, Bacterial pneumonia, NOS, Aspiration Pneumonitis, Pleural effusion and Hypoxemia, Encephalopathy, Urinary tract infection pseudomonas and Sepsis, Deep Venous Thrombosis (DVT) and indwelling Foley    Assessment:   83 y.o. male with AMS, fever, likely due to right sided pneumonia. Started broad spectrum antibiotics.  Initially appeared to be on respiratory distress, improved with suctioning. Placed on vent in ED. Growing pseudomonas from sputum and urine.    Plan:   MVS via existing tracheostomy. IV antibiotics as per ID. PEG feedings.    Subjective:   Sedated/Obtunded. Diffuse rhonchi with multifocal alveolar infiltrates and BL effusions on CXR.     Medications:      Scheduled Meds: PRN Meds:      amiodarone 200 mg Oral Daily   atorvastatin 20 mg Oral QHS   budesonide 0.5 mg Nebulization BID   ciprofloxacin 1 drop Right Eye Q2H WA   glycopyrrolate 1 mg Oral Daily   metoclopramide 5 mg Oral TID AC   mirtazapine 15 mg Oral QHS   multivitamin 1 tablet Oral Daily   pantoprazole 40 mg Oral Daily   senna 17.6 mg Oral QHS   sodium chloride (PF) 3 mL Intravenous Q8H       Continuous Infusions:  . heparin infusion 25,000 units/500 mL 17 Units/kg/hr (12/22/16 0700)      sodium chloride  PRN   acetaminophen 650 mg Q6H PRN   albuterol-ipratropium 3 mL Q4H PRN   calcium GLUConate 1 g PRN   dextrose 15 g of glucose PRN   And     dextrose 12.5 g PRN   And     glucagon (rDNA) 1 mg PRN   fentaNYL (PF) 50 mcg Q1H PRN    glycopyrrolate 0.2 mg Q8H PRN   heparin (porcine) 40 Units/kg PRN   heparin (porcine) 80 Units/kg PRN   LORazepam 0.5 mg Q8H PRN   magnesium sulfate 1 g PRN   ondansetron 4 mg Q8H PRN   potassium chloride 20 mEq PRN   sodium phosphate IVPB 15 mmol PRN   sodium phosphate IVPB 25 mmol PRN   sodium phosphate IVPB 35 mmol PRN               Physical Exam:     VITAL SIGNS   Temp:  [97.3 F (36.3 C)-99.8 F (37.7 C)] 99.8 F (37.7 C)  Heart Rate:  [85-118] 86  Resp Rate:  [13-30] 20  BP: (81-142)/(50-91) 94/58  FiO2:  [40 %] 40 %  Blood Glucose:  Pulse ox:  Telemetry:  Vent Settings  Vent Mode: PRVC  FiO2: 40 %  Resp Rate (Set): 20  Vt (Set, mL): 500 mL  PIP Observed (cm H2O): 24 cm H2O  PEEP/EPAP: 5 cm H20  Pressure Support / IPAP: 15 cmH20  Mean Airway Pressure: 11 cmH20    Intake/Output Summary (Last 24 hours) at 12/22/16 1136  Last data filed at 12/22/16 0700   Gross  per 24 hour   Intake             1030 ml   Output             1070 ml   Net              -40 ml        Motor: 4- withdraws bilateral  Verbal: 1 - none  Eye: 1- none  Glasgow Coma Scale: 6    Invasive ICU Hemodynamics:                   Vent Settings:    IBW: Vent Settings  Vent Mode: PRVC  FiO2: 40 %  Resp Rate (Set): 20  Vt (Set, mL): 500 mL  PIP Observed (cm H2O): 24 cm H2O  PEEP/EPAP: 5 cm H20  Pressure Support / IPAP: 15 cmH20  Mean Airway Pressure: 11 cmH20    Lines/Drains/Airways:    Patient Lines/Drains/Airways Status    Active PICC Line / CVC Line / PIV Line / Drain / Airway / Intraosseous Line / Epidural Line / ART Line / Line / Wound / Pressure Ulcer / NG/OG Tube     Name:   Placement date:   Placement time:   Site:   Days:    PICC Double Lumen 12/17/16 Right Brachial  12/17/16    1330    Brachial    4    Gastrostomy/Enterostomy Gastrostomy RUQ          RUQ        Urethral Catheter 18 Fr.  12/17/16    1759        4    Surgical Airway Shiley 6 mm Cuffed;Long  12/14/16    1855    6 mm    7    Wound 12/14/16 Pressure Injury Sacrum Red open area  with drainage  12/14/16    1600    Sacrum    7    Wound 12/17/16 Skin Tear Arm Left;Lower skin tear  12/17/16    0300    Arm    5    Wound 12/17/16 Other (Comment) Leg Anterior  12/17/16    1539    Leg    4                General appearance - overweight, in mild to moderate distress and chronically ill appearing  Mental status - confused, drowsy, inappropriate safety awareness  Eyes - pupils equal and reactive, extraocular eye movements intact  Ears - hearing grossly normal bilaterally  Nose - normal and patent, no erythema, discharge or polyps  Mouth - mucous membranes moist, pharynx normal without lesions  Neck - trach  Lymphatics - no palpable lymphadenopathy, no hepatosplenomegaly  Chest - rhonchi noted diffuse  Heart - normal rate and regular rhythm  Abdomen - PEG  GU Male - Foley  Back exam - limited range of motion  Neurological - cranial nerves II through XII intact, motor and sensory grossly normal bilaterally  Musculoskeletal - no joint tenderness, deformity or swelling  Extremities - pedal edema 1 +, Capillary refill < 2 seconds  Skin - normal coloration and turgor, no rashes, no suspicious skin lesions noted    Labs:     Results     Procedure Component Value Units Date/Time    APTT [[id number]]  (Abnormal) Collected:  12/22/16 1030     Updated:  12/22/16 1053     PTT 81 (H) sec  APTT Anticoag. Given w/i 48 hrs. heparin    Glucose Whole Blood - POCT [[id number]]  (Abnormal) Collected:  12/22/16 0755     Updated:  12/22/16 0802     POCT - Glucose Whole blood 119 (H) mg/dL     Glucose Whole Blood - POCT [[id number]]  (Abnormal) Collected:  12/22/16 0355     Updated:  12/22/16 0359     POCT - Glucose Whole blood 105 (H) mg/dL     APTT [[id number]]  (Abnormal) Collected:  12/22/16 0116     Updated:  12/22/16 0234     PTT 151 (HH) sec      APTT Anticoag. Given w/i 48 hrs. heparin    Basic Metabolic Panel [[id number]]  (Abnormal) Collected:  12/22/16 0116    Specimen:  Blood Updated:  12/22/16 0156     Glucose  101 (H) mg/dL      BUN 62.9 mg/dL      Creatinine 0.6 (L) mg/dL      Calcium 8.1 mg/dL      Sodium 528 mEq/L      Potassium 3.8 mEq/L      Chloride 106 mEq/L      CO2 30 (H) mEq/L      Anion Gap 4.0 (L)    Hemolysis index [[id number]] Collected:  12/22/16 0116     Updated:  12/22/16 0156     Hemolysis Index 5    GFR [[id number]] Collected:  12/22/16 0116     Updated:  12/22/16 0156     EGFR >60.0    CBC without differential [[id number]]  (Abnormal) Collected:  12/22/16 0116    Specimen:  Blood from Blood Updated:  12/22/16 0136     WBC 13.76 (H) x10 3/uL      Hgb 8.4 (L) g/dL      Hematocrit 63.8 (L) %      Platelets 354 x10 3/uL      RBC 3.16 (L) x10 6/uL      MCV 86.1 fL      MCH 26.6 (L) pg      MCHC 30.9 (L) g/dL      RDW 16 (H) %      MPV 10.9 fL      Nucleated RBC 0.0 /100 WBC      Absolute NRBC 0.00 x10 3/uL     Glucose Whole Blood - POCT [[id number]]  (Abnormal) Collected:  12/22/16 0004     Updated:  12/22/16 0007     POCT - Glucose Whole blood 111 (H) mg/dL     CULTURE BLOOD AEROBIC AND ANAEROBIC [[id number]] Collected:  12/20/16 1803    Specimen:  Blood, Intravenous Line Updated:  12/21/16 2221    Narrative:       ORDER#: [id number]                                    ORDERED BY: Venetia Constable, ERIC  SOURCE: Blood, Intravenous Line picc                 COLLECTED:  12/20/16 18:03  ANTIBIOTICS AT COLL.:                                RECEIVED :  12/20/16 21:58  Culture Blood Aerobic and Anaerobic        PRELIM      12/21/16 22:21  12/21/16   No Growth after 1 day/s of incubation.      Glucose Whole Blood - POCT [[id number]]  (Abnormal) Collected:  12/21/16 2001     Updated:  12/21/16 2004     POCT - Glucose Whole blood 106 (H) mg/dL     APTT [[id number]]  (Abnormal) Collected:  12/21/16 1728     Updated:  12/21/16 1801     PTT 56 (H) sec      APTT Anticoag. Given w/i 48 hrs. heparin    CBC [[id number]]  (Abnormal) Collected:  12/21/16 1728    Specimen:  Blood from Blood Updated:  12/21/16 1753     WBC 13.82 (H) x10 3/uL       Hgb 8.8 (L) g/dL      Hematocrit 50.0 (L) %      Platelets 338 x10 3/uL      RBC 3.22 (L) x10 6/uL      MCV 87.0 fL      MCH 27.3 (L) pg      MCHC 31.4 (L) g/dL      RDW 16 (H) %      MPV 11.1 fL      Nucleated RBC 0.0 /100 WBC      Absolute NRBC 0.00 x10 3/uL     Glucose Whole Blood - POCT [[id number]]  (Abnormal) Collected:  12/21/16 1553     Updated:  12/21/16 1624     POCT - Glucose Whole blood 111 (H) mg/dL     CBC [[id number]] Collected:  12/21/16 1559    Specimen:  Blood from Blood Updated:  12/21/16 1559    Narrative:       Q48H    APTT [[id number]] Collected:  12/21/16 1559     Updated:  12/21/16 1559    Glucose Whole Blood - POCT [[id number]]  (Abnormal) Collected:  12/21/16 1206     Updated:  12/21/16 1209     POCT - Glucose Whole blood 114 (H) mg/dL             Rads:     Radiology Results (24 Hour)     Procedure Component Value Units Date/Time    XR Chest AP Portable [[id number]] Collected:  12/22/16 1540    Order Status:  Completed Updated:  12/22/16 0957    Narrative:       History: pna    Technique: Single Portable View    Comparison: 12/21/2016    Findings:  There is patchy opacity at both lung bases characteristic of a  combination of atelectasis and moderate-sized effusions. There is mild  -moderate vascular and interstitial prominence, likely reflecting  pulmonary edema.  There is no pneumothorax.  There is mild cardiomegaly.     The mediastinum is within normal limits.    Acute ostomy in good position. PICC line tip in the SVC      Impression:        Improvement in pulmonary edema with continued  moderate bilateral pleural effusions and underlying lower lobe  consolidation likely atelectasis.    Laurena Slimmer, MD   12/22/2016 9:53 AM        Critical Care  Performed by: Forest Becker T  Authorized by: Forest Becker T   Total critical care time: 35 minutes  Critical care time was exclusive of separately billable procedures and treating other patients.  Critical care was necessary to  treat or prevent imminent or life-threatening deterioration of the following conditions: cardiac failure, circulatory failure, respiratory failure,  sepsis and CNS failure or compromise.  Critical care was time spent personally by me on the following activities: ventilator management, re-evaluation of patient's condition, ordering and performing treatments and interventions, examination of patient, development of treatment plan with patient or surrogate, discussions with consultants, evaluation of patient's response to treatment, ordering and review of laboratory studies and review of old charts.          Signed by: Norton Pastel Vici Novick  Date/Time: 12/22/16 11:36 AM

## 2016-12-22 NOTE — Plan of Care (Signed)
Problem: Inadequate Gas Exchange  Goal: Adequate oxygenation and improved ventilation  Outcome: Progressing   12/22/16 1519   Goal/Interventions addressed this shift   Adequate oxygenation and improved ventilation Assess lung sounds;Monitor SpO2 and treat as needed;Provide mechanical and oxygen support to facilitate gas exchange;Position for maximum ventilatory efficiency;Consult/collaborate with Respiratory Therapy

## 2016-12-23 ENCOUNTER — Inpatient Hospital Stay: Payer: Medicare Other

## 2016-12-23 LAB — CBC AND DIFFERENTIAL
Absolute NRBC: 0 10*3/uL
Basophils Absolute Automated: 0.05 10*3/uL (ref 0.00–0.20)
Basophils Automated: 0.5 %
Eosinophils Absolute Automated: 0.43 10*3/uL (ref 0.00–0.70)
Eosinophils Automated: 3.9 %
Hematocrit: 25.7 % — ABNORMAL LOW (ref 42.0–52.0)
Hgb: 8 g/dL — ABNORMAL LOW (ref 13.0–17.0)
Immature Granulocytes Absolute: 0.13 10*3/uL — ABNORMAL HIGH
Immature Granulocytes: 1.2 %
Lymphocytes Absolute Automated: 1.19 10*3/uL (ref 0.50–4.40)
Lymphocytes Automated: 10.9 %
MCH: 26.9 pg — ABNORMAL LOW (ref 28.0–32.0)
MCHC: 31.1 g/dL — ABNORMAL LOW (ref 32.0–36.0)
MCV: 86.5 fL (ref 80.0–100.0)
MPV: 10.4 fL (ref 9.4–12.3)
Monocytes Absolute Automated: 1.17 10*3/uL (ref 0.00–1.20)
Monocytes: 10.7 %
Neutrophils Absolute: 7.97 10*3/uL (ref 1.80–8.10)
Neutrophils: 72.8 %
Nucleated RBC: 0 /100 WBC (ref 0.0–1.0)
Platelets: 376 10*3/uL (ref 140–400)
RBC: 2.97 10*6/uL — ABNORMAL LOW (ref 4.70–6.00)
RDW: 17 % — ABNORMAL HIGH (ref 12–15)
WBC: 10.94 10*3/uL — ABNORMAL HIGH (ref 3.50–10.80)

## 2016-12-23 LAB — COMPREHENSIVE METABOLIC PANEL
ALT: 13 U/L (ref 0–55)
AST (SGOT): 9 U/L (ref 5–34)
Albumin/Globulin Ratio: 0.6 — ABNORMAL LOW (ref 0.9–2.2)
Albumin: 1.7 g/dL — ABNORMAL LOW (ref 3.5–5.0)
Alkaline Phosphatase: 51 U/L (ref 38–106)
Anion Gap: 6 (ref 5.0–15.0)
BUN: 20 mg/dL (ref 9.0–28.0)
Bilirubin, Total: 0.4 mg/dL (ref 0.2–1.2)
CO2: 29 mEq/L (ref 22–29)
Calcium: 8.1 mg/dL (ref 7.9–10.2)
Chloride: 104 mEq/L (ref 100–111)
Creatinine: 0.6 mg/dL — ABNORMAL LOW (ref 0.7–1.3)
Globulin: 2.7 g/dL (ref 2.0–3.6)
Glucose: 90 mg/dL (ref 70–100)
Potassium: 3.5 mEq/L (ref 3.5–5.1)
Protein, Total: 4.4 g/dL — ABNORMAL LOW (ref 6.0–8.3)
Sodium: 139 mEq/L (ref 136–145)

## 2016-12-23 LAB — APTT
PTT: 148 s (ref 23–37)
PTT: 80 s — ABNORMAL HIGH (ref 23–37)

## 2016-12-23 LAB — CBC
Absolute NRBC: 0 10*3/uL
Absolute NRBC: 0 10*3/uL
Hematocrit: 27.4 % — ABNORMAL LOW (ref 42.0–52.0)
Hematocrit: 27.5 % — ABNORMAL LOW (ref 42.0–52.0)
Hgb: 8.8 g/dL — ABNORMAL LOW (ref 13.0–17.0)
Hgb: 8.6 g/dL — ABNORMAL LOW (ref 13.0–17.0)
MCH: 27.8 pg — ABNORMAL LOW (ref 28.0–32.0)
MCH: 27.2 pg — ABNORMAL LOW (ref 28.0–32.0)
MCHC: 32.1 g/dL (ref 32.0–36.0)
MCHC: 31.3 g/dL — ABNORMAL LOW (ref 32.0–36.0)
MCV: 86.7 fL (ref 80.0–100.0)
MCV: 87 fL (ref 80.0–100.0)
MPV: 10.3 fL (ref 9.4–12.3)
MPV: 10.2 fL (ref 9.4–12.3)
Nucleated RBC: 0 /100 WBC (ref 0.0–1.0)
Nucleated RBC: 0 /100 WBC (ref 0.0–1.0)
Platelets: 390 10*3/uL (ref 140–400)
Platelets: 382 10*3/uL (ref 140–400)
RBC: 3.16 10*6/uL — ABNORMAL LOW (ref 4.70–6.00)
RBC: 3.16 10*6/uL — ABNORMAL LOW (ref 4.70–6.00)
RDW: 17 % — ABNORMAL HIGH (ref 12–15)
RDW: 17 % — ABNORMAL HIGH (ref 12–15)
WBC: 12.38 10*3/uL — ABNORMAL HIGH (ref 3.50–10.80)
WBC: 11.42 10*3/uL — ABNORMAL HIGH (ref 3.50–10.80)

## 2016-12-23 LAB — BLOOD GAS, ARTERIAL
Arterial Total CO2: 25.1 mEq/L (ref 24.0–30.0)
Base Excess, Arterial: 4.5 mEq/L — ABNORMAL HIGH (ref <=2.0)
FIO2: 40 %
HCO3, Arterial: 27 mEq/L (ref 23.0–29.0)
O2 Sat, Arterial: 99.6 % (ref 95.0–100.0)
PEEP: 5
Rate: 20 {beats}/min
Temperature: 37
Tidal vol.: 500
pCO2, Arterial: 32.2 mmHg — ABNORMAL LOW (ref 35.0–45.0)
pH, Arterial: 7.532 — ABNORMAL HIGH (ref 7.350–7.450)
pO2, Arterial: 111 mmHg — ABNORMAL HIGH (ref 80.0–90.0)

## 2016-12-23 LAB — RETICULOCYTES
Immature Retic Fract: 35.5 % — ABNORMAL HIGH (ref 2.3–13.4)
Reticulocyte Count Absolute: 0.1352 10*6/uL (ref 0.0235–0.1500)
Reticulocyte Count Automated: 4.3 % — ABNORMAL HIGH (ref 0.5–2.5)
Reticulocyte Hemoglobin: 30.7 pg (ref 28.2–36.6)

## 2016-12-23 LAB — GLUCOSE WHOLE BLOOD - POCT
Whole Blood Glucose POCT: 104 mg/dL — ABNORMAL HIGH (ref 70–100)
Whole Blood Glucose POCT: 110 mg/dL — ABNORMAL HIGH (ref 70–100)
Whole Blood Glucose POCT: 123 mg/dL — ABNORMAL HIGH (ref 70–100)
Whole Blood Glucose POCT: 99 mg/dL (ref 70–100)

## 2016-12-23 LAB — GFR: EGFR: >60

## 2016-12-23 LAB — PROCALCITONIN: Procalcitonin: <0.1 (ref 0.0–0.1)

## 2016-12-23 LAB — HEMOLYSIS INDEX: Hemolysis Index: 1 (ref 0–18)

## 2016-12-23 LAB — IHS D-DIMER: D-Dimer: 1.43 ug/mL FEU — ABNORMAL HIGH (ref 0.00–0.51)

## 2016-12-23 LAB — MAGNESIUM: Magnesium: 1.2 mg/dL — ABNORMAL LOW (ref 1.6–2.6)

## 2016-12-23 LAB — URIC ACID: Uric acid: 1.9 mg/dL — ABNORMAL LOW (ref 3.6–9.7)

## 2016-12-23 LAB — AMMONIA: Ammonia: 29 umol/L (ref 18–72)

## 2016-12-23 LAB — LACTIC ACID, PLASMA: Lactic Acid: 1.1 mmol/L (ref 0.2–2.0)

## 2016-12-23 LAB — PHOSPHORUS: Phosphorus: 3.5 mg/dL (ref 2.3–4.7)

## 2016-12-23 MED ORDER — AMIODARONE HCL IN DEXTROSE 150-4.21 MG/100ML-% IV SOLN
150.0000 mg | Freq: Once | INTRAVENOUS | Status: AC
Start: 2016-12-23 — End: 2016-12-23
  Administered 2016-12-23: 150 mg via INTRAVENOUS
  Filled 2016-12-23: qty 100

## 2016-12-23 MED ORDER — AMIODARONE HCL IN DEXTROSE 360-4.14 MG/200ML-% IV SOLN
0.5000 mg/min | INTRAVENOUS | Status: DC
Start: 2016-12-23 — End: 2016-12-25
  Administered 2016-12-23: 1 mg/min via INTRAVENOUS
  Administered 2016-12-24: 0.5 mg/min via INTRAVENOUS
  Administered 2016-12-24: 1 mg/min via INTRAVENOUS
  Administered 2016-12-24 – 2016-12-25 (×3): 0.5 mg/min via INTRAVENOUS
  Filled 2016-12-23 (×6): qty 200

## 2016-12-23 NOTE — Plan of Care (Signed)
Problem: Compromised Tissue integrity  Goal: Damaged tissue is healing and protected  Outcome: Progressing   12/23/16 0041   Goal/Interventions addressed this shift   Damaged tissue is healing and protected  Monitor/assess Braden scale every shift;Provide wound care per wound care algorithm;Reposition patient every 2 hours and as needed unless able to reposition self;Increase activity as tolerated/progressive mobility;Relieve pressure to bony prominences for patients at moderate and high risk;Keep intact skin clean and dry;Use bath wipes, not soap and water, for daily bathing;Use incontinence wipes for cleaning urine, stool and caustic drainage. Foley care as needed;Monitor external devices/tubes for correct placement to prevent pressure, friction and shearing   Pt has ST II on sacrum. New mepilex applied, therahoney in wound. Pt repositioned q2H.     Problem: Artificial Airway  Goal: Tracheostomy will be maintained  Outcome: Progressing   12/23/16 0041   Goal/Interventions addressed this shift   Tracheostomy will be maintained Suction secretions as needed;Keep head of bed at 30 degrees, unless contraindicated;Encourage/perform oral hygiene as appropriate;Perform deep oropharyngeal suctioning at least every 4 hours;Apply water-based moisturizer to lips;Support ventilator tubing to avoid pressure from drag of tubing;Utilize tracheostomy securing device;Tracheostomy care every shift and as needed;Keep additional tracheostomy tube of the same size and one size smaller at bedside   Pt having thick/tan secretions, requiring frequent suctioning. Coarse breath sounds, diminished at the bases. Oral care done. Trach care completed.

## 2016-12-23 NOTE — Progress Notes (Signed)
ICU PROGRESS NOTE    Date Time: 12/23/16 2:23 PM  Patient Name: [name],[name]  Attending Physician: Gean Quint, MD    Hospital Problem List:   Patient Active Problem List   Diagnosis   . Acute febrile illness   . Acute on chronic respiratory failure with hypoxia and hypercapnia   . Pneumonia of both lungs due to Pseudomonas species   . Acute encephalopathy   . Respiratory failure   . History of tracheostomy       ICU Problem List: Chronic systolic heart failure, Acute and chronic respiratory failure, Bacterial pneumonia, NOS, Aspiration Pneumonitis and Pleural effusion, Encephalopathy, Urinary tract infection GNR, Deep Venous Thrombosis (DVT) and indwelling Foley    Assessment:   83 y.o.malewith AMS, fever, likely due to right sided pneumonia. Started broad spectrum antibiotics. Initially appeared to be on respiratory distress, improved with suctioning. Placed on vent in ED. Growing pseudomonas from sputum and urine    Plan:   Continue MVS. May consider return for SNF Monday.    Subjective:   More alert and communicating with writing board. Family brought [employer] valve that is incompatible with this ventilator     Medications:      Scheduled Meds: PRN Meds:      amiodarone 200 mg Oral Daily   atorvastatin 20 mg Oral QHS   budesonide 0.5 mg Nebulization BID   glycopyrrolate 1 mg Oral Daily   metoclopramide 5 mg Oral TID AC   mirtazapine 15 mg Oral QHS   multivitamin 1 tablet Oral Daily   pantoprazole 40 mg Oral Daily   senna 17.6 mg Oral QHS   sodium chloride (PF) 3 mL Intravenous Q8H       Continuous Infusions:  . heparin infusion 25,000 units/500 mL 16 Units/kg/hr (12/23/16 0830)      sodium chloride  PRN   acetaminophen 650 mg Q6H PRN   albuterol-ipratropium 3 mL Q4H PRN   calcium GLUConate 1 g PRN   dextrose 15 g of glucose PRN   And     dextrose 12.5 g PRN   And     glucagon (rDNA) 1 mg PRN   fentaNYL (PF) 50 mcg Q1H PRN   glycopyrrolate 0.2 mg Q8H PRN   heparin (porcine) 40 Units/kg PRN   heparin  (porcine) 80 Units/kg PRN   LORazepam 0.5 mg Q8H PRN   magnesium sulfate 1 g PRN   ondansetron 4 mg Q8H PRN   potassium chloride 20 mEq PRN   sodium phosphate IVPB 15 mmol PRN   sodium phosphate IVPB 25 mmol PRN   sodium phosphate IVPB 35 mmol PRN               Physical Exam:     VITAL SIGNS   Temp:  [97.7 F (36.5 C)-98.3 F (36.8 C)] 97.7 F (36.5 C)  Heart Rate:  [81-137] 102  Resp Rate:  [20-23] 20  BP: (85-146)/(53-83) 89/54  FiO2:  [40 %] 40 %  Blood Glucose:  Pulse ox:  Telemetry:  Vent Settings  Vent Mode: PRVC  FiO2: 40 %  Resp Rate (Set): 20  Vt (Set, mL): 500 mL  PIP Observed (cm H2O): 29 cm H2O  PEEP/EPAP: 5 cm H20  Pressure Support / IPAP: 15 cmH20  Mean Airway Pressure: 12 cmH20    Intake/Output Summary (Last 24 hours) at 12/23/16 1423  Last data filed at 12/23/16 0800   Gross per 24 hour   Intake  490 ml   Output             1500 ml   Net            -1010 ml        Motor: 6- obeys commands bilateral  Verbal: 5 - alert/oriented  Eye: 4 - spontaneous  Glasgow Coma Scale: 15    Invasive ICU Hemodynamics:                   Vent Settings:    IBW: Vent Settings  Vent Mode: PRVC  FiO2: 40 %  Resp Rate (Set): 20  Vt (Set, mL): 500 mL  PIP Observed (cm H2O): 29 cm H2O  PEEP/EPAP: 5 cm H20  Pressure Support / IPAP: 15 cmH20  Mean Airway Pressure: 12 cmH20    Lines/Drains/Airways:    Patient Lines/Drains/Airways Status    Active PICC Line / CVC Line / PIV Line / Drain / Airway / Intraosseous Line / Epidural Line / ART Line / Line / Wound / Pressure Ulcer / NG/OG Tube     Name:   Placement date:   Placement time:   Site:   Days:    PICC Double Lumen 12/17/16 Right Brachial  12/17/16    1330    Brachial    6    Gastrostomy/Enterostomy Gastrostomy RUQ          RUQ        Urethral Catheter 18 Fr.  12/17/16    1759        5    Surgical Airway Shiley 6 mm Cuffed;Long  12/14/16    1855    6 mm    8    Wound 12/14/16 Pressure Injury Sacrum Red open area with drainage  12/14/16    1600    Sacrum    8     Wound 12/17/16 Skin Tear Arm Left;Lower skin tear  12/17/16    0300    Arm    6    Wound 12/17/16 Other (Comment) Leg Anterior  12/17/16    1539    Leg    5                General appearance - overweight and chronically ill appearing  Mental status - alert, oriented to person, place, and time  Eyes - pupils equal and reactive, extraocular eye movements intact  Ears - hearing grossly normal bilaterally  Nose - normal and patent, no erythema, discharge or polyps  Mouth - mucous membranes moist, pharynx normal without lesions  Neck - trach  Lymphatics - no palpable lymphadenopathy, no hepatosplenomegaly  Chest - rhonchi noted scattered  Heart - tachycardia  Abdomen - soft, nontender, nondistended, no masses or organomegaly  Back exam - limited range of motion  Neurological - cranial nerves II through XII intact, motor and sensory grossly normal bilaterally  Musculoskeletal - no joint tenderness, deformity or swelling  Extremities - peripheral pulses normal, no pedal edema, no clubbing or cyanosis, Capillary refill < 2 seconds  Skin - normal coloration and turgor, no rashes, no suspicious skin lesions noted    Labs:     Results     Procedure Component Value Units Date/Time    APTT (Q6-12H PRN) [[id number]]  (Abnormal) Collected:  12/23/16 1357     Updated:  12/23/16 1422     PTT 80 (H) sec      APTT Anticoag. Given w/i 48 hrs. heparin    Narrative:  Q48H  Every 6 hours after heparin initiation and/or rate change  then every 12 hours once stable per moderate intensity  protocol    CBC [[id number]]  (Abnormal) Collected:  12/23/16 1357    Specimen:  Blood from Blood Updated:  12/23/16 1413     WBC 12.38 (H) x10 3/uL      Hgb 8.8 (L) g/dL      Hematocrit 40.9 (L) %      Platelets 390 x10 3/uL      RBC 3.16 (L) x10 6/uL      MCV 86.7 fL      MCH 27.8 (L) pg      MCHC 32.1 g/dL      RDW 17 (H) %      MPV 10.3 fL      Nucleated RBC 0.0 /100 WBC      Absolute NRBC 0.00 x10 3/uL     Narrative:       Q48H  Every 6 hours after  heparin initiation and/or rate change  then every 12 hours once stable per moderate intensity  protocol    Glucose Whole Blood - POCT [[id number]]  (Abnormal) Collected:  12/23/16 1316     Updated:  12/23/16 1321     POCT - Glucose Whole blood 110 (H) mg/dL     Procalcitonin [[id number]] Collected:  12/23/16 0436     Updated:  12/23/16 1249     Procalcitonin <0.1    Glucose Whole Blood - POCT [[id number]] Collected:  12/23/16 0824     Updated:  12/23/16 0833     POCT - Glucose Whole blood 99 mg/dL     APTT (B2-84X PRN) [[id number]]  (Abnormal) Collected:  12/23/16 0436     Updated:  12/23/16 0629     PTT 148 (HH) sec      APTT Anticoag. Given w/i 48 hrs. heparin    Narrative:       Every 6 hours after heparin initiation and/or rate change  then every 12 hours once stable per moderate intensity  protocol    Blood gas, arterial [[id number]]  (Abnormal) Collected:  12/23/16 0557    Specimen:  Blood, Arterial Updated:  12/23/16 0557     pH, Arterial 7.532 (H)     pCO2, Arterial 32.2 (L) mmHg      pO2, Arterial 111.0 (H) mmHg      HCO3, Arterial 27.0 mEq/L      Arterial Total CO2 25.1 mEq/L      Base Excess, Arterial 4.5 (H) mEq/L      O2 Sat, Arterial 99.6 %      ABG CollectionSite Right Radl     Allen's Test No PT NOT ABLE     Temperature 37.0     FIO2 40 %      Status Oxygen     O2 Delivery Ventilator     Rate 20 BPM      Mode: prvc     PEEP 5     Tidal vol. 500    Lactic acid, plasma [[id number]] Collected:  12/23/16 0557     Updated:  12/23/16 0557     Lactic acid 1.1 mmol/L     Hemolysis index [[id number]] Collected:  12/23/16 0436     Updated:  12/23/16 0542     Hemolysis Index 1    Narrative:       Every 6 hours after heparin initiation and/or rate change  then every 12 hours once stable per moderate intensity  protocol    GFR [[id number]] Collected:  12/23/16 0436     Updated:  12/23/16 0542     EGFR >60.0    Narrative:       Every 6 hours after heparin initiation and/or rate change  then every 12 hours once stable per  moderate intensity  protocol    Comprehensive metabolic panel [[id number]]  (Abnormal) Collected:  12/23/16 0436    Specimen:  Blood Updated:  12/23/16 0542     Glucose 90 mg/dL      BUN 78.2 mg/dL      Creatinine 0.6 (L) mg/dL      Calcium 8.1 mg/dL      Sodium 956 mEq/L      Potassium 3.5 mEq/L      Chloride 104 mEq/L      CO2 29 mEq/L      Anion Gap 6.0     Protein, Total 4.4 (L) g/dL      Albumin 1.7 (L) g/dL      AST (SGOT) 9 U/L      ALT 13 U/L      Alkaline Phosphatase 51 U/L      Bilirubin, Total 0.4 mg/dL      Globulin 2.7 g/dL      Albumin/Globulin Ratio 0.6 (L)    Narrative:       Every 6 hours after heparin initiation and/or rate change  then every 12 hours once stable per moderate intensity  protocol    Uric acid [[id number]]  (Abnormal) Collected:  12/23/16 0436    Specimen:  Blood Updated:  12/23/16 0542     Uric acid 1.9 (L) mg/dL     Narrative:       Every 6 hours after heparin initiation and/or rate change  then every 12 hours once stable per moderate intensity  protocol    Phosphorus [[id number]] Collected:  12/23/16 0436    Specimen:  Blood Updated:  12/23/16 0542     Phosphorus 3.5 mg/dL     Narrative:       Every 6 hours after heparin initiation and/or rate change  then every 12 hours once stable per moderate intensity  protocol    Magnesium [[id number]]  (Abnormal) Collected:  12/23/16 0436    Specimen:  Blood Updated:  12/23/16 0542     Magnesium 1.2 (L) mg/dL     Narrative:       Every 6 hours after heparin initiation and/or rate change  then every 12 hours once stable per moderate intensity  protocol    CBC and differential [[id number]]  (Abnormal) Collected:  12/23/16 0436    Specimen:  Blood from Blood Updated:  12/23/16 0537     WBC 10.94 (H) x10 3/uL      Hgb 8.0 (L) g/dL      Hematocrit 03.4 (L) %      Platelets 376 x10 3/uL      RBC 2.97 (L) x10 6/uL      MCV 86.5 fL      MCH 26.9 (L) pg      MCHC 31.1 (L) g/dL      RDW 17 (H) %      MPV 10.4 fL      Nucleated RBC 0.0 /100 WBC       Absolute NRBC 0.00 x10 3/uL      Neutrophils 72.8 %      Lymphocytes Automated 10.9 %      Monocytes 10.7 %      Eosinophils  Automated 3.9 %      Basophils Automated 0.5 %      Immature Granulocyte 1.2 %      Neutrophils Absolute 7.97 x10 3/uL      Abs Lymph Automated 1.19 x10 3/uL      Abs Mono Automated 1.17 x10 3/uL      Abs Eos Automated 0.43 x10 3/uL      Absolute Baso Automated 0.05 x10 3/uL      Absolute Immature Granulocyte 0.13 (H) x10 3/uL     Narrative:       Every 6 hours after heparin initiation and/or rate change  then every 12 hours once stable per moderate intensity  protocol    D-Dimer [[id number]]  (Abnormal) Collected:  12/23/16 0436     Updated:  12/23/16 0535     D-Dimer 1.43 (H) ug/mL FEU     Narrative:       Every 6 hours after heparin initiation and/or rate change  then every 12 hours once stable per moderate intensity  protocol    Ammonia [[id number]] Collected:  12/23/16 0436    Specimen:  Blood Updated:  12/23/16 0503     Ammonia 29 umol/L     Narrative:       Every 6 hours after heparin initiation and/or rate change  then every 12 hours once stable per moderate intensity  protocol    Glucose Whole Blood - POCT [[id number]]  (Abnormal) Collected:  12/22/16 2355     Updated:  12/23/16 0006     POCT - Glucose Whole blood 116 (H) mg/dL     APTT [[id number]]  (Abnormal) Collected:  12/22/16 2312     Updated:  12/22/16 2328     PTT 58 (H) sec      APTT Anticoag. Given w/i 48 hrs. heparin    CULTURE BLOOD AEROBIC AND ANAEROBIC [[id number]] Collected:  12/20/16 1803    Specimen:  Blood, Intravenous Line Updated:  12/22/16 2221    Narrative:       ORDER#: [id number]                                    ORDERED BY: Venetia Constable, ERIC  SOURCE: Blood, Intravenous Line picc                 COLLECTED:  12/20/16 18:03  ANTIBIOTICS AT COLL.:                                RECEIVED :  12/20/16 21:58  Culture Blood Aerobic and Anaerobic        PRELIM      12/22/16 22:21  12/21/16   No Growth after 1 day/s of  incubation.  12/22/16   No Growth after 2 day/s of incubation.      Glucose Whole Blood - POCT [[id number]]  (Abnormal) Collected:  12/22/16 1953     Updated:  12/22/16 2008     POCT - Glucose Whole blood 111 (H) mg/dL     Glucose Whole Blood - POCT [[id number]]  (Abnormal) Collected:  12/22/16 1557     Updated:  12/22/16 1606     POCT - Glucose Whole blood 114 (H) mg/dL             Rads:     Radiology Results (24 Hour)     Procedure Component Value Units  Date/Time    XR Chest AP Portable [[id number]] Collected:  12/23/16 9562    Order Status:  Completed Updated:  12/23/16 0819    Narrative:       History: dyspnea    Technique: Single Portable View    Comparison: 12/21/2016    Findings:  There is patchy opacity at both lung bases characteristic of a  combination of atelectasis and moderate-sized effusions. There is mild  -moderate vascular and interstitial prominence, likely reflecting  pulmonary edema although not completely specific and occasionally seen  in the setting of multifocal pneumonitis or other inflammatory disease.  PICC line and tracheostomy stable in position.  There is no pneumothorax.  There is mild cardiomegaly.     Atherosclerotic calcifications are seen  in the aorta.           Impression:        Stable multifocal airspace disease, most likely  CHF/fluid overload although nonspecific, with continued moderate  bilateral pleural effusions and underlying lower lobe consolidation  likely atelectasis.      No change from XR CHEST AP PORTABLE with report dated 12/22/2016 9:53 AM.    Laurena Slimmer, MD   12/23/2016 8:15 AM        Critical Care  Performed by: Forest Becker T  Authorized by: Forest Becker T   Total critical care time: 30 minutes  Critical care time was exclusive of separately billable procedures and treating other patients.  Critical care was necessary to treat or prevent imminent or life-threatening deterioration of the following conditions: respiratory failure, sepsis and  CNS failure or compromise.  Critical care was time spent personally by me on the following activities: re-evaluation of patient's condition, ordering and review of radiographic studies, examination of patient, development of treatment plan with patient or surrogate, discussions with consultants, evaluation of patient's response to treatment and obtaining history from patient or surrogate.          Signed by: Norton Pastel Hafsa Lohn  Date/Time: 12/23/16 2:23 PM

## 2016-12-23 NOTE — Progress Notes (Signed)
afib RVR, on heparin drip h/h had been improving, will convert to amiodarone drip.

## 2016-12-24 ENCOUNTER — Inpatient Hospital Stay: Payer: Medicare Other

## 2016-12-24 LAB — CBC AND DIFFERENTIAL
Absolute NRBC: 0 10*3/uL
Basophils Absolute Automated: 0.06 10*3/uL (ref 0.00–0.20)
Basophils Automated: 0.4 %
Eosinophils Absolute Automated: 0.36 10*3/uL (ref 0.00–0.70)
Eosinophils Automated: 2.5 %
Hematocrit: 28.9 % — ABNORMAL LOW (ref 42.0–52.0)
Hgb: 9.2 g/dL — ABNORMAL LOW (ref 13.0–17.0)
Immature Granulocytes Absolute: 0.17 10*3/uL — ABNORMAL HIGH
Immature Granulocytes: 1.2 %
Lymphocytes Absolute Automated: 1.15 10*3/uL (ref 0.50–4.40)
Lymphocytes Automated: 8 %
MCH: 27.6 pg — ABNORMAL LOW (ref 28.0–32.0)
MCHC: 31.8 g/dL — ABNORMAL LOW (ref 32.0–36.0)
MCV: 86.8 fL (ref 80.0–100.0)
MPV: 10.1 fL (ref 9.4–12.3)
Monocytes Absolute Automated: 1.44 10*3/uL — ABNORMAL HIGH (ref 0.00–1.20)
Monocytes: 10.1 %
Neutrophils Absolute: 11.14 10*3/uL — ABNORMAL HIGH (ref 1.80–8.10)
Neutrophils: 77.8 %
Nucleated RBC: 0 /100 WBC (ref 0.0–1.0)
Platelets: 428 10*3/uL — ABNORMAL HIGH (ref 140–400)
RBC: 3.33 10*6/uL — ABNORMAL LOW (ref 4.70–6.00)
RDW: 17 % — ABNORMAL HIGH (ref 12–15)
WBC: 14.32 10*3/uL — ABNORMAL HIGH (ref 3.50–10.80)

## 2016-12-24 LAB — COMPREHENSIVE METABOLIC PANEL
ALT: 18 U/L (ref 0–55)
AST (SGOT): 11 U/L (ref 5–34)
Albumin/Globulin Ratio: 0.6 — ABNORMAL LOW (ref 0.9–2.2)
Albumin: 2 g/dL — ABNORMAL LOW (ref 3.5–5.0)
Alkaline Phosphatase: 57 U/L (ref 38–106)
Anion Gap: 8 (ref 5.0–15.0)
BUN: 15 mg/dL (ref 9.0–28.0)
Bilirubin, Total: 0.3 mg/dL (ref 0.2–1.2)
CO2: 29 mEq/L (ref 22–29)
Calcium: 8.4 mg/dL (ref 7.9–10.2)
Chloride: 103 mEq/L (ref 100–111)
Creatinine: 0.5 mg/dL — ABNORMAL LOW (ref 0.7–1.3)
Globulin: 3.1 g/dL (ref 2.0–3.6)
Glucose: 117 mg/dL — ABNORMAL HIGH (ref 70–100)
Potassium: 3.5 mEq/L (ref 3.5–5.1)
Protein, Total: 5.1 g/dL — ABNORMAL LOW (ref 6.0–8.3)
Sodium: 140 mEq/L (ref 136–145)

## 2016-12-24 LAB — APTT: PTT: 81 s — ABNORMAL HIGH (ref 23–37)

## 2016-12-24 LAB — URINALYSIS WITH MICROSCOPIC
Bilirubin, UA: NEGATIVE
Glucose, UA: NEGATIVE
Ketones UA: NEGATIVE
Nitrite, UA: NEGATIVE
Protein, UR: NEGATIVE
Specific Gravity UA: 1.016 (ref 1.001–1.035)
Urine pH: 5 (ref 5.0–8.0)
Urobilinogen, UA: NEGATIVE mg/dL

## 2016-12-24 LAB — HEMOLYSIS INDEX
Hemolysis Index: 9 (ref 0–18)
Hemolysis Index: 5 (ref 0–18)

## 2016-12-24 LAB — B-TYPE NATRIURETIC PEPTIDE: B-Natriuretic Peptide: 342 pg/mL — ABNORMAL HIGH (ref 0–100)

## 2016-12-24 LAB — IRON: Iron: 21 ug/dL — ABNORMAL LOW (ref 40–160)

## 2016-12-24 LAB — GFR: EGFR: >60

## 2016-12-24 LAB — GLUCOSE WHOLE BLOOD - POCT
Whole Blood Glucose POCT: 131 mg/dL — ABNORMAL HIGH (ref 70–100)
Whole Blood Glucose POCT: 113 mg/dL — ABNORMAL HIGH (ref 70–100)
Whole Blood Glucose POCT: 117 mg/dL — ABNORMAL HIGH (ref 70–100)

## 2016-12-24 MED ORDER — METOCLOPRAMIDE HCL 5 MG PO TABS
5.0000 mg | ORAL_TABLET | Freq: Three times a day (TID) | ORAL | Status: DC | PRN
Start: 2016-12-24 — End: 2016-12-27
  Administered 2016-12-25: 5 mg via ORAL
  Filled 2016-12-24: qty 1

## 2016-12-24 MED ORDER — RIVAROXABAN 20 MG PO TABS
20.0000 mg | ORAL_TABLET | Freq: Every day | ORAL | Status: DC
Start: 2016-12-24 — End: 2016-12-27
  Administered 2016-12-24 – 2016-12-26 (×3): 20 mg via ORAL
  Filled 2016-12-24 (×4): qty 1

## 2016-12-24 NOTE — Progress Notes (Signed)
ICU Daily Progress Note        Date Time: 12/24/16 3:13 PM  Patient Name: [name],[name]  Attending Physician: Gean Quint, MD  Room: [id number]   Admit Date: 12/14/2016  LOS: 10 days       ICU problem list: Acute respiratory failure, Pneumonia, Septic shock, UTI, Afib, HTN    Assessment:   . Acute on chronic respiratory failure   . Septic shock   . Pneumonia, pseudomonas and providencia   . UTI, pseudomonas   . GIB, melana per stool   . Atrial fibrillation  . Anemia   . Thrombocytopenia, resolved   . DVT RUE   . Hx c-spine injury requiring trach/peg    Plan:   . On CPAP. Pulmicort.   . Not on pressors. Maintaining map > 65. Off antibiotics.   . Repeat sputum culture per ID. Procalcitonin. Hold antibiotics per ID. Afebrile.  Monitor.   . PPI. Monitor H/H. No evidence of obvious bleeding.    . On amiodarone infusion. Start xarelto. Echo pending. HR 90s  . H/H stable     Code Status: full code    Subjective:     Review of Systems   Respiratory: Negative for shortness of breath.    Cardiovascular: Negative for chest pain and palpitations.   Gastrointestinal: Negative for abdominal pain, nausea and vomiting.   Neurological: Negative for dizziness and headaches.   All other systems reviewed and are negative.      Medications:      Scheduled Meds: PRN Meds:      atorvastatin 20 mg Oral QHS   budesonide 0.5 mg Nebulization BID   glycopyrrolate 1 mg Oral Daily   mirtazapine 15 mg Oral QHS   multivitamin 1 tablet Oral Daily   pantoprazole 40 mg Oral Daily   rivaroxaban 20 mg Oral Daily with dinner   sodium chloride (PF) 3 mL Intravenous Q8H       Continuous Infusions:  . amiodarone 0.501 mg/min (12/24/16 1500)      sodium chloride  PRN   acetaminophen 650 mg Q6H PRN   albuterol-ipratropium 3 mL Q4H PRN   calcium GLUConate 1 g PRN   dextrose 15 g of glucose PRN   And     dextrose 12.5 g PRN   And     glucagon (rDNA) 1 mg PRN   fentaNYL (PF) 50 mcg Q1H PRN   glycopyrrolate 0.2 mg Q8H PRN   LORazepam 0.5 mg Q8H PRN    magnesium sulfate 1 g PRN   metoclopramide 5 mg TID PRN   ondansetron 4 mg Q8H PRN   potassium chloride 20 mEq PRN   sodium phosphate IVPB 15 mmol PRN   sodium phosphate IVPB 25 mmol PRN   sodium phosphate IVPB 35 mmol PRN         Physical Exam:     Physical Exam   Constitutional: He appears well-developed and well-nourished. No distress.   HENT:   Head: Normocephalic and atraumatic.   Neck: Neck supple.   Cardiovascular: An irregularly irregular rhythm present.   Pulmonary/Chest:   Diminished bases bilaterally   Abdominal: Bowel sounds are normal.   PEG in place    Musculoskeletal: He exhibits edema.   Neurological: He is alert.   Able to answer yes/no questions.    Skin: Skin is warm.   Sacral wound     Data:   Invasive ICU Hemodynamics:    IBW/kg (Calculated) : 73    Vent Settings:  IBW: Vent Settings  Vent Mode: PS/CPAP  FiO2: 35 %  Resp Rate (Set): 20  Vt (Set, mL): 500 mL  PIP Observed (cm H2O): 15 cm H2O  PEEP/EPAP: 5 cm H20  Pressure Support / IPAP: 10 cmH20  Mean Airway Pressure: 8 cmH20    Patient Lines/Drains/Airways Status    Active PICC Line / CVC Line / PIV Line / Drain / Airway / Intraosseous Line / Epidural Line / ART Line / Line / Wound / Pressure Ulcer / NG/OG Tube     Name:   Placement date:   Placement time:   Site:   Days:    PICC Double Lumen 12/17/16 Right Brachial  12/17/16    1330    Brachial    7    Gastrostomy/Enterostomy Gastrostomy RUQ          RUQ        Urethral Catheter 18 Fr.  12/17/16    1759        6    Surgical Airway Shiley 6 mm Cuffed;Long  12/14/16    1855    6 mm    9    Wound 12/14/16 Pressure Injury Sacrum Red open area with drainage  12/14/16    1600    Sacrum    9    Wound 12/17/16 Skin Tear Arm Left;Lower skin tear  12/17/16    0300    Arm    7    Wound 12/17/16 Other (Comment) Leg Anterior  12/17/16    1539    Leg    6                VITAL SIGNS   Temp:  [97 F (36.1 C)-98.7 F (37.1 C)] 97.8 F (36.6 C)  Heart Rate:  [72-151] 115  Resp Rate:  [18-33] 30  BP:  (98-145)/(52-100) 137/64  FiO2:  [35 %-40 %] 35 %      Intake/Output Summary (Last 24 hours) at 12/24/16 1513  Last data filed at 12/24/16 1500   Gross per 24 hour   Intake          2873.38 ml   Output             2310 ml   Net           563.38 ml        Labs:     CBC w/Diff CMP     Recent Labs  Lab 12/24/16  0155 12/23/16  2322 12/23/16  1357 12/23/16  0436  12/18/16  0313   WBC 14.32* 11.42* 12.38* 10.94* More results in Results Review 9.97   Hgb 9.2* 8.6* 8.8* 8.0* More results in Results Review 8.4*   Hematocrit 28.9* 27.5* 27.4* 25.7* More results in Results Review 26.4*   Platelets 428* 382 390 376 More results in Results Review 116*   MCV 86.8 87.0 86.7 86.5 More results in Results Review 85.2   Neutrophils 77.8  --   --  72.8  --  77.6   More results in Results Review = values in this interval not displayed.       PT/INR     Recent Labs  Lab 12/19/16  1547   PT INR 1.2*         Recent Labs  Lab 12/24/16  0154 12/23/16  0436 12/22/16  0116  12/18/16  1342 12/18/16  0313   Sodium 140 139 140 More results in Results Review  --  134*  Potassium 3.5 3.5 3.8 More results in Results Review 3.9 3.9   Chloride 103 104 106 More results in Results Review  --  104   CO2 29 29 30* More results in Results Review  --  25   BUN 15.0 20.0 22.0 More results in Results Review  --  15.0   Creatinine 0.5* 0.6* 0.6* More results in Results Review  --  0.7   Glucose 117* 90 101* More results in Results Review  --  88   Calcium 8.4 8.1 8.1 More results in Results Review  --  7.7*   Magnesium  --  1.2*  --   --  1.9 1.7   Phosphorus  --  3.5  --   --   --  3.5   Protein, Total 5.1* 4.4*  --   --   --   --    Albumin 2.0* 1.7*  --   --   --   --    AST (SGOT) 11 9  --   --   --   --    ALT 18 13  --   --   --   --    Alkaline Phosphatase 57 51  --   --   --   --    Bilirubin, Total 0.3 0.4  --   --   --   --    More results in Results Review = values in this interval not displayed.        @lababg @   Glucose POCT     Recent Labs  Lab  12/24/16  0154 12/23/16  0436 12/22/16  0116 12/21/16  1017 12/21/16  0350 12/20/16  1327 12/20/16  0140   Glucose 117* 90 101* 106* 88 133* 112*        Rads:   Radiological Imaging personally reviewed.    I have personally reviewed the patient's history and 24 hour interval events, along with vitals, labs, radiology images.     So far today I have spent 35 critical care minutes providing care for this patient excluding teaching and billable procedures, and not overlapping with any other providers.    Signed by: [name], MD.  Date/Time: 12/24/16 3:13 PM

## 2016-12-24 NOTE — Progress Notes (Signed)
Chaplain Service      Notes:    Patient was visited by Spiritual Care Chaplain, [facility]. Patient declined spiritual care.

## 2016-12-24 NOTE — Plan of Care (Signed)
Problem: Infection  Goal: Free from infection  Outcome: Not Progressing   12/24/16 1921   OTHER   Free from infection  Utilize sepsis protocol;Assess immunization status;Assess for signs/symptoms of infection;Utilize isolation precautions per protocol/policy;Consult/collaborate with Infection Preventionist       Comments: Sent UA and sputum culture.  Afebrile  Remains on amio gtt will transition over to po with an increased dose.  Transitioned heparin to xalrelto     Pt was up in chair position for 4 hrs.

## 2016-12-24 NOTE — Progress Notes (Signed)
INFECTIOUS DISEASES PROGRESS NOTE    Date Time: 12/24/16 11:39 AM  Patient Name: [name],[name]  Patient status.Inpatient  Hospital Day: 10    Assessment and Plan:     1.  Pneumonia; pseudimonas and providencia  2.  CO2 retention.  3.  Chronic respiratory failure.  4.  UTI/pseudomonas  5. DVT RUE  6.  No fever but white count is increasing    PLAN: 1.repeat sputum culture  2.  Repeat pro calcitonin  3.  Complete blood count with manual differential  4.  Hold on antibiotics since he is afebrile  Subjective:   Trach  Back on vent  Fibrillating  Review of Systems:   Review of Systems - General ROS: negative    Antibiotics:       Other medications reviewed in EPIC.  Central Access:       Physical Exam:   BP 121/67   Pulse 72   Temp 98 F (36.7 C) (Axillary)   Resp 20   Ht 1.778 m (5\' 10" )   Wt 111.3 kg (245 lb 6 oz)   SpO2 100%   BMI 35.21 kg/m @  Vitals:    12/24/16 1053   BP: 121/67   Pulse:    Resp:    Temp:    SpO2:      Lungs: clear with decreased air entry  Heart: a fib  edema      Labs:   Pro calcitonin less than 0.1  Microbiology Results     Procedure Component Value Units Date/Time    Blood Culture Aerobic/Anaerobic #1 [[id number]] Collected:  12/14/16 1207    Specimen:  Arm from Blood Updated:  12/16/16 1421    Narrative:       ORDER#: [id number]                                    ORDERED BY: NITZBERG, MICHA  SOURCE: Blood arm                                    COLLECTED:  12/14/16 12:07  ANTIBIOTICS AT COLL.:                                RECEIVED :  12/14/16 14:18  Culture Blood Aerobic and Anaerobic        PRELIM      12/16/16 14:21  12/15/16   No Growth after 1 day/s of incubation.  12/16/16   No Growth after 2 day/s of incubation.      Blood Culture Aerobic/Anaerobic #2 [[id number]] Collected:  12/14/16 1318    Specimen:  Arm from Blood Updated:  12/16/16 1944    Narrative:       [name] [id number] called Micro Results of Pos Cx. Results read back by:U [id number], by  [id number] on 12/16/2016 at 19:43  ORDER#:  [id number]                                    ORDERED BY: NITZBERG, MICHA  SOURCE: Blood arm  COLLECTED:  12/14/16 13:18  ANTIBIOTICS AT COLL.:                                RECEIVED :  12/14/16 17:04  J [id number] called Micro Results of Pos Cx. Results read back by:U [id number], by [id number] on 12/16/2016 at 19:43  Culture Blood Aerobic and Anaerobic        PRELIM      12/16/16 19:44   +  12/15/16   No Growth after 1 day/s of incubation.  12/16/16   No Growth after 2 day/s of incubation.             Anaerobic Blood Culture Positive in 48 to 72 hours             Gram Stain Shows: Gram positive cocci in clusters             Identification to follow      MRSA Culture [[id number]] Collected:  12/14/16 1208    Specimen:  Body Fluid from Nasal/Throat ASC Admission Updated:  12/15/16 1110    Narrative:       ORDER#: [id number]                                    ORDERED BY: [name], MICHA  SOURCE: Nares and Throat                             COLLECTED:  12/14/16 12:08  ANTIBIOTICS AT COLL.:                                RECEIVED :  12/14/16 15:39  Culture MRSA Surveillance                  FINAL       12/15/16 11:10  12/15/16   Negative for Methicillin Resistant Staph aureus from Nares and             Negative for Methicillin Resistant Staph aureus from Throat      MRSA culture (If not done in Triage) [[id number]] Collected:  12/15/16 0428    Specimen:  Body Fluid from Nasal/Throat ASC Admission Updated:  12/16/16 0003    Narrative:       ORDER#: [id number]                                    ORDERED BY: NEIGH, EMILY  SOURCE: Nares and Throat                             COLLECTED:  12/15/16 04:28  ANTIBIOTICS AT COLL.:                                RECEIVED :  12/15/16 06:53  Culture MRSA Surveillance                  FINAL       12/16/16 00:03  12/16/16   Negative for Methicillin Resistant Staph aureus from Nares and             Negative  for Methicillin Resistant Staph aureus from Throat      Rapid  influenza A/B antigens [[id number]] Collected:  12/14/16 1207    Specimen:  Nasopharyngeal from Nasal Aspirate Updated:  12/14/16 1230    Narrative:       ORDER#: [id number]                                    ORDERED BY: NITZBERG, MICHA  SOURCE: Nasal Aspirate                               COLLECTED:  12/14/16 12:07  ANTIBIOTICS AT COLL.:                                RECEIVED :  12/14/16 12:14  Influenza Rapid Antigen A&B                FINAL       12/14/16 12:29  12/14/16   Negative for Influenza A and B             Reference Range: Negative      Sputum Culture [[id number]] Collected:  12/14/16 1206    Specimen:  Sputum from Tracheal Aspirate Updated:  12/16/16 1622    Narrative:       ORDER#: [id number]                                    ORDERED BY: [name], MICHA  SOURCE: Tracheal Aspirate trach                      COLLECTED:  12/14/16 12:06  ANTIBIOTICS AT COLL.:                                RECEIVED :  12/14/16 16:16  Stain, Gram (Respiratory)                  FINAL       12/14/16 22:22   +  12/14/16   Few Squamous epithelial cells             Few WBC's             Moderate Mixed Respiratory Flora  Culture and Gram Stain, Aerobic, RespiratorPRELIM      12/16/16 16:22   +  12/15/16   Light growth of mixed upper respiratory flora  12/16/16   Heavy growth of Providencia stuartii               Further workup to follow including susceptibility testing    12/16/16   Heavy growth of Pseudomonas aeruginosa               Susceptibility to follow        Urine culture [[id number]] Collected:  12/14/16 1315    Specimen:  Urine from Urine, Catheterized, In & Out Updated:  12/16/16 1025    Narrative:       ORDER#: [id number]                                    ORDERED BY:  NITZBERG, MICHA  SOURCE: Urine, Catheterized, In & Out                COLLECTED:  12/14/16 13:15  ANTIBIOTICS AT COLL.:                                RECEIVED :  12/14/16 17:39  Culture Urine                              FINAL       12/16/16 10:25    +  12/16/16   50,000 - 70,000 CFU/ML Pseudomonas aeruginosa      _____________________________________________________________________________                                  P.aeruginosa    ANTIBIOTICS                     MIC  INTRP      _____________________________________________________________________________  Amikacin                        <=8    S        Aztreonam                        8     S        Cefepime                         4     S        Ceftazidime                      4     S        Ciprofloxacin                  <=0.5   S        Gentamicin                      <=2    S        Levofloxacin                    <=1    S        Meropenem                     <=0.25   S        Piperacillin/Tazobactam         8/4    S        Tobramycin                      <=2    S        _____________________________________________________________________________            S=SUSCEPTIBLE     I=INTERMEDIATE     R=RESISTANT                            N/S=NON-SUSCEPTIBLE  _____________________________________________________________________________            CBC w/Diff CMP     Recent Labs  Lab  12/24/16  0155 12/23/16  2322 12/23/16  1357 12/23/16  0436  12/18/16  0313   WBC 14.32* 11.42* 12.38* 10.94* More results in Results Review 9.97   Hgb 9.2* 8.6* 8.8* 8.0* More results in Results Review 8.4*   Hematocrit 28.9* 27.5* 27.4* 25.7* More results in Results Review 26.4*   Platelets 428* 382 390 376 More results in Results Review 116*   MCV 86.8 87.0 86.7 86.5 More results in Results Review 85.2   Neutrophils 77.8  --   --  72.8  --  77.6   More results in Results Review = values in this interval not displayed.    PT/INR     Recent Labs  Lab 12/19/16  1547   PT INR 1.2*         Recent Labs  Lab 12/24/16  0154 12/23/16  0436 12/22/16  0116  12/18/16  1342 12/18/16  0313 12/17/16  1212   Sodium 140 139 140 More results in Results Review  --  134*  --    Potassium 3.5 3.5 3.8 More results in Results Review 3.9 3.9 3.9    Chloride 103 104 106 More results in Results Review  --  104  --    CO2 29 29 30* More results in Results Review  --  25  --    BUN 15.0 20.0 22.0 More results in Results Review  --  15.0  --    Creatinine 0.5* 0.6* 0.6* More results in Results Review  --  0.7  --    Glucose 117* 90 101* More results in Results Review  --  88  --    Calcium 8.4 8.1 8.1 More results in Results Review  --  7.7*  --    Magnesium  --  1.2*  --   --  1.9 1.7 2.1   Phosphorus  --  3.5  --   --   --  3.5 2.5   Protein, Total 5.1* 4.4*  --   --   --   --   --    Albumin 2.0* 1.7*  --   --   --   --   --    AST (SGOT) 11 9  --   --   --   --   --    ALT 18 13  --   --   --   --   --    Alkaline Phosphatase 57 51  --   --   --   --   --    Bilirubin, Total 0.3 0.4  --   --   --   --   --    More results in Results Review = values in this interval not displayed.   Glucose POCT     Recent Labs  Lab 12/24/16  0154 12/23/16  0436 12/22/16  0116 12/21/16  1017 12/21/16  0350 12/20/16  1327 12/20/16  0140   Glucose 117* 90 101* 106* 88 133* 112*          Rads:     Radiology Results (24 Hour)     Procedure Component Value Units Date/Time    XR Chest AP Portable [[id number]] Collected:  12/24/16 0840    Order Status:  Completed Updated:  12/24/16 0846    Narrative:       PORTABLE CHEST    CLINICAL STATEMENT: dyspnea    COMPARISON: The study is compared to priors performed on 12/23/2016,  12/22/2016  and 12/21/2016.    FINDINGS: The study is limited due to the patients body habitus and  portable technique. A tracheostomy tube is unchanged as well as a right  PICC line.    There is worsening moderate pulmonary vascular congestion. Small  layering bilateral pleural effusions.    Increased in volume. There is consolidation throughout the retrocardiac  region and lung bases. The cardiomediastinal silhouette is grossly  stable.          Impression:         1. Worsening pulmonary vascular congestion. Enlargement of bilateral  pleural effusions.  2.  Consolidation throughout the lung bases which may be secondary to  layering pleural fluid, subsegmental atelectatic changes and/or  infiltrates.        Fonnie Mu, MD   12/24/2016 8:41 AM            Signed by: Zachery Dauer

## 2016-12-24 NOTE — Treatment Plan (Signed)
12/24/2016/11:00    Admit Dx: Urinary tract infection , PNA and Sepsis    Readmission:  No    Pathway: PNA: Meropenem    Sedation:  No    Pain:  No            DVT Prophylaxis: Yes    GI Prophylaxis: Yes    Diet: TF-Vital and full liquid diet    Fluid Restriction:  No    BGL < 180: Yes        Glucose Monitoring: Yes     Daily Weights:  Yes    Intake and Output:  Yes    Sepsis Screen: negative    Antibiotic Day #: 0      Central Line Necessity Reviewed: Yes    Foley Catheter Necessity Reviewed:  Yes    Restraint Need Reviewed: Yes    Integumentary:   Stage 2 on sacral    Early Mobilization Category: 2    PT/OT: Yes     Fall Risk : Score not indicative of fall risk. Mental status change         Proxy/Decision Maker Identified: Yes    Palliative Care: Yes      [facility]RTC Notification Triggers: No                                    Notified: No    Plan: continue with amio gtt and transition to xarelto    Downgrade/ Level of care: icu    Education Initiated:Yes    Diagnosis specific care plan Initiated: Yes    Participants:     Physician: Carilyn Goodpasture                             Nursing: Yes                             Pharmacy: Yes                             Nutrition: No                             Case Management: Yes                             Respiratory: No                             Palliative Care: Yes                             PT/OT: No
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
## 2016-12-24 NOTE — Plan of Care (Signed)
Problem: Speech Language Pathology  Goal: By discharge, patient will demonstrate cognition, communication and safe swallowing at the patient's highest functional potential.  See SLP evaluation(s)/note for goals.  Outcome: Progressing  Goals:  Patient will adhere to swallow safety precautions 100% of the time during meals.  Pt will demonstrate adequate oral phase in order to safely manage pureed and nectar thickened  liquids for 3 meals /day without overt s/s aspiration and use of min cues for comp swallowing strategies.   Pt will use strategies to improve swallow function to tolerate the least restrictive diet without s/s aspiration or penetration provided with supervision.      Plan/Recommendations:   pt will strongly benefit from order for Flexible Endoscopic Evaluation of swallowing to objectively assess pharyngeal stage of swallow and determine safety with PO diet .  Diet Solids Recommendation: full liquid diet  Diet Liquids Recommendations: nectar thick consistency  Precautions/Compensations: Awake/alert, Upright 90 degrees for all oral intake, 45 degrees upright after meals, Alternate solids and liquids, Swallow multiple times per bite/sip, Small bites/sips, Eat/feed slowly  Recommendation Discussed With: : Patient, Nurse  Administration of Medications: crushed with apple sauce   Aspiration Precautions posted at bedside: d/w patient and RN

## 2016-12-24 NOTE — Plan of Care (Signed)
Problem: Safety  Goal: Patient will be free from injury during hospitalization  Outcome: Progressing   12/24/16 0506   Goal/Interventions addressed this shift   Patient will be free from injury during hospitalization  Assess patient's risk for falls and implement fall prevention plan of care per policy;Provide and maintain safe environment;Use appropriate transfer methods;Ensure appropriate safety devices are available at the bedside;Include patient/ family/ care giver in decisions related to safety;Hourly rounding;Assess for patients risk for elopement and implement Elopement Risk Plan per policy;Provide alternative method of communication if needed (communication boards, writing)       Problem: Pain  Goal: Pain at adequate level as identified by patient  Outcome: Progressing   12/20/16 0456   Goal/Interventions addressed this shift   Pain at adequate level as identified by patient Identify patient comfort function goal;Assess for risk of opioid induced respiratory depression, including snoring/sleep apnea. Alert healthcare team of risk factors identified.;Reassess pain within 30-60 minutes of any procedure/intervention, per Pain Assessment, Intervention, Reassessment (AIR) Cycle;Assess pain on admission, during daily assessment and/or before any "as needed" intervention(s);Evaluate if patient comfort function goal is met;Evaluate patient's satisfaction with pain management progress;Offer non-pharmacological pain management interventions       Problem: Inadequate Gas Exchange  Goal: Patent Airway maintained  Outcome: Progressing   12/24/16 0506   Goal/Interventions addressed this shift   Patent airway maintained  Position patient for maximum ventilatory efficiency;Provide adequate fluid intake to liquefy secretions;Suction secretions as needed;Reinforce use of ordered respiratory interventions (i.e. CPAP, BiPAP, Incentive Spirometer, Acapella, etc.);Reposition patient every 2 hours and as needed unless able to  self-reposition       Problem: Non-Violent Restraints Interdisciplinary Plan  Goal: Will be injury free during the use of non-violent restraints  Outcome: Progressing   12/21/16 1846   Goal/Interventions addressed this shift   Will be injury free during the use of non-violent restraints Attempt all alternatives before use of restraints;Provide and maintain safe environment;Initiate least restrictive type of restraint that is effective;Include patient/family/caregiver in decisions related to safety;Notify family of initiation of restraints;Ensure safety devices are properly applied and maintained;Document observed patient actions according to protocol;Nurse to accompany patient off unit when on restraints;Remove restraints before the indicated maximum length of time when meets criteria for discontinuation;Document significant changes in patient condition       Comments: Unable to assess pt orientation, pt on trach/vent, HOH, able to nod/gesture, following commands, bed alarm in place, pt requires close monitoring. Pt denies having any pain and/or n/v this shift. A-fib with RVR in the evening HR 80s-150s, SBPs 90s-140s; pt initiated on amiodarone drip currently running at 1 mg/min per NP orders, HR currently in the 70s-80s. Pt afebrile overnight. Pt on moderate intensity heparin, no rate changes overnight, PTT 81, next PTT for 1400. K level 3.5, per protocol pt received x2 IV KCl. Pt with a 6mm Shiley, changed with dressing, PRVC mode rate 20, TV 500, Peep 5, and FiO2 40%; inline and oral suctioning done per protocol and as needed. TF Vital High Protein running at goal rate 50 ml/hr with 40cc q4h H2O flush, no residual noted overnight. Foley in place with tea/amber colored urine, draining to gravity, >1L output. Hourly rounds done. Call bell, telephone, and personal items within reach. Will continue to monitor.

## 2016-12-25 ENCOUNTER — Inpatient Hospital Stay: Payer: Medicare Other

## 2016-12-25 LAB — BASIC METABOLIC PANEL
Anion Gap: 6 (ref 5.0–15.0)
BUN: 17 mg/dL (ref 9.0–28.0)
CO2: 30 mEq/L — ABNORMAL HIGH (ref 22–29)
Calcium: 8.6 mg/dL (ref 7.9–10.2)
Chloride: 103 mEq/L (ref 100–111)
Creatinine: 0.6 mg/dL — ABNORMAL LOW (ref 0.7–1.3)
Glucose: 100 mg/dL (ref 70–100)
Potassium: 3.7 mEq/L (ref 3.5–5.1)
Sodium: 139 mEq/L (ref 136–145)

## 2016-12-25 LAB — GFR: EGFR: >60

## 2016-12-25 LAB — CBC
Absolute NRBC: 0 10*3/uL
Hematocrit: 26.1 % — ABNORMAL LOW (ref 42.0–52.0)
Hgb: 8.2 g/dL — ABNORMAL LOW (ref 13.0–17.0)
MCH: 27.2 pg — ABNORMAL LOW (ref 28.0–32.0)
MCHC: 31.4 g/dL — ABNORMAL LOW (ref 32.0–36.0)
MCV: 86.7 fL (ref 80.0–100.0)
MPV: 10.1 fL (ref 9.4–12.3)
Nucleated RBC: 0 /100 WBC (ref 0.0–1.0)
Platelets: 396 10*3/uL (ref 140–400)
RBC: 3.01 10*6/uL — ABNORMAL LOW (ref 4.70–6.00)
RDW: 17 % — ABNORMAL HIGH (ref 12–15)
WBC: 12.42 10*3/uL — ABNORMAL HIGH (ref 3.50–10.80)

## 2016-12-25 LAB — GLUCOSE WHOLE BLOOD - POCT
Whole Blood Glucose POCT: 103 mg/dL — ABNORMAL HIGH (ref 70–100)
Whole Blood Glucose POCT: 110 mg/dL — ABNORMAL HIGH (ref 70–100)
Whole Blood Glucose POCT: 119 mg/dL — ABNORMAL HIGH (ref 70–100)
Whole Blood Glucose POCT: 99 mg/dL (ref 70–100)

## 2016-12-25 LAB — PROCALCITONIN: Procalcitonin: <0.1 (ref 0.0–0.1)

## 2016-12-25 LAB — PHOSPHORUS: Phosphorus: 3.4 mg/dL (ref 2.3–4.7)

## 2016-12-25 LAB — HEMOLYSIS INDEX: Hemolysis Index: 10 (ref 0–18)

## 2016-12-25 LAB — MAGNESIUM: Magnesium: 1.5 mg/dL — ABNORMAL LOW (ref 1.6–2.6)

## 2016-12-25 MED ORDER — AMIODARONE ORAL SUSPENSION 5 MG/ML
400.0000 mg | ORAL | Status: DC
Start: 2016-12-25 — End: 2016-12-25

## 2016-12-25 MED ORDER — AMIODARONE HCL 200 MG PO TABS
400.0000 mg | ORAL_TABLET | Freq: Every day | ORAL | Status: DC
Start: 2016-12-25 — End: 2016-12-27
  Administered 2016-12-25 – 2016-12-27 (×3): 400 mg via GASTROSTOMY
  Filled 2016-12-25 (×3): qty 2

## 2016-12-25 MED ORDER — ALPRAZOLAM 0.25 MG PO TABS
0.2500 mg | ORAL_TABLET | Freq: Three times a day (TID) | ORAL | Status: DC | PRN
Start: 2016-12-25 — End: 2016-12-27
  Administered 2016-12-25 – 2016-12-26 (×3): 0.25 mg via ORAL
  Filled 2016-12-25 (×3): qty 1

## 2016-12-25 NOTE — Progress Notes (Signed)
ICU Daily Progress Note        Date Time: 12/25/16 1:52 PM  Patient Name: [name],[name]  Attending Physician: Gean Quint, MD  Room: [id number]   Admit Date: 12/14/2016  LOS: 11 days       ICU problem list: Acute respiratory failure, Pneumonia, Septic shock, UTI, Afib, HTN     Assessment:   . Acute on chronic respiratory failure   . Septic shock   . Pneumonia, pseudomonas and providencia   . UTI, pseudomonas   . GIB, melana per stool   . Atrial fibrillation   . Anemia   . DVT RUE  . Hx c-spine injury requiring trach/peg   . Thrombocytopenia, resolved     Plan:   . Unable to tolerate CPAP today 2/2 tachypnea, tachycardia, anxiety. CPAP trial in AM   . Not on pressor support. Remains with MAP > 65.   . Monitor off antibiotics. Leukocytosis improving. Sputum culture. Procalcitonin. ID following.   . Continue protonix. Monitor for obvious s/s of bleeding   . Off amio gtt. Start amio 400mg  PO. Continue Xarelto. Echo pending. HR 90s     Code Status: full code    Subjective:     Review of Systems   Unable to perform ROS: Other   Patient did not participate in ROS today, appears tired. Per RN, patient tired from CPAP trial.     Medications:      Scheduled Meds: PRN Meds:      amiodarone 400 mg per G tube Daily   atorvastatin 20 mg Oral QHS   budesonide 0.5 mg Nebulization BID   glycopyrrolate 1 mg Oral Daily   mirtazapine 15 mg Oral QHS   multivitamin 1 tablet Oral Daily   pantoprazole 40 mg Oral Daily   rivaroxaban 20 mg Oral Daily with dinner   sodium chloride (PF) 3 mL Intravenous Q8H       Continuous Infusions:     sodium chloride  PRN   acetaminophen 650 mg Q6H PRN   albuterol-ipratropium 3 mL Q4H PRN   calcium GLUConate 1 g PRN   dextrose 15 g of glucose PRN   And     dextrose 12.5 g PRN   And     glucagon (rDNA) 1 mg PRN   fentaNYL (PF) 50 mcg Q1H PRN   glycopyrrolate 0.2 mg Q8H PRN   LORazepam 0.5 mg Q8H PRN   magnesium sulfate 1 g PRN   metoclopramide 5 mg TID PRN   ondansetron 4 mg Q8H PRN    potassium chloride 20 mEq PRN   sodium phosphate IVPB 15 mmol PRN   sodium phosphate IVPB 25 mmol PRN   sodium phosphate IVPB 35 mmol PRN         Physical Exam:     Physical Exam   Constitutional: No distress.   Neck: Neck supple.   Cardiovascular: Intact distal pulses.  An irregularly irregular rhythm present.   Pulmonary/Chest:   Diminished at bases   Abdominal: Bowel sounds are normal.   Musculoskeletal: He exhibits edema.   Neurological:   Patient appears fatigued today. Opens eyes to voice; however, falls back asleep. HOH. Did not follow commands today. Per RN, patient is fatigued from earlier CPAP trial. Will monitor.    Skin: Skin is warm.       Data:   Invasive ICU Hemodynamics:    IBW/kg (Calculated) : 73    Vent Settings:    IBW: Vent Settings  Vent Mode: PRVC  FiO2: 35 %  Resp Rate (Set): 20  Vt (Set, mL): 500 mL  PIP Observed (cm H2O): 27 cm H2O  PEEP/EPAP: 5 cm H20  Pressure Support / IPAP: 10 cmH20  Mean Airway Pressure: 11 cmH20    Patient Lines/Drains/Airways Status    Active PICC Line / CVC Line / PIV Line / Drain / Airway / Intraosseous Line / Epidural Line / ART Line / Line / Wound / Pressure Ulcer / NG/OG Tube     Name:   Placement date:   Placement time:   Site:   Days:    PICC Double Lumen 12/17/16 Right Brachial  12/17/16    1330    Brachial    8    Gastrostomy/Enterostomy Gastrostomy RUQ          RUQ        Urethral Catheter 18 Fr.  12/17/16    1759        7    Surgical Airway Shiley 6 mm Cuffed;Long  12/14/16    1855    6 mm    10    Wound 12/14/16 Pressure Injury Sacrum Red open area with drainage  12/14/16    1600    Sacrum    10    Wound 12/17/16 Skin Tear Arm Left;Lower skin tear  12/17/16    0300    Arm    8    Wound 12/17/16 Other (Comment) Leg Anterior  12/17/16    1539    Leg    7                VITAL SIGNS   Temp:  [97.4 F (36.3 C)-98.6 F (37 C)] 98.2 F (36.8 C)  Heart Rate:  [80-117] 89  Resp Rate:  [16-38] 20  BP: (82-143)/(47-85) 102/56  FiO2:  [35 %] 35  %      Intake/Output Summary (Last 24 hours) at 12/25/16 1352  Last data filed at 12/25/16 1300   Gross per 24 hour   Intake             1424 ml   Output             2385 ml   Net             -961 ml        Labs:     CBC w/Diff CMP     Recent Labs  Lab 12/25/16  0138 12/24/16  0155 12/23/16  2322  12/23/16  0436   WBC 12.42* 14.32* 11.42* More results in Results Review 10.94*   Hgb 8.2* 9.2* 8.6* More results in Results Review 8.0*   Hematocrit 26.1* 28.9* 27.5* More results in Results Review 25.7*   Platelets 396 428* 382 More results in Results Review 376   MCV 86.7 86.8 87.0 More results in Results Review 86.5   Neutrophils  --  77.8  --   --  72.8   More results in Results Review = values in this interval not displayed.       PT/INR     Recent Labs  Lab 12/19/16  1547   PT INR 1.2*         Recent Labs  Lab 12/25/16  0138 12/24/16  0154 12/23/16  0436   Sodium 139 140 139   Potassium 3.7 3.5 3.5   Chloride 103 103 104   CO2 30* 29 29   BUN 17.0 15.0 20.0   Creatinine 0.6* 0.5* 0.6*  Glucose 100 117* 90   Calcium 8.6 8.4 8.1   Magnesium 1.5*  --  1.2*   Phosphorus 3.4  --  3.5   Protein, Total  --  5.1* 4.4*   Albumin  --  2.0* 1.7*   AST (SGOT)  --  11 9   ALT  --  18 13   Alkaline Phosphatase  --  57 51   Bilirubin, Total  --  0.3 0.4           @lababg @   Glucose POCT     Recent Labs  Lab 12/25/16  0138 12/24/16  0154 12/23/16  0436 12/22/16  0116 12/21/16  1017 12/21/16  0350 12/20/16  1327   Glucose 100 117* 90 101* 106* 88 133*        Rads:   Radiological Imaging personally reviewed.    I have personally reviewed the patient's history and 24 hour interval events, along with vitals, labs, radiology images.     So far today I have spent 35 critical care minutes providing care for this patient excluding teaching and billable procedures, and not overlapping with any other providers.    Signed by: [name], MD.  Date/Time: 12/25/16 1:52 PM

## 2016-12-25 NOTE — Plan of Care (Signed)
Problem: Safety  Goal: Patient will be free from injury during hospitalization   12/24/16 0506   Goal/Interventions addressed this shift   Patient will be free from injury during hospitalization  Assess patient's risk for falls and implement fall prevention plan of care per policy;Provide and maintain safe environment;Use appropriate transfer methods;Ensure appropriate safety devices are available at the bedside;Include patient/ family/ care giver in decisions related to safety;Hourly rounding;Assess for patients risk for elopement and implement Elopement Risk Plan per policy;Provide alternative method of communication if needed (communication boards, writing)       Problem: Psychosocial and Spiritual Needs  Goal: Demonstrates ability to cope with hospitalization/illness  Outcome: Progressing   12/25/16 0615   Goal/Interventions addressed this shift   Demonstrates ability to cope with hospitalizations/illness Provide quiet environment       Problem: Artificial Airway  Goal: Tracheostomy will be maintained  Outcome: Progressing   12/23/16 0041   Goal/Interventions addressed this shift   Tracheostomy will be maintained Suction secretions as needed;Keep head of bed at 30 degrees, unless contraindicated;Encourage/perform oral hygiene as appropriate;Perform deep oropharyngeal suctioning at least every 4 hours;Apply water-based moisturizer to lips;Support ventilator tubing to avoid pressure from drag of tubing;Utilize tracheostomy securing device;Tracheostomy care every shift and as needed;Keep additional tracheostomy tube of the same size and one size smaller at bedside       Problem: Non-Violent Restraints Interdisciplinary Plan  Goal: Will be injury free during the use of non-violent restraints  Outcome: Completed Date Met: 12/25/16      Comments: Unable to assess pt orientation, pt on trach/vent, HOH, able to nod/gesture, following commands, bed alarm in place, pt requires close monitoring. Pt denies having any pain  and/or n/v this shift. A-fib HR 80s-110s, SBPs 80s-130s; pt continues to be on amiodarone drip. Pt afebrile overnight. K level 3.7, per protocol pt received x1 IV KCl. Mg levels 1.5, x2 Mg sulfate given. Pt with a 6mm Shiley, changed with dressing, PRVC mode rate 20, TV 500, Peep 5, and FiO2 40%; inline and oral suctioning done per protocol and as needed. TF Vital High Protein running at goal rate 50 ml/hr with 40cc q4h H2O flush, no residual noted overnight. Foley in place with tea/amber colored urine, draining to gravity, >850cc output. Hourly rounds done. Call bell, telephone, and personal items within reach. Will continue to monitor.

## 2016-12-25 NOTE — Progress Notes (Signed)
INFECTIOUS DISEASES PROGRESS NOTE    Date Time: 12/25/16 9:59 AM  Patient Name: [name],[name]  Patient status.Inpatient  Hospital Day: 11    Assessment and Plan:     1.  Pneumonia; pseudimonas and providencia  -.  Sputum Gram stain is negative, culture results are still pending  2.  CO2 retention.  3.  Chronic respiratory failure.  4.  UTI/pseudomonas  5. DVT RUE  6.  No fever and white count is down with normal differential    PLAN: 1.check sputum culture  and pro calcitonin  3.    Hold on antibiotics since he is afebrile and WBC improving  Subjective:   Trach  Back on vent  Fibrillating  Review of Systems:   Review of Systems - General ROS: negative    Antibiotics:       Other medications reviewed in EPIC.  Central Access:       Physical Exam:   Afebrile  Vitals:    12/25/16 0900   BP: 107/57   Pulse: 96   Resp: 20   Temp:    SpO2: 98%     Lungs: clear with decreased air entry  Heart: a fib  edema      Labs:   Pro calcitonin pending  Microbiology Results     Procedure Component Value Units Date/Time    Blood Culture Aerobic/Anaerobic #1 [[id number]] Collected:  12/14/16 1207    Specimen:  Arm from Blood Updated:  12/16/16 1421    Narrative:       ORDER#: [id number]                                    ORDERED BY: NITZBERG, MICHA  SOURCE: Blood arm                                    COLLECTED:  12/14/16 12:07  ANTIBIOTICS AT COLL.:                                RECEIVED :  12/14/16 14:18  Culture Blood Aerobic and Anaerobic        PRELIM      12/16/16 14:21  12/15/16   No Growth after 1 day/s of incubation.  12/16/16   No Growth after 2 day/s of incubation.      Blood Culture Aerobic/Anaerobic #2 [[id number]] Collected:  12/14/16 1318    Specimen:  Arm from Blood Updated:  12/16/16 1944    Narrative:       [name] [id number] called Micro Results of Pos Cx. Results read back by:U [id number], by  [id number] on 12/16/2016 at 19:43  ORDER#: [id number]                                    ORDERED BY: NITZBERG, MICHA  SOURCE: Blood arm                                     COLLECTED:  12/14/16 13:18  ANTIBIOTICS AT COLL.:  RECEIVED :  12/14/16 17:04  J [id number] called Micro Results of Pos Cx. Results read back by:U [id number], by [id number] on 12/16/2016 at 19:43  Culture Blood Aerobic and Anaerobic        PRELIM      12/16/16 19:44   +  12/15/16   No Growth after 1 day/s of incubation.  12/16/16   No Growth after 2 day/s of incubation.             Anaerobic Blood Culture Positive in 48 to 72 hours             Gram Stain Shows: Gram positive cocci in clusters             Identification to follow      MRSA Culture [[id number]] Collected:  12/14/16 1208    Specimen:  Body Fluid from Nasal/Throat ASC Admission Updated:  12/15/16 1110    Narrative:       ORDER#: [id number]                                    ORDERED BY: [name], MICHA  SOURCE: Nares and Throat                             COLLECTED:  12/14/16 12:08  ANTIBIOTICS AT COLL.:                                RECEIVED :  12/14/16 15:39  Culture MRSA Surveillance                  FINAL       12/15/16 11:10  12/15/16   Negative for Methicillin Resistant Staph aureus from Nares and             Negative for Methicillin Resistant Staph aureus from Throat      MRSA culture (If not done in Triage) [[id number]] Collected:  12/15/16 0428    Specimen:  Body Fluid from Nasal/Throat ASC Admission Updated:  12/16/16 0003    Narrative:       ORDER#: [id number]                                    ORDERED BY: NEIGH, EMILY  SOURCE: Nares and Throat                             COLLECTED:  12/15/16 04:28  ANTIBIOTICS AT COLL.:                                RECEIVED :  12/15/16 06:53  Culture MRSA Surveillance                  FINAL       12/16/16 00:03  12/16/16   Negative for Methicillin Resistant Staph aureus from Nares and             Negative for Methicillin Resistant Staph aureus from Throat      Rapid influenza A/B antigens [[id number]] Collected:  12/14/16 1207    Specimen:  Nasopharyngeal from Nasal  Aspirate Updated:  12/14/16 1230    Narrative:  ORDER#: [id number]                                    ORDERED BY: NITZBERG, MICHA  SOURCE: Nasal Aspirate                               COLLECTED:  12/14/16 12:07  ANTIBIOTICS AT COLL.:                                RECEIVED :  12/14/16 12:14  Influenza Rapid Antigen A&B                FINAL       12/14/16 12:29  12/14/16   Negative for Influenza A and B             Reference Range: Negative      Sputum Culture [[id number]] Collected:  12/14/16 1206    Specimen:  Sputum from Tracheal Aspirate Updated:  12/16/16 1622    Narrative:       ORDER#: [id number]                                    ORDERED BY: [name], MICHA  SOURCE: Tracheal Aspirate trach                      COLLECTED:  12/14/16 12:06  ANTIBIOTICS AT COLL.:                                RECEIVED :  12/14/16 16:16  Stain, Gram (Respiratory)                  FINAL       12/14/16 22:22   +  12/14/16   Few Squamous epithelial cells             Few WBC's             Moderate Mixed Respiratory Flora  Culture and Gram Stain, Aerobic, RespiratorPRELIM      12/16/16 16:22   +  12/15/16   Light growth of mixed upper respiratory flora  12/16/16   Heavy growth of Providencia stuartii               Further workup to follow including susceptibility testing    12/16/16   Heavy growth of Pseudomonas aeruginosa               Susceptibility to follow        Urine culture [[id number]] Collected:  12/14/16 1315    Specimen:  Urine from Urine, Catheterized, In & Out Updated:  12/16/16 1025    Narrative:       ORDER#: [id number]                                    ORDERED BY: [name]  SOURCE: Urine, Catheterized, In & Out                COLLECTED:  12/14/16 13:15  ANTIBIOTICS AT COLL.:  RECEIVED :  12/14/16 17:39  Culture Urine                              FINAL       12/16/16 10:25   +  12/16/16   50,000 - 70,000 CFU/ML Pseudomonas  aeruginosa      _____________________________________________________________________________                                  P.aeruginosa    ANTIBIOTICS                     MIC  INTRP      _____________________________________________________________________________  Amikacin                        <=8    S        Aztreonam                        8     S        Cefepime                         4     S        Ceftazidime                      4     S        Ciprofloxacin                  <=0.5   S        Gentamicin                      <=2    S        Levofloxacin                    <=1    S        Meropenem                     <=0.25   S        Piperacillin/Tazobactam         8/4    S        Tobramycin                      <=2    S        _____________________________________________________________________________            S=SUSCEPTIBLE     I=INTERMEDIATE     R=RESISTANT                            N/S=NON-SUSCEPTIBLE  _____________________________________________________________________________            CBC w/Diff CMP     Recent Labs  Lab 12/25/16  0138 12/24/16  0155 12/23/16  2322  12/23/16  0436   WBC 12.42* 14.32* 11.42* More results in Results Review 10.94*   Hgb 8.2* 9.2* 8.6* More results in Results Review 8.0*   Hematocrit 26.1* 28.9* 27.5* More results in Results Review 25.7*   Platelets 396 428* 382 More results in Results Review 376  MCV 86.7 86.8 87.0 More results in Results Review 86.5   Neutrophils  --  77.8  --   --  72.8   More results in Results Review = values in this interval not displayed.    PT/INR     Recent Labs  Lab 12/19/16  1547   PT INR 1.2*         Recent Labs  Lab 12/25/16  0138 12/24/16  0154 12/23/16  0436  12/18/16  1342   Sodium 139 140 139 More results in Results Review  --    Potassium 3.7 3.5 3.5 More results in Results Review 3.9   Chloride 103 103 104 More results in Results Review  --    CO2 30* 29 29 More results in Results Review  --    BUN 17.0 15.0 20.0 More results  in Results Review  --    Creatinine 0.6* 0.5* 0.6* More results in Results Review  --    Glucose 100 117* 90 More results in Results Review  --    Calcium 8.6 8.4 8.1 More results in Results Review  --    Magnesium 1.5*  --  1.2*  --  1.9   Phosphorus 3.4  --  3.5  --   --    Protein, Total  --  5.1* 4.4*  --   --    Albumin  --  2.0* 1.7*  --   --    AST (SGOT)  --  11 9  --   --    ALT  --  18 13  --   --    Alkaline Phosphatase  --  57 51  --   --    Bilirubin, Total  --  0.3 0.4  --   --    More results in Results Review = values in this interval not displayed.   Glucose POCT     Recent Labs  Lab 12/25/16  0138 12/24/16  0154 12/23/16  0436 12/22/16  0116 12/21/16  1017 12/21/16  0350 12/20/16  1327   Glucose 100 117* 90 101* 106* 88 133*          Rads:     Radiology Results (24 Hour)     Procedure Component Value Units Date/Time    XR Chest AP Portable [[id number]] Collected:  12/25/16 0749    Order Status:  Completed Updated:  12/25/16 0755    Narrative:       HISTORY: dyspnea    TECHNIQUE: Single AP view of the chest.    COMPARISON: 12/24/2016.    FINDINGS: Right-sided PICC line is in place with tip projecting over the  lower SVC. The tracheostomy tube is unchanged.    Cardiac and mediastinal contours stable.   There is no significant interval change in central pulmonary vascular  engorgement and diffuse prominent interstitial markings in keeping with  edema. Layering bilateral pleural effusions are stable. Again  demonstrated are bibasilar opacities in keeping with some combination of  layering effusion, atelectasis, and/or infiltrate. No pneumothorax  demonstrated.      Impression:        Overall stable exam with pulmonary vascular engorgement and  layering bilateral pleural effusions. Stable bibasilar opacities in  keeping with some combination of atelectasis, layering pleural effusion,  and/or lower lobe infiltrates.    Gustavus Messing, MD   12/25/2016 7:51 AM            Signed by: Zachery Dauer

## 2016-12-25 NOTE — Progress Notes (Signed)
Chaplain Service      Assessment:  Spiritual Assessment: No needs    Background:  Visit Type: Follow-up [facility]) was made by Chaplain with patient, [name], based on Source: Chaplain Initiated.  Present at Visit: patient.  Spiritual Care Provided to: patient only.  Length of Visit: 0-15 minutes .    Summary:  Reason for Request: Spiritual Support, Spiritual Assessment   Spiritual Care Interventions: Provided reflective and compassionate listening, Provided emotional support   Spiritual Care Outcomes: Patient expressed appreciation of visit, Consulted with nurse      Notes:    Patient was sitting up in bed and appeared to be in good spirits. He has difficulty hearing, but was able to communicate with pen and paper. He stated that he currently does not have any spiritual needs. Pastoral presence and contact information provided. Writer briefed by attending nurse, Albin Felling.

## 2016-12-25 NOTE — Progress Notes (Signed)
Palliative Care Progress Note - [facility]    Date Time: 12/25/16 11:14 AM  Patient Name: [name],[name]  Attending Physician: Gean Quint, MD    Assessment/Plan:   83 y.o.malewith hx of C2 spine fracture, chronic respiratory failure, bed-bound.   --Acute on chronic respiratory failure remains on the vent and pressors.   --HCAP  --UTI  --Sepsis off pressors. Still on the  vent    Advance Care Plan and GOC  Advance Directive: no  Code status: Full Code   Designated agent: statutory.  Called daughter- Ms. [name] . Left a message. Encouraged to bring in patient's AD or Living will if he has any.     Agitation/restlessness: Likely delirium. Continues to improve.Calm. Hard of hearing. Able to follow commands. Appears to have difficulty in  expressing his needs. Somewhat confused. Unclear baseline.  Continue current care.  --Haldol prn. Has hardly needed it  --Simulate day and night  --Re-orientate as possible  --Pocket-talkie not available to assist with communication. .      Pain: controlled on fentanyl drip. Comfortable.     Constipation -- diarrhea resolved.  On tube feeding.   --prophylaxis/     Disposition :TBD  Will see prn. Call for any urgent questions or issues. Thank you.   Chief Complaint:   F/u agitation and restlessness; confusion    Interval Hx: seen and d/w RN- Carla.   Patient awake and alert. Waving and beckoning for company. However when asked in writing " how are you" patient responds by writing his address and daughter's phone # on the paper. When asked in writing " are you in pain?" unable to answer. Appeared confused. Follows commands but unable to express needs or answer questions appropriately when asked in writing Unclear if can read, but can definitely write.  Not sure how much his HOH impacts this. Per RN, Pocket talker not available.  On CPAP  trial.  No family at bedside.   Review of Systems   Gastrointestinal: Negative for abdominal pain, diarrhea, nausea  and vomiting.     As above.        Social History:   Code status: Full Code     Allergies:     Allergies   Allergen Reactions   . Dilaudid [Hydromorphone Hcl]        Medications:   Medication list/MAR reviewed.    Review of Systems:   Negative other than described above.    Physical Exam:   VS:   Vitals:    12/25/16 1027   BP:    Pulse:    Resp:    Temp:    SpO2: 100%     GENERAL: Appears in no obvious distress.  HEART: RRR. Normal S1, S2. No murmur appreciated.  CHEST: Breath sounds are clear bilaterally.  ABDOMEN: Soft. Non-tender. Non-distended. BS+.  GU: Deferred.  RECTAL: Deferred.  MUSCULOSKELETAL: no joint tenderness, deformities or swelling  EXTREMITIES:No edema. No clubbing. No cyanosis.  SKIN: No rash or lesion.  NEURO: Alert. Follows a few simple commands. Gesticulates and tries to mouth answers to simple direct questions.      Labs/Diagnostics:   Reviewed.   .LAB  Wt Readings from Last 5 Encounters:   12/25/16 109.1 kg (240 lb 8.4 oz)     Ht Readings from Last 1 Encounters:   12/14/16 1.778 m (5\' 10" )     Xr Chest Ap Portable    Result Date: 12/25/2016   Overall stable exam with pulmonary vascular  engorgement and layering bilateral pleural effusions. Stable bibasilar opacities in keeping with some combination of atelectasis, layering pleural effusion, and/or lower lobe infiltrates. Ronald Messing, MD 12/25/2016 7:51 AM        Signed by: Walker Shadow, MD  [facility]  [phone]

## 2016-12-25 NOTE — Progress Notes (Addendum)
Wound, Ostomy, Continence Nurse Progress Note:  Follow up for Stage 3 to coccyx.    Wound Assessment:    Stage 3 coccyx- POA-   Followed up with attending RN in regards to patient response to treatment. Patient is doing well. Wound is "dime-sized" and 100% pink tissue. More TheraHoney supplied 12/24/16 as requested by attending RN.    Recommendations/Plan:   Continue current treatment, using therahoney until wound is healed.    Please reconsult wound care if patient deteriorates or if further assistance or supplies required.         Teressa Senter, BSN, RN  Wound Care Support Team  [phone]  Spectralink: [phone]

## 2016-12-25 NOTE — Plan of Care (Signed)
Problem: Inadequate Gas Exchange  Goal: Adequate oxygenation and improved ventilation  Outcome: Progressing      Problem: Compromised Tissue integrity  Goal: Nutritional status is improving  Outcome: Progressing      Problem: Compromised Hemodynamic Status  Goal: Vital signs and fluid balance maintained/improved  Outcome: Progressing      Problem: Non-Violent Restraints Interdisciplinary Plan  Goal: Will be injury free during the use of non-violent restraints  Outcome: Progressing

## 2016-12-25 NOTE — Progress Notes (Signed)
1/16 possible d/c to [facility]; CM notified Colleen at [facility]; Dorine x [phone] to notify family today regarding DCP;     Garry Heater, RN MSN  Clinical Care Manager  [phone]

## 2016-12-26 LAB — CBC
Absolute NRBC: 0 10*3/uL
Hematocrit: 29.6 % — ABNORMAL LOW (ref 42.0–52.0)
Hgb: 9.2 g/dL — ABNORMAL LOW (ref 13.0–17.0)
MCH: 27.2 pg — ABNORMAL LOW (ref 28.0–32.0)
MCHC: 31.1 g/dL — ABNORMAL LOW (ref 32.0–36.0)
MCV: 87.6 fL (ref 80.0–100.0)
MPV: 10.2 fL (ref 9.4–12.3)
Nucleated RBC: 0 /100 WBC (ref 0.0–1.0)
Platelets: 423 10*3/uL — ABNORMAL HIGH (ref 140–400)
RBC: 3.38 10*6/uL — ABNORMAL LOW (ref 4.70–6.00)
RDW: 18 % — ABNORMAL HIGH (ref 12–15)
WBC: 16.04 10*3/uL — ABNORMAL HIGH (ref 3.50–10.80)

## 2016-12-26 LAB — GFR: EGFR: >60

## 2016-12-26 LAB — BASIC METABOLIC PANEL
Anion Gap: 9 (ref 5.0–15.0)
BUN: 16 mg/dL (ref 9.0–28.0)
CO2: 26 mEq/L (ref 22–29)
Calcium: 8.6 mg/dL (ref 7.9–10.2)
Chloride: 105 mEq/L (ref 100–111)
Creatinine: 0.7 mg/dL (ref 0.7–1.3)
Glucose: 90 mg/dL (ref 70–100)
Potassium: 3.8 mEq/L (ref 3.5–5.1)
Sodium: 140 mEq/L (ref 136–145)

## 2016-12-26 LAB — PHOSPHORUS: Phosphorus: 4.1 mg/dL (ref 2.3–4.7)

## 2016-12-26 LAB — GLUCOSE WHOLE BLOOD - POCT
Whole Blood Glucose POCT: 127 mg/dL — ABNORMAL HIGH (ref 70–100)
Whole Blood Glucose POCT: 131 mg/dL — ABNORMAL HIGH (ref 70–100)
Whole Blood Glucose POCT: 81 mg/dL (ref 70–100)

## 2016-12-26 LAB — MAGNESIUM: Magnesium: 1.9 mg/dL (ref 1.6–2.6)

## 2016-12-26 LAB — HEMOLYSIS INDEX: Hemolysis Index: 6 (ref 0–18)

## 2016-12-26 NOTE — Progress Notes (Signed)
Palliative Care Progress Note - [facility]    Date Time: 12/26/16 4:31 PM  Patient Name: [name],[name]  Attending Physician: Gean Quint, MD    Assessment/Plan:   83 y.o.malewith hx of C2 spine fracture, chronic respiratory failure, bed-bound.   --Acute on chronic respiratory failure remains on the vent and pressors.   --HCAP  --UTI  --Sepsis off pressors. Still on the  vent    Advance Care Plan and GOC  --Advance Directive: No  --Code status: Full Code   --Designated agent: Statutory. Patient is married. Has 2 daughters and a son. Per son at bedside, wife makes decision alongside with her family.  Issues discussed  --So far, family is not on the same page about goals of care. 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 He had ensured his funeral arrangements had been made.   --Overall prognosis which is poor at this time, recurrent medical issues likely to result in hospitalizations also discussed. Son was disappointed that patient had just gotten weaned off to trach collar when he contracted another bout of HCAP that landed him back on the vent and in the hospital. States patient had made remarkable improvements at [facility] and also taken a few steps while in rehab  --Long discussion about potential for recovery and other potential medical issues and complications in the future  --All questions and concerns attended.  Outcomes and Next steps  --Son states that wife  family re: artifical life prolongation and other GOC  --At this point, hopes the patient gets over current illness and returns to [facility] to continue rehab.  --informed that patient has difficulty reading. Questions have to be written boldly and with a markers for him to respond appropriately. Currently asking for wheelchair.     Agitation/restlessness: Likely delirium. Much improved. Appropriate to  simple direct questions.  Hard of hearing. Poor eyesight. Questions have to be written boldly with markers as patient cannot see or read well.   Able to follow commands. At baseline per son , able to express himself. This is limited by [facility] and poor eye sight. States patient hates to wear glasses.  Intermittently continues to pull at trach. Hence soft restraints  --Haldol prn. Has hardly needed it  --Simulate day and night  --Re-orientate as possible  --Pocket-talkie not available to assist with communication. .      Pain: Comfortable.     Constipation -- diarrhea resolved.  On tube feeding.   --prophylaxis/     Disposition :TBD    Chief Complaint:   F/u agitation and restlessness; confusion    Interval Hx: seen and d/w RN- Senait, ID- dr. Venetia Constable , pulmonary -NP- Ms. Shanda Bumps and son-dan    Visiting with his son and grandson. No apparent discomfort. In soft restrainsts. Pulling at trach intermittently. Able to communicate with questions written on bold using a Marker. Appropriate to simple direct questions. No pain. Secretions much improved. Less confused. Review of Systems   Gastrointestinal: Negative for abdominal pain, diarrhea, nausea and vomiting.     As above.        Social History:   Code status: Full Code     Allergies:     Allergies   Allergen Reactions   . Dilaudid [Hydromorphone Hcl]        Medications:   Medication list/MAR reviewed.    Review of Systems:  Negative other than described above.    Physical Exam:   VS:   Vitals:    12/26/16 1520   BP:    Pulse:    Resp:    Temp:    SpO2: 100%     GENERAL: Appears in no obvious distress.  HEART: RRR. Normal S1, S2. No murmur appreciated.  CHEST: Breath sounds are clear bilaterally.  ABDOMEN: Soft. Non-tender. Non-distended. BS+.  GU: Deferred.  RECTAL: Deferred.  MUSCULOSKELETAL: no joint tenderness, deformities or swelling  EXTREMITIES:No edema. No clubbing. No cyanosis.  SKIN: No rash or lesion.  NEURO: Alert. Follows a few simple commands. Gesticulates  and tries to mouth answers to simple direct questions.  Able to read and respond appropriately when questions are written in bold letters using a Marker. Has poor vision limiting reading ability    Labs/Diagnostics:   Reviewed.   .LAB  Wt Readings from Last 5 Encounters:   12/26/16 109.1 kg (240 lb 8.4 oz)     Ht Readings from Last 1 Encounters:   12/14/16 1.778 m (5\' 10" )     No results found.    Time in: 2.10pm/Time out: 3.15pm. More than 50% of this visit was spent in face to face consultation and discussion of diagnosis, prognosis, and treatment options.  Signed by: Walker Shadow, MD  [facility]  [phone]

## 2016-12-26 NOTE — Progress Notes (Signed)
INFECTIOUS DISEASES PROGRESS NOTE    Date Time: 12/26/16 4:00 PM  Patient Name: [name], [name]  Patient status.Inpatient  Hospital Day: 12    Assessment and Plan:     1.  Pneumonia; pseudimonas and providencia  -.  Sputum Gram stain is negative, culture results are showing GNR  2.  CO2 retention.  3.  No fever but white count has increased    PLAN: 1.Since pt is afebrile, NL PCT, neg gram stain,on CPAP will not start antibiotics now  2. Follow fever curve and WBC with diff  3. PCT tomorrow and see if it bumps  Subjective:   Trach  CPAP x 5 hr  MS at baseline  Review of Systems:   Review of Systems - melena    Antibiotics:       Other medications reviewed in EPIC.  Central Access:       Physical Exam:   BP 108/62   Pulse (!) 106   Temp 98.8 F (37.1 C) (Axillary)   Resp 21   Ht 1.778 m (5\' 10" )   Wt 109.1 kg (240 lb 8.4 oz)   SpO2 100%   BMI 34.51 kg/m @  Vitals:    12/26/16 1520   BP:    Pulse:    Resp:    Temp:    SpO2: 100%     Lungs: rhonchi on right  Heart: a fib  edema      Labs:   Pro calcitonin <0.1  Microbiology Results     Procedure Component Value Units Date/Time    Blood Culture Aerobic/Anaerobic #1 [[id number]] Collected:  12/14/16 1207    Specimen:  Arm from Blood Updated:  12/16/16 1421    Narrative:       ORDER#: [id number]                                    ORDERED BY: NITZBERG, MICHA  SOURCE: Blood arm                                    COLLECTED:  12/14/16 12:07  ANTIBIOTICS AT COLL.:                                RECEIVED :  12/14/16 14:18  Culture Blood Aerobic and Anaerobic        PRELIM      12/16/16 14:21  12/15/16   No Growth after 1 day/s of incubation.  12/16/16   No Growth after 2 day/s of incubation.      Blood Culture Aerobic/Anaerobic #2 [[id number]] Collected:  12/14/16 1318    Specimen:  Arm from Blood Updated:  12/16/16 1944    Narrative:       [name] [id number] called Micro Results of Pos Cx. Results read back by:U [id number], by  [id number] on 12/16/2016 at 19:43  ORDER#: [id number]                                     ORDERED BY: NITZBERG, MICHA  SOURCE: Blood arm  COLLECTED:  12/14/16 13:18  ANTIBIOTICS AT COLL.:                                RECEIVED :  12/14/16 17:04  J [id number] called Micro Results of Pos Cx. Results read back by:U [id number], by [id number] on 12/16/2016 at 19:43  Culture Blood Aerobic and Anaerobic        PRELIM      12/16/16 19:44   +  12/15/16   No Growth after 1 day/s of incubation.  12/16/16   No Growth after 2 day/s of incubation.             Anaerobic Blood Culture Positive in 48 to 72 hours             Gram Stain Shows: Gram positive cocci in clusters             Identification to follow      MRSA Culture [[id number]] Collected:  12/14/16 1208    Specimen:  Body Fluid from Nasal/Throat ASC Admission Updated:  12/15/16 1110    Narrative:       ORDER#: [id number]                                    ORDERED BY: [name], MICHA  SOURCE: Nares and Throat                             COLLECTED:  12/14/16 12:08  ANTIBIOTICS AT COLL.:                                RECEIVED :  12/14/16 15:39  Culture MRSA Surveillance                  FINAL       12/15/16 11:10  12/15/16   Negative for Methicillin Resistant Staph aureus from Nares and             Negative for Methicillin Resistant Staph aureus from Throat      MRSA culture (If not done in Triage) [[id number]] Collected:  12/15/16 0428    Specimen:  Body Fluid from Nasal/Throat ASC Admission Updated:  12/16/16 0003    Narrative:       ORDER#: [id number]                                    ORDERED BY: NEIGH, EMILY  SOURCE: Nares and Throat                             COLLECTED:  12/15/16 04:28  ANTIBIOTICS AT COLL.:                                RECEIVED :  12/15/16 06:53  Culture MRSA Surveillance                  FINAL       12/16/16 00:03  12/16/16   Negative for Methicillin Resistant Staph aureus from Nares and             Negative  for Methicillin Resistant Staph aureus from Throat      Rapid influenza A/B antigens  [[id number]] Collected:  12/14/16 1207    Specimen:  Nasopharyngeal from Nasal Aspirate Updated:  12/14/16 1230    Narrative:       ORDER#: [id number]                                    ORDERED BY: NITZBERG, MICHA  SOURCE: Nasal Aspirate                               COLLECTED:  12/14/16 12:07  ANTIBIOTICS AT COLL.:                                RECEIVED :  12/14/16 12:14  Influenza Rapid Antigen A&B                FINAL       12/14/16 12:29  12/14/16   Negative for Influenza A and B             Reference Range: Negative      Sputum Culture [[id number]] Collected:  12/14/16 1206    Specimen:  Sputum from Tracheal Aspirate Updated:  12/16/16 1622    Narrative:       ORDER#: [id number]                                    ORDERED BY: [name], MICHA  SOURCE: Tracheal Aspirate trach                      COLLECTED:  12/14/16 12:06  ANTIBIOTICS AT COLL.:                                RECEIVED :  12/14/16 16:16  Stain, Gram (Respiratory)                  FINAL       12/14/16 22:22   +  12/14/16   Few Squamous epithelial cells             Few WBC's             Moderate Mixed Respiratory Flora  Culture and Gram Stain, Aerobic, RespiratorPRELIM      12/16/16 16:22   +  12/15/16   Light growth of mixed upper respiratory flora  12/16/16   Heavy growth of Providencia stuartii               Further workup to follow including susceptibility testing    12/16/16   Heavy growth of Pseudomonas aeruginosa               Susceptibility to follow        Urine culture [[id number]] Collected:  12/14/16 1315    Specimen:  Urine from Urine, Catheterized, In & Out Updated:  12/16/16 1025    Narrative:       ORDER#: [id number]                                    ORDERED BY:  NITZBERG, MICHA  SOURCE: Urine, Catheterized, In & Out                COLLECTED:  12/14/16 13:15  ANTIBIOTICS AT COLL.:                                RECEIVED :  12/14/16 17:39  Culture Urine                              FINAL       12/16/16 10:25   +  12/16/16   50,000 - 70,000  CFU/ML Pseudomonas aeruginosa      _____________________________________________________________________________                                  P.aeruginosa    ANTIBIOTICS                     MIC  INTRP      _____________________________________________________________________________  Amikacin                        <=8    S        Aztreonam                        8     S        Cefepime                         4     S        Ceftazidime                      4     S        Ciprofloxacin                  <=0.5   S        Gentamicin                      <=2    S        Levofloxacin                    <=1    S        Meropenem                     <=0.25   S        Piperacillin/Tazobactam         8/4    S        Tobramycin                      <=2    S        _____________________________________________________________________________            S=SUSCEPTIBLE     I=INTERMEDIATE     R=RESISTANT                            N/S=NON-SUSCEPTIBLE  _____________________________________________________________________________            CBC w/Diff CMP     Recent Labs  Lab  12/26/16  0219 12/25/16  0138 12/24/16  0155  12/23/16  0436   WBC 16.04* 12.42* 14.32* More results in Results Review 10.94*   Hgb 9.2* 8.2* 9.2* More results in Results Review 8.0*   Hematocrit 29.6* 26.1* 28.9* More results in Results Review 25.7*   Platelets 423* 396 428* More results in Results Review 376   MCV 87.6 86.7 86.8 More results in Results Review 86.5   Neutrophils  --   --  77.8  --  72.8   More results in Results Review = values in this interval not displayed.    PT/INR           Recent Labs  Lab 12/26/16  0219 12/25/16  0138 12/24/16  0154 12/23/16  0436   Sodium 140 139 140 139   Potassium 3.8 3.7 3.5 3.5   Chloride 105 103 103 104   CO2 26 30* 29 29   BUN 16.0 17.0 15.0 20.0   Creatinine 0.7 0.6* 0.5* 0.6*   Glucose 90 100 117* 90   Calcium 8.6 8.6 8.4 8.1   Magnesium 1.9 1.5*  --  1.2*   Phosphorus 4.1 3.4  --  3.5   Protein, Total  --    --  5.1* 4.4*   Albumin  --   --  2.0* 1.7*   AST (SGOT)  --   --  11 9   ALT  --   --  18 13   Alkaline Phosphatase  --   --  57 51   Bilirubin, Total  --   --  0.3 0.4      Glucose POCT     Recent Labs  Lab 12/26/16  0219 12/25/16  0138 12/24/16  0154 12/23/16  0436 12/22/16  0116 12/21/16  1017 12/21/16  0350   Glucose 90 100 117* 90 101* 106* 88          Rads:     Radiology Results (24 Hour)     ** No results found for the last 24 hours. **            Signed by: Zachery Dauer

## 2016-12-26 NOTE — Plan of Care (Signed)
Problem: Inadequate Gas Exchange  Goal: Adequate oxygenation and improved ventilation  Outcome: Progressing   12/22/16 1519   Goal/Interventions addressed this shift   Adequate oxygenation and improved ventilation Assess lung sounds;Monitor SpO2 and treat as needed;Provide mechanical and oxygen support to facilitate gas exchange;Position for maximum ventilatory efficiency;Consult/collaborate with Respiratory Therapy

## 2016-12-26 NOTE — Plan of Care (Signed)
Problem: Inadequate Gas Exchange  Goal: Adequate oxygenation and improved ventilation  Outcome: Progressing   12/26/16 1843   Goal/Interventions addressed this shift   Adequate oxygenation and improved ventilation Assess lung sounds;Monitor SpO2 and treat as needed;Monitor and treat ETCO2;Provide mechanical and oxygen support to facilitate gas exchange;Position for maximum ventilatory efficiency;Teach/reinforce use of incentive spirometer 10 times per hour while awake, cough and deep breath as needed;Plan activities to conserve energy: plan rest periods;Consult/collaborate with Respiratory Therapy;Increase activity as tolerated/progressive mobility

## 2016-12-26 NOTE — Progress Notes (Signed)
ICU Daily Progress Note        Date Time: 12/26/16 3:05 PM  Patient Name: [name],[name]  Attending Physician: Gean Quint, MD  Room: [id number]   Admit Date: 12/14/2016  LOS: 12 days       ICU problem list: Acute respiratory failure, Pneumonia, Septic shock, UTI, Afib, HTN      Assessment:   . Acute on chronic respiratory failure   . Septic shock   . Pneumonia, pseudomonas and providencia   . UTI, pseudomonas   . GIB, melana per stool  . Atrial fibrillation   . Anemia   . DVT RUE   . Hx c spine injury requiring trach/peg  . Thrombocytopenia, resolved     Plan:     . On CPAP. On neb  . Maintains MAP greater than 65. Not on pressors.   . Continue to monitor off antibiotics. Afebrile. Monitor chest xray. ID following.   Marland Kitchen PPI daily  . H/H stable  . Amiodarone PO. HR 100s    . Continue current Xarelto dose  . Hemodynamically stable, transfer orders to tele     Code Status: full code    Subjective:     Review of Systems   Constitutional: Negative for chills and fever.   Respiratory: Negative for cough and shortness of breath.    Cardiovascular: Negative for chest pain and palpitations.   Gastrointestinal: Negative for abdominal pain and nausea.   Neurological: Negative for dizziness and headaches.     Medications:      Scheduled Meds: PRN Meds:      amiodarone 400 mg per G tube Daily   atorvastatin 20 mg Oral QHS   budesonide 0.5 mg Nebulization BID   glycopyrrolate 1 mg Oral Daily   mirtazapine 15 mg Oral QHS   multivitamin 1 tablet Oral Daily   pantoprazole 40 mg Oral Daily   rivaroxaban 20 mg Oral Daily with dinner   sodium chloride (PF) 3 mL Intravenous Q8H       Continuous Infusions:     sodium chloride  PRN   acetaminophen 650 mg Q6H PRN   albuterol-ipratropium 3 mL Q4H PRN   ALPRAZolam 0.25 mg Q8H PRN   calcium GLUConate 1 g PRN   dextrose 15 g of glucose PRN   And     dextrose 12.5 g PRN   And     glucagon (rDNA) 1 mg PRN   fentaNYL (PF) 50 mcg Q1H PRN   glycopyrrolate 0.2 mg Q8H PRN   magnesium  sulfate 1 g PRN   metoclopramide 5 mg TID PRN   ondansetron 4 mg Q8H PRN   potassium chloride 20 mEq PRN   sodium phosphate IVPB 15 mmol PRN   sodium phosphate IVPB 25 mmol PRN   sodium phosphate IVPB 35 mmol PRN         Physical Exam:     Physical Exam   Constitutional: No distress.   Neck: Neck supple.   Cardiovascular: An irregularly irregular rhythm present.   Pulmonary/Chest:   Course breath sounds bilaterally, diminished at bases   Abdominal: Soft. Bowel sounds are normal.   + PEG   Musculoskeletal: He exhibits edema.   Neurological: He is alert.   Nods appropriately   Follow simple commands    Skin: Skin is warm.     Data:   Invasive ICU Hemodynamics:    IBW/kg (Calculated) : 73    Vent Settings:    IBW: Vent Settings  Vent Mode:  PS/CPAP  FiO2: 35 %  Resp Rate (Set): 20  Vt (Set, mL): 500 mL  PIP Observed (cm H2O): 15 cm H2O  PEEP/EPAP: 5 cm H20  Pressure Support / IPAP: 10 cmH20  Mean Airway Pressure: 8 cmH20    Patient Lines/Drains/Airways Status    Active PICC Line / CVC Line / PIV Line / Drain / Airway / Intraosseous Line / Epidural Line / ART Line / Line / Wound / Pressure Ulcer / NG/OG Tube     Name:   Placement date:   Placement time:   Site:   Days:    PICC Double Lumen 12/17/16 Right Brachial  12/17/16    1330    Brachial    9    Gastrostomy/Enterostomy Gastrostomy RUQ          RUQ        Urethral Catheter 18 Fr.  12/17/16    1759        8    Surgical Airway Shiley 6 mm Cuffed;Long  12/14/16    1855    6 mm    11    Wound 12/14/16 Pressure Injury Sacrum Red open area with drainage  12/14/16    1600    Sacrum    11    Wound 12/17/16 Skin Tear Arm Left;Lower skin tear  12/17/16    0300    Arm    9    Wound 12/17/16 Other (Comment) Leg Anterior  12/17/16    1539    Leg    8                VITAL SIGNS   Temp:  [97.7 F (36.5 C)-99.2 F (37.3 C)] 98.8 F (37.1 C)  Heart Rate:  [82-126] 106  Resp Rate:  [8-25] 21  BP: (73-141)/(51-85) 108/62  FiO2:  [35 %] 35 %      Intake/Output Summary (Last 24 hours)  at 12/26/16 1505  Last data filed at 12/26/16 0600   Gross per 24 hour   Intake              920 ml   Output             1315 ml   Net             -395 ml        Labs:     CBC w/Diff CMP     Recent Labs  Lab 12/26/16  0219 12/25/16  0138 12/24/16  0155  12/23/16  0436   WBC 16.04* 12.42* 14.32* More results in Results Review 10.94*   Hgb 9.2* 8.2* 9.2* More results in Results Review 8.0*   Hematocrit 29.6* 26.1* 28.9* More results in Results Review 25.7*   Platelets 423* 396 428* More results in Results Review 376   MCV 87.6 86.7 86.8 More results in Results Review 86.5   Neutrophils  --   --  77.8  --  72.8   More results in Results Review = values in this interval not displayed.       PT/INR     Recent Labs  Lab 12/19/16  1547   PT INR 1.2*         Recent Labs  Lab 12/26/16  0219 12/25/16  0138 12/24/16  0154 12/23/16  0436   Sodium 140 139 140 139   Potassium 3.8 3.7 3.5 3.5   Chloride 105 103 103 104   CO2 26 30* 29 29  BUN 16.0 17.0 15.0 20.0   Creatinine 0.7 0.6* 0.5* 0.6*   Glucose 90 100 117* 90   Calcium 8.6 8.6 8.4 8.1   Magnesium 1.9 1.5*  --  1.2*   Phosphorus 4.1 3.4  --  3.5   Protein, Total  --   --  5.1* 4.4*   Albumin  --   --  2.0* 1.7*   AST (SGOT)  --   --  11 9   ALT  --   --  18 13   Alkaline Phosphatase  --   --  57 51   Bilirubin, Total  --   --  0.3 0.4           @lababg @   Glucose POCT     Recent Labs  Lab 12/26/16  0219 12/25/16  0138 12/24/16  0154 12/23/16  0436 12/22/16  0116 12/21/16  1017 12/21/16  0350   Glucose 90 100 117* 90 101* 106* 88        Rads:   Radiological Imaging personally reviewed.    I have personally reviewed the patient's history and 24 hour interval events, along with vitals, labs, radiology images.     So far today I have spent 35 critical care minutes providing care for this patient excluding teaching and billable procedures, and not overlapping with any other providers.    Signed by: [name], MD.  Date/Time: 12/26/16 3:05 PM

## 2016-12-27 LAB — PROCALCITONIN: Procalcitonin: <0.1 (ref 0.0–0.1)

## 2016-12-27 LAB — BASIC METABOLIC PANEL
Anion Gap: 6 (ref 5.0–15.0)
BUN: 21 mg/dL (ref 9.0–28.0)
CO2: 28 mEq/L (ref 22–29)
Calcium: 8.1 mg/dL (ref 7.9–10.2)
Chloride: 106 mEq/L (ref 100–111)
Creatinine: 0.7 mg/dL (ref 0.7–1.3)
Glucose: 102 mg/dL — ABNORMAL HIGH (ref 70–100)
Potassium: 3.7 mEq/L (ref 3.5–5.1)
Sodium: 140 mEq/L (ref 136–145)

## 2016-12-27 LAB — CBC
Absolute NRBC: 0 10*3/uL
Hematocrit: 26.8 % — ABNORMAL LOW (ref 42.0–52.0)
Hgb: 8.4 g/dL — ABNORMAL LOW (ref 13.0–17.0)
MCH: 27.5 pg — ABNORMAL LOW (ref 28.0–32.0)
MCHC: 31.3 g/dL — ABNORMAL LOW (ref 32.0–36.0)
MCV: 87.9 fL (ref 80.0–100.0)
MPV: 9.6 fL (ref 9.4–12.3)
Nucleated RBC: 0 /100 WBC (ref 0.0–1.0)
Platelets: 374 10*3/uL (ref 140–400)
RBC: 3.05 10*6/uL — ABNORMAL LOW (ref 4.70–6.00)
RDW: 18 % — ABNORMAL HIGH (ref 12–15)
WBC: 14.94 10*3/uL — ABNORMAL HIGH (ref 3.50–10.80)

## 2016-12-27 LAB — PHOSPHORUS: Phosphorus: 3.6 mg/dL (ref 2.3–4.7)

## 2016-12-27 LAB — GFR: EGFR: >60

## 2016-12-27 LAB — MAGNESIUM: Magnesium: 1.6 mg/dL (ref 1.6–2.6)

## 2016-12-27 LAB — GLUCOSE WHOLE BLOOD - POCT
Whole Blood Glucose POCT: 95 mg/dL (ref 70–100)
Whole Blood Glucose POCT: 107 mg/dL — ABNORMAL HIGH (ref 70–100)

## 2016-12-27 LAB — HEMOLYSIS INDEX: Hemolysis Index: 7 (ref 0–18)

## 2016-12-27 NOTE — Plan of Care (Addendum)
Problem: Inadequate Gas Exchange  Goal: Adequate oxygenation and improved ventilation  Outcome: Progressing   12/27/16 1121   Goal/Interventions addressed this shift   Adequate oxygenation and improved ventilation Monitor SpO2 and treat as needed;Assess lung sounds;Teach/reinforce use of incentive spirometer 10 times per hour while awake, cough and deep breath as needed;Position for maximum ventilatory efficiency;Provide mechanical and oxygen support to facilitate gas exchange;Monitor and treat ETCO2;Plan activities to conserve energy: plan rest periods;Consult/collaborate with Respiratory Therapy;Increase activity as tolerated/progressive mobility

## 2016-12-27 NOTE — Plan of Care (Addendum)
Pt is discharged to [facility], IV line and telemetry monitor removed. No bleeding noted from iv site. Dry gauzes applied over iv site. Report is given to assigned RN at [facility].

## 2016-12-27 NOTE — Progress Notes (Signed)
Palliative Care Progress Note - [facility]    Date Time: 12/27/16 12:46 PM  Patient Name: YNWGNFA,OZHY GRADY  Attending Physician: Gean Quint, MD    Assessment/Plan:   83 y.o.malewith hx of C2 spine fracture, chronic respiratory failure, bed-bound.   --Acute on chronic respiratory failure. Off pressor. Remains ok.   --HCAP  --UTI      Agitation/restlessness: much improved. More cooperative and able to follow commands and express self better with Marker written communication on paper. Intermittently confused.   --Haldol prn. Has hardly needed it  --Simulate day and night  --Re-orientate as possible  --Pocket-talkie not available to assist with communication. .      Pain: Comfortable.     Constipation: intermittent loose stools on PEG tube feeding.       Disposition :planned to d/c back to [facility] today. Will sign off at this time. Kindly re-consult needed. Thanks    Chief Complaint:   F/u agitation and restlessness; confusion    Interval Hx: seen and d/w RN- Senait   Much improved. Cheerful. Of restraints. Understands why in place now. Per RN, his son explained to him via writing with a marker on paper. States patient back to baseline. Able to write needs. Stating he wants to go to the bathroom. Intermittently  confused. Review of Systems   Gastrointestinal: Negative for abdominal pain, diarrhea, nausea and vomiting.     As above.        Social History:   Code status: Full Code     Allergies:     Allergies   Allergen Reactions   . Dilaudid [Hydromorphone Hcl]        Medications:   Medication list/MAR reviewed.    Review of Systems:   Negative other than described above.    Physical Exam:   VS:   Vitals:    12/27/16 1204   BP:    Pulse:    Resp:    Temp: 97.5 F (36.4 C)   SpO2:      GENERAL: Appears in no obvious distress.  HEART: RRR. Normal S1, S2. No murmur appreciated.  CHEST: Breath sounds reduced at bases..  ABDOMEN: Soft. Non-tender. Non-distended. BS+.  GU: Deferred.  RECTAL:  Deferred.  MUSCULOSKELETAL: no joint tenderness, deformities or swelling  EXTREMITIES:No edema. No clubbing. No cyanosis.  SKIN: No rash or lesion.  NEURO: Alert. Follow commands. Tries to write/mouth his needs.   Labs/Diagnostics:   Reviewed.   .LAB  Wt Readings from Last 5 Encounters:   12/26/16 109.1 kg (240 lb 8.4 oz)     Ht Readings from Last 1 Encounters:   12/14/16 1.778 m (5\' 10" )     No results found.    Signed by: Walker Shadow, MD  [facility]  [phone]
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
## 2016-12-27 NOTE — Progress Notes (Signed)
Pt is being discharged to [facility], [place] [facility], at 2:00PM    Pt/family/floor nurse aware and agreement to plan.     Rm: 51A, [id number]         12/27/16 1341   Discharge Disposition   Patient preference/choice provided? Yes   Physical Discharge Disposition SNF   Receiving facility, unit and room number: [facility]    Nursing report phone number: [phone]   Facility fax number: [phone]   Mode of Transportation Ambulance  (PTS to pick up at 2pm)   Pick up time 2pm   Patient/Family/POA notified of transfer plan Yes   Patient agreeable to discharge plan/expected d/c date? Yes   Family/POA agreeable to discharge plan/expected d/c date? Yes   Bedside nurse notified of transport plan? Yes   IV antibiotics post discharge? N/A   Wound care post discharge? N/A   CM Interventions   Follow up appointment scheduled? Yes  (Pt will have follow-up appt with facility MD)   Referral made for home health RN visit? No, Other (comment)   Multidisciplinary rounds/family meeting before d/c? Yes   Medicare Checklist   Is this a Medicare patient? Yes   Patient received 1st IMM Letter? No   3 midnight inpatient qualifying stay (SNF only) Yes   If LOS 3 days or greater, did patient received 2nd IMM Letter? No   Medicaid/DMAS   Medicaid Pre-Screening completed No   DMAS 95 MI/MR completed and faxed Yes   DMAS 95 MI/MR trigger Level 2 Screening? No   [place] pre[place] completed? No   [place] completed? No   [place]/MR completed? No     Titus Mould, BSN, RN  ([facility] x [phone])  [facility]  Case Management Department

## 2016-12-28 LAB — BASIC METABOLIC PANEL
BUN: 19 mg/dL (ref 9.0–28.0)
CO2: 26 mEq/L (ref 21–29)
Calcium: 8.2 mg/dL (ref 7.9–10.2)
Chloride: 106 mEq/L (ref 100–111)
Creatinine: 0.6 mg/dL (ref 0.5–1.5)
Glucose: 79 mg/dL (ref 70–100)
Potassium: 4.4 mEq/L (ref 3.5–5.1)
Sodium: 140 mEq/L (ref 136–145)

## 2016-12-28 LAB — CBC
Absolute NRBC: 0 10*3/uL
Hematocrit: 28.4 % — ABNORMAL LOW (ref 42.0–52.0)
Hgb: 8.8 g/dL — ABNORMAL LOW (ref 13.0–17.0)
MCH: 27.5 pg — ABNORMAL LOW (ref 28.0–32.0)
MCHC: 31 g/dL — ABNORMAL LOW (ref 32.0–36.0)
MCV: 88.8 fL (ref 80.0–100.0)
MPV: 10.9 fL (ref 9.4–12.3)
Nucleated RBC: 0 /100 WBC (ref 0.0–1.0)
Platelets: 340 10*3/uL (ref 140–400)
RBC: 3.2 10*6/uL — ABNORMAL LOW (ref 4.70–6.00)
RDW: 18 % — ABNORMAL HIGH (ref 12–15)
WBC: 12.09 10*3/uL — ABNORMAL HIGH (ref 3.50–10.80)

## 2016-12-28 LAB — GFR: EGFR: >60

## 2016-12-28 LAB — HEMOLYSIS INDEX: Hemolysis Index: 13 (ref 0–18)
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
## 2017-01-01 ENCOUNTER — Encounter (INDEPENDENT_AMBULATORY_CARE_PROVIDER_SITE_OTHER): Payer: Self-pay

## 2017-01-01 NOTE — Progress Notes (Signed)
TCM Medicare Focus Diagnosis Navigator  Initial Call    Enrollment    Call participant, relationship to patient and contact information: Hughie Closs from [place] @[phone]    HIPAA verification completed (verify patient's DOB) (Y/N): Yes     Primary contact at facility for follow-up calls: Yes     Name and number of TCM Medicare Navigator provided (Y/N): Yes     Informed of role of TCM Medicare Navigator and duration of services provided (Y/N): Yes       Patient Information    Hospital Admission Date: 12/24/2016    Hospital Discharge Date: 12/27/2016    Index Admission Diagnosis: PNA    Hospital Discharge Disposition: SNF    Agencies/Facility Involved in Care: [name]     Expected length of stay at facility: Pending     Does the patient have an advance directive? No     If the patient has an advance directive, did the facility receive it at time of discharge from the hospital and admission to the facility? No       Physician Involvement    Physician involved in care at facility (medical director, SNFist, community PCP): Dr. Carilyn Goodpasture     Was patient seen by a physician or nurse practitioner within 24 hours of admission to the facility? (Y/N): Yes     PCP Name and Number: Al Carlyon Prows, MD [phone]     PCP's office notified telephonically and in writing of patient's hospitalization, current level of care, and Navigator involvement (Y/N):          Teach Back    For Service Provider/Facility:      Informed facility staff of "[facility] 90 Day Commitment to Care" Document and sent document to facility (Y/N): No, Navigator faxed paper    Was facility notified that Navigator or ED should be called PRIOR to any return to the hospital in an effort to see if other interventions could be put in place to prevent a readmission (Y/N): Yes      Medications    For Facility:       Was the patient seen by a physician within 24 hours of arriving to the facility?  (Y/N): Yes      Did patient/facility obtain all medications after  hospital discharge? (Y/N): Yes      Was a medication reconciliation completed after discharge by the facility? (Y/N): Yes     Are there concerns or questions about patient's medications? (Y/N): No      Follow Up    Follow up needs: No     Follow up plan: Navigator will continue to f/u with pt      Comments    Navigator made outreach to pt's nurse Adelaide from [facility] @[phone]. Nurse reported pt is doing well, pt has all medications, pt is engaging in therapy, pt has no needs or concerns at this moment.    Navigator will continue to f/u with pt.      Sekou Zuckerman BS   TCM Medicare Focus navigator   [phone]

## 2017-01-01 NOTE — Progress Notes (Signed)
TCM Medicare Navigator    Navigator made outreach to pt's pcp @[phone] and informed the office of pt's recent admission. Navigator was informed pt's physician is a hospitalist and doesn't have an office. Navigator was provided with pcp's fax number. Navigator sent over d/c summary and and an MFN letter to the pcp.    Navigator will continue to f/u with pt.      Ferrell Flam BS   TCM Medicare Focus navigator   [phone]

## 2017-01-04 ENCOUNTER — Encounter (INDEPENDENT_AMBULATORY_CARE_PROVIDER_SITE_OTHER): Payer: Self-pay

## 2017-01-04 LAB — CBC
Absolute NRBC: 0 10*3/uL
Hematocrit: 31.5 % — ABNORMAL LOW (ref 42.0–52.0)
Hgb: 9.8 g/dL — ABNORMAL LOW (ref 13.0–17.0)
MCH: 26.5 pg — ABNORMAL LOW (ref 28.0–32.0)
MCHC: 31.1 g/dL — ABNORMAL LOW (ref 32.0–36.0)
MCV: 85.1 fL (ref 80.0–100.0)
MPV: 11 fL (ref 9.4–12.3)
Nucleated RBC: 0 /100 WBC (ref 0.0–1.0)
Platelets: 313 10*3/uL (ref 140–400)
RBC: 3.7 10*6/uL — ABNORMAL LOW (ref 4.70–6.00)
RDW: 16 % — ABNORMAL HIGH (ref 12–15)
WBC: 11.3 10*3/uL — ABNORMAL HIGH (ref 3.50–10.80)

## 2017-01-04 LAB — BASIC METABOLIC PANEL
BUN: 29 mg/dL — ABNORMAL HIGH (ref 9.0–28.0)
CO2: 32 mEq/L — ABNORMAL HIGH (ref 21–29)
Calcium: 8.7 mg/dL (ref 7.9–10.2)
Chloride: 98 mEq/L — ABNORMAL LOW (ref 100–111)
Creatinine: 0.7 mg/dL (ref 0.5–1.5)
Glucose: 114 mg/dL — ABNORMAL HIGH (ref 70–100)
Potassium: 3.9 mEq/L (ref 3.5–5.1)
Sodium: 139 mEq/L (ref 136–145)

## 2017-01-04 LAB — GFR: EGFR: >60

## 2017-01-04 LAB — HEMOLYSIS INDEX: Hemolysis Index: 8 (ref 0–18)

## 2017-01-04 NOTE — Progress Notes (Signed)
TCM Medicare Focus Diagnosis Navigator  Follow-Up Call    Call participant, relationship to patient and contact information: Amy from [facility] @[phone]    Patient Information    Agencies/Facility Involved in Care: [name]     Current number of days in facility: 3    Expected length of stay at facility and/or expected discharge day from facility: Pending     Was facility reminded that Navigator or ED should be called PRIOR to any return to the hospital in an effort to see if other interventions could be put in place to prevent a readmission (Y/N): Yes      Needs    Follow up needs: No     Barriers or concerns to continued wellness in the community: No     PCP or facility physician notified for any reason: No     Referrals placed: PT and OT     Interventions or actions taken: No    Follow up plan: Navigator will continue to f/u with pt      Comments    Navigator made outreach to pt's nurse Amy from [facility] @[phone]. Nurse reported pt is doing well, pt has no needs or concerns at this moment.    Navigator will continue to f/u with pt.      [name] BS   TCM Medicare Focus navigator   [phone]

## 2017-01-08 ENCOUNTER — Emergency Department: Payer: Medicare Other

## 2017-01-08 ENCOUNTER — Encounter (INDEPENDENT_AMBULATORY_CARE_PROVIDER_SITE_OTHER): Payer: Self-pay

## 2017-01-08 ENCOUNTER — Inpatient Hospital Stay: Payer: Medicare Other | Admitting: Pulmonary Disease

## 2017-01-08 ENCOUNTER — Inpatient Hospital Stay
Admission: EM | Admit: 2017-01-08 | Discharge: 2017-01-30 | DRG: 870 | Disposition: A | Discharge status: Skilled Nursing Facility | Payer: Medicare Other | Attending: Internal Medicine | Admitting: Internal Medicine

## 2017-01-08 DIAGNOSIS — B964 Proteus (mirabilis) (morganii) as the cause of diseases classified elsewhere: Secondary | ICD-10-CM | POA: Diagnosis present

## 2017-01-08 DIAGNOSIS — R4182 Altered mental status, unspecified: Secondary | ICD-10-CM | POA: Diagnosis present

## 2017-01-08 DIAGNOSIS — Z880 Allergy status to penicillin: Secondary | ICD-10-CM

## 2017-01-08 DIAGNOSIS — K9423 Gastrostomy malfunction: Secondary | ICD-10-CM | POA: Diagnosis present

## 2017-01-08 DIAGNOSIS — Z9911 Dependence on respirator [ventilator] status: Secondary | ICD-10-CM

## 2017-01-08 DIAGNOSIS — D649 Anemia, unspecified: Secondary | ICD-10-CM | POA: Diagnosis present

## 2017-01-08 DIAGNOSIS — G4700 Insomnia, unspecified: Secondary | ICD-10-CM | POA: Diagnosis present

## 2017-01-08 DIAGNOSIS — Y833 Surgical operation with formation of external stoma as the cause of abnormal reaction of the patient, or of later complication, without mention of misadventure at the time of the procedure: Secondary | ICD-10-CM | POA: Diagnosis present

## 2017-01-08 DIAGNOSIS — R6520 Severe sepsis without septic shock: Secondary | ICD-10-CM | POA: Diagnosis present

## 2017-01-08 DIAGNOSIS — I482 Chronic atrial fibrillation: Secondary | ICD-10-CM | POA: Diagnosis present

## 2017-01-08 DIAGNOSIS — K56609 Unspecified intestinal obstruction, unspecified as to partial versus complete obstruction: Secondary | ICD-10-CM | POA: Diagnosis not present

## 2017-01-08 DIAGNOSIS — G9341 Metabolic encephalopathy: Secondary | ICD-10-CM | POA: Diagnosis present

## 2017-01-08 DIAGNOSIS — Z930 Tracheostomy status: Secondary | ICD-10-CM

## 2017-01-08 DIAGNOSIS — A419 Sepsis, unspecified organism: Principal | ICD-10-CM | POA: Diagnosis present

## 2017-01-08 DIAGNOSIS — J9620 Acute and chronic respiratory failure, unspecified whether with hypoxia or hypercapnia: Secondary | ICD-10-CM | POA: Diagnosis present

## 2017-01-08 DIAGNOSIS — I10 Essential (primary) hypertension: Secondary | ICD-10-CM | POA: Diagnosis present

## 2017-01-08 DIAGNOSIS — Z7901 Long term (current) use of anticoagulants: Secondary | ICD-10-CM

## 2017-01-08 DIAGNOSIS — G8250 Quadriplegia, unspecified: Secondary | ICD-10-CM | POA: Diagnosis present

## 2017-01-08 DIAGNOSIS — L89152 Pressure ulcer of sacral region, stage 2: Secondary | ICD-10-CM | POA: Diagnosis present

## 2017-01-08 DIAGNOSIS — Z781 Physical restraint status: Secondary | ICD-10-CM

## 2017-01-08 DIAGNOSIS — T360X5A Adverse effect of penicillins, initial encounter: Secondary | ICD-10-CM | POA: Diagnosis not present

## 2017-01-08 DIAGNOSIS — J156 Pneumonia due to other aerobic Gram-negative bacteria: Secondary | ICD-10-CM | POA: Diagnosis present

## 2017-01-08 DIAGNOSIS — I429 Cardiomyopathy, unspecified: Secondary | ICD-10-CM | POA: Diagnosis present

## 2017-01-08 DIAGNOSIS — J151 Pneumonia due to Pseudomonas: Secondary | ICD-10-CM | POA: Diagnosis present

## 2017-01-08 DIAGNOSIS — D6959 Other secondary thrombocytopenia: Secondary | ICD-10-CM | POA: Diagnosis not present

## 2017-01-08 LAB — COMPREHENSIVE METABOLIC PANEL
ALT: 24 U/L (ref 0–55)
AST (SGOT): 24 U/L (ref 5–34)
Albumin/Globulin Ratio: 0.7 — ABNORMAL LOW (ref 0.9–2.2)
Albumin: 2.5 g/dL — ABNORMAL LOW (ref 3.5–5.0)
Alkaline Phosphatase: 67 U/L (ref 38–106)
Anion Gap: 9 (ref 5.0–15.0)
BUN: 32 mg/dL — ABNORMAL HIGH (ref 9.0–28.0)
Bilirubin, Total: 0.4 mg/dL (ref 0.2–1.2)
CO2: 30 mEq/L — ABNORMAL HIGH (ref 22–29)
Calcium: 9.1 mg/dL (ref 7.9–10.2)
Chloride: 101 mEq/L (ref 100–111)
Creatinine: 0.8 mg/dL (ref 0.7–1.3)
Globulin: 3.6 g/dL (ref 2.0–3.6)
Glucose: 179 mg/dL — ABNORMAL HIGH (ref 70–100)
Potassium: 4.4 mEq/L (ref 3.5–5.1)
Protein, Total: 6.1 g/dL (ref 6.0–8.3)
Sodium: 140 mEq/L (ref 136–145)

## 2017-01-08 LAB — CBC AND DIFFERENTIAL
Absolute NRBC: 0 10*3/uL
Basophils Absolute Automated: 0.05 10*3/uL (ref 0.00–0.20)
Basophils Automated: 0.2 %
Eosinophils Absolute Automated: 0.08 10*3/uL (ref 0.00–0.70)
Eosinophils Automated: 0.3 %
Hematocrit: 34.7 % — ABNORMAL LOW (ref 42.0–52.0)
Hgb: 10.5 g/dL — ABNORMAL LOW (ref 13.0–17.0)
Immature Granulocytes Absolute: 0.83 10*3/uL — ABNORMAL HIGH
Immature Granulocytes: 3.1 %
Lymphocytes Absolute Automated: 0.41 10*3/uL — ABNORMAL LOW (ref 0.50–4.40)
Lymphocytes Automated: 1.5 %
MCH: 26.7 pg — ABNORMAL LOW (ref 28.0–32.0)
MCHC: 30.3 g/dL — ABNORMAL LOW (ref 32.0–36.0)
MCV: 88.3 fL (ref 80.0–100.0)
MPV: 10.5 fL (ref 9.4–12.3)
Monocytes Absolute Automated: 0.91 10*3/uL (ref 0.00–1.20)
Monocytes: 3.4 %
Neutrophils Absolute: 24.2 10*3/uL — ABNORMAL HIGH (ref 1.80–8.10)
Neutrophils: 91.5 %
Nucleated RBC: 0 /100 WBC (ref 0.0–1.0)
Platelets: 349 10*3/uL (ref 140–400)
RBC: 3.93 10*6/uL — ABNORMAL LOW (ref 4.70–6.00)
RDW: 16 % — ABNORMAL HIGH (ref 12–15)
WBC: 26.48 10*3/uL — ABNORMAL HIGH (ref 3.50–10.80)

## 2017-01-08 LAB — URINALYSIS, REFLEX TO MICROSCOPIC EXAM IF INDICATED
Bilirubin, UA: NEGATIVE
Glucose, UA: 50 — AB
Ketones UA: NEGATIVE
Nitrite, UA: NEGATIVE
Protein, UR: 100 — AB
Specific Gravity UA: 1.017 (ref 1.001–1.035)
Urine pH: 6 (ref 5.0–8.0)
Urobilinogen, UA: NEGATIVE mg/dL

## 2017-01-08 LAB — LACTIC ACID, PLASMA
Lactic Acid: 3.2 mmol/L — ABNORMAL HIGH (ref 0.2–2.0)
Lactic Acid: 2.2 mmol/L — ABNORMAL HIGH (ref 0.2–2.0)

## 2017-01-08 LAB — TROPONIN I
Troponin I: 0.03 ng/mL (ref 0.00–0.09)
Troponin I: 0.1 ng/mL — ABNORMAL HIGH (ref 0.00–0.09)

## 2017-01-08 LAB — HEMOLYSIS INDEX: Hemolysis Index: 0 (ref 0–18)

## 2017-01-08 LAB — GLUCOSE WHOLE BLOOD - POCT: Whole Blood Glucose POCT: 140 mg/dL — ABNORMAL HIGH (ref 70–100)

## 2017-01-08 LAB — GFR: EGFR: >60

## 2017-01-08 MED ORDER — ONDANSETRON HCL 4 MG/2ML IJ SOLN
4.0000 mg | Freq: Four times a day (QID) | INTRAMUSCULAR | Status: DC | PRN
Start: 2017-01-08 — End: 2017-01-30
  Administered 2017-01-15 – 2017-01-24 (×2): 4 mg via INTRAVENOUS
  Filled 2017-01-08 (×2): qty 2

## 2017-01-08 MED ORDER — ALBUTEROL-IPRATROPIUM 2.5-0.5 (3) MG/3ML IN SOLN
3.0000 mL | RESPIRATORY_TRACT | Status: DC
Start: 2017-01-08 — End: 2017-01-12
  Administered 2017-01-08 – 2017-01-12 (×23): 3 mL via RESPIRATORY_TRACT
  Filled 2017-01-08 (×22): qty 3

## 2017-01-08 MED ORDER — SCOPOLAMINE 1 MG/3DAYS TD PT72
1.0000 | MEDICATED_PATCH | TRANSDERMAL | Status: DC
Start: 2017-01-10 — End: 2017-01-30
  Administered 2017-01-10 – 2017-01-28 (×7): 1 via TRANSDERMAL
  Filled 2017-01-08 (×8): qty 1

## 2017-01-08 MED ORDER — ACETAMINOPHEN 650 MG RE SUPP
650.0000 mg | RECTAL | Status: DC | PRN
Start: 2017-01-08 — End: 2017-01-30

## 2017-01-08 MED ORDER — OXYCODONE-ACETAMINOPHEN 5-325 MG PO TABS
1.0000 | ORAL_TABLET | Freq: Four times a day (QID) | ORAL | Status: DC | PRN
Start: 2017-01-08 — End: 2017-01-30
  Administered 2017-01-12 – 2017-01-27 (×14): 1 via GASTROSTOMY
  Filled 2017-01-08 (×14): qty 1

## 2017-01-08 MED ORDER — RISAQUAD PO CAPS
1.0000 | ORAL_CAPSULE | Freq: Every day | ORAL | Status: DC
Start: 2017-01-09 — End: 2017-01-30
  Administered 2017-01-10 – 2017-01-30 (×21): 1 via GASTROSTOMY
  Filled 2017-01-08 (×22): qty 1

## 2017-01-08 MED ORDER — GLYCOPYRROLATE 1 MG PO TABS
1.0000 mg | ORAL_TABLET | Freq: Every day | ORAL | Status: DC
Start: 2017-01-09 — End: 2017-01-30
  Administered 2017-01-10 – 2017-01-30 (×21): 1 mg via GASTROSTOMY
  Filled 2017-01-08 (×26): qty 1

## 2017-01-08 MED ORDER — VANCOMYCIN HCL 1000 MG IV SOLR
15.0000 mg/kg | Freq: Once | INTRAVENOUS | Status: AC
Start: 2017-01-08 — End: 2017-01-08
  Administered 2017-01-08: 1750 mg via INTRAVENOUS
  Filled 2017-01-08: qty 1750

## 2017-01-08 MED ORDER — AMIODARONE HCL 200 MG PO TABS
200.0000 mg | ORAL_TABLET | Freq: Every day | ORAL | Status: DC
Start: 2017-01-09 — End: 2017-01-30
  Administered 2017-01-10 – 2017-01-30 (×21): 200 mg via ORAL
  Filled 2017-01-08 (×23): qty 1

## 2017-01-08 MED ORDER — ATORVASTATIN CALCIUM 20 MG PO TABS
20.0000 mg | ORAL_TABLET | Freq: Every evening | ORAL | Status: DC
Start: 2017-01-08 — End: 2017-01-30
  Administered 2017-01-09 – 2017-01-29 (×21): 20 mg via GASTROSTOMY
  Filled 2017-01-08 (×24): qty 1

## 2017-01-08 MED ORDER — FAMOTIDINE 20 MG PO TABS
20.0000 mg | ORAL_TABLET | Freq: Every evening | ORAL | Status: DC
Start: 2017-01-08 — End: 2017-01-26
  Administered 2017-01-09 – 2017-01-25 (×17): 20 mg via ORAL
  Filled 2017-01-08 (×17): qty 1

## 2017-01-08 MED ORDER — METOPROLOL TARTRATE 25 MG PO TABS
25.0000 mg | ORAL_TABLET | Freq: Two times a day (BID) | ORAL | Status: DC
Start: 2017-01-08 — End: 2017-01-18
  Administered 2017-01-09 – 2017-01-18 (×18): 25 mg via GASTROSTOMY
  Filled 2017-01-08 (×21): qty 1

## 2017-01-08 MED ORDER — RIVAROXABAN 20 MG PO TABS
20.0000 mg | ORAL_TABLET | Freq: Every day | ORAL | Status: DC
Start: 2017-01-08 — End: 2017-01-30
  Administered 2017-01-09 – 2017-01-29 (×21): 20 mg via GASTROSTOMY
  Filled 2017-01-08 (×23): qty 1

## 2017-01-08 MED ORDER — ACETAMINOPHEN 325 MG PO TABS
650.0000 mg | ORAL_TABLET | Freq: Four times a day (QID) | ORAL | Status: AC | PRN
Start: 2017-01-08 — End: 2017-01-09

## 2017-01-08 MED ORDER — SODIUM CHLORIDE 0.9 % IV MBP
500.0000 mg | Freq: Once | INTRAVENOUS | Status: AC
Start: 2017-01-08 — End: 2017-01-08
  Administered 2017-01-08: 500 mg via INTRAVENOUS
  Filled 2017-01-08: qty 0.5
  Filled 2017-01-08: qty 100

## 2017-01-08 MED ORDER — SODIUM CHLORIDE 0.9 % IV MBP
500.0000 mg | Freq: Three times a day (TID) | INTRAVENOUS | Status: DC
Start: 2017-01-08 — End: 2017-01-10
  Administered 2017-01-09 – 2017-01-10 (×4): 500 mg via INTRAVENOUS
  Filled 2017-01-08: qty 0.5
  Filled 2017-01-08 (×2): qty 100
  Filled 2017-01-08 (×2): qty 0.5
  Filled 2017-01-08: qty 100
  Filled 2017-01-08: qty 0.5
  Filled 2017-01-08: qty 100

## 2017-01-08 MED ORDER — VANCOMYCIN HCL 1000 MG IV SOLR
1500.0000 mg | INTRAVENOUS | Status: DC
Start: 2017-01-09 — End: 2017-01-09
  Administered 2017-01-09: 1500 mg via INTRAVENOUS
  Filled 2017-01-08: qty 1500

## 2017-01-08 MED ORDER — ONDANSETRON 4 MG PO TBDP
4.0000 mg | ORAL_TABLET | Freq: Four times a day (QID) | ORAL | Status: DC | PRN
Start: 2017-01-08 — End: 2017-01-30
  Administered 2017-01-18: 4 mg via ORAL
  Filled 2017-01-08: qty 1

## 2017-01-08 MED ORDER — ACETAMINOPHEN 325 MG PO TABS
650.0000 mg | ORAL_TABLET | ORAL | Status: DC | PRN
Start: 2017-01-08 — End: 2017-01-30
  Administered 2017-01-13: 650 mg via ORAL
  Filled 2017-01-08: qty 2

## 2017-01-08 MED ORDER — NALOXONE HCL 0.4 MG/ML IJ SOLN (WRAP)
0.2000 mg | INTRAMUSCULAR | Status: DC | PRN
Start: 2017-01-08 — End: 2017-01-30

## 2017-01-08 MED ORDER — AMLODIPINE BESYLATE 5 MG PO TABS
5.0000 mg | ORAL_TABLET | Freq: Every day | ORAL | Status: DC
Start: 2017-01-09 — End: 2017-01-19
  Administered 2017-01-10 – 2017-01-19 (×10): 5 mg via ORAL
  Filled 2017-01-08 (×11): qty 1

## 2017-01-08 MED ORDER — SODIUM CHLORIDE 0.9 % IV BOLUS
500.0000 mL | Freq: Once | INTRAVENOUS | Status: AC
Start: 2017-01-08 — End: 2017-01-08
  Administered 2017-01-08: 500 mL via INTRAVENOUS

## 2017-01-08 MED ORDER — DEXTROSE-SODIUM CHLORIDE 5-0.9 % IV SOLN
INTRAVENOUS | Status: DC
Start: 2017-01-08 — End: 2017-01-12

## 2017-01-08 MED ORDER — SENNA 8.6 MG PO TABS
17.2000 mg | ORAL_TABLET | Freq: Every day | ORAL | Status: DC | PRN
Start: 2017-01-08 — End: 2017-01-30
  Administered 2017-01-10 – 2017-01-28 (×2): 17.2 mg via GASTROSTOMY
  Filled 2017-01-08 (×2): qty 2

## 2017-01-08 MED ORDER — METOCLOPRAMIDE HCL 10 MG PO TABS
5.0000 mg | ORAL_TABLET | Freq: Three times a day (TID) | ORAL | Status: DC
Start: 2017-01-08 — End: 2017-01-30
  Administered 2017-01-09 – 2017-01-30 (×61): 5 mg via GASTROSTOMY
  Filled 2017-01-08 (×64): qty 1

## 2017-01-08 MED ORDER — ONDANSETRON HCL 4 MG/2ML IJ SOLN
4.0000 mg | Freq: Three times a day (TID) | INTRAMUSCULAR | Status: DC | PRN
Start: 2017-01-08 — End: 2017-01-08

## 2017-01-08 MED ORDER — BUMETANIDE 1 MG PO TABS
0.5000 mg | ORAL_TABLET | Freq: Every day | ORAL | Status: DC
Start: 2017-01-09 — End: 2017-01-18
  Administered 2017-01-10 – 2017-01-17 (×8): 0.5 mg via GASTROSTOMY
  Filled 2017-01-08 (×12): qty 1

## 2017-01-08 MED ORDER — TOBRAMYCIN SULFATE 1.2 GM/30ML IJ SOLN
2.5000 mg/kg | Freq: Once | INTRAVENOUS | Status: AC
Start: 2017-01-08 — End: 2017-01-08
  Administered 2017-01-08: 220 mg via INTRAVENOUS
  Filled 2017-01-08: qty 5.5

## 2017-01-08 NOTE — ED Notes (Signed)
Spoke with Dr Nilda Calamity covering for Dr [name]. Patient's g tube is not functional, leaks around insertion site when flushed. [name] was aware prior to sending patient to hospital and had planned for replacement. Patient cannot receive meds orally or G tube tonight.

## 2017-01-08 NOTE — Consults (Addendum)
Infectious Diseases and Tropical Medicine Consult  Ronald Child, [name]          Date Time: 01/08/17 6:24 PM  Patient Name: [name],[name]  Referring Physician: [name], *      Reason for Consultation:      Altered mental status    Assessment:      Altered mental status.   Possibility of UTI.   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   Status post tracheostomy andG-tube 6placement   Resident of [name]   Recently treated MDR organism pneumonia.   Penicillin allergy  .  Recommendations:      Start vancomycin and meropenem.   Pharmacy to monitor vancomycin levels   Await pending cultures   Pro-calcitonin level   Probiotics    Monitor electrolytes and renal functions closely   Monitor clinically                                                                   History of Present Illness:     [name] is an 83 year old male resident of [name] nursing [facility] with the history of a cervical fracture causing quadriparesis requiring tracheostomy and G-tube placement and history of multiple drug resistant organisms.  Pneumonia, recently discharged from the hospital is admitted because of worsening mental status.  Patient's urinalysis is consistent with urinary tract infection.  Urine blood and sputum cultures are ordered and are pending.  Chest x-ray showing improving vascular congestion.  Patient is started on vancomycin and tobramycin.  No seizure or syncopal episode.  Patient has a chronic back wound    Past Medical History:      Hypertension.   C2 cervical fracture.   Insomnia.    Past Surgical History:      Tracheostomy.   G-tube placement    Family History:      Noncontributory    Social History:      No smoking    No alcohol    Allergies:      Dilaudid    Review of Systems:     General:  Comfortable, no acute distress, low-grade fever, no chills.  6 tracheostomy tube in  place  Respiratory: no cough or shortness of breath, no hematemesis or hemoptysis  Cardiac: no chest pain  Abdomen: no abdominal pain, no nausea vomiting or diarrhea, G-tube in place  Neurologic: awake and alert  Extremities: no joint pains or swelling  Genitourinary: no hematuria  Dermatologic: no skin rashes, no itching    Physical Exam:     Blood pressure 109/55, pulse (!) 124, temperature 97.3 F (36.3 C), temperature source Oral, resp. rate 21, weight 109 kg (240 lb 4.8 oz), SpO2 98 %.    General Appearance: Comfortable, well-appearing and in no acute distress.    HEENT:  Head is normocephalic, atraumatic, pupils are equal and reactive to light  Neck:    Supple,No neck lymphadenopathy, no thyromegaly, tracheostomy in place  Lungs:  Decreased breath sounds  Chest Wall: Symmetric chest wall expansion.   Heart : Regular rate and rhythm, no murmur or gallop  Abdomen: Abdomen is soft, nontender, good bowel sounds, no hepatosplenomegaly, G-tube in place  Neurological: Awake and alert  Extremities: No edema, no clubbing or cyanosis  Skin:  Warm and dry.No rash or ecchymosis.   Psychiatric: Mood and affect is normal    Labs:     Recent Labs      01/08/17   1029   WBC  26.48*   Hgb  10.5*   Hematocrit  34.7*   Platelets  349   MCV  88.3       Recent Labs      01/08/17   1029   Sodium  140   Potassium  4.4   Chloride  101   CO2  30*   BUN  32.0*   Creatinine  0.8   Glucose  179*   Calcium  9.1       Recent Labs      01/08/17   1029   AST (SGOT)  24   ALT  24   Alkaline Phosphatase  67   Protein, Total  6.1   Albumin  2.5*   Bilirubin, Total  0.4         Imaging studies:           Thanks for the consultation          Signed by: Ronald Child, [name]  Date Time: 01/08/17 6:24 PM      *This note was generated by the Epic EMR system/ Dragon speech recognition and may contain inherent errors or omissions not intended by the user. Grammatical errors, random word insertions, deletions, pronoun errors and incomplete sentences  are occasional consequences of this technology due to software limitations. Not all errors are caught or corrected. If there are questions or concerns about the content of this note or information contained within the body of this dictation they should be addressed directly with the author for clarification

## 2017-01-08 NOTE — ED Notes (Signed)
Bed: CRB  Expected date: 01/08/17  Expected time: 9:51 AM  Means of arrival: [id number]  Comments:  [facility]

## 2017-01-08 NOTE — ED Triage Notes (Signed)
Pt BIBA from [facility] and is in no acute distress.  Pt staff states pt was normal mentation communicating with writting at 0900 and at 0915 was altered and not able to respond purposefully.Pt staff also noted the G-tube was leaking

## 2017-01-08 NOTE — H&P (Signed)
ADMISSION HISTORY AND PHYSICAL EXAM    Date Time: 01/08/17 2:50 PM  Patient Name: Ronald,WJXB Cook  Attending Physician: [name], *        History of Present Illness:   [name] is a 83 y.o. male Resident of  [name] had remote cervical fracture with Quadriparesis , trach and vented and G tube  Feeding was recently discharged from [facility] after being treated for multiple MDR organism from sputium and Pneumonia brought  from [facility] for altered mental status on the day of admission    On arrival to emergency room, since is awake, hemodynamically stable, temperature of 97 saturating 100 percent.  Initial workup was with chest x-ray and was read as interval improvement of vascular congestion and pulmonary edema and decreased in size of pleural effusion.CTH read as no acute intracranial abnormality. Blood work up was significant for Leucocytosis at 26,000 with shift to the left.  Anemia with hematocrit of 34 percent, elevated lactic acid at 2.2 with negative troponin.  Urine is dirty with too numerous white blood cells and posetive  leukocyte esterase  admitted to the floor with sepsis.  ID, Dr. Janalyn Rouse was  consulted from the emergency room    Past Medical History:     Past Medical History:   Diagnosis Date   . C2 cervical fracture    . Chronic pain    . Conjunctivitis    . Constipation    . Cutaneous abscess of buttock    . Dysphagia    . Hypertension    . Insomnia    . Kidney disorder    . Respiratory arrest        Past Surgical History:     Past Surgical History:   Procedure Laterality Date   . GASTROSTOMY TUBE PLACEMENT     . TRACHEOSTOMY         Family History:   No family history on file.    Social History:     Social History     Social History   . Marital status: Single     Spouse name: N/A   . Number of children: N/A   . Years of education: N/A     Social History Main Topics   . Smoking status: Never Smoker   . Smokeless tobacco: Never Used   . Alcohol use No    . Drug use: No   . Sexual activity: Not on file     Other Topics Concern   . Not on file     Social History Narrative   . No narrative on file       Allergies:     Allergies   Allergen Reactions   . Dilaudid [Hydromorphone Hcl]        Medications:     (Not in a hospital admission)    Review of Systems:   Uncontributory except mentioned in subjective    Physical Exam:   BP 131/66   Pulse 92   Temp 97.3 F (36.3 C) (Oral)   Resp 18   Wt 109 kg (240 lb 4.8 oz)   SpO2 100%   BMI 34.48 kg/m       General appearance - Awake /trached and ventilated /on G-tube feeding Eyes - pupils equal and reactive, extraocular eye movements intact  Ears - bilateral TM's and external ear canals normal  Nose - normal and patent, no erythema, discharge or polyps  Mouth - mucous membranes moist, pharynx normal without lesions  Neck - supple, no significant adenopathy  Lymphatics - no palpable lymphadenopathy  Chest - decreased air entry at the bases of the lung, bilateral  Heart -irregular  Abdomen - G-tube is intact  , soft abdomen   Back exam - full range of motion, no tenderness, palpable spasm or pain on motion  Neurological - awake quadriparesis  Musculoskeletal - no joint tenderness, deformity or swelling  Extremities - peripheral pulses normal, no pedal edema, no clubbing or cyanosis  Skin - normal coloration and turgor, no rashes, no suspicious skin lesions noted    Labs:       Recent Labs  Lab 01/08/17  1029   WBC 26.48*   Hgb 10.5*   Hematocrit 34.7*   Platelets 349       Recent Labs  Lab 01/08/17  1029   Sodium 140   Potassium 4.4   Chloride 101   CO2 30*   BUN 32.0*   Creatinine 0.8   Calcium 9.1   Albumin 2.5*   Protein, Total 6.1   Bilirubin, Total 0.4   Alkaline Phosphatase 67   ALT 24   AST (SGOT) 24   Glucose 179*       Recent Labs  Lab 01/08/17  1029   Troponin I 0.03       Microbiology Results     Procedure Component Value Units Date/Time    Blood Culture Aerobic/Anaerobic #1 [[id number]] Collected:  01/08/17 1029     Specimen:  Arm from Blood Updated:  01/08/17 1029    Narrative:       1 BLUE+1 PURPLE    Blood Culture Aerobic/Anaerobic #2 [[id number]] Collected:  01/08/17 1029    Specimen:  Arm from Blood Updated:  01/08/17 1029    Narrative:       1 BLUE+1 PURPLE    MRSA Culture [[id number]] Collected:  01/08/17 1029    Specimen:  Body Fluid from Nares and Throat Updated:  01/08/17 1029    Sputum Culture [[id number]] Collected:  01/08/17 1129    Specimen:  Sputum from Tracheal Aspirate Updated:  01/08/17 1129    Urine culture [[id number]] Collected:  01/08/17 1129    Specimen:  Urine from Urine, Catheterized, In & Out Updated:  01/08/17 1129          Rads:     Ct Head Wo Contrast    Result Date: 01/08/2017    No acute intracranial abnormality. Wyatt Portela, MD 01/08/2017 11:16 AM    Xr Chest  Ap Portable    Result Date: 01/08/2017    Interval improvement of the vascular congestion and pulmonary edema and decrease in size of the pleural effusions. Heron Nay, MD 01/08/2017 11:29 AM      Assessment :    Sepsis   Acute on chronic respiratory failure/trached and ventilated   Urinary tract infection   Pneumonia   Encephalopathy, metabolic   Remote history of cervical vertebral fracture with quadriparesis   G-tube feeding   Chronic atrial fibrillation on anticoagulation   Hypertension    Plan  :    Admit to telemetry bed   Trach and vent care   Oxygenation   Antibiotics per ID recommendation   Follow culture and sensitivity results   Gastrointestinal and deep vein thrombosis prophylaxis   Continue home medications   ID consulted   Subsequent recommendation to follow      Hershal Coria, MD  [id number]  [facility] clinic  [phone]  01/08/2017  2:50 PM

## 2017-01-08 NOTE — Progress Notes (Signed)
TCM Medicare Navigator    Patient currently admitted to [facility] under inpatient status. Medicare Focus Navigator will continue to monitor patient status for 30 days following index admission which occurred dates 12/14/2016 to 12/27/2016.      Garrell Flagg BS   TCM Medicare Focus navigator   [phone]

## 2017-01-08 NOTE — Consults (Signed)
Pharmacy Note: INITIAL Vancomycin Dosing   Vancomycin Dosing Guidelines  Vancomycin Monitoring Protocol    Height Weight Renal Function     109 kg (240 lb 4.8 oz) Serum creatinine: 0.8 mg/dL 16/10/96 0454  Estimated creatinine clearance: 89.5 mL/min     99.7 F (37.6 C) (Rectal)   Recent Labs     Lab 01/08/17  1029 01/04/17  0540   WBC 26.48* 11.30*   Creatinine 0.8 0.7   Pharmacy was consulted on 01/08/17 by Dr. Janalyn Rouse to dose vancomycin.  Vancomycin Indication: Sepsis  Goal Trough: 15-20 mcg/mL    Indication Goal Trough   UTI / Mild Cellulitis 10-15 mcg/mL   All Other Indications 15-20 mcg/mL   Pre-HD 15-25 mcg/mL     Other Nephrotoxic Agents/Conditions:  Advanced Age (>65Y), Critical Illness/Hypotension, Loop Diuretics  Did patient receive intravenous contrast within the last 72 hours? No    Has patient received vancomycin previously in this visit?  No  If so, please take previous regimen into consideration when determining the new regimen.    Date ED Dose   1/30 1/31 2/1      Administered  Dose (time) 1750mg  @ 1305  1500mg  @       Level (time)   T @ 0930        Recommendations:   Initial loading dose (~20-25 mg/kg Per Protocol) = 1750 mg already given in ED  Maintenance regimen (~15 mg/kg Per Protocol) = 1500mg  q24hr starting at 1000 on 1/31 to account for lower than recommended loading dose.  Trough before third dose at 0930 on 2/1.    Continue to monitor renal function, WBC, temp, microbiologic data, missed doses, and signs/symptoms of adverse reaction.    Thank you for this consult. Pharmacy will continue to follow this patient's progress with you until consult and/or vancomycin is discontinued.    Iline Oven Keylie Beavers, R.Ph.  Phone: [phone]

## 2017-01-08 NOTE — ED Provider Notes (Signed)
EMERGENCY DEPARTMENT HISTORY AND PHYSICAL EXAM    Date: 01/08/2017  Patient Name: [name]  Attending Physician:  Kelly Splinter, MD  Diagnosis and Treatment Plan       Clinical Impression:   1. Altered mental status, unspecified altered mental status type    2. Sepsis, due to unspecified organism        Treatment Plan:   ED Disposition     ED Disposition Condition Date/Time Comment    Admit  Tue Jan 08, 2017 12:33 PM Admitting Physician: [name], [name] [[id number]]   Diagnosis: Altered mental status [780.97.ICD-9-CM]   Estimated Length of Stay: > or = to 2 midnights   Tentative Discharge Plan?: Home or Self Care [1]   Patient Class: Inpatient [101]   Bed request comments: Contact isolation            History of Presenting Illness     Chief Complaint   Patient presents with   . Altered Mental Status     Unable to obtain complete HPI, PMFSH, and ROS due to AMS.    History Provided By: Patient and EMS  Chief Complaint: Altered Mental Status    Additional History: [name] is a 83 y.o. male presenting to the ED BIBA from [facility] with AMS starting this morning. Pt's last known normal was 1 hour ago, and he was altered 45 minutes ago. Per EMS, pt became confused and less responsive at that time. Pt was at [facility] for respirator failure, but is currently breathing with assistance. Per EMS, pt is not febrile. Pt denies any headache, SOB, abdominal pain, or diarrhea.       PCP: Ronald [name], MD      Current Facility-Administered Medications:   .  acetaminophen (TYLENOL) tablet 650 mg, 650 mg, Oral, Q4H PRN **OR** acetaminophen (TYLENOL) suppository 650 mg, 650 mg, Rectal, Q4H PRN, Cook, Ronald B, MD  .  acetaminophen (TYLENOL) tablet 650 mg, 650 mg, per G tube, Q6H PRN, Ronald Foots, MD  .  albuterol-ipratropium (DUO-NEB) 2.5-0.5(3) mg/3 mL nebulizer 3 mL, 3 mL, Nebulization, Q4H SCH, Cook, Ronald B, MD  .  amiodarone (PACERONE) tablet 200 mg, 200 mg, Oral, Daily, Cook, Ronald B,  MD  .  amLODIPine (NORVASC) tablet 5 mg, 5 mg, Oral, Daily, Cook, Ronald B, MD  .  atorvastatin (LIPITOR) tablet 20 mg, 20 mg, per G tube, QHS, Cook, Ronald B, MD  .  bumetanide (BUMEX) tablet 0.5 mg, 0.5 mg, per G tube, Daily, Cook, Ronald B, MD  .  dextrose  5 % and 0.9 % NaCl infusion, , Intravenous, Continuous, Cook, Ronald B, MD  .  famotidine (PEPCID) tablet 20 mg, 20 mg, Oral, QHS, Cook, Ronald B, MD  .  Melene Muller ON 01/09/2017] glycopyrrolate (ROBINUL) tablet 1 mg, 1 mg, per G tube, Daily, Cook, Ronald B, MD  .  metoclopramide (REGLAN) tablet 5 mg, 5 mg, per G tube, Q8H, Cook, Ronald B, MD  .  metoprolol tartrate (LOPRESSOR) tablet 25 mg, 25 mg, per G tube, BID, Cook, Ronald B, MD  .  naloxone (NARCAN) injection 0.2 mg, 0.2 mg, Intravenous, PRN, Cook, Ronald B, MD  .  ondansetron (ZOFRAN-ODT) disintegrating tablet 4 mg, 4 mg, Oral, Q6H PRN **OR** ondansetron (ZOFRAN) injection 4 mg, 4 mg, Intravenous, Q6H PRN, Cook, Ronald B, MD  .  oxyCODONE-acetaminophen (PERCOCET) 5-325 MG per tablet 1 tablet, 1 tablet, per G tube, Q6H PRN, [name], Ronald B, MD  .  rivaroxaban (XARELTO) tablet 20 mg, 20 mg, per G tube, Daily with dinner, Cook, Ronald B, MD  .  scopolamine (TRANSDERM-SCOP) 1.5 mg 1 patch, 1 patch, Transdermal, Q72H, Cook, Ronald B, MD  .  senna (SENOKOT) tablet 17.2 mg, 17.2 mg, per G tube, QD PRN, Cook, Ronald B, MD    Current Outpatient Prescriptions:   .  albuterol-ipratropium (DUO-NEB) 2.5-0.5(3) mg/3 mL nebulizer, Take 3 mLs by nebulization 4 (four) times daily.In addition to every 4 hours as needed, Disp: , Rfl:   .  amiodarone (PACERONE) 200 MG tablet, Place 1 tablet (200 mg total) into feeding tube daily., Disp: , Rfl:   .  amLODIPine (NORVASC) 5 MG tablet, TAKE ONE TABLET BY gastric tube EVERY DAY, Disp: , Rfl:   .  Ascorbic Acid (VITAMIN C) 500 MG tablet, 500 mg by per G tube route 2 (two) times daily.  , Disp: , Rfl:   .  atorvastatin (LIPITOR) 20 MG tablet,  20 mg by per G tube route nightly.  , Disp: , Rfl:   .  bumetanide (BUMEX) 0.5 MG tablet, 0.5 mg by per G tube route daily., Disp: , Rfl:   .  chlorhexidine (PERIDEX) 0.12 % solution, Use as directed 15 mLs in the mouth or throat 2 (two) times daily., Disp: , Rfl:   .  furosemide (LASIX) 40 MG tablet, 40 mg by per G tube route daily., Disp: , Rfl:   .  glycopyrrolate (ROBINUL) 1 MG tablet, 1 mg by per G tube route daily., Disp: , Rfl:   .  metoclopramide (REGLAN) 5 MG tablet, 5 mg by per G tube route every 8 (eight) hours., Disp: , Rfl:   .  metoprolol tartrate (LOPRESSOR) 25 MG tablet, 25 mg by per G tube route 2 (two) times daily., Disp: , Rfl:   .  Multiple Vitamin (TAB-A-VITE) Tab, 1 tablet daily.Via g tube, Disp: , Rfl:   .  oxyCODONE-acetaminophen (PERCOCET) 5-325 MG per tablet, 1 tablet by per G tube route every 6 (six) hours as needed for Pain., Disp: , Rfl:   .  potassium chloride 20 MEQ/15ML (10%) oral solution, 10 mEq by per G tube route daily., Disp: , Rfl:   .  rivaroxaban (XARELTO) 20 MG Tab, 20 mg by per G tube route daily with dinner., Disp: , Rfl:   .  scopolamine (TRANSDERM-SCOP) 1.5 mg, Place 1 patch onto the skin every third day., Disp: , Rfl:   .  senna (SENOKOT) 8.6 MG tablet, 2 tablets by per G tube route daily as needed for Constipation., Disp: , Rfl:   .  sodium hypochlorite (Cook'S HALF-STRENGTH) external solution, Apply topically daily., Disp: , Rfl:   .  famotidine (PEPCID) 20 MG tablet, Take 20 mg by mouth nightly., Disp: , Rfl:     Past Medical History     Past Medical History:   Diagnosis Date   . C2 cervical fracture    . Chronic pain    . Conjunctivitis    . Constipation    . Cutaneous abscess of buttock    . Dysphagia    . Hypertension    . Insomnia    . Kidney disorder    . Respiratory arrest      Past Surgical History:   Procedure Laterality Date   . GASTROSTOMY TUBE PLACEMENT     . TRACHEOSTOMY         Family History     No family history on file.  Social History     Social  History     Social History   . Marital status: Single     Spouse name: N/A   . Number of children: N/A   . Years of education: N/A     Social History Main Topics   . Smoking status: Never Smoker   . Smokeless tobacco: Never Used   . Alcohol use No   . Drug use: No   . Sexual activity: Not on file     Other Topics Concern   . Not on file     Social History Narrative   . No narrative on file       Allergies     Allergies   Allergen Reactions   . Dilaudid [Hydromorphone Hcl]        Review of Systems     Review of Systems   Unable to perform ROS: Mental status change   Constitutional: Negative for fever.   Respiratory: Negative for shortness of breath.    Gastrointestinal: Negative for abdominal pain and diarrhea.   Neurological: Negative for headaches.   Psychiatric/Behavioral: Positive for confusion.   did consult patient and ems and [facility] records    Physical Exam     BP 115/83   Pulse (!) 120   Temp 99.7 F (37.6 C) (Rectal)   Resp (!) 25   Wt 109 kg   SpO2 99%   BMI 34.48 kg/m   Pulse Oximetry Analysis - Normal 100% On RA    Physical Exam   Constitutional: He appears well-developed and well-nourished.   HENT:   Head: Normocephalic and atraumatic.   Dry mmm, asymmetric mouth.     Eyes: Conjunctivae are normal. Pupils are equal, round, and reactive to light. No scleral icterus.   Neck: Normal range of motion. Neck supple.   Cardiovascular: Normal rate, regular rhythm and intact distal pulses.    Pulmonary/Chest: Effort normal. No stridor. No respiratory distress. He has no wheezes. He has no rales.   Abdominal: Soft. He exhibits no distension. There is no tenderness. There is no rebound and no guarding.   Musculoskeletal: Normal range of motion. He exhibits no edema.   Neurological:   Will respond to voice and indicate yes and no (while boarding in the ed he started being more communicative with his right hand indicating he wanted to go back to [place]); no movement and increased tone left, some squeeze attempt  on the right and movement of the muscles when asked to move his right lower ext   Skin: Skin is warm and dry.   Psychiatric: He has a normal mood and affect.   Nursing note and vitals reviewed.    Diagnostic Study Results     Labs -     Results     Procedure Component Value Units Date/Time    Troponin I [[id number]] Collected:  01/08/17 1910    Specimen:  Blood Updated:  01/08/17 1914    Lactic acid [[id number]]  (Abnormal) Collected:  01/08/17 1023    Specimen:  Blood Updated:  01/08/17 1832     Lactic acid 3.2 (H) mmol/L     Sputum Culture [[id number]] Collected:  01/08/17 1129    Specimen:  Sputum from Tracheal Aspirate Updated:  01/08/17 1723    Narrative:       ORDER#: [id number]  ORDERED BY: Santina Trillo, KATHERIN  SOURCE: Tracheal Aspirate trach                      COLLECTED:  01/08/17 11:29  ANTIBIOTICS AT COLL.:                                RECEIVED :  01/08/17 16:01  Stain, Gram (Respiratory)                  FINAL       01/08/17 17:23  01/08/17   Few WBC's             Rare WBC's             Moderate Mixed Respiratory Flora  Culture and Gram Stain, Aerobic, RespiratorPENDING      MRSA Culture [[id number]] Collected:  01/08/17 1029    Specimen:  Body Fluid from Nares and Throat Updated:  01/08/17 1548    Urine culture [[id number]] Collected:  01/08/17 1129    Specimen:  Urine from Urine, Catheterized, In & Out Updated:  01/08/17 1548    Blood Culture Aerobic/Anaerobic #1 [[id number]] Collected:  01/08/17 1029    Specimen:  Arm from Blood Updated:  01/08/17 1523    Narrative:       1 BLUE+1 PURPLE    Blood Culture Aerobic/Anaerobic #2 [[id number]] Collected:  01/08/17 1029    Specimen:  Arm from Blood Updated:  01/08/17 1523    Narrative:       1 BLUE+1 PURPLE    Lactic acid [[id number]]  (Abnormal) Collected:  01/08/17 1129    Specimen:  Blood Updated:  01/08/17 1404     Lactic acid 2.2 (H) mmol/L     UA, Reflex to Microscopic (pts 3 + yrs) [[id number]]  (Abnormal) Collected:  01/08/17  1129    Specimen:  Urine Updated:  01/08/17 1225     Urine Type Clean Catch     Color, UA Amber (A)     Clarity, UA Sl Cloudy (A)     Specific Gravity UA 1.017     Urine pH 6.0     Leukocyte Esterase, UA Large (A)     Nitrite, UA Negative     Protein, UR 100 (A)     Glucose, UA 50 (A)     Ketones UA Negative     Urobilinogen, UA Negative mg/dL      Bilirubin, UA Negative     Blood, UA Large (A)     RBC, UA TNTC (A) /hpf      WBC, UA TNTC (A) /hpf      Squamous Epithelial Cells, Urine 0 - 5 /hpf     Troponin I [[id number]] Collected:  01/08/17 1029    Specimen:  Blood Updated:  01/08/17 1102     Troponin I 0.03 ng/mL     Glucose Whole Blood - POCT [[id number]]  (Abnormal) Collected:  01/08/17 1059     Updated:  01/08/17 1102     POCT - Glucose Whole blood 140 (H) mg/dL     Comprehensive metabolic panel [[id number]]  (Abnormal) Collected:  01/08/17 1029    Specimen:  Blood Updated:  01/08/17 1056     Glucose 179 (H) mg/dL      BUN 18.8 (H) mg/dL      Creatinine 0.8 mg/dL      Sodium 416 mEq/L      Potassium 4.4 mEq/L  Chloride 101 mEq/L      CO2 30 (H) mEq/L      Calcium 9.1 mg/dL      Protein, Total 6.1 g/dL      Albumin 2.5 (L) g/dL      AST (SGOT) 24 U/L      ALT 24 U/L      Alkaline Phosphatase 67 U/L      Bilirubin, Total 0.4 mg/dL      Globulin 3.6 g/dL      Albumin/Globulin Ratio 0.7 (L)     Anion Gap 9.0    Hemolysis index [[id number]] Collected:  01/08/17 1029     Updated:  01/08/17 1056     Hemolysis Index 0    GFR [[id number]] Collected:  01/08/17 1029     Updated:  01/08/17 1056     EGFR >60.0    CBC with differential [[id number]]  (Abnormal) Collected:  01/08/17 1029    Specimen:  Blood from Blood Updated:  01/08/17 1042     WBC 26.48 (H) x10 3/uL      Hgb 10.5 (L) g/dL      Hematocrit 56.3 (L) %      Platelets 349 x10 3/uL      RBC 3.93 (L) x10 6/uL      MCV 88.3 fL      MCH 26.7 (L) pg      MCHC 30.3 (L) g/dL      RDW 16 (H) %      MPV 10.5 fL      Neutrophils 91.5 %      Lymphocytes Automated 1.5 %       Monocytes 3.4 %      Eosinophils Automated 0.3 %      Basophils Automated 0.2 %      Immature Granulocyte 3.1 %      Nucleated RBC 0.0 /100 WBC      Neutrophils Absolute 24.20 (H) x10 3/uL      Abs Lymph Automated 0.41 (L) x10 3/uL      Abs Mono Automated 0.91 x10 3/uL      Abs Eos Automated 0.08 x10 3/uL      Absolute Baso Automated 0.05 x10 3/uL      Absolute Immature Granulocyte 0.83 (H) x10 3/uL      Absolute NRBC 0.00 x10 3/uL           Radiologic Studies -   Radiology Results (24 Hour)     Procedure Component Value Units Date/Time    XR Chest  AP Portable [[id number]] Collected:  01/08/17 1127    Order Status:  Completed Updated:  01/08/17 1133    Narrative:       XR CHEST AP PORTABLE  CLINICAL INDICATION: ams, vent dependent    COMPARISON: 12/25/2016    FINDINGS:   A single portable AP view of the chest was obtained. The  right PICC line catheter has been removed. Bilateral pleural effusions  again noted with some interval improvement. The heart is enlarged and  stable.. There has been interval improvement of the vascular congestion  and pulmonary edema.      Impression:         Interval improvement of the vascular congestion and  pulmonary edema and decrease in size of the pleural effusions.     Heron Nay, MD   01/08/2017 11:29 AM    CT Head WO Contrast [[id number]] Collected:  01/08/17 1115    Order Status:  Completed Updated:  01/08/17 1121  Narrative:       CLINICAL INDICATION:  Confusion, altered mental status    TECHNIQUE:  Axial noncontrast CT images were obtained from the skull  base to the vertex. A combination of a automatic exposure control,  adjustment of the mA and/or kV  according to patient size and/or use of  iterative reconstruction technique was utilized.      COMPARISON:  None available    INTERPRETATION: There is mild cerebral volume loss. There is no acute  intracranial hemorrhage or extra axial fluid collection. Ventricles have  a normal configuration. There is no midline shift or  mass effect. There  is old left lacunar infarct. There is periventricular low-attenuation,  consistent with chronic small vessel ischemic disease. Gray-white matter  differentiation is normal.    The visualized paranasal sinuses are clear.      Impression:         No acute intracranial abnormality.    Wyatt Portela, MD   01/08/2017 11:16 AM      .12/29/2016 12:15 PM - Interface, Lab In [place]     Narrative     fax [phone]    01-19ev, by [id number] on 12/28/2016 at 14:14  ORDER#: [id number]                  ORDERED BY: Venetia Constable, ERIC  SOURCE: Sputum, Suctioned trach           COLLECTED: 12/24/16 11:54  ANTIBIOTICS AT COLL.:                RECEIVED : 12/24/16 13:57  fax [phone]    01-19ev, by [id number] on 12/28/2016 at 14:14  Stain, Gram (Respiratory)         FINAL    12/24/16 15:57  12/24/16  Few Mixed Respiratory Flora       No Squamous epithelial cells       No WBC's  Culture and Gram Stain, Aerobic, RespiratorFINAL    12/29/16 12:15  +  12/25/16  Moderate growth of mixed upper respiratory flora  12/27/16  Heavy growth of Pseudomonas aeruginosa      12/29/16  Heavy growth of MDR Pseudomonas aeruginosa         2nd morphotype       Multidrug resistant (MDR) organism. This is a highly       resistant organism, follow Infection Control guidelines.    12/28/16  Heavy growth of MDR Providencia stuartii         Morganella, Serratia, Providencia, Hafnia, Citrobacter, and       Enterobacter species may develop resistance while on therapy       with B-lactam antibiotics, even if initially susceptible, due       to derepression of AmpC B-lactamase. CLSI M100-S24,       [place] [facility] Antimicrobial Subcommittee June 2015       This multidrug resistant (MDR) Enterobacteriaceae is resistant       to ceftriaxone and may not respond optimally to B-lactam       antibiotics (excluding  carbapenems).       [place] [facility] Antimicrobial Subcommittee June 2015    _____________________________________________________________________________                 P.aeruginosa MDR P.aeruginosa MDR P.stuartii   ANTIBIOTICS           MIC INTRP   MIC INTRP   MIC INTRP    _____________________________________________________________________________  Amikacin            <=  8  S    <=8  S    <=8  S     Amoxicillin/CA                         >16/8  R     Ampicillin                           >16  R     Aztreonam            4   S    >16  R    >16  R     Cefazolin                            >16  R     Cefepime             2   S    16   R    <=1  R     Ceftazidime           <=2  S    16   I    >16  R     Ceftriaxone                           >32  R     Ciprofloxacin          2   I     2   I    >2   R     Ertapenem                           <=0.25  S     Gentamicin           <=2  S     4   S             Levofloxacin           4   I     4   I    >4   R     Meropenem            4   I     4   I   <=0.25  S     Piperacillin/Tazobactam     4/4  S    32/4  I    4/4  S     Tetracycline                          >8   R     Tobramycin           <=2  S    <=2  S             Trimethoprim/Sulfamethoxazole                 >2/38  R     _____________________________________________________________________________      S=SUSCEPTIBLE   I=INTERMEDIATE   R=RESISTANT              N/S=NON-SUSCEPTIBLE      12/16/2016 10:25 AM - Interface, Lab In [place]     Narrative     ORDER#: [id number]  ORDERED BY: NITZBERG, MICHA  SOURCE: Urine, Catheterized, In & Out        COLLECTED: 12/14/16 13:15  ANTIBIOTICS AT COLL.:                RECEIVED : 12/14/16 17:39  Culture Urine               FINAL    12/16/16 10:25  +  12/16/16  50,000 - 70,000 CFU/ML Pseudomonas aeruginosa      _____________________________________________________________________________                 P.aeruginosa   ANTIBIOTICS           MIC INTRP    _____________________________________________________________________________  Amikacin            <=8  S     Aztreonam            8   S     Cefepime             4   S     Ceftazidime           4   S     Ciprofloxacin         <=0.5  S     Gentamicin           <=2  S     Levofloxacin          <=1  S     Meropenem           <=0.25  S     Piperacillin/Tazobactam     8/4  S     Tobramycin           <=2  S     _____________________________________________________________________________      S=SUSCEPTIBLE   I=INTERMEDIATE   R=RESISTANT              N/S=NON-SUSCEPTIBLE     12/17/2016 2:59 PM - Interface, Lab In [place]     Narrative     ORDER#: [id number]                  ORDERED BY: NITZBERG, MICHA  SOURCE: Tracheal Aspirate trach           COLLECTED: 12/14/16 12:06  ANTIBIOTICS AT COLL.:                RECEIVED : 12/14/16 16:16  Stain, Gram (Respiratory)         FINAL    12/14/16 22:22  +  12/14/16  Few Squamous epithelial cells       Few WBC's       Moderate Mixed Respiratory Flora  Culture and Gram Stain, Aerobic, RespiratorFINAL    12/17/16 14:59  +  12/15/16   Light growth of mixed upper respiratory flora  12/17/16  Heavy growth of Providencia stuartii         Morganella, Serratia, Providencia, Hafnia, Citrobacter, and       Enterobacter species may develop resistance while on therapy       with B-lactam antibiotics, even if initially susceptible, due       to derepression of AmpC B-lactamase. CLSI W295-A21,       [place] [facility] Antimicrobial Subcommittee June 2015    12/17/16  Heavy growth of Pseudomonas aeruginosa      _____________________________________________________________________________                  P.stuartii   P.aeruginosa   ANTIBIOTICS  MIC INTRP   MIC INTRP    _____________________________________________________________________________  Amikacin            <=8  S    <=8  S     Amoxicillin/CA         >16/8  R             Ampicillin           >16  R             Aztreonam            <=2  S     4   S     Cefazolin            >16  R             Cefepime            <=1  S     2   S     Ceftazidime           <=2  S    <=2  S     Ceftriaxone           <=1  S             Ciprofloxacin          >2   R     2   I     Ertapenem           <=0.25  S             Gentamicin                   <=2  S     Levofloxacin          >4   R     4   I     Meropenem           <=0.25  S     4   I     Piperacillin/Tazobactam    16/4  S    4/4  S     Tetracycline          >8   R             Tobramycin                   <=2  S     Trimethoprim/Sulfamethoxazole <=0.5/9 S              _____________________________________________________________________________      S=SUSCEPTIBLE   I=INTERMEDIATE   R=RESISTANT              N/S=NON-SUSCEPTIBLE         Doctor's Notes     Throughout the stay in the Emergency Department, questions and concerns surrounding pain control, care plans, diagnostic studies, effects of medications administered or prescribed, and future prognostic dilemmas were assessed and addressed.    ROS addendum: The patient and/or family was asked if they had any other complaints or concerns that we could address today and nothing of significance was noted.     VITALS:  Patient Vitals for the past 12 hrs:   BP Temp Pulse Resp   01/08/17 1900 115/83 - (!) 120 (!) 25   01/08/17 1830 106/54 - (!) 114 19   01/08/17 1731 109/55 - (!) 124 21   01/08/17 1600 105/60 - (!) 131 (!) 23   01/08/17 1530 - 99.7 F (37.6 C) - -  01/08/17 1506 123/67 - (!) 120 18   01/08/17 1430 (!) 152/98 - (!) 138 (!) 32   01/08/17 1400 139/72 - 99 18   01/08/17 1330 131/66 - 92 18   01/08/17 1300 124/74 - (!) 104 18   01/08/17 1230 115/53 - 87 18   01/08/17 1200 111/55 - 86 18   01/08/17 1130 111/59 - 82 18   01/08/17 1100 105/59 - 92 18   01/08/17 1030 98/55 - 92 18   01/08/17 1021 91/56 97.3 F (36.3 C) (!) 112 18       OLD RECORDS:   Per pt's paperwork, pt's peg tube site is leaking. Pt is followed by Dr. [name]. His last XR was on 1/19. Pt had lab work performed on 1/29. At that time, he had BUN 29, creatinine 0.7 sugar 114, potassium 3.9, white count 11.3, H&H 9.8/31.5. Platelets 313. Pt is full code. He is on Xarelto and potassium supplements. In December pt was on Keflex for elevated WBC, subsequent to that pt was diagnosed with bronchitis and given Augmentin. He completed his regimen. He is on Lasix. Pt is frequently given Albuterol. He has a history of spine fractures and a CVA.     EKG: afib, rate 99, qtc 503, no stemi/st depressions, no pathologic twi    IMP & PLAN: ams  this am, improving a little but not at baseline  We will do stat ct imaging but also encephalopathy screening with concern for electrolytes and possible infection    ED COURSE:   10:24 AM - Per RT, pt's lactic acid is 3.2. Antibiotics given for suspected infection.     11:57 AM - Per RT, pt's lactic acid is 2.2 at this time.     12:25 PM - Discussed pt case with Dr. Griffin Cook, internal medicine, who agrees on behalf of Dr. [name] to take pt for hospitalization and requests that ID be consulted.    1:14 PM - Discussed pt case with Dr. Brendolyn Patty, infectious disease, who is okay with pt's initial abx plan and agrees to consult.    CRITICAL CARE: The high probability of sudden, clinically significant deterioration in the patient's condition required the highest level of my preparedness to intervene urgently.    The services I provided to this patient were to treat and/or prevent clinically significant deterioration that could result in: death or permanent disability.  Services included the following: chart data review, reviewing nursing notes and/or old charts, documentation time, consultant collaboration regarding findings and treatment options, medication orders and management, direct patient care, re-evaluations, vital sign assessments and ordering, interpreting and reviewing diagnostic studies/lab tests.    Aggregate critical care time was 40 minutes, which includes only time during which I was engaged in work directly related to the patient's care, as described above, whether at the bedside or elsewhere in the Emergency Department.  It did not include time spent performing other reported procedures or the services of residents, students, nurses or physician assistants.    _______________________________  Medical DeMedical Decision Makingcision Making  Attestations:     Physician/Midlevel provider first contact with patient: 01/08/17 1007         This note is prepared by Lessie Dings, acting as Scribe for  Kelly Splinter, MD.    Kelly Splinter, MD:  The scribe's documentation has been prepared under my direction and personally reviewed by me in its entirety.  I confirm that the note above accurately reflects all work, treatment, procedures, and medical  decision making performed by me.    I am the first provider for this patient.    Kelly Splinter, MD is the primary emergency doctor of record.    I reviewed the vital signs, available nursing notes, past medical history, past surgical history, family history and social history.    _______________________________           Ronald Foots, MD  01/10/17 2104

## 2017-01-09 ENCOUNTER — Encounter
Admission: EM | Disposition: A | Discharge status: Skilled Nursing Facility | Payer: Self-pay | Source: Home / Self Care | Attending: Internal Medicine

## 2017-01-09 LAB — BASIC METABOLIC PANEL
Anion Gap: 11 (ref 5.0–15.0)
BUN: 33 mg/dL — ABNORMAL HIGH (ref 9.0–28.0)
CO2: 23 mEq/L (ref 22–29)
Calcium: 8.2 mg/dL (ref 7.9–10.2)
Chloride: 108 mEq/L (ref 100–111)
Creatinine: 0.8 mg/dL (ref 0.7–1.3)
Glucose: 76 mg/dL (ref 70–100)
Potassium: 3.8 mEq/L (ref 3.5–5.1)
Sodium: 142 mEq/L (ref 136–145)

## 2017-01-09 LAB — CBC AND DIFFERENTIAL
Absolute NRBC: 0 10*3/uL
Basophils Absolute Automated: 0.02 10*3/uL (ref 0.00–0.20)
Basophils Automated: 0.1 %
Eosinophils Absolute Automated: 0.04 10*3/uL (ref 0.00–0.70)
Eosinophils Automated: 0.3 %
Hematocrit: 29.3 % — ABNORMAL LOW (ref 42.0–52.0)
Hgb: 9.1 g/dL — ABNORMAL LOW (ref 13.0–17.0)
Immature Granulocytes Absolute: 0.12 10*3/uL — ABNORMAL HIGH
Immature Granulocytes: 0.8 %
Lymphocytes Absolute Automated: 0.94 10*3/uL (ref 0.50–4.40)
Lymphocytes Automated: 6 %
MCH: 26.5 pg — ABNORMAL LOW (ref 28.0–32.0)
MCHC: 31.1 g/dL — ABNORMAL LOW (ref 32.0–36.0)
MCV: 85.2 fL (ref 80.0–100.0)
MPV: 11.2 fL (ref 9.4–12.3)
Monocytes Absolute Automated: 0.85 10*3/uL (ref 0.00–1.20)
Monocytes: 5.5 %
Neutrophils Absolute: 13.6 10*3/uL — ABNORMAL HIGH (ref 1.80–8.10)
Neutrophils: 87.3 %
Nucleated RBC: 0 /100 WBC (ref 0.0–1.0)
Platelets: 268 10*3/uL (ref 140–400)
RBC: 3.44 10*6/uL — ABNORMAL LOW (ref 4.70–6.00)
RDW: 17 % — ABNORMAL HIGH (ref 12–15)
WBC: 15.57 10*3/uL — ABNORMAL HIGH (ref 3.50–10.80)

## 2017-01-09 LAB — GLUCOSE WHOLE BLOOD - POCT
Whole Blood Glucose POCT: 106 mg/dL — ABNORMAL HIGH (ref 70–100)
Whole Blood Glucose POCT: 124 mg/dL — ABNORMAL HIGH (ref 70–100)
Whole Blood Glucose POCT: 104 mg/dL — ABNORMAL HIGH (ref 70–100)
Whole Blood Glucose POCT: 134 mg/dL — ABNORMAL HIGH (ref 70–100)

## 2017-01-09 LAB — GFR: EGFR: >60

## 2017-01-09 LAB — PROCALCITONIN: Procalcitonin: 6.7 — ABNORMAL HIGH (ref 0.0–0.1)

## 2017-01-09 LAB — HEMOLYSIS INDEX: Hemolysis Index: 49 — ABNORMAL HIGH (ref 0–18)

## 2017-01-09 LAB — TROPONIN I: Troponin I: 0.04 ng/mL (ref 0.00–0.09)

## 2017-01-09 SURGERY — G,J,G/J TUBE REPLACEMENT

## 2017-01-09 MED ORDER — FENTANYL CITRATE (PF) 50 MCG/ML IJ SOLN (WRAP)
INTRAMUSCULAR | Status: AC | PRN
Start: 2017-01-09 — End: 2017-01-09
  Administered 2017-01-09: 50 ug via INTRAVENOUS

## 2017-01-09 MED ORDER — LIDOCAINE HCL (PF) 1 % IJ SOLN
INTRAMUSCULAR | Status: DC
Start: 2017-01-09 — End: 2017-01-09
  Filled 2017-01-09: qty 30

## 2017-01-09 MED ORDER — LIDOCAINE(URO-JET) 2% JELLY (WRAP)
CUTANEOUS | Status: AC
Start: 2017-01-09 — End: 2017-01-09
  Administered 2017-01-09: 10 mL
  Filled 2017-01-09: qty 1

## 2017-01-09 MED ORDER — VENELEX EX OINT
TOPICAL_OINTMENT | Freq: Two times a day (BID) | CUTANEOUS | Status: DC
Start: 2017-01-09 — End: 2017-01-30
  Filled 2017-01-09 (×2): qty 60

## 2017-01-09 MED ORDER — GLUCAGON 1 MG IJ SOLR (WRAP)
INTRAMUSCULAR | Status: DC
Start: 2017-01-09 — End: 2017-01-09
  Filled 2017-01-09: qty 1

## 2017-01-09 MED ORDER — LIDOCAINE-EPINEPHRINE 1 %-1:100000 IJ SOLN
INTRAMUSCULAR | Status: AC
Start: 2017-01-09 — End: 2017-01-09
  Administered 2017-01-09: 10 mL
  Filled 2017-01-09: qty 20

## 2017-01-09 MED ORDER — FENTANYL CITRATE (PF) 50 MCG/ML IJ SOLN (WRAP)
INTRAMUSCULAR | Status: AC
Start: 2017-01-09 — End: 2017-01-09
  Filled 2017-01-09: qty 5

## 2017-01-09 NOTE — Progress Notes (Signed)
Infectious Diseases & Tropical Medicine  Progress Note    01/09/2017   Masiah Woody ZOX:[id number] is a 83 y.o. male,       Assessment:      Altered mental status resolved.   Chest x-ray-vascular congestion.   Sputum culture-gram-negative rods, awaiting identification.   Recently treated MDR organisms causing pneumonia   Pro-calcitonin levels-6.7 (01/09/2017).   Urine culture not available   Patient without Foley catheter.   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   Status post tracheostomy andG-tube 6placement   Resident of [name]   Leukocytosis improving   Penicillin allergy    Plan:      Discontinue vancomycin.   Continue meropenem.   Repeat pro-calcitonin level.   Follow-up chest x-ray.   Continue probiotics.   Monitor electrolytes and renal functions closely.   Monitor clinically.   Discussed with patient in detail.      ROS:     General:  no fever, no chills, no rigor, awake and alert,appears very comfortable  HEENT: no neck pain, no throat pain, hard of hearing, tracheostomy in place  Endocrine:  no fatigue, no night sweats  Respiratory: no cough, shortness of breath, or wheezing   Cardiovascular: no chest pain   Gastrointestinal: no abdominal pain,no N/V/D  Genito-Urinary: no dysuria or hematuria   Musculoskeletal: no edema  Neurological: c/o generalized weakness   Dermatological: no rash, no ulcer    Physical Examination:     Blood pressure 122/70, pulse (!) 133, temperature 98.2 F (36.8 C), temperature source Axillary, resp. rate (!) 11, height 1.854 m (6\' 1" ), weight 97.8 kg (215 lb 8 oz), SpO2 99 %.     General Appearance: Comfortable, and in no acute distress.   HEENT: Pupils are equal, round, and reactive to light.    Lungs: Decreased breath sounds   Heart:  Tachycardia   Chest: Symmetric chest wall expansion.    Abdomen: soft ,non tender,no hepatosplenomegaly,good bowel sounds   Neurological:  Paraplegia   Extremities: No edema    Laboratory And Diagnostic Studies:     Recent Labs      01/09/17   0347  01/08/17   1029   WBC  15.57*  26.48*   Hgb  9.1*  10.5*   Hematocrit  29.3*  34.7*   Platelets  268  349     Recent Labs      01/09/17   0347  01/08/17   1029   Sodium  142  140   Potassium  3.8  4.4   Chloride  108  101   CO2  23  30*   BUN  33.0*  32.0*   Creatinine  0.8  0.8   Glucose  76  179*   Calcium  8.2  9.1     Recent Labs      01/08/17   1029   AST (SGOT)  24   ALT  24   Alkaline Phosphatase  67   Protein, Total  6.1   Albumin  2.5*       Current Meds:      Scheduled Meds: PRN Meds:      albuterol-ipratropium 3 mL Nebulization Q4H SCH   amiodarone 200 mg Oral Daily   amLODIPine 5 mg Oral Daily   atorvastatin 20 mg per G tube QHS   balsam peru-castor oil (VENELEX)  Topical Q12H   bumetanide 0.5 mg per G tube Daily  famotidine 20 mg Oral QHS   glycopyrrolate 1 mg per G tube Daily   lactobacillus/streptococcus 1 capsule per G tube Daily   meropenem 500 mg Intravenous Q8H SCH   metoclopramide 5 mg per G tube Q8H   metoprolol tartrate 25 mg per G tube BID   rivaroxaban 20 mg per G tube Daily with dinner   scopolamine 1 patch Transdermal Q72H   vancomycin 1,500 mg Intravenous Q24H       Continuous Infusions:  . dextrose 5 % and 0.9% NaCl 75 mL/hr at 01/09/17 0900      acetaminophen 650 mg Q4H PRN   Or     acetaminophen 650 mg Q4H PRN   acetaminophen 650 mg Q6H PRN   naloxone 0.2 mg PRN   ondansetron 4 mg Q6H PRN   Or     ondansetron 4 mg Q6H PRN   oxyCODONE-acetaminophen 1 tablet Q6H PRN   senna 17.2 mg QD PRN         Morris Longenecker A. Janalyn Rouse, M.D.  01/09/2017  10:24 AM

## 2017-01-09 NOTE — Progress Notes (Signed)
MCR Fx PNA, READMIT    Pt from [facility] SN[facility] Vent / Resp unit. Pt is 30D readmission. CM met w/ bedside nurse and pt face to face in room. Pt is able to mouth words and respond appropriately to yes/no questions. Bedside nurse reports pt is able to assist w/ repositioning, able to move upper ext, and able to communicate. Voicemail was left w/ Dtr [name], awaiting return call. Dr [name] is pt's attending in SNF.    CM spoke w/ Admissions coordinator at [facility], [name] to inquire about pt's ability to return to [place]. Dates of stay at [facility] 10/09/16-12/14/16; hosp 1/5/8-1/18/18; readmitted to [facility] 12/27/16. [name] notified CM that if pt is requiring isolation at [facility] there may be a barrier as [name] does not currently have isolation beds on the vent unit. CM sent initial referral and will cont to follow up as needed for bed placement    DCP likely to return to [facility], pending any iso needs at [place]. 48hr touchpoint is SNF admission    Transport via ambulance       01/09/17 0930   Patient Type   Within 30 Days of Previous Admission? Yes   Healthcare Decisions   Interviewed: Patient;Other (Comment)   Interviewee Contact Information: bedside nurse, voicemail left for dtr [name]   Orientation/Decision Making Abilities of Patient Patient on ventilator  (able to mouth words and answer yes/no questions)   Advance Directive Patient does not have advance directive   Healthcare Agent Appointed No   Prior to admission   Prior level of function Bedbound / Total Care;Needs assistance with ADLs   Type of Residence Nursing home  (admitted from [facility])   Have running water, electricity, heat, etc? Yes   Living Arrangements Other (Comment)  (SNF)   How do you get to your MD appointments? facility   How do you get your groceries? facility   Who fixes your meals? facility   Who does your laundry? facility   Who picks up your prescriptions? facility   Dressing Dependent   Grooming Dependent   Feeding  Dependent   Bathing Dependent   Toileting Dependent   DME Currently at Home Other (Comment)  (pt resides in SNF on Vent / Resp unit)   Home Care/Community Services TCM;RN Navigators   Name of Agency TCM program   Prior SNF admission? (Detail) [name]   Adult [employer] (APS) involved? No   Discharge Planning   Support Systems Children   Patient expects to be discharged to: return to [facility]   Anticipated [place] plan discussed with: Same as interviewed   Potential barriers to discharge: Other  (if needing isolation, [name] does not currently have iso beds)   Mode of transportation: Other  (ambulance)   Consults/Providers   PT Evaluation Needed 1   OT Evalulation Needed 1   SLP Evaluation Needed 2   Correct PCP listed in Epic? Yes   Important Message from [facility] Notice   Patient received 1st IMM Letter? No     Ronald Reno, RN  Clinical Case Manager I  [phone]

## 2017-01-09 NOTE — Consults (Signed)
Wound Ostomy Continence Consultation    Date Time: 01/09/17 9:44 AM  Patient Name: [name],[name]  Consulting Service: WOCN  Reason for Consult: wound on sacrum    Assessment & Plan     Assessment:    Stage 2- Coccyx -  present on admission: Partial thickness open wound, measures 2 cm x 1.8 cm x 0.2 , wound covered with 100% pink, Peri wound some areas non blanchable erythema, and butter fly shape seems appear due to friction and shear.  No drainage noted. Palpable bone noted at wound bed.          PLAN:    Coccyx -cleanse wound with normal saline/wound cleanser every 12 hours and PRN. Apply venelex to wound bed. Nickel thick layer. Protect peri wound with no sting barrier film wipes/spray cavilon. Cover with adhesive sacral foam dressing. Apply sacral adhesive foam dressing by molding the foam into the buttock cleft, making sure to not "pinch'" the buttocks together.     Venelex to stimulate effective capillary bed action and increase circulation to wound along with providing moist        Initiate/ continue pressure prevention bundle:   Head of the bed 30 degrees or less   Positioning device to the bedside   Eliminate/minimize pressure from area   Float heels with boots or pillows   Turn patient   Pressure redistribution cushion to chair   Use lift sheets/low friction surface sheets for positioning   Pad bony prominences   Nutrition consult/Optimize nutrition   Initiate bed algorith/Specialty bed   Moisture/Incontinence management - Cleanse with incontinence cleansing wipe/water to manage incontinence and to protect skin from exposure to urine and stool. Apply skin barrier protection cream. Apply [place]/external or foley catheter to prevent urinary contamination of wound. Apply rectal pouch/fecal management system per unit policy, to prevent fecal contamination of wound or to contain diarrhea.         Wound care orders entered into EMR. Care rendered. Please re-consult  WOC Nurse team if wound deteriorates or further assistance required    Objective Findings   Specialty Bed:    total care sport    Braden Score:          History of Presenting Illness   HPI: Thaxton Pelley is a 83 y.o. male [facility]  [name] had [facility] fracture with Quadriparesis , trach and vented and G tube  Feeding was recently discharged from [facility] after being treated for multiple MDR organism from sputium and Pneumonia brought  from [facility] for altered mental status on the day of admission      Past Medical and Surgical History     Past Medical History:   Diagnosis Date   . C2 cervical fracture    . Chronic pain    . Conjunctivitis    . Constipation    . Cutaneous abscess of buttock    . Dysphagia    . Hypertension    . Insomnia    . Kidney disorder    . Respiratory arrest        Past Surgical History:   Procedure Laterality Date   . GASTROSTOMY TUBE PLACEMENT     . TRACHEOSTOMY       POD: @ORDAYSPST  @LOS :  LOS: 1 day         Lab   Significant Lab Values:    Recent Labs      01/09/17   0347  01/08/17   1029   WBC  15.57*  26.48*   RBC  3.44*  3.93*   Hgb  9.1*  10.5*   Hematocrit  29.3*  34.7*   Sodium  142  140   Potassium  3.8  4.4   Chloride  108  101   CO2  23  30*   BUN  33.0*  32.0*   Creatinine  0.8  0.8   Calcium  8.2  9.1   Albumin   --   2.5*   EGFR  >60.0  >60.0   Glucose  76  179*    Prealbumin: No components found for: PREALBUMIN  Culture   N/A    Thank you for this consultation  Gessica Jawad "Sunshine" Dupree Givler  BSN, RN  [facility]  Wound, Ostomy and Continence Coordinator  [phone] Office  [phone] [employer]

## 2017-01-09 NOTE — Sedation Documentation (Addendum)
Patient in A-lab to undergo G-tube placement under flouroscopy by Dr. Concha Se. Patient placed supine on table. Monitors applied. VSS. Patient baseline tachycardic.A pre procedural timeout was performed. All were in agreement. Patient tolerated procedure well. VSS through out. 20 Fr G tube placed Midline. Denies pain. Floor nurse given report.. Portable telemetry box and chart placed on bed. Ok to transport off of telemetry order verified. Transported back to unit by respiratory and Emergency planning/management officer

## 2017-01-09 NOTE — Consults (Addendum)
Nutritional Support Services  Nutrition Assessment    [name] 83 y.o. male   MRN: [id number]    Summary of Nutrition Recommendations:   1. After PEG replacement and when cleared by MD to initiate TF, recommend initiate Promote @ 20 mls/hr and advance by 10 mls Q 4 hrs to goal rate of 60 mls/hr.    Provides 1440 total kcals, 90 gm protein, and 1208 mls free water in 1440 total mls     2. Provide 3 packets Prosource daily   Each packet Prosource provides 15 gm protein and 60 kcal.     EN + Prosource provides 1620 total kcals, 135 gm protein, and 1208 mls free water in 1440 total mls.   Meets 100% estimated kcal and protein needs.     3. Flush tube with minimum of 30-50 ml Q 4 hours for tube patency- additional fluids for hydration per MD    4. Monitor weekly weights     5. Consider d/c dextrose 5% infusion when TF initiated     Recommendations discussed with RN Shon Hale.  -----------------------------------------------------------------------------------------------------------------                                                      Assessment Data:   Referral Source: Consult/Census Review  Reason for Referral: G-Tube Feedings/Mechanical Ventilation     Nutrition: RDN met with pt in pt's room. Pt states he is feeling well. Reports feeling thirsty, RDN explained to pt he is NPO at this time. Denies nausea/vomiting. Weight hx shows fluctuations in pt's weight during past admission (within this month) - likely bed scale errors but will continue to monitor for trend.  Spoke with RN Shon Hale, states pt's PEG has been leaking. States plan is for pt to receive PEG replacement today. Discussed that it may be appropriate to initiate TF after 4 hrs of PEG placement, RN Flor verbalized understanding. RDN left VM for MD to discuss plan to initiate TF after PEG placement, awaiting MD callback at this time.   RDN spoke with [name] at RDN, states pt has been receiving Isosource 1.5 @ 50 mls/hr x 24 h rs + protein powder  (provides 28 gm/day). Pt also receiving arginade and vitamins. [name] RDN states pt's weight has been decreasing but suspects decreased weights may be outliers.  Nutrition focused physical examination performed.     Hospital Admission: Pt with sepsis, acute on chronic respiratory failure + trach, + vent, UTI, PNA, encephalopathy, chronic a fib, HTN    Medical Hx:  has a past medical history of C2 cervical fracture; Chronic pain; Conjunctivitis; Constipation; Cutaneous abscess of buttock; Dysphagia; Hypertension; Insomnia; Kidney disorder; and Respiratory arrest.  PSH: has a past surgical history that includes Tracheostomy and Gastrostomy tube placement.      Diet Hx/Social Hx: from [place], single    Orders Placed This Encounter   Procedures   . Diet NPO effective now     Intake: pt NPO since admission     ANTHROPOMETRIC  Anthropometrics  Height: 185.4 cm (6' 0.99")  Weight: 101.2 kg (223 lb 1.7 oz)  Weight Change: 3.53  IBW/kg (Calculated) Male: 83.65 kg  IBW/kg (Calculated) Male: 74.95 kg  BMI (calculated): 29.5    Weight Monitoring 12/22/2016 12/23/2016 12/25/2016 12/26/2016 01/08/2017 01/08/2017 01/09/2017   Height - - - - - 185.4 cm  185.4 cm   Height Method - - - - - Estimated (No Data)   Weight 115.8 kg 111.3 kg 109.1 kg 109.1 kg 109 kg 97.75 kg 101.2 kg   Weight Method Bed Scale Bed Scale Bed Scale Bed Scale - - Bed Scale   BMI (calculated) - - - - - 28.5 kg/m2 29.5 kg/m2     Weight History Summary: weight hx shows weight increased and decreased x past 1 month; possible weight loss trend noted but will continue to monitor for trend; per documentation from previous RDN note (from prior admission); pt with major weight descrepancies during previous admissions; RDN obtained new bedscale weight of 101.2kg - will use to calculate kcal/protein needs    Physical Assessment:   Head: no signs of malnutrition (1/31)  Upper Body: no signs of malnutrition (1/31)  Edema: none, per RN flowsheet  Skin: not notable, per RN  flowsheet  GI function: stage 2 pressure injury on coccyx, per RN flowsheet    ESTIMATED NEEDS  Estimated Energy Needs  Total Energy Estimated Needs: 1610-9604 kcals/day  Method for Estimating Needs: 15-20 kcals/kg ABW of 97.8kg (BMI 30, tele pt)    Estimated Protein Needs  Total Protein Estimated Needs: 122-166 gm/day  Method for Estimating Needs: 1.5-2.0 gm/kg IBW of 83kg (BMI 30, tele pt, stage 2 wound)    Fluid Needs  Method for Estimating Needs: 1 ml/kcal or per MD    Pertinent Medications: Reviewed; lipitor, risaquad, reglan, dextrose 5% infusion     IVF:    . dextrose 5 % and 0.9% NaCl 75 mL/hr at 01/09/17 1300     Allergies   Allergen Reactions   . Dilaudid [Hydromorphone Hcl]      Pertinent labs: Reviewed; Glucose 76-179 x past 24 hrs    Learning Needs: None at this time.                                                               Nutrition Diagnosis      Inadequate oral intake related to respiratory failure as evidenced by mechanical ventilation/NPO and pt awaiting PEG replacement.                                                               Intervention     Nutrition recommendation -    1. After PEG replacement and when cleared by MD to initiate TF, recommend initiate Promote @ 20 mls/hr and advance by 10 mls Q 4 hrs to goal rate of 60 mls/hr.    Provides 1440 total kcals, 90 gm protein, and 1208 mls free water in 1440 total mls     2. Provide 3 packets Prosource daily   Each packet Prosource provides 15 gm protein and 60 kcal.     EN + Prosource provides 1620 total kcals, 135 gm protein, and 1208 mls free water in 1440 total mls.   Meets 100% estimated kcal and protein needs.     3. Flush tube with minimum of 30-50 ml Q 4 hours for tube patency- additional fluids for hydration per  MD    4. Monitor weekly weights     5. Consider d/c dextrose 5% infusion when TF initiated      Goal: Pt will tolerate >80% TF goal rate x 24-48 hrs                                                             Monitoring      Monitor EN initiation and tolerance, labs, GI function, and weight.                                                         Evaluation     Nutrition Risk Level: High (will follow up within 5 days and PRN)     [name], RDN   Ext. [phone]

## 2017-01-09 NOTE — Brief Op Note (Signed)
[facility]  CVIR   Brief Op Note     Physician(s): Dara Lords, MD    Assistant(s):  None    Pre-operative Diagnosis: Feeding difficulties, unable to replace g tube at bedside    Post-operative Diagnosis: Diagnosis is same as preop diagnosis    Procedure(s) Performed: Percutaneous gastrostomy replacement with sinogram and tract dilation                                                                                                                                                                                                               Anesthesia:  Local with 1% Lidocaine    Complications: None    Estimated Blood Loss:  MInimal    Blood Aministered:  None    Fluid Aministered:  Per Nursing    Tubes and Drains:  20  Fr Gastrostomy tube    Implant(s):  None    Specimens: None    Findings: 20 Fr percutaneous gastrostomy tube replaced after tract dilation    Patient was transferred from the procedure room to the medical unit in stable condition.  Procedure note dictated.    Signed by: Dara Lords, MD  CVIR Department  [facility] [phone]  [facility] [phone]

## 2017-01-09 NOTE — Plan of Care (Signed)
Problem: Moderate/High Fall Risk Score >5  Goal: Patient will remain free of falls   01/09/17 0200   OTHER   High (Greater than 13) HIGH-(VH Only) Place red fall prevention sign on door/chart;HIGH-Consider use of low bed   Bed alarm activated, floor mats in use, call light within reach of patient.    Problem: Safety  Goal: Patient will be free from injury during hospitalization  Outcome: Progressing   01/09/17 1330   Goal/Interventions addressed this shift   Patient will be free from injury during hospitalization  Assess patient's risk for falls and implement fall prevention plan of care per policy;Provide and maintain safe environment;Use appropriate transfer methods;Ensure appropriate safety devices are available at the bedside;Include patient/ family/ care giver in decisions related to safety;Hourly rounding;Assess for patients risk for elopement and implement Elopement Risk Plan per policy;Provide alternative method of communication if needed (communication boards, writing)       Problem: Inadequate Gas Exchange  Goal: Provide mechanical and oxygen support to facilitate gas exchange  Outcome: Progressing  Patient alert and oriented - mouths words, writes on paper to communicate, follows commands; afebrile, A. Fib on monitor; chronic trache patient - vented PRVC, rate 18, TV 500, peep 5, FiO2 35% satting 98%, no c/o pain this morning will continue to monitor. Patient is able to assist with turning for incontinence care.

## 2017-01-09 NOTE — UM Notes (Signed)
INPT  Med  Ab  01/08/17 1233  Admit to Inpatient (Adult Inpatient Admit Panel (AX)) Once    Status:    Question Answer Comment   Admitting Physician AL Carilyn Goodpasture, [name]    Diagnosis Altered mental status    Estimated Length of Stay > or = to 2 midnights    Tentative Discharge Plan? Home or Self Care    Patient Class Inpatient    Bed request comments Contact isolation                 83 yr male to ED via EMS,  Fr [name] for altered mentation, not able to respond,sudden  onset today. different fr baseline.  Nursing center staff also reports leaking g tube.   Hx, remote cervical fracture with Quadriparesis , trach and vented and G tube  Feeding was recently discharged from [facility] after being treated for multiple MDR organism from sputium and Pneumonia brought  from [facility] for altered mental status on the day of admission  Blood pressure 109/55, pulse (!) 124, temperature 97.3 F (36.3 C), temperature source Oral, resp. rate 21, weight 109 kg (240 lb 4.8 oz), SpO2 98 %.  Alert, follows commands  Past Medical History:   Diagnosis Date   . C2 cervical fracture    . Chronic pain    . Conjunctivitis    . Constipation    . Cutaneous abscess of buttock    . Dysphagia    . Hypertension    . Insomnia    . Kidney disorder    . Respiratory arrest      Past Surgical History:   Procedure Laterality Date   . GASTROSTOMY TUBE PLACEMENT     . TRACHEOSTOMY       UA, sl cloudy sg 1017  LG leuks, 100 pro, 50 gluc, LG blood, WBC and RBS TNTC.      01/08/17   1029   WBC  26.48*   Hgb  10.5*   Hematocrit  34.7*   Platelets  349   MCV  88.3           Recent Labs      01/08/17   1029   Sodium  140   Potassium  4.4   Chloride  101   CO2  30*   BUN  32.0*   Creatinine  0.8   Glucose  179*   Calcium  9.1   ADMIT INPT  CVICU  AMS  UTI  pneumonia  Trach care/support  O2 via trach 35 % FIO2.  IVAB per ID  NPO  G tube leaking at site)  IVF at 75    ID consult*    *urinalysis is consistent with urinary  tract infection.  Urine blood and sputum cultures are ordered and are pending.  Chest x-ray showing improving vascular congestion.  Patient is started on vancomycin and tobramycin.  No seizure or syncopal episode.  Patient has a chronic back wound  Assessment:      Altered mental status.   Possibility of UTI.   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   Status post tracheostomy andG-tube 6placement   Resident of [name] nursing [facility]   Recently treated MDR organism pneumonia.   Penicillin allergy  .  Recommendations:      Start vancomycin and meropenem.   Pharmacy to monitor vancomycin levels   Await pending cultures   Pro-calcitonin  level   Probiotics    Monitor electrolytes and renal functions closely   Monitor clinically      Dcp, pt fr [name].  Pershing Proud MSN RN ACM [phone]  Utilization Review Nurse, Case Management  [facility]  [phone]  (hospital staff only)         Please submit all clinical review requests via fax to [phone].

## 2017-01-09 NOTE — Progress Notes (Signed)
Results for anaerobic blood culture is positive for gram + rods

## 2017-01-09 NOTE — Progress Notes (Signed)
INTERNAL MEDICINE PROGRESS NOTE    Progress Note - [name]    Date Time: 01/09/17 12:11 PM  Patient Name: 83 y.o. male with <principal problem not specified>      Assessment :    Sepsis   Malfunction G-tube   Acute on chronic respiratory failure/trached and ventilated   Urinary tract infection   Pneumonia   Encephalopathy, metabolic   Remote history of cervical vertebral fracture with quadriparesis   Chronic atrial fibrillation on anticoagulation   Hypertension      Plan:    Trach and vent care   CVIR consulted for malfunctioning of G-tube   Oxygenation   Leucocytosis is downgoing   High pro-calcitonin noted   Continue vancomycin and Merrem    culture no Growth to date   Gastrointestinal and deep vein thrombosis prophylaxis   Continue home medications   ID consulted   Subsequent recommendation to follow    Subjective:   ICDB      Review of Systems:    Incomplete database    Physical Exam:     Vitals:    01/09/17 1201   BP:    Pulse:    Resp:    Temp: 98.7 F (37.1 C)   SpO2:        Intake/Output Summary (Last 24 hours) at 01/09/17 1211  Last data filed at 01/09/17 0900   Gross per 24 hour   Intake              450 ml   Output                0 ml   Net              450 ml     General appearance - Awake /trached and ventilated /on G-tube feeding Eyes - pupils equal and reactive, extraocular eye movements intact  Ears - bilateral TM's and external ear canals normal  Nose - normal and patent, no erythema, discharge or polyps  Mouth - mucous membranes moist, pharynx normal without lesions  Neck - supple, no significant adenopathy  Lymphatics - no palpable lymphadenopathy  Chest - decreased air entry at the bases of the lung, bilateral  Heart -irregular  Abdomen - G-tube is intact  , soft abdomen   Back exam - full range of motion, no tenderness, palpable spasm or pain on motion  Neurological - awake quadriparesis  Musculoskeletal - no joint tenderness, deformity or  swelling  Extremities - peripheral pulses normal, no pedal edema, no clubbing or cyanosis  Skin - normal coloration and turgor, no rashes, no suspicious skin lesions noted    Meds Reviewed: Yes  Medications:     Current Facility-Administered Medications   Medication Dose Route Frequency   . albuterol-ipratropium  3 mL Nebulization Q4H SCH   . amiodarone  200 mg Oral Daily   . amLODIPine  5 mg Oral Daily   . atorvastatin  20 mg per G tube QHS   . balsam peru-castor oil (VENELEX)   Topical Q12H   . bumetanide  0.5 mg per G tube Daily   . famotidine  20 mg Oral QHS   . glycopyrrolate  1 mg per G tube Daily   . lactobacillus/streptococcus  1 capsule per G tube Daily   . meropenem  500 mg Intravenous Q8H SCH   . metoclopramide  5 mg per G tube Q8H   . metoprolol tartrate  25 mg per G tube BID   .  rivaroxaban  20 mg per G tube Daily with dinner   . [START ON 01/10/2017] scopolamine  1 patch Transdermal Q72H   . vancomycin  1,500 mg Intravenous Q24H       PRN Medication:  acetaminophen **OR** acetaminophen, acetaminophen, naloxone, ondansetron **OR** ondansetron, oxyCODONE-acetaminophen, senna      Labs:     Results     Procedure Component Value Units Date/Time    MRSA culture [[id number]] Collected:  01/09/17 0423    Specimen:  Body Fluid from Nares and Throat Updated:  01/09/17 1027    Procalcitonin [[id number]]  (Abnormal) Collected:  01/09/17 0347     Updated:  01/09/17 0940     Procalcitonin 6.7 (H)    Glucose Whole Blood - POCT [[id number]]  (Abnormal) Collected:  01/09/17 0759     Updated:  01/09/17 0828     POCT - Glucose Whole blood 106 (H) mg/dL     Troponin I [[id number]] Collected:  01/09/17 0347    Specimen:  Blood Updated:  01/09/17 0447     Troponin I 0.04 ng/mL     Basic Metabolic Panel [[id number]]  (Abnormal) Collected:  01/09/17 0347     Updated:  01/09/17 0439     Glucose 76 mg/dL      BUN 24.4 (H) mg/dL      Creatinine 0.8 mg/dL      Calcium 8.2 mg/dL      Sodium 010 mEq/L      Potassium 3.8 mEq/L       Chloride 108 mEq/L      CO2 23 mEq/L      Anion Gap 11.0    Hemolysis index [[id number]]  (Abnormal) Collected:  01/09/17 0347     Updated:  01/09/17 0439     Hemolysis Index 49 (H)    GFR [[id number]] Collected:  01/09/17 0347     Updated:  01/09/17 0439     EGFR >60.0    CBC with differential [[id number]]  (Abnormal) Collected:  01/09/17 0347    Specimen:  Blood from Blood Updated:  01/09/17 0424     WBC 15.57 (H) x10 3/uL      Hgb 9.1 (L) g/dL      Hematocrit 29.5 (L) %      Platelets 268 x10 3/uL      RBC 3.44 (L) x10 6/uL      MCV 85.2 fL      MCH 26.5 (L) pg      MCHC 31.1 (L) g/dL      RDW 17 (H) %      MPV 11.2 fL      Neutrophils 87.3 %      Lymphocytes Automated 6.0 %      Monocytes 5.5 %      Eosinophils Automated 0.3 %      Basophils Automated 0.1 %      Immature Granulocyte 0.8 %      Nucleated RBC 0.0 /100 WBC      Neutrophils Absolute 13.60 (H) x10 3/uL      Abs Lymph Automated 0.94 x10 3/uL      Abs Mono Automated 0.85 x10 3/uL      Abs Eos Automated 0.04 x10 3/uL      Absolute Baso Automated 0.02 x10 3/uL      Absolute Immature Granulocyte 0.12 (H) x10 3/uL      Absolute NRBC 0.00 x10 3/uL     Basic Metabolic Panel [[id number]] Collected:  01/09/17 0347    Specimen:  Blood Updated:  01/09/17 0347    Troponin I [[id number]]  (Abnormal) Collected:  01/08/17 1910    Specimen:  Blood Updated:  01/08/17 2333     Troponin I 0.10 (H) ng/mL     Lactic acid [[id number]]  (Abnormal) Collected:  01/08/17 1023    Specimen:  Blood Updated:  01/08/17 1832     Lactic acid 3.2 (H) mmol/L     Sputum Culture [[id number]] Collected:  01/08/17 1129    Specimen:  Sputum from Tracheal Aspirate Updated:  01/08/17 1723    Narrative:       ORDER#: [id number]                                    ORDERED BY: Lehman Prom  SOURCE: Tracheal Aspirate trach                      COLLECTED:  01/08/17 11:29  ANTIBIOTICS AT COLL.:                                RECEIVED :  01/08/17 16:01  Stain, Gram (Respiratory)                  FINAL        01/08/17 17:23  01/08/17   Few WBC's             Rare WBC's             Moderate Mixed Respiratory Flora  Culture and Gram Stain, Aerobic, RespiratorPENDING      MRSA Culture [[id number]] Collected:  01/08/17 1029    Specimen:  Body Fluid from Nares and Throat Updated:  01/08/17 1548    Urine culture [[id number]] Collected:  01/08/17 1129    Specimen:  Urine from Urine, Catheterized, In & Out Updated:  01/08/17 1548    Blood Culture Aerobic/Anaerobic #1 [[id number]] Collected:  01/08/17 1029    Specimen:  Arm from Blood Updated:  01/08/17 1523    Narrative:       1 BLUE+1 PURPLE    Blood Culture Aerobic/Anaerobic #2 [[id number]] Collected:  01/08/17 1029    Specimen:  Arm from Blood Updated:  01/08/17 1523    Narrative:       1 BLUE+1 PURPLE    Lactic acid [[id number]]  (Abnormal) Collected:  01/08/17 1129    Specimen:  Blood Updated:  01/08/17 1404     Lactic acid 2.2 (H) mmol/L     UA, Reflex to Microscopic (pts 3 + yrs) [[id number]]  (Abnormal) Collected:  01/08/17 1129    Specimen:  Urine Updated:  01/08/17 1225     Urine Type Clean Catch     Color, UA Amber (A)     Clarity, UA Sl Cloudy (A)     Specific Gravity UA 1.017     Urine pH 6.0     Leukocyte Esterase, UA Large (A)     Nitrite, UA Negative     Protein, UR 100 (A)     Glucose, UA 50 (A)     Ketones UA Negative     Urobilinogen, UA Negative mg/dL      Bilirubin, UA Negative     Blood, UA Large (A)     RBC, UA TNTC (A) /hpf      WBC, UA TNTC (A) /hpf  Squamous Epithelial Cells, Urine 0 - 5 /hpf             @MICROIP @    IRadiology:   Radiological Procedure reviewed.      Hershal Coria, MD  Spectra link [id number]  [facility] clinic  [phone]  01/09/2017  12:11 PM

## 2017-01-09 NOTE — Progress Notes (Signed)
[facility]  CVIR   Progress Note     Asked by Dr. [name] to evaluate leaking PEG tube  H/O trach/PEG several years ago at [facility] hospital    PEG flushed with leakage around site  Skin around PEG insertion site CDI without distention, erythema, erythema  Tube removed at bedside without incident, pt tolerated well    Unable to advance 20 Fr gastrostomy via well formed tract at bedside.  Tube kinking into SQ tissue    PLAN: Gastrostomy tube placement under fluoroscopy in CVIR today    Gilford Raid, NP  CVIR Department  [facility]  [phone]

## 2017-01-09 NOTE — Procedures (Signed)
Patient in A-lab to undergo G-tube placement under flouroscopy by Dr. Concha Se. Patient placed supine on table. Monitors applied. VSS. Patient baseline tachycardic.A pre-procedural timeout was performed. All were in agreement. Patient tolerated procedure well. VSS through out procedure. 20 Fr G tube placed Midline slightly to the left. Drain sponge applied.Denies pain. Floor nurse given report. Portable telemetry box and chart placed on bed. Transport off of telemetry order verified. Transported back to unit by respiratory and Emergency planning/management officer

## 2017-01-09 NOTE — Progress Notes (Signed)
Admitted from [facility], alert, can follow commands, can move Right arm with weakness. Atrial fib on the monitor , HR in 100's , BP 99/52 mm Hg.. With trache, connected to vent 35 % FIO2. Thick , yellow , tan , large amount of secretions, lung sounds ronchi . ON NPO , due to peg tube leakage on insertion site.   Abdomen soft, not distended. D5NS running at 75 ml/hr. No foley, incontinent of stool and urine. Skin intact.

## 2017-01-09 NOTE — Plan of Care (Signed)
Problem: Moderate/High Fall Risk Score >5  Goal: Patient will remain free of falls  Outcome: Progressing   01/09/17 0200   OTHER   Moderate Risk (6-13) MOD-Floor mat at bedside (where available) if appropriate   High (Greater than 13) HIGH-(VH Only) Place red fall prevention sign on door/chart;HIGH-Consider use of low bed       Problem: Safety  Goal: Patient will be free from injury during hospitalization   01/09/17 0511   Goal/Interventions addressed this shift   Patient will be free from injury during hospitalization  Assess patient's risk for falls and implement fall prevention plan of care per policy;Provide and maintain safe environment;Use appropriate transfer methods;Ensure appropriate safety devices are available at the bedside;Include patient/ family/ care giver in decisions related to safety;Hourly rounding;Assess for patients risk for elopement and implement Elopement Risk Plan per policy;Provide alternative method of communication if needed (communication boards, writing)

## 2017-01-10 ENCOUNTER — Inpatient Hospital Stay: Payer: Medicare Other

## 2017-01-10 LAB — GFR: EGFR: >60

## 2017-01-10 LAB — ECG 12-LEAD
Atrial Rate: 89 {beats}/min
Q-T Interval: 392 ms
QRS Duration: 90 ms
QTC Calculation (Bezet): 503 ms
R Axis: 14 degrees
T Axis: -1 degrees
Ventricular Rate: 99 {beats}/min

## 2017-01-10 LAB — GLUCOSE WHOLE BLOOD - POCT
Whole Blood Glucose POCT: 148 mg/dL — ABNORMAL HIGH (ref 70–100)
Whole Blood Glucose POCT: 134 mg/dL — ABNORMAL HIGH (ref 70–100)
Whole Blood Glucose POCT: 137 mg/dL — ABNORMAL HIGH (ref 70–100)
Whole Blood Glucose POCT: 134 mg/dL — ABNORMAL HIGH (ref 70–100)
Whole Blood Glucose POCT: 123 mg/dL — ABNORMAL HIGH (ref 70–100)

## 2017-01-10 LAB — HEMOLYSIS INDEX: Hemolysis Index: 5 (ref 0–18)

## 2017-01-10 LAB — BASIC METABOLIC PANEL
Anion Gap: 6 (ref 5.0–15.0)
BUN: 29 mg/dL — ABNORMAL HIGH (ref 9.0–28.0)
CO2: 26 mEq/L (ref 22–29)
Calcium: 8.1 mg/dL (ref 7.9–10.2)
Chloride: 111 mEq/L (ref 100–111)
Creatinine: 0.7 mg/dL (ref 0.7–1.3)
Glucose: 97 mg/dL (ref 70–100)
Potassium: 3.3 mEq/L — ABNORMAL LOW (ref 3.5–5.1)
Sodium: 143 mEq/L (ref 136–145)

## 2017-01-10 LAB — PROCALCITONIN: Procalcitonin: 3.9 — ABNORMAL HIGH (ref 0.0–0.1)

## 2017-01-10 MED ORDER — SODIUM CHLORIDE 0.9 % IV SOLN
5.0000 mg/kg | Freq: Once | INTRAVENOUS | Status: DC
Start: 2017-01-10 — End: 2017-01-10
  Filled 2017-01-10: qty 11

## 2017-01-10 MED ORDER — SODIUM CHLORIDE 0.9 % IV MBP
3.0000 g | Freq: Four times a day (QID) | INTRAVENOUS | Status: DC
Start: 2017-01-10 — End: 2017-01-12
  Administered 2017-01-10 – 2017-01-12 (×9): 3 g via INTRAVENOUS
  Filled 2017-01-10 (×13): qty 20

## 2017-01-10 MED ORDER — SODIUM CHLORIDE 0.9 % IV SOLN
400.0000 mg | Freq: Once | INTRAVENOUS | Status: AC
Start: 2017-01-10 — End: 2017-01-10
  Administered 2017-01-10: 400 mg via INTRAVENOUS
  Filled 2017-01-10: qty 10

## 2017-01-10 NOTE — Progress Notes (Addendum)
Nutritional Support Services  Nutrition Note    [name] 83 y.o. male   MRN: [id number]    Nutrition Brief Note    Summary of Nutrition Recommendations:   1.Continue Promote @ 30 mls/hr via PEG and advance by 10 mls Q 4 hrs to goal rate of 60 mls/hr.               Provides 1440 total kcals, 90 gm protein, and 1208 mls free water in 1440 total mls     2. Continue 3 packets Prosource daily              Each packet Prosource provides 15 gm protein and 60 kcal.                EN + Prosource provides 1620 total kcals, 135 gm protein, and 1208 mls free water in 1440 total mls.              Meets 100% estimated kcal and protein needs.     3. Flush tube with minimum of 30-50 ml Q 4 hours for tube patency- additional fluids for hydration per MD    4. Continue weight monitoring      5. Consider d/c dextrose 5% infusion   _____________________________________________________________________________    F/U to RDN full assessment 1/31. Pt s/p PEG replacement last night. Per RN Earley Abide, TF initiated last night. RDN observed Promote infusing via PEG @ 30 mls/hr. Per RN Earley Abide, plan to advance rate soon. States pt tolerating TF well, no residuals documented in Epic. Pt denies nausea/vomiting.    RDN spoke with MD, Dr. Pollyann Glen, obtained order for tube feed and discussed recommended fluid flushes. Obtained order for 30 mls fluid flushes Q 4 hrs for tube patency and MD states he will alter fluid flushes as needed.    Will continue to follow at high nutrition risk and will reassess within 5 days or PRN.    [name], RDN   Ext. [phone]

## 2017-01-10 NOTE — Progress Notes (Addendum)
Infectious Diseases & Tropical Medicine  Progress Note    01/10/2017   Akeem Heppler ZHY:[id number],BMW:[id number] is a 83 y.o. male,       Assessment:      Sepsis.   Gram-positive rods bacteremia (2/2)-awaiting identification, likely diphtheroids as per microbiology   Altered mental status resolved.   Chest x-ray-increased bilateral pleural effusions and bibasilar consolidation (01/10/2017)   Sputum culture-gram-negative rods, awaiting identification.   Recently treated MDR organisms causing pneumonia   Pro-calcitonin level-6.7 (01/09/2017).   Pro-calcitonin level-3.9 (01/10/2017)   Urine culture-gram-negative rods, awaiting identification   Patient without Foley catheter.   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   Status post tracheostomy andG-tube 6placement   Resident of [name]   Fever   Penicillin allergy    Plan:      Start Unasyn and gentamicin.   Pharmacy to monitor gentamicin levels.   Await identification and sensitivities of above-mentioned organisms   Follow-up chest x-ray.   Continue probiotics.   Monitor electrolytes and renal functions closely.   Monitor clinically.   Discussed with pharmacy.   Discussed with microbiology    ROS:     General: patient has low-grade fever, no chills, no rigor, awake and alert,appears very comfortable  HEENT: no neck pain, no throat pain, hard of hearing, tracheostomy in place  Endocrine:  no fatigue, no night sweats  Respiratory: no cough, shortness of breath, or wheezing   Cardiovascular: no chest pain   Gastrointestinal: no abdominal pain,no N/V/D  Genito-Urinary: no dysuria or hematuria   Musculoskeletal: no edema  Neurological: c/o generalized weakness   Dermatological: no rash, no ulcer    Physical Examination:     Blood pressure 137/68, pulse (!) 103, temperature 99.5 F (37.5 C), temperature source Oral, resp. rate (!) 6, height 1.854 m (6' 0.99"), weight 100.7 kg (222 lb), SpO2  99 %.     General Appearance: Comfortable, and in no acute distress.   HEENT: Pupils are equal, round, and reactive to light.    Lungs: Decreased breath sounds   Heart:  Tachycardia   Chest: Symmetric chest wall expansion.    Abdomen: soft ,non tender,no hepatosplenomegaly,good bowel sounds   Neurological: Paraplegia   Extremities: No edema    Laboratory And Diagnostic Studies:     Recent Labs      01/09/17   0347  01/08/17   1029   WBC  15.57*  26.48*   Hgb  9.1*  10.5*   Hematocrit  29.3*  34.7*   Platelets  268  349     Recent Labs      01/10/17   0452  01/09/17   0347   Sodium  143  142   Potassium  3.3*  3.8   Chloride  111  108   CO2  26  23   BUN  29.0*  33.0*   Creatinine  0.7  0.8   Glucose  97  76   Calcium  8.1  8.2     Recent Labs      01/08/17   1029   AST (SGOT)  24   ALT  24   Alkaline Phosphatase  67   Protein, Total  6.1   Albumin  2.5*       Current Meds:      Scheduled Meds: PRN Meds:        albuterol-ipratropium 3 mL Nebulization Q4H SCH   amiodarone 200 mg  Oral Daily   amLODIPine 5 mg Oral Daily   atorvastatin 20 mg per G tube QHS   balsam peru-castor oil (VENELEX)  Topical Q12H   bumetanide 0.5 mg per G tube Daily   famotidine 20 mg Oral QHS   glycopyrrolate 1 mg per G tube Daily   lactobacillus/streptococcus 1 capsule per G tube Daily   meropenem 500 mg Intravenous Q8H SCH   metoclopramide 5 mg per G tube Q8H   metoprolol tartrate 25 mg per G tube BID   rivaroxaban 20 mg per G tube Daily with dinner   scopolamine 1 patch Transdermal Q72H       Continuous Infusions:  . dextrose 5 % and 0.9% NaCl 75 mL/hr at 01/10/17 0454      acetaminophen 650 mg Q4H PRN   Or     acetaminophen 650 mg Q4H PRN   naloxone 0.2 mg PRN   ondansetron 4 mg Q6H PRN   Or     ondansetron 4 mg Q6H PRN   oxyCODONE-acetaminophen 1 tablet Q6H PRN   senna 17.2 mg QD PRN         Devra Stare A. Janalyn Rouse, M.D.  01/10/2017  8:15 AM

## 2017-01-10 NOTE — Plan of Care (Signed)
Problem: Safety  Goal: Patient will be free from injury during hospitalization  Outcome: Progressing   01/10/17 0326   Goal/Interventions addressed this shift   Patient will be free from injury during hospitalization  Assess patient's risk for falls and implement fall prevention plan of care per policy;Provide and maintain safe environment;Use appropriate transfer methods;Ensure appropriate safety devices are available at the bedside;Include patient/ family/ care giver in decisions related to safety;Hourly rounding;Provide alternative method of communication if needed (communication boards, writing)   Bed is at the lowest position, call light, bedside table and personal possessions are within patient's reach. Hourly rounding completed and patient's needs were met. Will continue to monitor.

## 2017-01-10 NOTE — Progress Notes (Signed)
INTERNAL MEDICINE PROGRESS NOTE    Progress Note - [name]    Date Time: 01/10/17 11:31 AM  Patient Name: 83 y.o. male with Altered mental status, unspecified altered mental status type      Assessment :    GPC Bacteremia.   Sputum GNR positive.   Unasyn   Malfunction G-tube   Acute on chronic respiratory failure/trached and ventilated   Urinary tract infection   Pneumonia   Encephalopathy, metabolic   Remote history of cervical vertebral fracture with quadriparesis   Chronic atrial fibrillation on anticoagulation   Hypertension      Plan:    Trach and vent care   CVIR consulted for malfunctioning of G-tube.   Oxygenation   High pro-calcitonin noted   Continue vancomycin and Merrem    culture no Growth to date   Gastrointestinal and deep vein thrombosis prophylaxis   Continue home medications   ID consulted reviewed and noted.      Subjective:     Pts chart reviewed since admission on 01/08/17. He has UTI/Sepsis/MDR in the urine.He also evaluated for malfunction of the urine.      Review of Systems:       Physical Exam:     Vitals:    01/10/17 0849   BP:    Pulse:    Resp:    Temp:    SpO2: 99%       Intake/Output Summary (Last 24 hours) at 01/10/17 1131  Last data filed at 01/10/17 0700   Gross per 24 hour   Intake             2215 ml   Output                0 ml   Net             2215 ml     General appearance - Awake /trached and ventilated /on G-tube feeding Eyes - pupils equal and reactive, extraocular eye movements intact  Ears - bilateral TM's and external ear canals normal  Chest - decreased air entry at the bases of the lung, bilateral  Heart -irregular  Abdomen - G-tube is intact  , soft abdomen   Back exam - full range of motion, no tenderness, palpable spasm or pain on motion  Neurological - awake quadriparesis  Musculoskeletal - no joint tenderness, deformity or swelling  Extremities - peripheral pulses normal, no pedal edema, no clubbing or cyanosis  Skin - normal  coloration and turgor, no rashes, no suspicious skin lesions noted    Meds Reviewed: Yes  Medications:     Current Facility-Administered Medications   Medication Dose Route Frequency   . albuterol-ipratropium  3 mL Nebulization Q4H SCH   . amiodarone  200 mg Oral Daily   . amLODIPine  5 mg Oral Daily   . ampicillin-sulbactam  3 g Intravenous Q6H   . atorvastatin  20 mg per G tube QHS   . balsam peru-castor oil (VENELEX)   Topical Q12H   . bumetanide  0.5 mg per G tube Daily   . famotidine  20 mg Oral QHS   . gentamicin  1.7 mg/kg (Adjusted) Intravenous Q8H   . glycopyrrolate  1 mg per G tube Daily   . lactobacillus/streptococcus  1 capsule per G tube Daily   . metoclopramide  5 mg per G tube Q8H   . metoprolol tartrate  25 mg per G tube BID   . rivaroxaban  20 mg per G tube Daily with dinner   . scopolamine  1 patch Transdermal Q72H       PRN Medication:  acetaminophen **OR** acetaminophen, naloxone, ondansetron **OR** ondansetron, oxyCODONE-acetaminophen, senna      Labs:     Results     Procedure Component Value Units Date/Time    MRSA culture [[id number]] Collected:  01/09/17 0423    Specimen:  Body Fluid from Nasal/Throat ASC Admission Updated:  01/10/17 1041    Narrative:       ORDER#: [id number]                                    ORDERED BY: AL KHOURI, SAMI  SOURCE: Nares and Throat                             COLLECTED:  01/09/17 04:23  ANTIBIOTICS AT COLL.:                                RECEIVED :  01/09/17 10:27  Culture MRSA Surveillance                  PRELIM      01/10/17 10:41  01/10/17   Final report to follow      Procalcitonin [[id number]]  (Abnormal) Collected:  01/10/17 0452     Updated:  01/10/17 1021     Procalcitonin 3.9 (H)    Glucose Whole Blood - POCT [[id number]]  (Abnormal) Collected:  01/10/17 0832     Updated:  01/10/17 0906     POCT - Glucose Whole blood 148 (H) mg/dL     GFR [[id number]] Collected:  01/10/17 0452     Updated:  01/10/17 0605     EGFR >60.0    Basic Metabolic Panel  [[id number]]  (Abnormal) Collected:  01/10/17 0452    Specimen:  Blood Updated:  01/10/17 0605     Glucose 97 mg/dL      BUN 53.6 (H) mg/dL      Creatinine 0.7 mg/dL      Calcium 8.1 mg/dL      Sodium 644 mEq/L      Potassium 3.3 (L) mEq/L      Chloride 111 mEq/L      CO2 26 mEq/L      Anion Gap 6.0    Hemolysis index [[id number]] Collected:  01/10/17 0452     Updated:  01/10/17 0605     Hemolysis Index 5    Blood Culture Aerobic/Anaerobic #1 [[id number]] Collected:  01/08/17 1029    Specimen:  Arm from Blood Updated:  01/10/17 0038    Narrative:       [id number] called Micro Results of_pos bld. Results read back [id number], by [id number]  on 01/10/2017 at 00:36  ORDER#: [id number]                                    ORDERED BY: Lehman Prom  SOURCE: Blood arm                                    COLLECTED:  01/08/17 10:29  ANTIBIOTICS AT COLL.:  RECEIVED :  01/08/17 15:23  [id number] called Micro Results of_pos bld. Results read back [id number], by [id number] on 01/10/2017 at 00:36  Culture Blood Aerobic and Anaerobic        PRELIM      01/10/17 00:36   +  01/09/17   No Growth after 1 day/s of incubation.  01/10/17   Anaerobic Blood Culture Positive in 24 to 48 hours             Gram Stain Shows: Gram positive rods             Identification to follow      Glucose Whole Blood - POCT [[id number]]  (Abnormal) Collected:  01/09/17 2216     Updated:  01/09/17 2233     POCT - Glucose Whole blood 134 (H) mg/dL     Blood Culture Aerobic/Anaerobic #2 [[id number]] Collected:  01/08/17 1029    Specimen:  Arm from Blood Updated:  01/09/17 2145    Narrative:       [id number]_ called Micro Results of_pos bld. Results read back [id number], by [id number]  on 01/09/2017 at 21:45  ORDER#: [id number]                                    ORDERED BY: Lehman Prom  SOURCE: Blood arm                                    COLLECTED:  01/08/17 10:29  ANTIBIOTICS AT COLL.:                                RECEIVED :  01/08/17 15:23  [id number]_ called Micro  Results of_pos bld. Results read back [id number], by [id number] on 01/09/2017 at 21:45  Culture Blood Aerobic and Anaerobic        PRELIM      01/09/17 21:45   +  01/09/17   Anaerobic Blood Culture Positive in 24 to 48 hours             Gram Stain Shows: Gram positive rods             Identification to follow      Urine culture [[id number]] Collected:  01/08/17 1129    Specimen:  Urine from Urine, Catheterized, In & Out Updated:  01/09/17 1914    Narrative:       ORDER#: [id number]                                    ORDERED BY: Lehman Prom  SOURCE: Urine, Catheterized, In & Out                COLLECTED:  01/08/17 11:29  ANTIBIOTICS AT COLL.:                                RECEIVED :  01/08/17 15:48  Culture Urine                              PRELIM      01/09/17 19:14   +  01/09/17   >100,000 CFU/ML Gram  negative rod               Further workup to follow including susceptibility testing        Sputum Culture [[id number]] Collected:  01/08/17 1129    Specimen:  Sputum from Tracheal Aspirate Updated:  01/09/17 1712    Narrative:       ORDER#: [id number]                                    ORDERED BY: Lehman Prom  SOURCE: Tracheal Aspirate trach                      COLLECTED:  01/08/17 11:29  ANTIBIOTICS AT COLL.:                                RECEIVED :  01/08/17 16:01  Stain, Gram (Respiratory)                  FINAL       01/08/17 17:23  01/08/17   Few WBC's             Rare WBC's             Moderate Mixed Respiratory Flora  Culture and Gram Stain, Aerobic, RespiratorPRELIM      01/09/17 17:12   +  01/09/17   Heavy growth of mixed upper respiratory flora  01/09/17   Heavy growth of Gram negative rod               Further workup to follow including susceptibility testing        MRSA Culture [[id number]] Collected:  01/08/17 1029    Specimen:  Body Fluid from Nasal/Throat ASC Admission Updated:  01/09/17 1653    Narrative:       ORDER#: [id number]                                    ORDERED BY: Lehman Prom  SOURCE:  Nares and Throat                             COLLECTED:  01/08/17 10:29  ANTIBIOTICS AT COLL.:                                RECEIVED :  01/08/17 15:48  Culture MRSA Surveillance                  FINAL       01/09/17 16:53  01/09/17   Negative for Methicillin Resistant Staph aureus from Nares and             Negative for Methicillin Resistant Staph aureus from Throat      Glucose Whole Blood - POCT [[id number]]  (Abnormal) Collected:  01/09/17 1547     Updated:  01/09/17 1552     POCT - Glucose Whole blood 104 (H) mg/dL     Glucose Whole Blood - POCT [[id number]]  (Abnormal) Collected:  01/09/17 1200     Updated:  01/09/17 1218     POCT - Glucose Whole blood 124 (H) mg/dL             @  [name]@    IRadiology:   Radiological Procedure reviewed.      Sonny Dandy, MD  Spectra link [id number]  [facility] clinic  [phone]  01/10/2017  11:31 AM

## 2017-01-10 NOTE — Plan of Care (Signed)
Problem: Compromised Tissue integrity  Goal: Damaged tissue is healing and protected   01/10/17 1306   Goal/Interventions addressed this shift   Damaged tissue is healing and protected  Monitor/assess Braden scale every shift;Provide wound care per wound care algorithm;Reposition patient every 2 hours and as needed unless able to reposition self;Increase activity as tolerated/progressive mobility;Avoid shearing injuries;Keep intact skin clean and dry;Use incontinence wipes for cleaning urine, stool and caustic drainage. Foley care as needed

## 2017-01-10 NOTE — Progress Notes (Signed)
Blood culture results (Aerobic) positive for gram+ rods. MD aware.

## 2017-01-11 ENCOUNTER — Encounter (INDEPENDENT_AMBULATORY_CARE_PROVIDER_SITE_OTHER): Payer: Self-pay

## 2017-01-11 LAB — BASIC METABOLIC PANEL
Anion Gap: 8 (ref 5.0–15.0)
BUN: 24 mg/dL (ref 9.0–28.0)
CO2: 25 mEq/L (ref 22–29)
Calcium: 8.2 mg/dL (ref 7.9–10.2)
Chloride: 109 mEq/L (ref 100–111)
Creatinine: 0.7 mg/dL (ref 0.7–1.3)
Glucose: 109 mg/dL — ABNORMAL HIGH (ref 70–100)
Potassium: 3.6 mEq/L (ref 3.5–5.1)
Sodium: 142 mEq/L (ref 136–145)

## 2017-01-11 LAB — CBC AND DIFFERENTIAL
Absolute NRBC: 0 10*3/uL
Basophils Absolute Automated: 0.03 10*3/uL (ref 0.00–0.20)
Basophils Automated: 0.3 %
Eosinophils Absolute Automated: 0.07 10*3/uL (ref 0.00–0.70)
Eosinophils Automated: 0.8 %
Hematocrit: 29.6 % — ABNORMAL LOW (ref 42.0–52.0)
Hgb: 9.2 g/dL — ABNORMAL LOW (ref 13.0–17.0)
Immature Granulocytes Absolute: 0.07 10*3/uL — ABNORMAL HIGH
Immature Granulocytes: 0.8 %
Lymphocytes Absolute Automated: 0.43 10*3/uL — ABNORMAL LOW (ref 0.50–4.40)
Lymphocytes Automated: 4.8 %
MCH: 26.4 pg — ABNORMAL LOW (ref 28.0–32.0)
MCHC: 31.1 g/dL — ABNORMAL LOW (ref 32.0–36.0)
MCV: 85.1 fL (ref 80.0–100.0)
MPV: 11.2 fL (ref 9.4–12.3)
Monocytes Absolute Automated: 0.92 10*3/uL (ref 0.00–1.20)
Monocytes: 10.3 %
Neutrophils Absolute: 7.41 10*3/uL (ref 1.80–8.10)
Neutrophils: 83 %
Nucleated RBC: 0 /100 WBC (ref 0.0–1.0)
Platelets: 220 10*3/uL (ref 140–400)
RBC: 3.48 10*6/uL — ABNORMAL LOW (ref 4.70–6.00)
RDW: 17 % — ABNORMAL HIGH (ref 12–15)
WBC: 8.93 10*3/uL (ref 3.50–10.80)

## 2017-01-11 LAB — GLUCOSE WHOLE BLOOD - POCT
Whole Blood Glucose POCT: 126 mg/dL — ABNORMAL HIGH (ref 70–100)
Whole Blood Glucose POCT: 124 mg/dL — ABNORMAL HIGH (ref 70–100)
Whole Blood Glucose POCT: 129 mg/dL — ABNORMAL HIGH (ref 70–100)
Whole Blood Glucose POCT: 143 mg/dL — ABNORMAL HIGH (ref 70–100)
Whole Blood Glucose POCT: 107 mg/dL — ABNORMAL HIGH (ref 70–100)

## 2017-01-11 LAB — GENTAMICIN LEVEL, RANDOM: Gentamicin Random: 2.5 ug/mL

## 2017-01-11 LAB — HEMOLYSIS INDEX: Hemolysis Index: 11 (ref 0–18)

## 2017-01-11 LAB — GFR: EGFR: >60

## 2017-01-11 MED ORDER — SODIUM CHLORIDE 0.9 % IV SOLN
400.0000 mg | Freq: Once | INTRAVENOUS | Status: AC
Start: 2017-01-11 — End: 2017-01-12
  Administered 2017-01-12: 400 mg via INTRAVENOUS
  Filled 2017-01-11: qty 10

## 2017-01-11 NOTE — Progress Notes (Signed)
INTERNAL MEDICINE PROGRESS NOTE    Progress Note - [name]    Date Time: 01/11/17 10:44 AM  Patient Name: 83 y.o. male with Altered mental status, unspecified altered mental status type      Assessment :     GPR Bacteremia.  Pneumonia/Serrata/Pseudomonas  Malfunction G-tube  Encephalopathy, metabolic  AMS.  Tracheostomy/Vent.  Acute on chronic respiratory failure/trached and ventilated  HTN.  CAF    Plan:        CVIR consulted for malfunctioning of G-tubeGI prophylaxis   ID recommendation reviewed.   DVT prophylaxis   Discussed plan of care with patient and family.   Discussed plan of care with nurses.   Discussed case with consultants.    Subjective:     Pts events ;ast 24 hours reviewed.    Review of Systems:     General ROS: No fatigue, no generalized aches or pains.   HEENT: No eye pain , redness or discharge. No nasal congestion  Respiratory ROS: No cough, SOB.  Cardiovascular ROS: No chest pain, palpitation ,orthopnea or PND  Gastrointestinal ROS: No abdominal pain, diarrhea, nausea or vomiting  Genito-Urinary ROS: No hematuria, dysuria, urgency or frequency.  Musculoskeletal ROS: No myalgia or arthralgia.  Neurological ROS: No dizziness, headache or weakness. No aphasia or slurred speech    Physical Exam:     Vitals:    01/11/17 0820   BP:    Pulse:    Resp:    Temp:    SpO2: 99%       Intake/Output Summary (Last 24 hours) at 01/11/17 1044  Last data filed at 01/11/17 0600   Gross per 24 hour   Intake             2070 ml   Output                0 ml   Net             2070 ml       General appearance- Awake /trached and ventilated /on G-tube feeding  Chest- decreased air entry at the bases of the lung, bilateral  Heart-irregular  Abdomen- G-tube is intact , softabdomen   Back exam- full range of motion, no tenderness, palpable spasm or pain on motion  Neurological - awake quadriparesis  Musculoskeletal- no joint tenderness, deformity or swelling  Extremities- peripheral pulses  normal, no pedal edema, no clubbing or cyanosis  Skin- normal coloration and turgor, no rashes, no suspicious skin lesions noted        Meds Reviewed: Yes  Medications:   Medications:   Scheduled Meds: PRN Meds:        albuterol-ipratropium 3 mL Nebulization Q4H SCH   amiodarone 200 mg Oral Daily   amLODIPine 5 mg Oral Daily   ampicillin-sulbactam 3 g Intravenous Q6H   atorvastatin 20 mg per G tube QHS   balsam peru-castor oil (VENELEX)  Topical Q12H   bumetanide 0.5 mg per G tube Daily   famotidine 20 mg Oral QHS   gentamicin 400 mg Intravenous Once   glycopyrrolate 1 mg per G tube Daily   lactobacillus/streptococcus 1 capsule per G tube Daily   metoclopramide 5 mg per G tube Q8H   metoprolol tartrate 25 mg per G tube BID   rivaroxaban 20 mg per G tube Daily with dinner   scopolamine 1 patch Transdermal Q72H         Continuous Infusions:   . dextrose 5 % and  0.9% NaCl 75 mL/hr at 01/10/17 0454          acetaminophen 650 mg Q4H PRN   Or     acetaminophen 650 mg Q4H PRN   naloxone 0.2 mg PRN   ondansetron 4 mg Q6H PRN   Or     ondansetron 4 mg Q6H PRN   oxyCODONE-acetaminophen 1 tablet Q6H PRN   senna 17.2 mg QD PRN             Labs:   CHEMISTRY:     Recent Labs  Lab 01/11/17  0438 01/10/17  0452 01/09/17  0347 01/08/17  1029   Glucose 109* 97 76 179*   BUN 24.0 29.0* 33.0* 32.0*   Creatinine 0.7 0.7 0.8 0.8   Calcium 8.2 8.1 8.2 9.1   Sodium 142 143 142 140   Potassium 3.6 3.3* 3.8 4.4   Chloride 109 111 108 101   CO2 25 26 23  30*   Albumin  --   --   --  2.5*   AST (SGOT)  --   --   --  24   ALT  --   --   --  24   Bilirubin, Total  --   --   --  0.4   Alkaline Phosphatase  --   --   --  67           Invalid input(s):  AMYLASE    Recent Labs  Lab 01/09/17  0347 01/08/17  1910 01/08/17  1029   Troponin I 0.04 0.10* 0.03         @LABRCNTIP (TSH:3,FRET3:3,FREET4):3)@    HEMATOLOGY:    Recent Labs  Lab 01/11/17  0438 01/09/17  0347 01/08/17  1029   WBC 8.93 15.57* 26.48*   Hgb 9.2* 9.1* 10.5*   Hematocrit 29.6* 29.3*  34.7*   MCV 85.1 85.2 88.3   MCH 26.4* 26.5* 26.7*   MCHC 31.1* 31.1* 30.3*   Platelets 220 268 349           Invalid input(s):  APTT  Invalid input(s): ARTERIAL BLOOD GAS    URINALYSIS:    Recent Labs  Lab 01/08/17  1129   Urine Type Clean Catch   Color, UA Amber*   Clarity, UA Sl Cloudy*   Specific Gravity UA 1.017   Urine pH 6.0   Nitrite, UA Negative   Ketones UA Negative   Urobilinogen, UA Negative   Bilirubin, UA Negative   Blood, UA Large*   RBC, UA TNTC*   WBC, UA TNTC*       MICROBIOLOGY:  Microbiology Results     Procedure Component Value Units Date/Time    Blood Culture Aerobic/Anaerobic #1 [[id number]] Collected:  01/08/17 1029    Specimen:  Arm from Blood Updated:  01/10/17 0038    Narrative:       [id number] called Micro Results of_pos bld. Results read back [id number], by [id number]  on 01/10/2017 at 00:36  ORDER#: [id number]                                    ORDERED BY: Lehman Prom  SOURCE: Blood arm                                    COLLECTED:  01/08/17 10:29  ANTIBIOTICS AT COLL.:  RECEIVED :  01/08/17 15:23  [id number] called Micro Results of_pos bld. Results read back [id number], by [phone] on 01/10/2017 at 00:36  Culture Blood Aerobic and Anaerobic        PRELIM      01/10/17 00:36   +  01/09/17   No Growth after 1 day/s of incubation.  01/10/17   Anaerobic Blood Culture Positive in 24 to 48 hours             Gram Stain Shows: Gram positive rods             Identification to follow      Blood Culture Aerobic/Anaerobic #2 [[id number]] Collected:  01/08/17 1029    Specimen:  Arm from Blood Updated:  01/10/17 1519    Narrative:       [id number] called Micro Results of_pos bld. Results read back [id number], by [id number]  on 01/09/2017 at 21:45  ORDER#: [id number]                                    ORDERED BY: Lehman Prom  SOURCE: Blood arm                                    COLLECTED:  01/08/17 10:29  ANTIBIOTICS AT COLL.:                                RECEIVED :  01/08/17 15:23  [id number]_  called Micro Results of_pos bld. Results read back [id number], by [id number] on 01/09/2017 at 21:45  Culture Blood Aerobic and Anaerobic        PRELIM      01/10/17 15:19   +  01/09/17   Anaerobic Blood Culture Positive in 24 to 48 hours             Gram Stain Shows: Gram positive rods             Identification to follow  01/10/17   Culture requires further incubation, results to follow      MRSA Culture [[id number]] Collected:  01/08/17 1029    Specimen:  Body Fluid from Nasal/Throat ASC Admission Updated:  01/09/17 1653    Narrative:       ORDER#: [id number]                                    ORDERED BY: Lehman Prom  SOURCE: Nares and Throat                             COLLECTED:  01/08/17 10:29  ANTIBIOTICS AT COLL.:                                RECEIVED :  01/08/17 15:48  Culture MRSA Surveillance                  FINAL       01/09/17 16:53  01/09/17   Negative for Methicillin Resistant Staph aureus from Nares and             Negative for Methicillin Resistant Staph aureus from Throat  MRSA culture [[id number]] Collected:  01/09/17 0423    Specimen:  Body Fluid from Nasal/Throat ASC Admission Updated:  01/11/17 0950    Narrative:       ORDER#: [id number]                                    ORDERED BY: AL KHOURI, SAMI  SOURCE: Nares and Throat                             COLLECTED:  01/09/17 04:23  ANTIBIOTICS AT COLL.:                                RECEIVED :  01/09/17 10:27  Culture MRSA Surveillance                  FINAL       01/11/17 09:50   +  01/11/17   Positive for Methicillin Resistant Staph aureus from Nares             Negative for Methicillin Resistant Staph aureus from Throat      Sputum Culture [[id number]] Collected:  01/08/17 1129    Specimen:  Sputum from Tracheal Aspirate Updated:  01/10/17 1727    Narrative:       ORDER#: [id number]                                    ORDERED BY: Lehman Prom  SOURCE: Tracheal Aspirate trach                      COLLECTED:  01/08/17 11:29  ANTIBIOTICS AT  COLL.:                                RECEIVED :  01/08/17 16:01  Stain, Gram (Respiratory)                  FINAL       01/08/17 17:23  01/08/17   Few WBC's             Rare WBC's             Moderate Mixed Respiratory Flora  Culture and Gram Stain, Aerobic, RespiratorPRELIM      01/10/17 17:27   +  01/09/17   Heavy growth of mixed upper respiratory flora  01/10/17   Heavy growth of Serratia marcescens               Further workup to follow including susceptibility testing    01/10/17   Heavy growth of Pseudomonas aeruginosa               Further workup to follow including susceptibility testing        Urine culture [[id number]] Collected:  01/08/17 1129    Specimen:  Urine from Urine, Catheterized, In & Out Updated:  01/10/17 1517    Narrative:       ORDER#: [id number]                                    ORDERED BY: Lehman Prom  SOURCE: Urine, Catheterized, In & Out                COLLECTED:  01/08/17 11:29  ANTIBIOTICS AT COLL.:                                RECEIVED :  01/08/17 15:48  Culture Urine                              FINAL       01/10/17 15:17   +  01/10/17   >100,000 CFU/ML MDR Proteus mirabilis               Cefazolin results predict results for the oral agents             cefuroxime axetil, cephalexin, cefdinir, cefpodoxime, and             cefprozil when used for therapy of uncomplicated UTIs due to             E.coli, K. pneumonia, and P. mirabilis. CLSI M100 S25             This multidrug resistant (MDR) Enterobacteriaceae is resistant             to ceftriaxone and may not respond optimally to B-lactam             antibiotics (excluding carbapenems).             [facility] Antim[facility]committee June 2015    _____________________________________________________________________________                                MDR P.mirabilis   ANTIBIOTICS                     MIC  INTRP      _____________________________________________________________________________  Amikacin                         >32    R        Amoxicillin/CA                 <=4/2   S        Ampicillin                      >16    R        Aztreonam                       >16    R        Cefazolin                       >16    R        Cefepime                        >16    R        Ceftazidime                     >16    R        Ceftriaxone                     >  32    R        Ciprofloxacin                   >2     R        Ertapenem                     <=0.25   S        Gentamicin                      >8     R        Levofloxacin                    >4     R        Nitrofurantoin                  >64    R  D1    Piperacillin/Tazobactam        <=2/4   S        Tetracycline                    >8     R        Tobramycin                      >8     R        Trimethoprim/Sulfamethoxazole  >2/38   R          -----DRUG COMMENTS----------    D1:  Nitrofurantoin should only be used for the treatment of         uncomplicated cystitis.         [facility]im[facility] June 2015  _____________________________________________________________________________            S=SUSCEPTIBLE     I=INTERMEDIATE     R=RESISTANT                            N/S=NON-SUSCEPTIBLE  _____________________________________________________________________________              IRadiology:   Radiological Procedure reviewed.  Xr Abdomen Ap    Result Date: 12/16/2016  Nonspecific bowel gas pattern Lorinda Creed, MD 12/16/2016 7:13 PM    Ct Head Wo Contrast    Result Date: 01/08/2017    No acute intracranial abnormality. Wyatt Portela, MD 01/08/2017 11:16 AM    Xr Chest Ap Portable    Result Date: 01/10/2017   Technically limited portable study. Increased bilateral pleural effusions and bibasilar opacification. Heron Nay, MD 01/10/2017 8:01 AM    Xr Chest  Ap Portable    Result Date: 01/08/2017    Interval improvement of the vascular congestion and pulmonary edema and decrease in size of the pleural effusions. Heron Nay, MD 01/08/2017 11:29 AM    Xr Chest Ap  Portable    Result Date: 12/25/2016   Overall stable exam with pulmonary vascular engorgement and layering bilateral pleural effusions. Stable bibasilar opacities in keeping with some combination of atelectasis, layering pleural effusion, and/or lower lobe infiltrates. Gustavus Messing, MD 12/25/2016 7:51 AM    Xr Chest Ap Portable    Result Date: 12/24/2016  1. Worsening pulmonary vascular congestion. Enlargement of bilateral pleural effusions. 2. Consolidation throughout the lung bases which may be secondary to layering pleural fluid,  subsegmental atelectatic changes and/or infiltrates.  Fonnie Mu, MD 12/24/2016 8:41 AM    Xr Chest Ap Portable    Result Date: 12/23/2016   Stable multifocal airspace disease, most likely CHF/fluid overload although nonspecific, with continued moderate bilateral pleural effusions and underlying lower lobe consolidation likely atelectasis. No change from XR CHEST AP PORTABLE with report dated 12/22/2016 9:53 AM. Laurena Slimmer, MD 12/23/2016 8:15 AM    Xr Chest Ap Portable    Result Date: 12/22/2016   Improvement in pulmonary edema with continued moderate bilateral pleural effusions and underlying lower lobe consolidation likely atelectasis. Laurena Slimmer, MD 12/22/2016 9:53 AM    Xr Chest Ap Portable    Result Date: 12/21/2016   Findings consistent with volume overload, overall stable.  Georgana Curio, MD 12/21/2016 7:51 AM    Xr Chest Ap Portable    Result Date: 12/20/2016  1. There is no overall change in the patients pleural effusion and hypoventilation bilaterally. 2. PICC line is noted in position without complication. Charlene Brooke, MD 12/20/2016 7:52 AM    Xr Chest Ap Portable    Result Date: 12/19/2016   Stable examination with bilateral hazy parenchymal opacification and pleural effusions likely due to CHF/fluid overload No change from XR CHEST AP PORTABLE with report dated 12/18/2016 8:38 AM. Laurena Slimmer, MD 12/19/2016 8:17 AM    Xr Chest Ap Portable    Result Date: 12/18/2016   Stable  examination with bilateral hazy parenchymal opacification and pleural effusions. Cannot exclude underlying pulmonary edema. Mitali  Bapna, MD 12/18/2016 8:38 AM    Xr Chest Ap Portable    Result Date: 12/17/2016   Good position of intravascular catheter. No pneumothorax. Stable infiltrates and effusions most compatible with pulmonary vascular congestion. Kinnie Feil, MD 12/17/2016 1:58 PM    Xr Chest Ap Portable    Result Date: 12/17/2016   No significant interval change in bilateral pleural effusions and bibasilar opacities in keeping with compressive atelectasis, with concurrent infiltrates not excluded. Gustavus Messing, MD 12/17/2016 8:19 AM    Xr Chest Ap Portable    Result Date: 12/16/2016   No significant change with right larger than left pleural effusions and bibasilar atelectasis versus airspace disease.Filbert Schilder, MD 12/16/2016 8:30 AM    Xr Chest Ap Portable    Result Date: 12/15/2016   Moderate right and small left pleural effusions. Right greater than left basilar opacification, improved. Mitali  Bapna, MD 12/15/2016 7:27 AM    Xr Chest  Ap Portable    Result Date: 12/14/2016  1. Moderate volume right and small volume left pleural effusions. 2. Density throughout the lung bases which may represent atelectatic changes or infiltrates. A follow-up PA and lateral chest is recommended when the patient is stable Fonnie Mu, MD 12/14/2016 1:21 PM    US Venous Up Extrem Duplex Dopp Uni Right    Addendum Date: 12/20/2016    IMPRESSION: Thrombus within one of the paired basilic vein branches of the distal aspect of the right upper arm, recommend repeat ultrasound in 10-14 days to ensure no propagation into the axillary vein. Anticoagulation is NOT warranted at this time. Ronalee Red, MD 12/20/2016 3:08 PM    Result Date: 12/20/2016   Thrombus within one of the paired basilic vein branches of the distal aspect of the right upper arm, recommend repeat ultrasound in 10-14 days to ensure no propagation into the  axillary vein. Anticoagulation is now  warranted at this time. This study was read in conjunction with Dr. Alphonzo Grieve. Ronalee Red, MD 12/19/2016 1:47 PM    G,j,g/j Tube Replacement    Result Date: 01/10/2017    Sinogram of the old gastrostomy tube tract allowing recanalization and redilation with eventual replacement of 20 French MIC gastrostomy catheter in good position the stomach. The new catheter may be used immediately. Dara Lords, MD 01/10/2017 11:05 AM      Leonia Corona Karenann Cai, MD  01/11/2017  10:44 AM

## 2017-01-11 NOTE — Plan of Care (Signed)
Problem: Safety  Goal: Patient will be free from injury during hospitalization  Outcome: Progressing  Safe environment maintained. Pt is at moderate fall risk. Bed rails up x 3. Floor mats present. Call bell within reach. Pt is successfully able to communicate via writing and mouthing words. Pt verbalizes understanding of fall risk assessment and the importance of using the call bell prior to activity.    01/11/17 1610   Goal/Interventions addressed this shift   Patient will be free from injury during hospitalization  Assess patient's risk for falls and implement fall prevention plan of care per policy;Provide and maintain safe environment;Use appropriate transfer methods;Hourly rounding;Assess for patients risk for elopement and implement Elopement Risk Plan per policy;Provide alternative method of communication if needed [employer], writing);Include patient/ family/ care giver in decisions related to safety       Problem: Inadequate Gas Exchange  Goal: Provide mechanical and oxygen support to facilitate gas exchange  Outcome: Progressing  Pt adequately oxygenating via trach ventilator on minimal settings. FiO2 at 35%, peep 5, rate of 18 and volume 500. Lungs sound intermittently coarse prior to inline suctioning then clear and diminished. Tan, thick secretions noted in tubing. Saturations > 92%. Pt repositioned for maximum ventilatory efficiency. Will monitor.

## 2017-01-11 NOTE — Progress Notes (Signed)
Infectious Diseases & Tropical Medicine  Progress Note    01/11/2017   [name] VHQ:[id number],LKG:[id number] is a 83 y.o. male,       Assessment:      Sepsis.   Gram-positive rods bacteremia (2/2)-awaiting identification   Altered mental status resolved.   Chest x-ray-increased bilateral pleural effusions and bibasilar consolidation (01/10/2017)   Sputum culture- Serratia marcescens and Pseudomonas, sensitivities pending   Recently treated MDR organisms causing pneumonia   Pro-calcitonin level-6.7 (01/09/2017).   Pro-calcitonin level-3.9 (01/10/2017)   Urine culture-MDR Proteus mirabilis, sensitive to Unasyn   Patient without Foley catheter.   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   Status post tracheostomy andG-tube 6placement   Resident of [name]   Leukocytosis resolved    Plan:      Continue Unasyn and gentamicin.   Pharmacy monitoring gentamicin levels   Await final sputum cultures   Follow-up chest x-ray.   Continue probiotics.   Monitor electrolytes and renal functions closely.   Monitor clinically.   Discussed with patient in detail   Discussed with pharmacy.    ROS:     General: Fever resolved, no chills, no rigor, awake and alert, wants to go back to [facility]  HEENT: no neck pain, no throat pain, hard of hearing, tracheostomy in place  Endocrine:  no fatigue, no night sweats  Respiratory: Occasional cough, shortness of breath, or wheezing   Cardiovascular: no chest pain   Gastrointestinal: no abdominal pain,no N/V/D  Genito-Urinary: no dysuria or hematuria   Musculoskeletal: no edema  Neurological: c/o generalized weakness   Dermatological: no rash, no ulcer    Physical Examination:     Blood pressure 104/58, pulse (!) 106, temperature 99.6 F (37.6 C), temperature source Axillary, resp. rate (!) 5, height 1.854 m (6' 0.99"), weight 100.7 kg (222 lb), SpO2 99 %.     General Appearance: Comfortable, and  in no acute distress.   HEENT: Pupils are equal, round, and reactive to light.    Lungs: Scattered rhonchi   Heart:  Tachycardia   Chest: Symmetric chest wall expansion.    Abdomen: soft ,non tender,no hepatosplenomegaly,good bowel sounds   Neurological: Paraplegia   Extremities: No edema    Laboratory And Diagnostic Studies:     Recent Labs      01/11/17   0438  01/09/17   0347   WBC  8.93  15.57*   Hgb  9.2*  9.1*   Hematocrit  29.6*  29.3*   Platelets  220  268     Recent Labs      01/11/17   0438  01/10/17   0452   Sodium  142  143   Potassium  3.6  3.3*   Chloride  109  111   CO2  25  26   BUN  24.0  29.0*   Creatinine  0.7  0.7   Glucose  109*  97   Calcium  8.2  8.1     Recent Labs      01/08/17   1029   AST (SGOT)  24   ALT  24   Alkaline Phosphatase  67   Protein, Total  6.1   Albumin  2.5*       Current Meds:      Scheduled Meds: PRN Meds:        albuterol-ipratropium 3 mL Nebulization Q4H SCH   amiodarone 200 mg Oral Daily  amLODIPine 5 mg Oral Daily   ampicillin-sulbactam 3 g Intravenous Q6H   atorvastatin 20 mg per G tube QHS   balsam peru-castor oil (VENELEX)  Topical Q12H   bumetanide 0.5 mg per G tube Daily   famotidine 20 mg Oral QHS   glycopyrrolate 1 mg per G tube Daily   lactobacillus/streptococcus 1 capsule per G tube Daily   metoclopramide 5 mg per G tube Q8H   metoprolol tartrate 25 mg per G tube BID   rivaroxaban 20 mg per G tube Daily with dinner   scopolamine 1 patch Transdermal Q72H       Continuous Infusions:  . dextrose 5 % and 0.9% NaCl 75 mL/hr at 01/10/17 0454      acetaminophen 650 mg Q4H PRN   Or     acetaminophen 650 mg Q4H PRN   naloxone 0.2 mg PRN   ondansetron 4 mg Q6H PRN   Or     ondansetron 4 mg Q6H PRN   oxyCODONE-acetaminophen 1 tablet Q6H PRN   senna 17.2 mg QD PRN         Kanyia Heaslip A. Janalyn Rouse, M.D.  01/11/2017  9:10 AM

## 2017-01-11 NOTE — Progress Notes (Signed)
MCR Fx PNA, 30D READMIT    pt from [facility], may not be able to return if requiring isolation at [place].    Pt cont on IV ABT, MDR in urine.    DCP to return to [facility]    Transport via ambulance    Voicemail left for [name], pt's dtr and requested return call to discuss DCP.    Madie Reno, RN  Clinical Case Manager I  [phone]

## 2017-01-11 NOTE — Progress Notes (Signed)
TCM Medicare Navigator    Patient currently admitted to [facility] under inpatient status. Medicare Focus Navigator will continue to monitor patient status for 30 days following index admission which occurred dates 12/14/2016 to 12/27/2016.      [name] BS   TCM Medicare Focus navigator   [phone]

## 2017-01-11 NOTE — Plan of Care (Signed)
Problem: Inadequate Gas Exchange  Goal: Adequate oxygenation and improved ventilation  Outcome: Progressing   01/11/17 1543   Goal/Interventions addressed this shift   Adequate oxygenation and improved ventilation Assess lung sounds;Monitor SpO2 and treat as needed;Provide mechanical and oxygen support to facilitate gas exchange;Position for maximum ventilatory efficiency;Plan activities to conserve energy: plan rest periods;Increase activity as tolerated/progressive mobility;Consult/collaborate with Respiratory Therapy   Patient has not had difficulty with ventilator. To signs of distress have been observed. SPO2 level has been satisfactory.    Problem: Artificial Airway  Goal: Tracheostomy will be maintained  Outcome: Progressing   01/11/17 1543   Goal/Interventions addressed this shift   Tracheostomy will be maintained Suction secretions as needed;Keep head of bed at 30 degrees, unless contraindicated;Encourage/perform oral hygiene as appropriate;Utilize tracheostomy securing device;Support ventilator tubing to avoid pressure from drag of tubing;Tracheostomy care every shift and as needed;Maintain surgical airway kit or tracheostomy tray at bedside;Keep additional tracheostomy tube of the same size and one size smaller at bedside   Patient's tracheostomy has been maintained. Airway is patent. Minimal secretions this shift. Trach care to be performed during next assessment.

## 2017-01-12 LAB — GENTAMICIN LEVEL, RANDOM
Gentamicin Random: 11.6 ug/mL
Gentamicin Random: 6.1 ug/mL

## 2017-01-12 LAB — GLUCOSE WHOLE BLOOD - POCT
Whole Blood Glucose POCT: 114 mg/dL — ABNORMAL HIGH (ref 70–100)
Whole Blood Glucose POCT: 117 mg/dL — ABNORMAL HIGH (ref 70–100)
Whole Blood Glucose POCT: 121 mg/dL — ABNORMAL HIGH (ref 70–100)
Whole Blood Glucose POCT: 134 mg/dL — ABNORMAL HIGH (ref 70–100)

## 2017-01-12 MED ORDER — PIPERACILLIN-TAZOBACTAM 4.5 GM IN NS 100 ML IVPB (CNR)
4.5000 g | Freq: Four times a day (QID) | INTRAVENOUS | Status: DC
Start: 2017-01-12 — End: 2017-01-15
  Administered 2017-01-12 – 2017-01-15 (×12): 4.5 g via INTRAVENOUS
  Filled 2017-01-12 (×11): qty 100

## 2017-01-12 MED ORDER — ALBUTEROL-IPRATROPIUM 2.5-0.5 (3) MG/3ML IN SOLN
3.0000 mL | Freq: Four times a day (QID) | RESPIRATORY_TRACT | Status: DC
Start: 2017-01-12 — End: 2017-01-30
  Administered 2017-01-12 – 2017-01-30 (×71): 3 mL via RESPIRATORY_TRACT
  Filled 2017-01-12 (×71): qty 3

## 2017-01-12 NOTE — Progress Notes (Signed)
INTERNAL MEDICINE PROGRESS NOTE    Progress Note - [name]    Date Time: 01/12/17 7:17 PM  Patient Name: 83 y.o. male with Altered mental status, unspecified altered mental status type      Assessment :     GPR Bacteremia.  Pneumonia/Serrata/Pseudomonas  Malfunction G-tube  Encephalopathy, metabolic  AMS.  Tracheostomy/Vent.  Acute on chronic respiratory failure/trached and ventilated  HTN.  CAF    Plan:        On Zosyn.   Awaiting identification of the blood culture.   CVIR consulted for malfunctioning of G-tubeGI prophylaxis   ID recommendation reviewed.   DVT prophylaxis   Discussed plan of care with patient and family.   Discussed plan of care with nurses.   Discussed case with consultants.    Subjective:     Pts events ;ast 24 hours reviewed.    Review of Systems:     General ROS: No fatigue, no generalized aches or pains.   HEENT: No eye pain , redness or discharge. No nasal congestion  Respiratory ROS: No cough, SOB.  Cardiovascular ROS: No chest pain, palpitation ,orthopnea or PND  Gastrointestinal ROS: No abdominal pain, diarrhea, nausea or vomiting  Genito-Urinary ROS: No hematuria, dysuria, urgency or frequency.  Musculoskeletal ROS: No myalgia or arthralgia.  Neurological ROS: No dizziness, headache or weakness. No aphasia or slurred speech    Physical Exam:     Vitals:    01/12/17 1800   BP:    Pulse: (!) 103   Resp: 19   Temp:    SpO2: 98%       Intake/Output Summary (Last 24 hours) at 01/12/17 1308  Last data filed at 01/12/17 1500   Gross per 24 hour   Intake             1720 ml   Output                0 ml   Net             1720 ml       General appearance- Awake /trached and ventilated /on G-tube feeding  Chest- decreased air entry at the bases of the lung, bilateral  Heart-irregular  Abdomen- G-tube is intact , softabdomen   Back exam- full range of motion, no tenderness, palpable spasm or pain on motion  Neurological - awake quadriparesis  Musculoskeletal- no joint  tenderness, deformity or swelling  Extremities- peripheral pulses normal, no pedal edema, no clubbing or cyanosis  Skin- normal coloration and turgor, no rashes, no suspicious skin lesions noted        Meds Reviewed: Yes  Medications:   Medications:   Scheduled Meds: PRN Meds:        albuterol-ipratropium 3 mL Nebulization Q6H   amiodarone 200 mg Oral Daily   amLODIPine 5 mg Oral Daily   atorvastatin 20 mg per G tube QHS   balsam peru-castor oil (VENELEX)  Topical Q12H   bumetanide 0.5 mg per G tube Daily   famotidine 20 mg Oral QHS   glycopyrrolate 1 mg per G tube Daily   lactobacillus/streptococcus 1 capsule per G tube Daily   metoclopramide 5 mg per G tube Q8H   metoprolol tartrate 25 mg per G tube BID   piperacillin-tazobactam 4.5 g Intravenous Q6H   rivaroxaban 20 mg per G tube Daily with dinner   scopolamine 1 patch Transdermal Q72H         Continuous Infusions:  acetaminophen 650 mg Q4H PRN   Or     acetaminophen 650 mg Q4H PRN   naloxone 0.2 mg PRN   ondansetron 4 mg Q6H PRN   Or     ondansetron 4 mg Q6H PRN   oxyCODONE-acetaminophen 1 tablet Q6H PRN   senna 17.2 mg QD PRN             Labs:   CHEMISTRY:     Recent Labs  Lab 01/11/17  0438 01/10/17  0452 01/09/17  0347 01/08/17  1029   Glucose 109* 97 76 179*   BUN 24.0 29.0* 33.0* 32.0*   Creatinine 0.7 0.7 0.8 0.8   Calcium 8.2 8.1 8.2 9.1   Sodium 142 143 142 140   Potassium 3.6 3.3* 3.8 4.4   Chloride 109 111 108 101   CO2 25 26 23  30*   Albumin  --   --   --  2.5*   AST (SGOT)  --   --   --  24   ALT  --   --   --  24   Bilirubin, Total  --   --   --  0.4   Alkaline Phosphatase  --   --   --  67           Invalid input(s):  AMYLASE    Recent Labs  Lab 01/09/17  0347 01/08/17  1910 01/08/17  1029   Troponin I 0.04 0.10* 0.03         @LABRCNTIP (TSH:3,FRET3:3,FREET4):3)@    HEMATOLOGY:    Recent Labs  Lab 01/11/17  0438 01/09/17  0347 01/08/17  1029   WBC 8.93 15.57* 26.48*   Hgb 9.2* 9.1* 10.5*   Hematocrit 29.6* 29.3* 34.7*   MCV 85.1 85.2 88.3    MCH 26.4* 26.5* 26.7*   MCHC 31.1* 31.1* 30.3*   Platelets 220 268 349           Invalid input(s):  APTT  Invalid input(s): ARTERIAL BLOOD GAS    URINALYSIS:    Recent Labs  Lab 01/08/17  1129   Urine Type Clean Catch   Color, UA Amber*   Clarity, UA Sl Cloudy*   Specific Gravity UA 1.017   Urine pH 6.0   Nitrite, UA Negative   Ketones UA Negative   Urobilinogen, UA Negative   Bilirubin, UA Negative   Blood, UA Large*   RBC, UA TNTC*   WBC, UA TNTC*       MICROBIOLOGY:  Microbiology Results     Procedure Component Value Units Date/Time    Blood Culture Aerobic/Anaerobic #1 [[id number]] Collected:  01/08/17 1029    Specimen:  Arm from Blood Updated:  01/10/17 0038    Narrative:       [id number] called Micro Results of_pos bld. Results read back [id number], by [id number]  on 01/10/2017 at 00:36  ORDER#: [id number]                                    ORDERED BY: Lehman Prom  SOURCE: Blood arm                                    COLLECTED:  01/08/17 10:29  ANTIBIOTICS AT COLL.:  RECEIVED :  01/08/17 15:23  [id number] called Micro Results of_pos bld. Results read back [id number], by [phone] on 01/10/2017 at 00:36  Culture Blood Aerobic and Anaerobic        PRELIM      01/10/17 00:36   +  01/09/17   No Growth after 1 day/s of incubation.  01/10/17   Anaerobic Blood Culture Positive in 24 to 48 hours             Gram Stain Shows: Gram positive rods             Identification to follow      Blood Culture Aerobic/Anaerobic #2 [[id number]] Collected:  01/08/17 1029    Specimen:  Arm from Blood Updated:  01/10/17 1519    Narrative:       [id number]_ called Micro Results of_pos bld. Results read back [id number], by [id number]  on 01/09/2017 at 21:45  ORDER#: [id number]                                    ORDERED BY: Lehman Prom  SOURCE: Blood arm                                    COLLECTED:  01/08/17 10:29  ANTIBIOTICS AT COLL.:                                RECEIVED :  01/08/17 15:23  [id number]_ called Micro Results of_pos bld.  Results read back [id number], by [id number] on 01/09/2017 at 21:45  Culture Blood Aerobic and Anaerobic        PRELIM      01/10/17 15:19   +  01/09/17   Anaerobic Blood Culture Positive in 24 to 48 hours             Gram Stain Shows: Gram positive rods             Identification to follow  01/10/17   Culture requires further incubation, results to follow      MRSA Culture [[id number]] Collected:  01/08/17 1029    Specimen:  Body Fluid from Nasal/Throat ASC Admission Updated:  01/09/17 1653    Narrative:       ORDER#: [id number]                                    ORDERED BY: Lehman Prom  SOURCE: Nares and Throat                             COLLECTED:  01/08/17 10:29  ANTIBIOTICS AT COLL.:                                RECEIVED :  01/08/17 15:48  Culture MRSA Surveillance                  FINAL       01/09/17 16:53  01/09/17   Negative for Methicillin Resistant Staph aureus from Nares and             Negative for Methicillin Resistant Staph aureus from Throat  MRSA culture [[id number]] Collected:  01/09/17 0423    Specimen:  Body Fluid from Nasal/Throat ASC Admission Updated:  01/11/17 0950    Narrative:       ORDER#: [id number]                                    ORDERED BY: AL KHOURI, SAMI  SOURCE: Nares and Throat                             COLLECTED:  01/09/17 04:23  ANTIBIOTICS AT COLL.:                                RECEIVED :  01/09/17 10:27  Culture MRSA Surveillance                  FINAL       01/11/17 09:50   +  01/11/17   Positive for Methicillin Resistant Staph aureus from Nares             Negative for Methicillin Resistant Staph aureus from Throat      Sputum Culture [[id number]] Collected:  01/08/17 1129    Specimen:  Sputum from Tracheal Aspirate Updated:  01/10/17 1727    Narrative:       ORDER#: [id number]                                    ORDERED BY: Lehman Prom  SOURCE: Tracheal Aspirate trach                      COLLECTED:  01/08/17 11:29  ANTIBIOTICS AT COLL.:                                 RECEIVED :  01/08/17 16:01  Stain, Gram (Respiratory)                  FINAL       01/08/17 17:23  01/08/17   Few WBC's             Rare WBC's             Moderate Mixed Respiratory Flora  Culture and Gram Stain, Aerobic, RespiratorPRELIM      01/10/17 17:27   +  01/09/17   Heavy growth of mixed upper respiratory flora  01/10/17   Heavy growth of Serratia marcescens               Further workup to follow including susceptibility testing    01/10/17   Heavy growth of Pseudomonas aeruginosa               Further workup to follow including susceptibility testing        Urine culture [[id number]] Collected:  01/08/17 1129    Specimen:  Urine from Urine, Catheterized, In & Out Updated:  01/10/17 1517    Narrative:       ORDER#: [id number]                                    ORDERED BY: Lehman Prom  SOURCE: Urine, Catheterized, In & Out                COLLECTED:  01/08/17 11:29  ANTIBIOTICS AT COLL.:                                RECEIVED :  01/08/17 15:48  Culture Urine                              FINAL       01/10/17 15:17   +  01/10/17   >100,000 CFU/ML MDR Proteus mirabilis               Cefazolin results predict results for the oral agents             cefuroxime axetil, cephalexin, cefdinir, cefpodoxime, and             cefprozil when used for therapy of uncomplicated UTIs due to             E.coli, K. pneumonia, and P. mirabilis. CLSI M100 S25             This multidrug resistant (MDR) Enterobacteriaceae is resistant             to ceftriaxone and may not respond optimally to B-lactam             antibiotics (excluding carbapenems).             [facility] Antimicrobial Subcommittee June 2015    _____________________________________________________________________________                                MDR P.mirabilis   ANTIBIOTICS                     MIC  INTRP      _____________________________________________________________________________  Amikacin                        >32    R        Amoxicillin/CA                  <=4/2   S        Ampicillin                      >16    R        Aztreonam                       >16    R        Cefazolin                       >16    R        Cefepime                        >16    R        Ceftazidime                     >16    R        Ceftriaxone                     >  32    R        Ciprofloxacin                   >2     R        Ertapenem                     <=0.25   S        Gentamicin                      >8     R        Levofloxacin                    >4     R        Nitrofurantoin                  >64    R  D1    Piperacillin/Tazobactam        <=2/4   S        Tetracycline                    >8     R        Tobramycin                      >8     R        Trimethoprim/Sulfamethoxazole  >2/38   R          -----DRUG COMMENTS----------    D1:  Nitrofurantoin should only be used for the treatment of         uncomplicated cystitis.         [facility] Antimicrobial Subcommittee June 2015  _____________________________________________________________________________            S=SUSCEPTIBLE     I=INTERMEDIATE     R=RESISTANT                            N/S=NON-SUSCEPTIBLE  _____________________________________________________________________________              IRadiology:   Radiological Procedure reviewed.  Xr Abdomen Ap    Result Date: 12/16/2016  Nonspecific bowel gas pattern Lorinda Creed, MD 12/16/2016 7:13 PM    Ct Head Wo Contrast    Result Date: 01/08/2017    No acute intracranial abnormality. Wyatt Portela, MD 01/08/2017 11:16 AM    Xr Chest Ap Portable    Result Date: 01/10/2017   Technically limited portable study. Increased bilateral pleural effusions and bibasilar opacification. Heron Nay, MD 01/10/2017 8:01 AM    Xr Chest  Ap Portable    Result Date: 01/08/2017    Interval improvement of the vascular congestion and pulmonary edema and decrease in size of the pleural effusions. Heron Nay, MD 01/08/2017 11:29 AM    Xr Chest Ap Portable    Result Date: 12/25/2016   Overall stable  exam with pulmonary vascular engorgement and layering bilateral pleural effusions. Stable bibasilar opacities in keeping with some combination of atelectasis, layering pleural effusion, and/or lower lobe infiltrates. Gustavus Messing, MD 12/25/2016 7:51 AM    Xr Chest Ap Portable    Result Date: 12/24/2016  1. Worsening pulmonary vascular congestion. Enlargement of bilateral pleural effusions. 2. Consolidation throughout the lung bases which may be secondary to layering pleural fluid,  subsegmental atelectatic changes and/or infiltrates.  Fonnie Mu, MD 12/24/2016 8:41 AM    Xr Chest Ap Portable    Result Date: 12/23/2016   Stable multifocal airspace disease, most likely CHF/fluid overload although nonspecific, with continued moderate bilateral pleural effusions and underlying lower lobe consolidation likely atelectasis. No change from XR CHEST AP PORTABLE with report dated 12/22/2016 9:53 AM. Laurena Slimmer, MD 12/23/2016 8:15 AM    Xr Chest Ap Portable    Result Date: 12/22/2016   Improvement in pulmonary edema with continued moderate bilateral pleural effusions and underlying lower lobe consolidation likely atelectasis. Laurena Slimmer, MD 12/22/2016 9:53 AM    Xr Chest Ap Portable    Result Date: 12/21/2016   Findings consistent with volume overload, overall stable.  Georgana Curio, MD 12/21/2016 7:51 AM    Xr Chest Ap Portable    Result Date: 12/20/2016  1. There is no overall change in the patients pleural effusion and hypoventilation bilaterally. 2. PICC line is noted in position without complication. Charlene Brooke, MD 12/20/2016 7:52 AM    Xr Chest Ap Portable    Result Date: 12/19/2016   Stable examination with bilateral hazy parenchymal opacification and pleural effusions likely due to CHF/fluid overload No change from XR CHEST AP PORTABLE with report dated 12/18/2016 8:38 AM. Laurena Slimmer, MD 12/19/2016 8:17 AM    Xr Chest Ap Portable    Result Date: 12/18/2016   Stable examination with bilateral hazy parenchymal  opacification and pleural effusions. Cannot exclude underlying pulmonary edema. Mitali  Bapna, MD 12/18/2016 8:38 AM    Xr Chest Ap Portable    Result Date: 12/17/2016   Good position of intravascular catheter. No pneumothorax. Stable infiltrates and effusions most compatible with pulmonary vascular congestion. Kinnie Feil, MD 12/17/2016 1:58 PM    Xr Chest Ap Portable    Result Date: 12/17/2016   No significant interval change in bilateral pleural effusions and bibasilar opacities in keeping with compressive atelectasis, with concurrent infiltrates not excluded. Gustavus Messing, MD 12/17/2016 8:19 AM    Xr Chest Ap Portable    Result Date: 12/16/2016   No significant change with right larger than left pleural effusions and bibasilar atelectasis versus airspace disease.Filbert Schilder, MD 12/16/2016 8:30 AM    Xr Chest Ap Portable    Result Date: 12/15/2016   Moderate right and small left pleural effusions. Right greater than left basilar opacification, improved. Mitali  Bapna, MD 12/15/2016 7:27 AM    Xr Chest  Ap Portable    Result Date: 12/14/2016  1. Moderate volume right and small volume left pleural effusions. 2. Density throughout the lung bases which may represent atelectatic changes or infiltrates. A follow-up PA and lateral chest is recommended when the patient is stable Fonnie Mu, MD 12/14/2016 1:21 PM    US Venous Up Extrem Duplex Dopp Uni Right    Addendum Date: 12/20/2016    IMPRESSION: Thrombus within one of the paired basilic vein branches of the distal aspect of the right upper arm, recommend repeat ultrasound in 10-14 days to ensure no propagation into the axillary vein. Anticoagulation is NOT warranted at this time. Ronalee Red, MD 12/20/2016 3:08 PM    Result Date: 12/20/2016   Thrombus within one of the paired basilic vein branches of the distal aspect of the right upper arm, recommend repeat ultrasound in 10-14 days to ensure no propagation into the axillary vein. Anticoagulation is now warranted  at  this time. This study was read in conjunction with Dr. Alphonzo Grieve. Ronalee Red, MD 12/19/2016 1:47 PM    G,j,g/j Tube Replacement    Result Date: 01/10/2017    Sinogram of the old gastrostomy tube tract allowing recanalization and redilation with eventual replacement of 20 French MIC gastrostomy catheter in good position the stomach. The new catheter may be used immediately. Dara Lords, MD 01/10/2017 11:05 AM      Leonia Corona Karenann Cai, MD  01/12/2017  7:17 PM

## 2017-01-12 NOTE — Plan of Care (Signed)
Problem: Artificial Airway  Goal: Tracheostomy will be maintained  Outcome: Progressing   01/12/17 1855   Goal/Interventions addressed this shift   Tracheostomy will be maintained Suction secretions as needed;Keep head of bed at 30 degrees, unless contraindicated;Encourage/perform oral hygiene as appropriate;Perform deep oropharyngeal suctioning at least every 4 hours;Apply water-based moisturizer to lips;Utilize tracheostomy securing device;Support ventilator tubing to avoid pressure from drag of tubing;Tracheostomy care every shift and as needed;Keep additional tracheostomy tube of the same size and one size smaller at bedside   Tracheostomy has been maintained. Airway has been patent throughout the shift. During trach care today, a large amount of yellow-green mucous has collected around the stoma of the trach. RT was called to aid with cleaning the area. It was difficult to remove and very thick. Otherwise no problems. SPO2 has been satisfactory throughout the shift. Mouth care has been performed per protocol. Will continue to monitor and treat as ordered.

## 2017-01-12 NOTE — Progress Notes (Signed)
Infectious Diseases & Tropical Medicine  Progress Note    01/12/2017   [name] WGN:[id number],ION:[id number] is a 83 y.o. male,       Assessment:      Sepsis.   Gram-positive rods bacteremia (2/2)-identification still pending as per microbiology, but does not appear to be listeria   Altered mental status resolved.   Chest x-ray-increased bilateral pleural effusions and bibasilar consolidation (01/10/2017)   Sputum culture- Serratia marcescens and Pseudomonas   Recently treated MDR organisms causing pneumonia   Pro-calcitonin level-6.7 (01/09/2017).   Pro-calcitonin level-3.9 (01/10/2017)   Urine culture-MDR Proteus mirabilis   Patient without Foley catheter.   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   Status post tracheostomy andG-tube 6placement   Resident of [name]   No fever or leukocytosis    Plan:      Start Zosyn   Await final identification of gram-positive rods in the blood   Follow-up chest x-ray.   Continue probiotics.   Monitor electrolytes and renal functions closely.   Monitor clinically.   Discussed with pharmacy.    ROS:     General: No fever, no chills, no rigor, awake and alert  HEENT: no neck pain, no throat pain, hard of hearing, tracheostomy in place  Endocrine:  no fatigue, no night sweats  Respiratory: Occasional cough, shortness of breath, or wheezing   Cardiovascular: no chest pain   Gastrointestinal: no abdominal pain,no N/V/D  Genito-Urinary: no dysuria or hematuria   Musculoskeletal: no edema  Neurological: c/o generalized weakness   Dermatological: no rash, no ulcer    Physical Examination:     Blood pressure 98/67, pulse (!) 106, temperature 99.1 F (37.3 C), temperature source Oral, resp. rate 18, height 1.854 m (6' 0.99"), weight 98.7 kg (217 lb 9.5 oz), SpO2 98 %.     General Appearance: Comfortable, and in no acute distress.   HEENT: Pupils are equal, round, and reactive to light.    Lungs:  Decreased breath sounds   Heart:  Tachycardia   Chest: Symmetric chest wall expansion.    Abdomen: soft ,non tender,no hepatosplenomegaly,good bowel sounds   Neurological: Paraplegia   Extremities: No edema    Laboratory And Diagnostic Studies:     Recent Labs      01/11/17   0438   WBC  8.93   Hgb  9.2*   Hematocrit  29.6*   Platelets  220     Recent Labs      01/11/17   0438  01/10/17   0452   Sodium  142  143   Potassium  3.6  3.3*   Chloride  109  111   CO2  25  26   BUN  24.0  29.0*   Creatinine  0.7  0.7   Glucose  109*  97   Calcium  8.2  8.1     No results for input(s): AST, ALT, ALKPHOS, PROT, ALB in the last 72 hours.    Current Meds:      Scheduled Meds: PRN Meds:        albuterol-ipratropium 3 mL Nebulization Q4H SCH   amiodarone 200 mg Oral Daily   amLODIPine 5 mg Oral Daily   ampicillin-sulbactam 3 g Intravenous Q6H   atorvastatin 20 mg per G tube QHS   balsam peru-castor oil (VENELEX)  Topical Q12H   bumetanide 0.5 mg per G tube Daily   famotidine 20 mg Oral QHS  glycopyrrolate 1 mg per G tube Daily   lactobacillus/streptococcus 1 capsule per G tube Daily   metoclopramide 5 mg per G tube Q8H   metoprolol tartrate 25 mg per G tube BID   rivaroxaban 20 mg per G tube Daily with dinner   scopolamine 1 patch Transdermal Q72H       Continuous Infusions:  . dextrose 5 % and 0.9% NaCl 75 mL/hr at 01/10/17 0454      acetaminophen 650 mg Q4H PRN   Or     acetaminophen 650 mg Q4H PRN   naloxone 0.2 mg PRN   ondansetron 4 mg Q6H PRN   Or     ondansetron 4 mg Q6H PRN   oxyCODONE-acetaminophen 1 tablet Q6H PRN   senna 17.2 mg QD PRN         Raynah Gomes A. Janalyn Rouse, M.D.  01/12/2017  9:34 AM

## 2017-01-12 NOTE — Plan of Care (Signed)
Problem: Inadequate Gas Exchange  Goal: Provide mechanical and oxygen support to facilitate gas exchange  Outcome: Progressing  Remains on trache vent and well tolerated;kept hob elevated;oral hygiene provided;turned and repositioned;suctioned prn ;trache care provided.  Goal: Adequate oxygenation and improved ventilation  Outcome: Progressing   01/12/17 5409   Goal/Interventions addressed this shift   Adequate oxygenation and improved ventilation Assess lung sounds;Monitor SpO2 and treat as needed;Monitor and treat ETCO2;Provide mechanical and oxygen support to facilitate gas exchange;Teach/reinforce use of incentive spirometer 10 times per hour while awake, cough and deep breath as needed;Position for maximum ventilatory efficiency;Plan activities to conserve energy: plan rest periods;Increase activity as tolerated/progressive mobility;Consult/collaborate with Respiratory The   01/12/17 8119   Goal/Interventions addressed this shift   Adequate oxygenation and improved ventilation Assess lung sounds;Monitor SpO2 and treat as needed;Monitor and treat ETCO2;Provide mechanical and oxygen support to facilitate gas exchange;Teach/reinforce use of incentive spirometer 10 times per hour while awake, cough and deep breath as needed;Position for maximum ventilatory efficiency;Plan activities to conserve energy: plan rest periods;Increase activity as tolerated/progressive mobility;Consult/collaborate with Respiratory Therapy   rapy

## 2017-01-13 LAB — GLUCOSE WHOLE BLOOD - POCT
Whole Blood Glucose POCT: 102 mg/dL — ABNORMAL HIGH (ref 70–100)
Whole Blood Glucose POCT: 107 mg/dL — ABNORMAL HIGH (ref 70–100)
Whole Blood Glucose POCT: 107 mg/dL — ABNORMAL HIGH (ref 70–100)
Whole Blood Glucose POCT: 121 mg/dL — ABNORMAL HIGH (ref 70–100)

## 2017-01-13 NOTE — Plan of Care (Signed)
Problem: Moderate/High Fall Risk Score >5  Goal: Patient will remain free of falls   01/13/17 0800   OTHER   High (Greater than 13) HIGH-Apply yellow "Fall Risk" arm band;HIGH-Activate bed/chair exit alarm where available;HIGH-Initiate use of floor mats as appropriate;HIGH-Consider use of low bed;HIGH-(VH Only) Yellow slippers;HIGH-(VH Only) Keep door open for better visability       Problem: Inadequate Gas Exchange  Goal: Adequate oxygenation and improved ventilation  Outcome: Progressing   01/13/17 1517   Goal/Interventions addressed this shift   Adequate oxygenation and improved ventilation Assess lung sounds;Monitor SpO2 and treat as needed;Provide mechanical and oxygen support to facilitate gas exchange;Position for maximum ventilatory efficiency;Consult/collaborate with Respiratory Therapy       Problem: Compromised Tissue integrity  Goal: Damaged tissue is healing and protected  Outcome: Progressing   01/13/17 1517   Goal/Interventions addressed this shift   Damaged tissue is healing and protected  Monitor/assess Braden scale every shift;Reposition patient every 2 hours and as needed unless able to reposition self;Relieve pressure to bony prominences for patients at moderate and high risk;Avoid shearing injuries;Keep intact skin clean and dry;Use bath wipes, not soap and water, for daily bathing;Use incontinence wipes for cleaning urine, stool and caustic drainage. Foley care as needed;Monitor external devices/tubes for correct placement to prevent pressure, friction and shearing;Encourage use of lotion/moisturizer on skin;Monitor patient's hygiene practices;Consult/collaborate with wound care nurse     Goal: Nutritional status is improving  Outcome: Progressing   01/13/17 1517   Goal/Interventions addressed this shift   Nutritional status is improving Collaborate with Clinical Nutritionist;Encourage patient to take dietary supplement(s) as ordered       Problem: Artificial Airway  Goal: Tracheostomy will be  maintained  Outcome: Progressing   01/13/17 1517   Goal/Interventions addressed this shift   Tracheostomy will be maintained Suction secretions as needed;Keep head of bed at 30 degrees, unless contraindicated;Encourage/perform oral hygiene as appropriate;Perform deep oropharyngeal suctioning at least every 4 hours;Apply water-based moisturizer to lips;Utilize tracheostomy securing device;Support ventilator tubing to avoid pressure from drag of tubing;Tracheostomy care every shift and as needed;Keep additional tracheostomy tube of the same size and one size smaller at bedside;Maintain surgical airway kit or tracheostomy tray at bedside       Problem: Inadequate Cardiac Output  Goal: Adequate tissue perfusion will be maintained  Outcome: Progressing   01/13/17 1517   Goal/Interventions addressed this shift   Adequate tissue perfusion will be maintained Monitor/assess lab values and report abnormal values;Monitor/assess vital signs;Monitor/assess neurovascular status (pulses, capillary refill, pain, paresthesia, paralysis, presence of edema);Monitor/assess for signs of VTE (edema of calf/thigh redness, pain);Monitor intake and output;Monitor for signs and symptoms of a pulmonary embolism (dyspnea, tachypnea, tachycardia, confusion);VTE Prevention: Administer anticoagulant(s) and/or apply anti-embolism stockings/devices as ordered;Encourage/assist patient as needed to turn, cough, and perform deep breathing every 2 hours;Perform active/passive ROM;Increase mobility as tolerated/progressive mobility;Elevate feet;Position patient for maximum circulation/cardiac output;Provide wound/skin care;Assess and monitor skin integrity       Problem: Infection  Goal: Free from infection  Outcome: Progressing   01/13/17 1517   OTHER   Free from infection  Assess for signs/symptoms of infection;Utilize isolation precautions per protocol/policy;Consult/collaborate with Infection Preventionist;Utilize sepsis protocol

## 2017-01-13 NOTE — Progress Notes (Addendum)
INFECTIOUS DISEASES CROSS COVER  PROGRESS NOTE      Date Time: 01/13/17 2:55 PM  Patient Name: [name],[name]      Assessment and Plan:     83 year old male resident of [name], cervical fracture/quadraparesis, trach/PEG, chronic coccygeal pressure sore, admitted 01/08/17 for sepsis/AMS.    1.  MDR Proteus UTI.  Zosyn mic </=2/4.  2.  Pseudomonas/Klebs HCAP.  3.  GPR bacteremia.  Significance TBD.  4.  GT dysfunction.    -repeat BCs.  -continue Zosyn.  -Dr. Janalyn Rouse returns tomorrow, please call if questions/concerns rest of weekend.    (  Subjective:   n/a    Review of Systems:   Review of systems unobtainable due to patient's medical condition    Antibiotics:   As above    Central Access/Endovascular Hardware:   Per LDAs    Physical Exam:   Temp:  [98.9 F (37.2 C)-102 F (38.9 C)] 99 F (37.2 C)  Heart Rate:  [92-125] 105  Resp Rate:  [8-27] 19  BP: (95-123)/(55-83) 123/70  FiO2:  [35 %-100 %] 35 %    Constitutional: ill appearing  Neck: trach   Cardiovascular: distant tachy heart sounds.  Pulmonary/Chest: anterior rhonchi   Abdominal: soft, distended, PEG.   Extremities: trace edema, VS changes.  Skin: No obvious drug rash.   Neurological: unresponsive, generalized weakness     Labs:     Results     Procedure Component Value Units Date/Time    Blood Culture Aerobic/Anaerobic #2 [[id number]] Collected:  01/08/17 1029    Specimen:  Arm from Blood Updated:  01/13/17 1342    Narrative:       [id number]_ called Micro Results of_pos bld. Results read back [id number], by [id number]  on 01/09/2017 at 21:45  ORDER#: [id number]                                    ORDERED BY: Lehman Prom  SOURCE: Blood arm                                    COLLECTED:  01/08/17 10:29  ANTIBIOTICS AT COLL.:                                RECEIVED :  01/08/17 15:23  61101_ called Micro Results of_pos bld. Results read back [id number], by [id number] on 01/09/2017 at 21:45  Culture Blood Aerobic and Anaerobic        PRELIM      01/13/17 13:42    +  01/09/17   Anaerobic Blood Culture Positive in 24 to 48 hours             Gram Stain Shows: Gram positive rods  01/11/17   Aerobic culture no growth to date, final report to follow  01/11/17   Growth of Gram-positive rod               Identification to follow        Blood Culture Aerobic/Anaerobic #1 [[id number]] Collected:  01/08/17 1029    Specimen:  Arm from Blood Updated:  01/13/17 1338    Narrative:       [id number] called Micro Results of_pos bld. Results read back [id number], by [phone]  on 01/10/2017 at 00:36  Source: Blood  Test  code: [id number]  Submitted: [facility]  ORDER#: [id number]                                    ORDERED BY: Lehman Prom  SOURCE: Blood arm                                    COLLECTED:  01/08/17 10:29  ANTIBIOTICS AT COLL.:                                RECEIVED :  01/08/17 15:23  [id number] called Micro Results of_pos bld. Results read back [id number], by [phone] on 01/10/2017 at 00:36  Culture Blood Aerobic and Anaerobic        PRELIM      01/13/17 13:38   +  01/09/17   No Growth after 1 day/s of incubation.  01/10/17   Anaerobic Blood Culture Positive in 24 to 48 hours             Gram Stain Shows: Gram positive rods             Identification to follow  01/12/17   Aerobic Blood Culture Positive after 4 days             Gram Stain Shows: Gram positive rods             Identification to follow  01/11/17   Growth of Gram-positive rod               Identification to follow             Isolate sent to reference lab        Glucose Whole Blood - POCT [[id number]]  (Abnormal) Collected:  01/13/17 1226     Updated:  01/13/17 1247     POCT - Glucose Whole blood 121 (H) mg/dL     Glucose Whole Blood - POCT [[id number]]  (Abnormal) Collected:  01/12/17 1748     Updated:  01/13/17 0807     POCT - Glucose Whole blood 121 (H) mg/dL     Glucose Whole Blood - POCT [[id number]]  (Abnormal) Collected:  01/13/17 0649     Updated:  01/13/17 0651     POCT - Glucose Whole blood 107 (H) mg/dL     Glucose Whole Blood - POCT  [[id number]]  (Abnormal) Collected:  01/13/17 0050     Updated:  01/13/17 0055     POCT - Glucose Whole blood 107 (H) mg/dL     Glucose Whole Blood - POCT [[id number]]  (Abnormal) Collected:  01/12/17 2213     Updated:  01/12/17 2216     POCT - Glucose Whole blood 114 (H) mg/dL           Rads:     Radiology Results (24 Hour)     ** No results found for the last 24 hours. **          Signed by: Anice Paganini

## 2017-01-13 NOTE — Plan of Care (Signed)
Problem: Compromised Tissue integrity  Goal: Damaged tissue is healing and protected  Outcome: Progressing   01/13/17 0323   Goal/Interventions addressed this shift   Damaged tissue is healing and protected  Monitor/assess Braden scale every shift;Provide wound care per wound care algorithm;Reposition patient every 2 hours and as needed unless able to reposition self;Increase activity as tolerated/progressive mobility;Relieve pressure to bony prominences for patients at moderate and high risk;Keep intact skin clean and dry;Use bath wipes, not soap and water, for daily bathing;Use incontinence wipes for cleaning urine, stool and caustic drainage. Foley care as needed;Monitor external devices/tubes for correct placement to prevent pressure, friction and shearing   Pt has a pressure ulcer on sacrum, wound cleansed, venelex applied and dressing changed. Reposition q2H.

## 2017-01-13 NOTE — Progress Notes (Signed)
INTERNAL MEDICINE PROGRESS NOTE    Progress Note - Corleone Biegler    Date Time: 01/13/17 12:25 PM  Patient Name: 83 y.o. male with Altered mental status, unspecified altered mental status type      Assessment :     GPR Bacteremia.  Pneumonia/Serrata/Pseudomonas  Malfunction G-tube  Encephalopathy, metabolic  AMS.  Tracheostomy/Vent.  Acute on chronic respiratory failure/trached and ventilated  HTN.  CAF    Plan:      Identification/Sususpetibility pending.   On Zosyn.   CVIR consulted for malfunctioning of G-tubeGI prophylaxis   ID recommendation reviewed.   DVT prophylaxis   Discussed plan of care with patient and family.   Discussed plan of care with nurses.   Discussed case with consultants.    Subjective:     Pts events ;ast 24 hours reviewed.    Review of Systems:     General ROS: No fatigue, no generalized aches or pains.   HEENT: No eye pain , redness or discharge. No nasal congestion  Respiratory ROS: No cough, SOB.  Cardiovascular ROS: No chest pain, palpitation ,orthopnea or PND  Gastrointestinal ROS: No abdominal pain, diarrhea, nausea or vomiting  Genito-Urinary ROS: No hematuria, dysuria, urgency or frequency.  Musculoskeletal ROS: No myalgia or arthralgia.  Neurological ROS: No dizziness, headache or weakness. No aphasia or slurred speech    Physical Exam:     Vitals:    01/13/17 1200   BP: 123/70   Pulse: (!) 105   Resp: 19   Temp:    SpO2: 100%       Intake/Output Summary (Last 24 hours) at 01/13/17 1225  Last data filed at 01/13/17 0859   Gross per 24 hour   Intake              900 ml   Output                0 ml   Net              900 ml       General appearance- Awake /trached and ventilated /on G-tube feeding  Chest- decreased air entry at the bases of the lung, bilateral  Heart-irregular  Abdomen- G-tube is intact , softabdomen   Back exam- full range of motion, no tenderness, palpable spasm or pain on motion  Neurological - awake quadriparesis  Musculoskeletal- no joint  tenderness, deformity or swelling  Extremities- peripheral pulses normal, no pedal edema, no clubbing or cyanosis  Skin- normal coloration and turgor, no rashes, no suspicious skin lesions noted        Meds Reviewed: Yes  Medications:   Medications:   Scheduled Meds: PRN Meds:        albuterol-ipratropium 3 mL Nebulization Q6H   amiodarone 200 mg Oral Daily   amLODIPine 5 mg Oral Daily   atorvastatin 20 mg per G tube QHS   balsam peru-castor oil (VENELEX)  Topical Q12H   bumetanide 0.5 mg per G tube Daily   famotidine 20 mg Oral QHS   glycopyrrolate 1 mg per G tube Daily   lactobacillus/streptococcus 1 capsule per G tube Daily   metoclopramide 5 mg per G tube Q8H   metoprolol tartrate 25 mg per G tube BID   piperacillin-tazobactam 4.5 g Intravenous Q6H   rivaroxaban 20 mg per G tube Daily with dinner   scopolamine 1 patch Transdermal Q72H         Continuous Infusions:  acetaminophen 650 mg Q4H PRN   Or     acetaminophen 650 mg Q4H PRN   naloxone 0.2 mg PRN   ondansetron 4 mg Q6H PRN   Or     ondansetron 4 mg Q6H PRN   oxyCODONE-acetaminophen 1 tablet Q6H PRN   senna 17.2 mg QD PRN             Labs:   CHEMISTRY:     Recent Labs  Lab 01/11/17  0438 01/10/17  0452 01/09/17  0347 01/08/17  1029   Glucose 109* 97 76 179*   BUN 24.0 29.0* 33.0* 32.0*   Creatinine 0.7 0.7 0.8 0.8   Calcium 8.2 8.1 8.2 9.1   Sodium 142 143 142 140   Potassium 3.6 3.3* 3.8 4.4   Chloride 109 111 108 101   CO2 25 26 23  30*   Albumin  --   --   --  2.5*   AST (SGOT)  --   --   --  24   ALT  --   --   --  24   Bilirubin, Total  --   --   --  0.4   Alkaline Phosphatase  --   --   --  67           Invalid input(s):  AMYLASE    Recent Labs  Lab 01/09/17  0347 01/08/17  1910 01/08/17  1029   Troponin I 0.04 0.10* 0.03         @LABRCNTIP (TSH:3,FRET3:3,FREET4):3)@    HEMATOLOGY:    Recent Labs  Lab 01/11/17  0438 01/09/17  0347 01/08/17  1029   WBC 8.93 15.57* 26.48*   Hgb 9.2* 9.1* 10.5*   Hematocrit 29.6* 29.3* 34.7*   MCV 85.1 85.2 88.3    MCH 26.4* 26.5* 26.7*   MCHC 31.1* 31.1* 30.3*   Platelets 220 268 349           Invalid input(s):  APTT  Invalid input(s): ARTERIAL BLOOD GAS    URINALYSIS:    Recent Labs  Lab 01/08/17  1129   Urine Type Clean Catch   Color, UA Amber*   Clarity, UA Sl Cloudy*   Specific Gravity UA 1.017   Urine pH 6.0   Nitrite, UA Negative   Ketones UA Negative   Urobilinogen, UA Negative   Bilirubin, UA Negative   Blood, UA Large*   RBC, UA TNTC*   WBC, UA TNTC*       MICROBIOLOGY:  Microbiology Results     Procedure Component Value Units Date/Time    Blood Culture Aerobic/Anaerobic #1 [[id number]] Collected:  01/08/17 1029    Specimen:  Arm from Blood Updated:  01/10/17 0038    Narrative:       [id number] called Micro Results of_pos bld. Results read back [id number], by [id number]  on 01/10/2017 at 00:36  ORDER#: [id number]                                    ORDERED BY: Lehman Prom  SOURCE: Blood arm                                    COLLECTED:  01/08/17 10:29  ANTIBIOTICS AT COLL.:  RECEIVED :  01/08/17 15:23  [id number] called Micro Results of_pos bld. Results read back [id number], by [phone] on 01/10/2017 at 00:36  Culture Blood Aerobic and Anaerobic        PRELIM      01/10/17 00:36   +  01/09/17   No Growth after 1 day/s of incubation.  01/10/17   Anaerobic Blood Culture Positive in 24 to 48 hours             Gram Stain Shows: Gram positive rods             Identification to follow      Blood Culture Aerobic/Anaerobic #2 [[id number]] Collected:  01/08/17 1029    Specimen:  Arm from Blood Updated:  01/10/17 1519    Narrative:       [id number]_ called Micro Results of_pos bld. Results read back [id number], by [id number]  on 01/09/2017 at 21:45  ORDER#: [id number]                                    ORDERED BY: Lehman Prom  SOURCE: Blood arm                                    COLLECTED:  01/08/17 10:29  ANTIBIOTICS AT COLL.:                                RECEIVED :  01/08/17 15:23  [id number]_ called Micro Results of_pos bld.  Results read back [id number], by [id number] on 01/09/2017 at 21:45  Culture Blood Aerobic and Anaerobic        PRELIM      01/10/17 15:19   +  01/09/17   Anaerobic Blood Culture Positive in 24 to 48 hours             Gram Stain Shows: Gram positive rods             Identification to follow  01/10/17   Culture requires further incubation, results to follow      MRSA Culture [[id number]] Collected:  01/08/17 1029    Specimen:  Body Fluid from Nasal/Throat ASC Admission Updated:  01/09/17 1653    Narrative:       ORDER#: [id number]                                    ORDERED BY: Lehman Prom  SOURCE: Nares and Throat                             COLLECTED:  01/08/17 10:29  ANTIBIOTICS AT COLL.:                                RECEIVED :  01/08/17 15:48  Culture MRSA Surveillance                  FINAL       01/09/17 16:53  01/09/17   Negative for Methicillin Resistant Staph aureus from Nares and             Negative for Methicillin Resistant Staph aureus from Throat  MRSA culture [[id number]] Collected:  01/09/17 0423    Specimen:  Body Fluid from Nasal/Throat ASC Admission Updated:  01/11/17 0950    Narrative:       ORDER#: [id number]                                    ORDERED BY: AL KHOURI, SAMI  SOURCE: Nares and Throat                             COLLECTED:  01/09/17 04:23  ANTIBIOTICS AT COLL.:                                RECEIVED :  01/09/17 10:27  Culture MRSA Surveillance                  FINAL       01/11/17 09:50   +  01/11/17   Positive for Methicillin Resistant Staph aureus from Nares             Negative for Methicillin Resistant Staph aureus from Throat      Sputum Culture [[id number]] Collected:  01/08/17 1129    Specimen:  Sputum from Tracheal Aspirate Updated:  01/10/17 1727    Narrative:       ORDER#: [id number]                                    ORDERED BY: Lehman Prom  SOURCE: Tracheal Aspirate trach                      COLLECTED:  01/08/17 11:29  ANTIBIOTICS AT COLL.:                                 RECEIVED :  01/08/17 16:01  Stain, Gram (Respiratory)                  FINAL       01/08/17 17:23  01/08/17   Few WBC's             Rare WBC's             Moderate Mixed Respiratory Flora  Culture and Gram Stain, Aerobic, RespiratorPRELIM      01/10/17 17:27   +  01/09/17   Heavy growth of mixed upper respiratory flora  01/10/17   Heavy growth of Serratia marcescens               Further workup to follow including susceptibility testing    01/10/17   Heavy growth of Pseudomonas aeruginosa               Further workup to follow including susceptibility testing        Urine culture [[id number]] Collected:  01/08/17 1129    Specimen:  Urine from Urine, Catheterized, In & Out Updated:  01/10/17 1517    Narrative:       ORDER#: [id number]                                    ORDERED BY: Lehman Prom  SOURCE: Urine, Catheterized, In & Out                COLLECTED:  01/08/17 11:29  ANTIBIOTICS AT COLL.:                                RECEIVED :  01/08/17 15:48  Culture Urine                              FINAL       01/10/17 15:17   +  01/10/17   >100,000 CFU/ML MDR Proteus mirabilis               Cefazolin results predict results for the oral agents             cefuroxime axetil, cephalexin, cefdinir, cefpodoxime, and             cefprozil when used for therapy of uncomplicated UTIs due to             E.coli, K. pneumonia, and P. mirabilis. CLSI M100 S25             This multidrug resistant (MDR) Enterobacteriaceae is resistant             to ceftriaxone and may not respond optimally to B-lactam             antibiotics (excluding carbapenems).              System Antimicrobial Subcommittee June 2015    _____________________________________________________________________________                                MDR P.mirabilis   ANTIBIOTICS                     MIC  INTRP      _____________________________________________________________________________  Amikacin                        >32    R        Amoxicillin/CA                  <=4/2   S        Ampicillin                      >16    R        Aztreonam                       >16    R        Cefazolin                       >16    R        Cefepime                        >16    R        Ceftazidime                     >16    R        Ceftriaxone                     >  32    R        Ciprofloxacin                   >2     R        Ertapenem                     <=0.25   S        Gentamicin                      >8     R        Levofloxacin                    >4     R        Nitrofurantoin                  >64    R  D1    Piperacillin/Tazobactam        <=2/4   S        Tetracycline                    >8     R        Tobramycin                      >8     R        Trimethoprim/Sulfamethoxazole  >2/38   R          -----DRUG COMMENTS----------    D1:  Nitrofurantoin should only be used for the treatment of         uncomplicated cystitis.         [place] [facility] Antimicrobial Subcommittee June 2015  _____________________________________________________________________________            S=SUSCEPTIBLE     I=INTERMEDIATE     R=RESISTANT                            N/S=NON-SUSCEPTIBLE  _____________________________________________________________________________              IRadiology:   Radiological Procedure reviewed.  Xr Abdomen Ap    Result Date: 12/16/2016  Nonspecific bowel gas pattern Lorinda Creed, MD 12/16/2016 7:13 PM    Ct Head Wo Contrast    Result Date: 01/08/2017    No acute intracranial abnormality. Wyatt Portela, MD 01/08/2017 11:16 AM    Xr Chest Ap Portable    Result Date: 01/10/2017   Technically limited portable study. Increased bilateral pleural effusions and bibasilar opacification. Heron Nay, MD 01/10/2017 8:01 AM    Xr Chest  Ap Portable    Result Date: 01/08/2017    Interval improvement of the vascular congestion and pulmonary edema and decrease in size of the pleural effusions. Heron Nay, MD 01/08/2017 11:29 AM    Xr Chest Ap Portable    Result Date: 12/25/2016   Overall stable  exam with pulmonary vascular engorgement and layering bilateral pleural effusions. Stable bibasilar opacities in keeping with some combination of atelectasis, layering pleural effusion, and/or lower lobe infiltrates. Gustavus Messing, MD 12/25/2016 7:51 AM    Xr Chest Ap Portable    Result Date: 12/24/2016  1. Worsening pulmonary vascular congestion. Enlargement of bilateral pleural effusions. 2. Consolidation throughout the lung bases which may be secondary to layering pleural fluid,  subsegmental atelectatic changes and/or infiltrates.  Fonnie Mu, MD 12/24/2016 8:41 AM    Xr Chest Ap Portable    Result Date: 12/23/2016   Stable multifocal airspace disease, most likely CHF/fluid overload although nonspecific, with continued moderate bilateral pleural effusions and underlying lower lobe consolidation likely atelectasis. No change from XR CHEST AP PORTABLE with report dated 12/22/2016 9:53 AM. Laurena Slimmer, MD 12/23/2016 8:15 AM    Xr Chest Ap Portable    Result Date: 12/22/2016   Improvement in pulmonary edema with continued moderate bilateral pleural effusions and underlying lower lobe consolidation likely atelectasis. Laurena Slimmer, MD 12/22/2016 9:53 AM    Xr Chest Ap Portable    Result Date: 12/21/2016   Findings consistent with volume overload, overall stable.  Georgana Curio, MD 12/21/2016 7:51 AM    Xr Chest Ap Portable    Result Date: 12/20/2016  1. There is no overall change in the patients pleural effusion and hypoventilation bilaterally. 2. PICC line is noted in position without complication. Charlene Brooke, MD 12/20/2016 7:52 AM    Xr Chest Ap Portable    Result Date: 12/19/2016   Stable examination with bilateral hazy parenchymal opacification and pleural effusions likely due to CHF/fluid overload No change from XR CHEST AP PORTABLE with report dated 12/18/2016 8:38 AM. Laurena Slimmer, MD 12/19/2016 8:17 AM    Xr Chest Ap Portable    Result Date: 12/18/2016   Stable examination with bilateral hazy parenchymal  opacification and pleural effusions. Cannot exclude underlying pulmonary edema. Mitali  Bapna, MD 12/18/2016 8:38 AM    Xr Chest Ap Portable    Result Date: 12/17/2016   Good position of intravascular catheter. No pneumothorax. Stable infiltrates and effusions most compatible with pulmonary vascular congestion. Kinnie Feil, MD 12/17/2016 1:58 PM    Xr Chest Ap Portable    Result Date: 12/17/2016   No significant interval change in bilateral pleural effusions and bibasilar opacities in keeping with compressive atelectasis, with concurrent infiltrates not excluded. Gustavus Messing, MD 12/17/2016 8:19 AM    Xr Chest Ap Portable    Result Date: 12/16/2016   No significant change with right larger than left pleural effusions and bibasilar atelectasis versus airspace disease.Filbert Schilder, MD 12/16/2016 8:30 AM    Xr Chest Ap Portable    Result Date: 12/15/2016   Moderate right and small left pleural effusions. Right greater than left basilar opacification, improved. Mitali  Bapna, MD 12/15/2016 7:27 AM    Xr Chest  Ap Portable    Result Date: 12/14/2016  1. Moderate volume right and small volume left pleural effusions. 2. Density throughout the lung bases which may represent atelectatic changes or infiltrates. A follow-up PA and lateral chest is recommended when the patient is stable Fonnie Mu, MD 12/14/2016 1:21 PM    US Venous Up Extrem Duplex Dopp Uni Right    Addendum Date: 12/20/2016    IMPRESSION: Thrombus within one of the paired basilic vein branches of the distal aspect of the right upper arm, recommend repeat ultrasound in 10-14 days to ensure no propagation into the axillary vein. Anticoagulation is NOT warranted at this time. Ronalee Red, MD 12/20/2016 3:08 PM    Result Date: 12/20/2016   Thrombus within one of the paired basilic vein branches of the distal aspect of the right upper arm, recommend repeat ultrasound in 10-14 days to ensure no propagation into the axillary vein. Anticoagulation is now warranted  at  this time. This study was read in conjunction with Dr. Alphonzo Grieve. Ronalee Red, MD 12/19/2016 1:47 PM    G,j,g/j Tube Replacement    Result Date: 01/10/2017    Sinogram of the old gastrostomy tube tract allowing recanalization and redilation with eventual replacement of 20 French MIC gastrostomy catheter in good position the stomach. The new catheter may be used immediately. Dara Lords, MD 01/10/2017 11:05 AM      Leonia Corona Karenann Cai, MD  01/13/2017  12:25 PM

## 2017-01-14 LAB — GLUCOSE WHOLE BLOOD - POCT
Whole Blood Glucose POCT: 110 mg/dL — ABNORMAL HIGH (ref 70–100)
Whole Blood Glucose POCT: 154 mg/dL — ABNORMAL HIGH (ref 70–100)
Whole Blood Glucose POCT: 82 mg/dL (ref 70–100)

## 2017-01-14 NOTE — Progress Notes (Signed)
[facility]  INTERNAL MEDICINE PROGRESS NOTE    Date Time: 01/14/17 10:08 AM  Patient Name: [name],[name]    Problem List:      Sepsis   Gram Positive Rod Bacteremia.   Pneumonia ( sputum culture with Serrata & Pseudomonas)   Malfunction G-tube   Metabolic Encephalopathy   Tracheostomy/Vent.   Acute on chronic respiratory failure - trached and ventilated   Hypertension   Chronic Atrial Fibrillation    Plan:      Continue Zosyn per ID recommendations   Follow blood cultures and and tailor antibiotics accordingly   Leukocytosis has resolved   Monitor electrolytes and renal function   Supportive therapy with vent support and enteral feeding.    GI prophylaxis   DVT prophylaxis.   Discussed plan of care with nurses.   Discussed case with consultants.    Subjective:     [name] was admitted on 01/08/17 with sepsis, pneumonia and urinary tract infection.   EMR reviewed and patient seen. He is about the same. No fever, or leukocytosis.     Medications:   Medications:   Scheduled Meds: PRN Meds:        albuterol-ipratropium 3 mL Nebulization Q6H   amiodarone 200 mg Oral Daily   amLODIPine 5 mg Oral Daily   atorvastatin 20 mg per G tube QHS   balsam peru-castor oil (VENELEX)  Topical Q12H   bumetanide 0.5 mg per G tube Daily   famotidine 20 mg Oral QHS   glycopyrrolate 1 mg per G tube Daily   lactobacillus/streptococcus 1 capsule per G tube Daily   metoclopramide 5 mg per G tube Q8H   metoprolol tartrate 25 mg per G tube BID   piperacillin-tazobactam 4.5 g Intravenous Q6H   rivaroxaban 20 mg per G tube Daily with dinner   scopolamine 1 patch Transdermal Q72H          acetaminophen 650 mg Q4H PRN   Or     acetaminophen 650 mg Q4H PRN   naloxone 0.2 mg PRN   ondansetron 4 mg Q6H PRN   Or     ondansetron 4 mg Q6H PRN   oxyCODONE-acetaminophen 1 tablet Q6H PRN   senna 17.2 mg QD PRN             Physical Exam:     VITAL SIGNS   Temp:  [98.8 F (37.1 C)-99.9 F (37.7 C)] 99.9 F (37.7  C)  Heart Rate:  [94-108] 94  Resp Rate:  [18-19] 18  BP: (94-123)/(52-70) 97/56  FiO2:  [30 %-35 %] 30 %  POCT Glucose Result (Read Only)  Avg: 121.6  Min: 82  Max: 148  SpO2: 99 %    Intake/Output Summary (Last 24 hours) at 01/14/17 1008  Last data filed at 01/13/17 1600   Gross per 24 hour   Intake              140 ml   Output                0 ml   Net              140 ml         General: sleepy and in no distress   Heent: pinkish conjunctiva, anicteric sclera, dry mucus membrane   Neck: tracheostomy in place   Cvs: S1 & S 2 well heard, regular rate and rhythm   Chest: Clear to auscultation   Abdomen: Soft, non-tender, active  bowel sounds.   Gus: Foley not present   Ext : no cyanosis, no edema   CNS: sleepy,     Laboratory Results:     CHEMISTRY:     Recent Labs  Lab 01/11/17  0438 01/10/17  0452 01/09/17  0347 01/08/17  1029   Glucose 109* 97 76 179*   BUN 24.0 29.0* 33.0* 32.0*   Creatinine 0.7 0.7 0.8 0.8   Calcium 8.2 8.1 8.2 9.1   Sodium 142 143 142 140   Potassium 3.6 3.3* 3.8 4.4   Chloride 109 111 108 101   CO2 25 26 23  30*   Albumin  --   --   --  2.5*   AST (SGOT)  --   --   --  24   ALT  --   --   --  24   Bilirubin, Total  --   --   --  0.4   Alkaline Phosphatase  --   --   --  67       Recent Labs  Lab 01/09/17  0347 01/08/17  1910 01/08/17  1029   Troponin I 0.04 0.10* 0.03     HEMATOLOGY:    Recent Labs  Lab 01/11/17  0438 01/09/17  0347 01/08/17  1029   WBC 8.93 15.57* 26.48*   Hgb 9.2* 9.1* 10.5*   Hematocrit 29.6* 29.3* 34.7*   MCV 85.1 85.2 88.3   MCH 26.4* 26.5* 26.7*   MCHC 31.1* 31.1* 30.3*   Platelets 220 268 349           Invalid input(s):  APTT  Invalid input(s): ARTERIAL BLOOD GAS    URINALYSIS:    Recent Labs  Lab 01/08/17  1129   Urine Type Clean Catch   Color, UA Amber*   Clarity, UA Sl Cloudy*   Specific Gravity UA 1.017   Urine pH 6.0   Nitrite, UA Negative   Ketones UA Negative   Urobilinogen, UA Negative   Bilirubin, UA Negative   Blood, UA Large*   RBC, UA TNTC*   WBC,  UA TNTC*     MICROBIOLOGY:  Microbiology Results     Procedure Component Value Units Date/Time    Bacterial ID & Sens Non-Urine Isolate [[id number]] Collected:  01/08/17 1029     Updated:  01/12/17 1334    Narrative:       [id number] called Micro Results of_pos bld. Results read back [id number], by [id number]  on 01/10/2017 at 00:36  Source: Blood  Test code: 95621H  Submitted: BAP  Send isolate slant    Blood Culture Aerobic/Anaerobic #1 [[id number]] Collected:  01/08/17 1029    Specimen:  Arm from Blood Updated:  01/13/17 1338    Narrative:       [id number] called Micro Results of_pos bld. Results read back [id number], by [id number]  on 01/10/2017 at 00:36  Source: Blood  Test code: 01027O  Submitted: BAP  ORDER#: [id number]                                    ORDERED BY: Lehman Prom  SOURCE: Blood arm                                    COLLECTED:  01/08/17 10:29  ANTIBIOTICS AT COLL.:  RECEIVED :  01/08/17 15:23  [id number] called Micro Results of_pos bld. Results read back [id number], by [phone] on 01/10/2017 at 00:36  Culture Blood Aerobic and Anaerobic        PRELIM      01/13/17 13:38   +  01/09/17   No Growth after 1 day/s of incubation.  01/10/17   Anaerobic Blood Culture Positive in 24 to 48 hours             Gram Stain Shows: Gram positive rods             Identification to follow  01/12/17   Aerobic Blood Culture Positive after 4 days             Gram Stain Shows: Gram positive rods             Identification to follow  01/11/17   Growth of Gram-positive rod               Identification to follow             Isolate sent to reference lab    Blood Culture Aerobic/Anaerobic #2 [[id number]] Collected:  01/08/17 1029    Specimen:  Arm from Blood Updated:  01/13/17 1552    Narrative:       [id number]_ called Micro Results of_pos bld. Results read back [id number], by [id number]  on 01/09/2017 at 21:45  ORDER#: [id number]                                    ORDERED BY: Lehman Prom  SOURCE: Blood arm                                     COLLECTED:  01/08/17 10:29  ANTIBIOTICS AT COLL.:                                RECEIVED :  01/08/17 15:23  [id number]_ called Micro Results of_pos bld. Results read back [id number], by [id number] on 01/09/2017 at 21:45  Culture Blood Aerobic and Anaerobic        PRELIM      01/13/17 15:52   +  01/09/17   Anaerobic Blood Culture Positive in 24 to 48 hours             Gram Stain Shows: Gram positive rods  01/11/17   Aerobic culture no growth to date, final report to follow  01/13/17   Aerobic Blood Culture No Growth  01/11/17   Growth of Gram-positive rod             Identification to follow    CULTURE BLOOD AEROBIC AND ANAEROBIC [[id number]] Collected:  01/13/17 1646    Specimen:  Blood, Venipuncture Updated:  01/13/17 2015    Narrative:       1 BLUE+1 PURPLE    MRSA Culture [[id number]] Collected:  01/08/17 1029    Specimen:  Body Fluid from Nasal/Throat ASC Admission Updated:  01/09/17 1653    Narrative:       ORDER#: [id number]                                    ORDERED BY: Lehman Prom  SOURCE: Nares  and Throat                             COLLECTED:  01/08/17 10:29  ANTIBIOTICS AT COLL.:                                RECEIVED :  01/08/17 15:48  Culture MRSA Surveillance                  FINAL       01/09/17 16:53  01/09/17   Negative for Methicillin Resistant Staph aureus from Nares and             Negative for Methicillin Resistant Staph aureus from Throat      MRSA culture [[id number]] Collected:  01/09/17 0423    Specimen:  Body Fluid from Nasal/Throat ASC Admission Updated:  01/11/17 0950    Narrative:       ORDER#: [id number]                                    ORDERED BY: AL KHOURI, SAMI  SOURCE: Nares and Throat                             COLLECTED:  01/09/17 04:23  ANTIBIOTICS AT COLL.:                                RECEIVED :  01/09/17 10:27  Culture MRSA Surveillance                  FINAL       01/11/17 09:50   +  01/11/17   Positive for Methicillin Resistant Staph aureus from Nares              Negative for Methicillin Resistant Staph aureus from Throat      Sputum Culture [[id number]] Collected:  01/08/17 1129    Specimen:  Sputum from Tracheal Aspirate Updated:  01/11/17 1448    Narrative:       ORDER#: [id number]                                    ORDERED BY: Lehman Prom  SOURCE: Tracheal Aspirate trach                      COLLECTED:  01/08/17 11:29  ANTIBIOTICS AT COLL.:                                RECEIVED :  01/08/17 16:01  Stain, Gram (Respiratory)                  FINAL       01/08/17 17:23  01/08/17   Few WBC's             Rare WBC's             Moderate Mixed Respiratory Flora  Culture and Gram Stain, Aerobic, RespiratorFINAL       01/11/17 14:48   +  01/09/17   Heavy growth of mixed upper respiratory flora  01/11/17   Heavy growth of Serratia marcescens               Morganella, Serratia, Providencia, Hafnia, Citrobacter, and             Enterobacter species may develop resistance while on therapy             with B-lactam antibiotics, even if initially susceptible, due             to derepression of AmpC B-lactamase. CLSI Z610-R60,             [facility]imicrobial Subcommittee June 2015  01/11/17   Heavy growth of Pseudomonas aeruginosa  __________________________________________________________________________                                  S.marcescens    P.aeruginosa    ANTIBIOTICS                     MIC  INTRP      MIC  INTRP      _____________________________________________________________________________  Amikacin                                        <=8    S        Amoxicillin/CA                 >16/8   R                        Ampicillin                      >16    R                        Aztreonam                       <=2    S         4     S        Cefazolin                       >16    R                        Cefepime                        <=1    S         2     S        Ceftazidime                     <=2    S        <=2    S        Ceftriaxone                      <=1    S                        Ciprofloxacin  2     I         2     I        Ertapenem                     <=0.25   S                        Gentamicin                      <=2    S        <=2    S        Levofloxacin                    <=1    S         4     I        Meropenem                     <=0.25   S         8     R        Piperacillin/Tazobactam        <=4/4   S        4/4    S        Tetracycline                    >8     R                        Tobramycin                                      <=2    S        _____________________________________________________________________________            S=SUSCEPTIBLE     I=INTERMEDIATE     R=RESISTANT                            N/S=NON-SUSCEPTIBLE  _____________________________________________________________________________      Urine culture [[id number]] Collected:  01/08/17 1129    Specimen:  Urine from Urine, Catheterized, In & Out Updated:  01/10/17 1517    Narrative:       ORDER#: [id number]                                    ORDERED BY: Lehman Prom  SOURCE: Urine, Catheterized, In & Out                COLLECTED:  01/08/17 11:29  ANTIBIOTICS AT COLL.:                                RECEIVED :  01/08/17 15:48  Culture Urine                              FINAL       01/10/17 15:17   +  01/10/17   >100,000 CFU/ML MDR Proteus mirabilis  Cefazolin results predict results for the oral agents             cefuroxime axetil, cephalexin, cefdinir, cefpodoxime, and             cefprozil when used for therapy of uncomplicated UTIs due to             E.coli, K. pneumonia, and P. mirabilis. CLSI M100 S25             This multidrug resistant (MDR) Enterobacteriaceae is resistant             to ceftriaxone and may not respond optimally to B-lactam             antibiotics (excluding carbapenems).             [facility] Antimicrobial Subcommittee June 2015  ____________________________________________________________________________                                 MDR P.mirabilis   ANTIBIOTICS                     MIC  INTRP      _____________________________________________________________________________  Amikacin                        >32    R        Amoxicillin/CA                 <=4/2   S        Ampicillin                      >16    R        Aztreonam                       >16    R        Cefazolin                       >16    R        Cefepime                        >16    R        Ceftazidime                     >16    R        Ceftriaxone                     >32    R        Ciprofloxacin                   >2     R        Ertapenem                     <=0.25   S        Gentamicin                      >8     R        Levofloxacin                    >  4     R        Nitrofurantoin                  >64    R  D1    Piperacillin/Tazobactam        <=2/4   S        Tetracycline                    >8     R        Tobramycin                      >8     R        Trimethoprim/Sulfamethoxazole  >2/38   R          -----DRUG COMMENTS----------    D1:  Nitrofurantoin should only be used for the treatment of         uncomplicated cystitis.         [facility] Antimicrobial Subcommittee June 2015  _____________________________________________________________________________            S=SUSCEPTIBLE     I=INTERMEDIATE     R=RESISTANT                            N/S=NON-SUSCEPTIBLE  _____________________________________________________________________________        Radiology Results:   Radiological Procedure reviewed.  Xr Abdomen Ap    Result Date: 12/16/2016  Nonspecific bowel gas pattern Lorinda Creed, MD 12/16/2016 7:13 PM    Ct Head Wo Contrast    Result Date: 01/08/2017    No acute intracranial abnormality. Wyatt Portela, MD 01/08/2017 11:16 AM    Xr Chest Ap Portable    Result Date: 01/10/2017   Technically limited portable study. Increased bilateral pleural effusions and bibasilar opacification. Heron Nay, MD 01/10/2017 8:01 AM    Xr Chest  Ap  Portable    Result Date: 01/08/2017    Interval improvement of the vascular congestion and pulmonary edema and decrease in size of the pleural effusions. Heron Nay, MD 01/08/2017 11:29 AM    Xr Chest Ap Portable    Result Date: 12/25/2016   Overall stable exam with pulmonary vascular engorgement and layering bilateral pleural effusions. Stable bibasilar opacities in keeping with some combination of atelectasis, layering pleural effusion, and/or lower lobe infiltrates. Gustavus Messing, MD 12/25/2016 7:51 AM    Xr Chest Ap Portable    Result Date: 12/24/2016  1. Worsening pulmonary vascular congestion. Enlargement of bilateral pleural effusions. 2. Consolidation throughout the lung bases which may be secondary to layering pleural fluid, subsegmental atelectatic changes and/or infiltrates.  Fonnie Mu, MD 12/24/2016 8:41 AM    Xr Chest Ap Portable    Result Date: 12/23/2016   Stable multifocal airspace disease, most likely CHF/fluid overload although nonspecific, with continued moderate bilateral pleural effusions and underlying lower lobe consolidation likely atelectasis. No change from XR CHEST AP PORTABLE with report dated 12/22/2016 9:53 AM. Laurena Slimmer, MD 12/23/2016 8:15 AM    Xr Chest Ap Portable    Result Date: 12/22/2016   Improvement in pulmonary edema with continued moderate bilateral pleural effusions and underlying lower lobe consolidation likely atelectasis. Laurena Slimmer, MD 12/22/2016 9:53 AM    Xr Chest Ap Portable    Result Date: 12/21/2016   Findings consistent with volume overload, overall stable.  Djamil  Fertikh,  MD 12/21/2016 7:51 AM    Xr Chest Ap Portable    Result Date: 12/20/2016  1. There is no overall change in the patients pleural effusion and hypoventilation bilaterally. 2. PICC line is noted in position without complication. Charlene Brooke, MD 12/20/2016 7:52 AM    Xr Chest Ap Portable    Result Date: 12/19/2016   Stable examination with bilateral hazy parenchymal opacification and pleural  effusions likely due to CHF/fluid overload No change from XR CHEST AP PORTABLE with report dated 12/18/2016 8:38 AM. Laurena Slimmer, MD 12/19/2016 8:17 AM    Xr Chest Ap Portable    Result Date: 12/18/2016   Stable examination with bilateral hazy parenchymal opacification and pleural effusions. Cannot exclude underlying pulmonary edema. Mitali  Bapna, MD 12/18/2016 8:38 AM    Xr Chest Ap Portable    Result Date: 12/17/2016   Good position of intravascular catheter. No pneumothorax. Stable infiltrates and effusions most compatible with pulmonary vascular congestion. Kinnie Feil, MD 12/17/2016 1:58 PM    Xr Chest Ap Portable    Result Date: 12/17/2016   No significant interval change in bilateral pleural effusions and bibasilar opacities in keeping with compressive atelectasis, with concurrent infiltrates not excluded. Gustavus Messing, MD 12/17/2016 8:19 AM    Xr Chest Ap Portable    Result Date: 12/16/2016   No significant change with right larger than left pleural effusions and bibasilar atelectasis versus airspace disease.Filbert Schilder, MD 12/16/2016 8:30 AM    US Venous Up Extrem Duplex Dopp Uni Right    Addendum Date: 12/20/2016    IMPRESSION: Thrombus within one of the paired basilic vein branches of the distal aspect of the right upper arm, recommend repeat ultrasound in 10-14 days to ensure no propagation into the axillary vein. Anticoagulation is NOT warranted at this time. Ronalee Red, MD 12/20/2016 3:08 PM    Result Date: 12/20/2016   Thrombus within one of the paired basilic vein branches of the distal aspect of the right upper arm, recommend repeat ultrasound in 10-14 days to ensure no propagation into the axillary vein. Anticoagulation is now warranted at this time. This study was read in conjunction with Dr. Alphonzo Grieve. Ronalee Red, MD 12/19/2016 1:47 PM    G,j,g/j Tube Replacement    Result Date: 01/10/2017    Sinogram of the old gastrostomy tube tract allowing recanalization and redilation with  eventual replacement of 20 French MIC gastrostomy catheter in good position the stomach. The new catheter may be used immediately. Dara Lords, MD 01/10/2017 11:05 AM      Signed by: Ledora Bottcher M.D.  Spectra Link: [id number]  Office Phone Number : [phone]

## 2017-01-14 NOTE — Plan of Care (Signed)
Problem: Compromised Tissue integrity  Goal: Damaged tissue is healing and protected  Outcome: Progressing   01/14/17 0113   Goal/Interventions addressed this shift   Damaged tissue is healing and protected  Monitor/assess Braden scale every shift;Reposition patient every 2 hours and as needed unless able to reposition self;Increase activity as tolerated/progressive mobility;Relieve pressure to bony prominences for patients at moderate and high risk;Keep intact skin clean and dry;Use incontinence wipes for cleaning urine, stool and caustic drainage. Foley care as needed;Use bath wipes, not soap and water, for daily bathing;Avoid shearing injuries;Monitor external devices/tubes for correct placement to prevent pressure, friction and shearing;Provide wound care per wound care algorithm;Encourage use of lotion/moisturizer on skin;Monitor patient's hygiene practices;Consult/collaborate with wound care nurse   Patient was repositioned every 2 hours, skin kept clean and dry, bony prominences supported with pillows to maintain skin integrity. Will continue to monitor.

## 2017-01-14 NOTE — Progress Notes (Signed)
[facility] Diseases [facility]  Progress Note    01/14/2017   [name] [id number] is a 83 y.o. male,       Assessment:      Sepsis.   Gram-positive rods bacteremia (2/2)-identification still pending    Altered mental status resolved.   Chest x-ray-increased bilateral pleural effusions and bibasilar consolidation (01/10/2017)   Sputum culture- Serratia marcescens and Pseudomonas   Recently treated MDR organisms causing pneumonia   Pro-calcitonin level-6.7 (01/09/2017).   Pro-calcitonin level-3.9 (01/10/2017)   Urine culture-MDR Proteus mirabilis   Blood cultures no growth to date   History of quadriparesis secondary to neck fracture.   Status post tracheostomy andG-tube 6placement   Resident of [name]    Plan:      Continue Zosyn   Await final identification of gram-positive rods in the blood   Repeat pro calcitonin level   Repeat CBC   Follow-up chest x-ray.   Continue probiotics.   Monitor electrolytes and renal functions closely.   Monitor clinically.    ROS:     General: low-grade fever, no chills, no rigor,sleepy but arousable  HEENT: no neck pain, no throat pain, hard of hearing, tracheostomy in place  Endocrine:  no fatigue, no night sweats  Respiratory: no cough, shortness of breath, or wheezing   Cardiovascular: no chest pain   Gastrointestinal: no abdominal pain,no N/V/D  Genito-Urinary: no dysuria or hematuria   Musculoskeletal: no edema  Neurological: c/o generalized weakness   Dermatological: no rash, no ulcer    Physical Examination:     Blood pressure 97/56, pulse 94, temperature 99.9 F (37.7 C), temperature source Axillary, resp. rate 18, height 1.854 m (6' 0.99"), weight 98.7 kg (217 lb 9.5 oz), SpO2 99 %.     General Appearance: Comfortable, and in no acute distress.   HEENT: Pupils are equal, round, and reactive to light.    Lungs: Decreased breath sounds   Heart: regular rate and rhythm   Chest: Symmetric chest wall expansion.     Abdomen: soft ,non tender,no hepatosplenomegaly,good bowel sounds   Neurological: Paraplegia   Extremities: No edema    Laboratory And Diagnostic Studies:     No results for input(s): WBC, HGB, HCT, PLT in the last 72 hours.  No results for input(s): NA, K, CL, CO2, BUN, CREAT, GLU, CA in the last 72 hours.  No results for input(s): AST, ALT, ALKPHOS, PROT, ALB in the last 72 hours.    Current Meds:      Scheduled Meds: PRN Meds:        albuterol-ipratropium 3 mL Nebulization Q6H   amiodarone 200 mg Oral Daily   amLODIPine 5 mg Oral Daily   atorvastatin 20 mg per G tube QHS   balsam peru-castor oil (VENELEX)  Topical Q12H   bumetanide 0.5 mg per G tube Daily   famotidine 20 mg Oral QHS   glycopyrrolate 1 mg per G tube Daily   lactobacillus/streptococcus 1 capsule per G tube Daily   metoclopramide 5 mg per G tube Q8H   metoprolol tartrate 25 mg per G tube BID   piperacillin-tazobactam 4.5 g Intravenous Q6H   rivaroxaban 20 mg per G tube Daily with dinner   scopolamine 1 patch Transdermal Q72H       Continuous Infusions:     acetaminophen 650 mg Q4H PRN   Or     acetaminophen 650 mg Q4H PRN   naloxone 0.2 mg PRN   ondansetron 4 mg Q6H PRN  Or     ondansetron 4 mg Q6H PRN   oxyCODONE-acetaminophen 1 tablet Q6H PRN   senna 17.2 mg QD PRN         Benedetto Ryder A. Janalyn Rouse, M.D.  01/14/2017  9:21 AM

## 2017-01-14 NOTE — Progress Notes (Signed)
Nutritional Support Services  Nutrition Follow-Up    Kemo Spruce 83 y.o. male   MRN: [id number]    Summary of Nutrition Recommendations:    1. Continue Promote via PEG @ goal rate of 60 mls/hr.               Provides 1440 total kcals, 90 gm protein, and 1208 mls free water in 1440 total mls     2. Provide 3 packets Prosource daily              Each packet Prosource provides 15 gm protein and 60 kcal.                EN + Prosource provides 1620 total kcals, 135 gm protein, and 1208 mls free water in 1440 total mls.              Meets 100% estimated kcal and protein needs.     3. Flush tube with minimum of 30-50 ml Q 4 hours for tube patency- additional fluids for hydration per MD    4. Continue weight monitoring     5. Consider inclusion of additional bowel regimen if no BM x 24-48 hrs    Recommendations discussed with RN Chyrel Masson.  -----------------------------------------------------------------------------------------------------------------                                                      Assessment Data:     Nutrition: F/U to RDN full assessment 1/31. Pt sleeping during time of RDN visit, RDN did not disturb. Observed Promote infusing @ 60 mls/hr (goal rate). Spoke with RN Chyrel Masson, states pt tolerating TF well with no nausea/vomiting/loose stools/residuals noted. Noted administration of 28% estimated kcal/protein needs from Promote alone per pump hx, will continue to monitor pump infusion hx, weight hx, and results of further nutrition focused physical examinations for 2 qualifying criteria for malnutrition. Weight hx continue to suggest erratic weights with possible weight loss of 3% since last RDN visit. RDN to continue to monitor weight hx.    Hospital Admission: Pt with GPR bacteremia, PNA, G-tube malfunction, encephalopathy, AMS, tracheostomy/vent, acute on chronic respiratory failure, HTN, CAF    Medical Hx:  has a past medical history of C2 cervical fracture; Chronic pain; Conjunctivitis;  Constipation; Cutaneous abscess of buttock; Dysphagia; Hypertension; Insomnia; Kidney disorder; and Respiratory arrest.  PSH: has a past surgical history that includes Tracheostomy and Gastrostomy tube placement.     Diet Hx/Social Hx: from [place], single    Orders Placed This Encounter   Procedures   . Diet NPO effective now   . Tube feeding-CONTINUOUS     Enteral: Infusion per [name] Pump history:  Past 24 hours: 408 mL, 30 mL water flushes (28% estimated kcal and protein needs from Promote alone)    ANTHROPOMETRIC  Anthropometrics  Height: 185.4 cm (6' 0.99")  Weight: 98.7 kg (217 lb 9.5 oz)  Weight Change: -1.98  IBW/kg (Calculated) Male: 83.65 kg  IBW/kg (Calculated) Male: 74.95 kg  BMI (calculated): 29.5    Weight Monitoring 12/25/2016 12/26/2016 01/08/2017 01/08/2017 01/09/2017 01/10/2017 01/12/2017   Height - - - 185.4 cm 185.4 cm - -   Height Method - - - Estimated (No Data) - -   Weight 109.1 kg 109.1 kg 109 kg 97.75 kg 101.2 kg 100.699 kg 98.7 kg   Weight Method  Bed Scale Bed Scale - - Bed Scale - Bed Scale   BMI (calculated) - - - 28.5 kg/m2 29.5 kg/m2 - -     Weight History Summary: weight discrepancies continue during admission, suggests 3% weight loss x 4 days (1/31-2/3), will continue to monitor for trend (possibly due to changes in fluid status)    Physical Assessment:   Unable to perform nutrition focused physical examination due to pt sleeping, noted no signs of malnutrition on 1/31.   Edema: non pitting, generalized edema, per RN flowsheet  Skin: pressure injury stage 2 on sacrum, per RN flowsheet  GI function: + BM 2/2, per RN flowsheet  Pain: 2-7 x past 24 hrs, per RN flowsheet    ESTIMATED NEEDS  Estimated Energy Needs  Total Energy Estimated Needs: 1308-6578 kcals/day  Method for Estimating Needs: 15-20 kcals/kg ABW of 97.8kg (BMI 30, tele pt)    Estimated Protein Needs  Total Protein Estimated Needs: 122-166 gm/day  Method for Estimating Needs: 1.5-2.0 gm/kg IBW of 83kg (BMI 30, tele pt, stage  2 wound)    Fluid Needs  Method for Estimating Needs: 1 ml/kcal or per MD    Pertinent Medications: Reviewed; lipitor, risaquad, reglan, senokot PRN    Allergies   Allergen Reactions   . Dilaudid [Hydromorphone Hcl]      Pertinent labs: Reviewed; Glucose 82-121 x past 24 hrs    Learning Needs: None at this time                                                               Nutrition Diagnosis      Inadequate oral intake related to respiratory failure as evidenced by mechanical ventilation/NPO and pt awaiting PEG replacement. (Resolving - pt received 28% estimated kcal and protein needs x past 24 hrs per pump hx)    Will continue to monitor weight hx, pump infusion hx, and results of nutrition focused physical examinations for 2 qualifying criteria for malnutrition.                                                               Intervention     Nutrition recommendation -   1. Continue Promote via PEG @ goal rate of 60 mls/hr.               Provides 1440 total kcals, 90 gm protein, and 1208 mls free water in 1440 total mls     2. Provide 3 packets Prosource daily              Each packet Prosource provides 15 gm protein and 60 kcal.                EN + Prosource provides 1620 total kcals, 135 gm protein, and 1208 mls free water in 1440 total mls.              Meets 100% estimated kcal and protein needs.     3. Flush tube with minimum of 30-50 ml Q 4 hours for tube patency- additional fluids for hydration per MD  4. Continue weight monitoring     5. Consider inclusion of additional bowel regimen if no BM x 24-48 hrs     Goal: Pt will tolerate >80% TF goal rate x 24-48 hrs (Ongoing)                                                             Monitoring     Monitor EN tolerance, labs, GI function, and weight.                                                         Evaluation     Nutrition Risk Level: Moderate (will follow up within 7 days and PRN)     [name], RDN   Ext. [phone]

## 2017-01-15 ENCOUNTER — Encounter (INDEPENDENT_AMBULATORY_CARE_PROVIDER_SITE_OTHER): Payer: Self-pay

## 2017-01-15 LAB — GLUCOSE WHOLE BLOOD - POCT
Whole Blood Glucose POCT: 122 mg/dL — ABNORMAL HIGH (ref 70–100)
Whole Blood Glucose POCT: 114 mg/dL — ABNORMAL HIGH (ref 70–100)

## 2017-01-15 LAB — CBC AND DIFFERENTIAL
Absolute NRBC: 0 10*3/uL
Basophils Absolute Automated: 0.03 10*3/uL (ref 0.00–0.20)
Basophils Automated: 0.2 %
Eosinophils Absolute Automated: 0.36 10*3/uL (ref 0.00–0.70)
Eosinophils Automated: 2.5 %
Hematocrit: 31.6 % — ABNORMAL LOW (ref 42.0–52.0)
Hgb: 10.1 g/dL — ABNORMAL LOW (ref 13.0–17.0)
Immature Granulocytes Absolute: 0.18 10*3/uL — ABNORMAL HIGH
Immature Granulocytes: 1.3 %
Lymphocytes Absolute Automated: 1.49 10*3/uL (ref 0.50–4.40)
Lymphocytes Automated: 10.4 %
MCH: 26.5 pg — ABNORMAL LOW (ref 28.0–32.0)
MCHC: 32 g/dL (ref 32.0–36.0)
MCV: 82.9 fL (ref 80.0–100.0)
Monocytes Absolute Automated: 1.15 10*3/uL (ref 0.00–1.20)
Monocytes: 8 %
Neutrophils Absolute: 11.08 10*3/uL — ABNORMAL HIGH (ref 1.80–8.10)
Neutrophils: 77.6 %
Nucleated RBC: 0 /100 WBC (ref 0.0–1.0)
Platelets: 60 10*3/uL — ABNORMAL LOW (ref 140–400)
RBC: 3.81 10*6/uL — ABNORMAL LOW (ref 4.70–6.00)
RDW: 17 % — ABNORMAL HIGH (ref 12–15)
WBC: 14.29 10*3/uL — ABNORMAL HIGH (ref 3.50–10.80)

## 2017-01-15 LAB — PROCALCITONIN: Procalcitonin: 0.2 — ABNORMAL HIGH (ref 0.0–0.1)

## 2017-01-15 MED ORDER — SODIUM CHLORIDE 0.9 % IV SOLN
2.0000 g | Freq: Three times a day (TID) | INTRAVENOUS | Status: DC
Start: 2017-01-15 — End: 2017-01-19
  Administered 2017-01-15 – 2017-01-19 (×13): 2 g via INTRAVENOUS
  Filled 2017-01-15 (×16): qty 2

## 2017-01-15 NOTE — Plan of Care (Signed)
Problem: Pain  Goal: Pain at adequate level as identified by patient  Outcome: Progressing      Problem: Compromised Tissue integrity  Goal: Damaged tissue is healing and protected  Outcome: Progressing   01/15/17 0631   Goal/Interventions addressed this shift   Damaged tissue is healing and protected  Reposition patient every 2 hours and as needed unless able to reposition self;Keep intact skin clean and dry       Problem: Artificial Airway  Goal: Tracheostomy will be maintained   01/15/17 0631   Goal/Interventions addressed this shift   Tracheostomy will be maintained Suction secretions as needed;Keep head of bed at 30 degrees, unless contraindicated       Comments: Pt vss. Given prn pain medication according to CPOT, pt rested well. Tolerating TF well. No bm overnight. Small amount of secretions in line trach. Adequate UOP. Will monitor.

## 2017-01-15 NOTE — Progress Notes (Signed)
[facility]  INTERNAL MEDICINE PROGRESS NOTE    Date Time: 01/15/17 6:13 AM  Patient Name: [name],[name]    Problem List:      Sepsis   Gram Positive Rod Bacteremia.   Pneumonia ( sputum culture with Serrata & Pseudomonas)   Malfunction G-tube   Metabolic Encephalopathy   Tracheostomy/Vent.   Acute on chronic respiratory failure - trached and ventilated   Hypertension   Chronic Atrial Fibrillation    Plan:      Continue Ceftazidime for sputum orgnaisms per ID recommendations, on probiotics as well   Finished course of zosyn for urinary tract infection   Follow blood cultures and and tailor antibiotics accordingly   Leukocytosis has resolved   Monitor electrolytes and renal function   Supportive therapy with vent support and enteral feeding.    GI prophylaxis   DVT prophylaxis.   Discussed plan of care with nurses.   Discussed case with consultants.    Subjective:     [name] was admitted on 01/08/17 with sepsis, pneumonia and urinary tract infection.   EMR reviewed and patient seen. He is more alert today and smiling.     Medications:   Medications:   Scheduled Meds: PRN Meds:        albuterol-ipratropium 3 mL Nebulization Q6H   amiodarone 200 mg Oral Daily   amLODIPine 5 mg Oral Daily   atorvastatin 20 mg per G tube QHS   balsam peru-castor oil (VENELEX)  Topical Q12H   bumetanide 0.5 mg per G tube Daily   famotidine 20 mg Oral QHS   glycopyrrolate 1 mg per G tube Daily   lactobacillus/streptococcus 1 capsule per G tube Daily   metoclopramide 5 mg per G tube Q8H   metoprolol tartrate 25 mg per G tube BID   piperacillin-tazobactam 4.5 g Intravenous Q6H   rivaroxaban 20 mg per G tube Daily with dinner   scopolamine 1 patch Transdermal Q72H          acetaminophen 650 mg Q4H PRN   Or     acetaminophen 650 mg Q4H PRN   naloxone 0.2 mg PRN   ondansetron 4 mg Q6H PRN   Or     ondansetron 4 mg Q6H PRN   oxyCODONE-acetaminophen 1 tablet Q6H PRN   senna 17.2 mg QD PRN              Physical Exam:     VITAL SIGNS   Temp:  [98.9 F (37.2 C)-99.9 F (37.7 C)] 98.9 F (37.2 C)  Heart Rate:  [91-120] 120  Resp Rate:  [18-19] 18  BP: (92-105)/(50-63) 105/55  FiO2:  [30 %] 30 %  POCT Glucose Result (Read Only)  Avg: 122.4  Min: 82  Max: 154  SpO2: 99 %  No intake or output data in the 24 hours ending 01/15/17 0613      General: awake and repnsive   Heent: pinkish conjunctiva, anicteric sclera, dry mucus membrane   Neck: tracheostomy in place   Cvs: S1 & S 2 well heard, regular rate and rhythm   Chest: Clear to auscultation   Abdomen: Soft, non-tender, active bowel sounds.   Gus: Foley not present   Ext : no cyanosis, no edema   CNS: awake and more alert today    Laboratory Results:     CHEMISTRY:     Recent Labs  Lab 01/11/17  0438 01/10/17  0452 01/09/17  0347 01/08/17  1029   Glucose 109*  97 76 179*   BUN 24.0 29.0* 33.0* 32.0*   Creatinine 0.7 0.7 0.8 0.8   Calcium 8.2 8.1 8.2 9.1   Sodium 142 143 142 140   Potassium 3.6 3.3* 3.8 4.4   Chloride 109 111 108 101   CO2 25 26 23  30*   Albumin  --   --   --  2.5*   AST (SGOT)  --   --   --  24   ALT  --   --   --  24   Bilirubin, Total  --   --   --  0.4   Alkaline Phosphatase  --   --   --  67       Recent Labs  Lab 01/09/17  0347 01/08/17  1910 01/08/17  1029   Troponin I 0.04 0.10* 0.03     HEMATOLOGY:    Recent Labs  Lab 01/15/17  0131 01/11/17  0438 01/09/17  0347 01/08/17  1029   WBC 14.29* 8.93 15.57* 26.48*   Hgb 10.1* 9.2* 9.1* 10.5*   Hematocrit 31.6* 29.6* 29.3* 34.7*   MCV 82.9 85.1 85.2 88.3   MCH 26.5* 26.4* 26.5* 26.7*   MCHC 32.0 31.1* 31.1* 30.3*   Platelets 60* 220 268 349     URINALYSIS:    Recent Labs  Lab 01/08/17  1129   Urine Type Clean Catch   Color, UA Amber*   Clarity, UA Sl Cloudy*   Specific Gravity UA 1.017   Urine pH 6.0   Nitrite, UA Negative   Ketones UA Negative   Urobilinogen, UA Negative   Bilirubin, UA Negative   Blood, UA Large*   RBC, UA TNTC*   WBC, UA TNTC*     MICROBIOLOGY:  Microbiology  Results     Procedure Component Value Units Date/Time    Bacterial ID & Sens Non-Urine Isolate [[id number]] Collected:  01/08/17 1029     Updated:  01/12/17 1334    Narrative:       [id number] called Micro Results of_pos bld. Results read back [id number], by [id number]  on 01/10/2017 at 00:36  Source: Blood  Test code: 95621H  Submitted: BAP  Send isolate slant    Blood Culture Aerobic/Anaerobic #1 [[id number]] Collected:  01/08/17 1029    Specimen:  Arm from Blood Updated:  01/13/17 1338    Narrative:       [id number] called Micro Results of_pos bld. Results read back [id number], by [id number]  on 01/10/2017 at 00:36  Source: Blood  Test code: 01027O  Submitted: BAP  ORDER#: [id number]                                    ORDERED BY: Lehman Prom  SOURCE: Blood arm                                    COLLECTED:  01/08/17 10:29  ANTIBIOTICS AT COLL.:                                RECEIVED :  01/08/17 15:23  [id number] called Micro Results of_pos bld. Results read back [id number], by [phone] on 01/10/2017 at 00:36  Culture Blood Aerobic and Anaerobic        PRELIM  01/13/17 13:38   +  01/09/17   No Growth after 1 day/s of incubation.  01/10/17   Anaerobic Blood Culture Positive in 24 to 48 hours             Gram Stain Shows: Gram positive rods             Identification to follow  01/12/17   Aerobic Blood Culture Positive after 4 days             Gram Stain Shows: Gram positive rods             Identification to follow  01/11/17   Growth of Gram-positive rod               Identification to follow             Isolate sent to reference lab    Blood Culture Aerobic/Anaerobic #2 [[id number]] Collected:  01/08/17 1029    Specimen:  Arm from Blood Updated:  01/13/17 1552    Narrative:       [id number] called Micro Results of_pos bld. Results read back [id number], by [id number]  on 01/09/2017 at 21:45  ORDER#: [id number]                                    ORDERED BY: Lehman Prom  SOURCE: Blood arm                                    COLLECTED:  01/08/17  10:29  ANTIBIOTICS AT COLL.:                                RECEIVED :  01/08/17 15:23  [id number]_ called Micro Results of_pos bld. Results read back [id number], by [id number] on 01/09/2017 at 21:45  Culture Blood Aerobic and Anaerobic        PRELIM      01/13/17 15:52   +  01/09/17   Anaerobic Blood Culture Positive in 24 to 48 hours             Gram Stain Shows: Gram positive rods  01/11/17   Aerobic culture no growth to date, final report to follow  01/13/17   Aerobic Blood Culture No Growth  01/11/17   Growth of Gram-positive rod             Identification to follow    CULTURE BLOOD AEROBIC AND ANAEROBIC [[id number]] Collected:  01/13/17 1646    Specimen:  Blood, Venipuncture Updated:  01/13/17 2015    Narrative:       1 BLUE+1 PURPLE    MRSA Culture [[id number]] Collected:  01/08/17 1029    Specimen:  Body Fluid from Nasal/Throat ASC Admission Updated:  01/09/17 1653    Narrative:       ORDER#: [id number]                                    ORDERED BY: Lehman Prom  SOURCE: Nares and Throat                             COLLECTED:  01/08/17 10:29  ANTIBIOTICS AT COLL.:  RECEIVED :  01/08/17 15:48  Culture MRSA Surveillance                  FINAL       01/09/17 16:53  01/09/17   Negative for Methicillin Resistant Staph aureus from Nares and             Negative for Methicillin Resistant Staph aureus from Throat      MRSA culture [[id number]] Collected:  01/09/17 0423    Specimen:  Body Fluid from Nasal/Throat ASC Admission Updated:  01/11/17 0950    Narrative:       ORDER#: [id number]                                    ORDERED BY: AL KHOURI, SAMI  SOURCE: Nares and Throat                             COLLECTED:  01/09/17 04:23  ANTIBIOTICS AT COLL.:                                RECEIVED :  01/09/17 10:27  Culture MRSA Surveillance                  FINAL       01/11/17 09:50   +  01/11/17   Positive for Methicillin Resistant Staph aureus from Nares             Negative for Methicillin Resistant  Staph aureus from Throat      Sputum Culture [[id number]] Collected:  01/08/17 1129    Specimen:  Sputum from Tracheal Aspirate Updated:  01/11/17 1448    Narrative:       ORDER#: [id number]                                    ORDERED BY: Lehman Prom  SOURCE: Tracheal Aspirate trach                      COLLECTED:  01/08/17 11:29  ANTIBIOTICS AT COLL.:                                RECEIVED :  01/08/17 16:01  Stain, Gram (Respiratory)                  FINAL       01/08/17 17:23  01/08/17   Few WBC's             Rare WBC's             Moderate Mixed Respiratory Flora  Culture and Gram Stain, Aerobic, RespiratorFINAL       01/11/17 14:48   +  01/09/17   Heavy growth of mixed upper respiratory flora  01/11/17   Heavy growth of Serratia marcescens               Morganella, Serratia, Providencia, Hafnia, Citrobacter, and             Enterobacter species may develop resistance while on therapy             with B-lactam antibiotics, even if initially susceptible, due  to derepression of AmpC B-lactamase. CLSI B147-W29,             [facility]committee June 2015  01/11/17   Heavy growth of Pseudomonas aeruginosa  __________________________________________________________________________                                  S.marcescens    P.aeruginosa    ANTIBIOTICS                     MIC  INTRP      MIC  INTRP      _____________________________________________________________________________  Amikacin                                        <=8    S        Amoxicillin/CA                 >16/8   R                        Ampicillin                      >16    R                        Aztreonam                       <=2    S         4     S        Cefazolin                       >16    R                        Cefepime                        <=1    S         2     S        Ceftazidime                     <=2    S        <=2    S        Ceftriaxone                     <=1    S                         Ciprofloxacin                    2     I         2     I        Ertapenem                     <=0.25   S  Gentamicin                      <=2    S        <=2    S        Levofloxacin                    <=1    S         4     I        Meropenem                     <=0.25   S         8     R        Piperacillin/Tazobactam        <=4/4   S        4/4    S        Tetracycline                    >8     R                        Tobramycin                                      <=2    S        _____________________________________________________________________________            S=SUSCEPTIBLE     I=INTERMEDIATE     R=RESISTANT                            N/S=NON-SUSCEPTIBLE  _____________________________________________________________________________      Urine culture [[id number]] Collected:  01/08/17 1129    Specimen:  Urine from Urine, Catheterized, In & Out Updated:  01/10/17 1517    Narrative:       ORDER#: [id number]                                    ORDERED BY: Lehman Prom  SOURCE: Urine, Catheterized, In & Out                COLLECTED:  01/08/17 11:29  ANTIBIOTICS AT COLL.:                                RECEIVED :  01/08/17 15:48  Culture Urine                              FINAL       01/10/17 15:17   +  01/10/17   >100,000 CFU/ML MDR Proteus mirabilis               Cefazolin results predict results for the oral agents             cefuroxime axetil, cephalexin, cefdinir, cefpodoxime, and             cefprozil when used for therapy of uncomplicated UTIs due to             E.coli, K. pneumonia, and P. mirabilis. CLSI  M100 S25             This multidrug resistant (MDR) Enterobacteriaceae is resistant             to ceftriaxone and may not respond optimally to B-lactam             antibiotics (excluding carbapenems).             [place] [facility] Antimicrobial Subcommittee June 2015  ____________________________________________________________________________                                MDR  P.mirabilis   ANTIBIOTICS                     MIC  INTRP      _____________________________________________________________________________  Amikacin                        >32    R        Amoxicillin/CA                 <=4/2   S        Ampicillin                      >16    R        Aztreonam                       >16    R        Cefazolin                       >16    R        Cefepime                        >16    R        Ceftazidime                     >16    R        Ceftriaxone                     >32    R        Ciprofloxacin                   >2     R        Ertapenem                     <=0.25   S        Gentamicin                      >8     R        Levofloxacin                    >4     R        Nitrofurantoin                  >64    R  D1    Piperacillin/Tazobactam        <=2/4   S        Tetracycline                    >  8     R        Tobramycin                      >8     R        Trimethoprim/Sulfamethoxazole  >2/38   R          -----DRUG COMMENTS----------    D1:  Nitrofurantoin should only be used for the treatment of         uncomplicated cystitis.         [place] [facility] Antimicrobial Subcommittee June 2015  _____________________________________________________________________________            S=SUSCEPTIBLE     I=INTERMEDIATE     R=RESISTANT                            N/S=NON-SUSCEPTIBLE  _____________________________________________________________________________        Radiology Results:   Radiological Procedure reviewed.  Xr Abdomen Ap    Result Date: 12/16/2016  Nonspecific bowel gas pattern Lorinda Creed, MD 12/16/2016 7:13 PM    Ct Head Wo Contrast    Result Date: 01/08/2017    No acute intracranial abnormality. Wyatt Portela, MD 01/08/2017 11:16 AM    Xr Chest Ap Portable    Result Date: 01/10/2017   Technically limited portable study. Increased bilateral pleural effusions and bibasilar opacification. Heron Nay, MD 01/10/2017 8:01 AM    Xr Chest  Ap Portable    Result Date: 01/08/2017    Interval  improvement of the vascular congestion and pulmonary edema and decrease in size of the pleural effusions. Heron Nay, MD 01/08/2017 11:29 AM    Xr Chest Ap Portable    Result Date: 12/25/2016   Overall stable exam with pulmonary vascular engorgement and layering bilateral pleural effusions. Stable bibasilar opacities in keeping with some combination of atelectasis, layering pleural effusion, and/or lower lobe infiltrates. Gustavus Messing, MD 12/25/2016 7:51 AM    Xr Chest Ap Portable    Result Date: 12/24/2016  1. Worsening pulmonary vascular congestion. Enlargement of bilateral pleural effusions. 2. Consolidation throughout the lung bases which may be secondary to layering pleural fluid, subsegmental atelectatic changes and/or infiltrates.  Fonnie Mu, MD 12/24/2016 8:41 AM    Xr Chest Ap Portable    Result Date: 12/23/2016   Stable multifocal airspace disease, most likely CHF/fluid overload although nonspecific, with continued moderate bilateral pleural effusions and underlying lower lobe consolidation likely atelectasis. No change from XR CHEST AP PORTABLE with report dated 12/22/2016 9:53 AM. Laurena Slimmer, MD 12/23/2016 8:15 AM    Xr Chest Ap Portable    Result Date: 12/22/2016   Improvement in pulmonary edema with continued moderate bilateral pleural effusions and underlying lower lobe consolidation likely atelectasis. Laurena Slimmer, MD 12/22/2016 9:53 AM    Xr Chest Ap Portable    Result Date: 12/21/2016   Findings consistent with volume overload, overall stable.  Georgana Curio, MD 12/21/2016 7:51 AM    Xr Chest Ap Portable    Result Date: 12/20/2016  1. There is no overall change in the patients pleural effusion and hypoventilation bilaterally. 2. PICC line is noted in position without complication. Charlene Brooke, MD 12/20/2016 7:52 AM    Xr Chest Ap Portable    Result Date: 12/19/2016   Stable examination with bilateral hazy parenchymal opacification and pleural effusions likely due to CHF/fluid overload No  change from XR CHEST AP PORTABLE with report dated 12/18/2016 8:38 AM. Laurena Slimmer, MD 12/19/2016 8:17 AM    Xr Chest Ap Portable    Result Date: 12/18/2016   Stable examination with bilateral hazy parenchymal opacification and pleural effusions. Cannot exclude underlying pulmonary edema. Mitali  Bapna, MD 12/18/2016 8:38 AM    Xr Chest Ap Portable    Result Date: 12/17/2016   Good position of intravascular catheter. No pneumothorax. Stable infiltrates and effusions most compatible with pulmonary vascular congestion. Kinnie Feil, MD 12/17/2016 1:58 PM    Xr Chest Ap Portable    Result Date: 12/17/2016   No significant interval change in bilateral pleural effusions and bibasilar opacities in keeping with compressive atelectasis, with concurrent infiltrates not excluded. Gustavus Messing, MD 12/17/2016 8:19 AM    Xr Chest Ap Portable    Result Date: 12/16/2016   No significant change with right larger than left pleural effusions and bibasilar atelectasis versus airspace disease.Filbert Schilder, MD 12/16/2016 8:30 AM    US Venous Up Extrem Duplex Dopp Uni Right    Addendum Date: 12/20/2016    IMPRESSION: Thrombus within one of the paired basilic vein branches of the distal aspect of the right upper arm, recommend repeat ultrasound in 10-14 days to ensure no propagation into the axillary vein. Anticoagulation is NOT warranted at this time. Ronalee Red, MD 12/20/2016 3:08 PM    Result Date: 12/20/2016   Thrombus within one of the paired basilic vein branches of the distal aspect of the right upper arm, recommend repeat ultrasound in 10-14 days to ensure no propagation into the axillary vein. Anticoagulation is now warranted at this time. This study was read in conjunction with Dr. Alphonzo Grieve. Ronalee Red, MD 12/19/2016 1:47 PM    G,j,g/j Tube Replacement    Result Date: 01/10/2017    Sinogram of the old gastrostomy tube tract allowing recanalization and redilation with eventual replacement of 20 French MIC gastrostomy  catheter in good position the stomach. The new catheter may be used immediately. Dara Lords, MD 01/10/2017 11:05 AM      Signed by: Ledora Bottcher M.D.  Spectra Link: [id number]  Office Phone Number : [phone]

## 2017-01-15 NOTE — Progress Notes (Signed)
TCM Medicare Navigator    Patient currently admitted to [facility] under inpatient status. Medicare Focus Navigator will continue to monitor patient status for 30 days following index admission which occurred dates 12/14/2016 to 12/27/2016.      Debraann Livingstone BS   TCM Medicare Focus navigator   [phone]

## 2017-01-15 NOTE — Progress Notes (Addendum)
Infectious Diseases & Tropical Medicine  Progress Note    01/15/2017   Courvoisier Hamblen [id number] is a 83 y.o. male,       Assessment:      Sepsis.   Gram-positive rods bacteremia (2/2)-identification still pending    Altered mental status resolved.   Chest x-ray-increased bilateral pleural effusions and bibasilar consolidation (01/10/2017)   Sputum culture- Serratia marcescens and Pseudomonas   Recently treated MDR organisms causing pneumonia   Pro-calcitonin level-6.7 (01/09/2017).   Pro-calcitonin level-3.9 (01/10/2017)   Urine culture-MDR Proteus mirabilis-Status post 5 days course of treatment (gentamicin and Zosyn)   Blood cultures no growth to date.   Leukocytosis without left shift   History of quadriparesis secondary to neck fracture.   Status post tracheostomy andG-tube placement   Resident of [name]    Plan:      Discontinue Zosyn as patient has completed 5 days course of treatment for urinary tract infection.   Start ceftazidime to cover for organisms in the sputum   Repeat pro calcitonin level pending.   Follow-up chest x-ray   Await final identification of gram-positive rods in the blood   Continue probiotics.   Monitor electrolytes and renal functions closely.   Monitor clinically.   Discussed with pharmacy.   Discussed with Dr.Gebreyesus    ROS:     General: No fever, no chills, no rigor, awake and alert  HEENT: no neck pain, no throat pain, hard of hearing, tracheostomy in place  Endocrine:  no fatigue, no night sweats  Respiratory: no cough, shortness of breath, or wheezing   Cardiovascular: no chest pain   Gastrointestinal: no abdominal pain,no N/V/D  Genito-Urinary: no dysuria or hematuria   Musculoskeletal: no edema  Neurological: c/o generalized weakness   Dermatological: no rash, no ulcer    Physical Examination:     Blood pressure 119/55, pulse (!) 124, temperature 97.6 F (36.4 C), temperature source Axillary, resp. rate 18, height 1.854 m  (6' 0.99"), weight 98.7 kg (217 lb 9.5 oz), SpO2 99 %.     General Appearance: Comfortable, and in no acute distress.   HEENT: Pupils are equal, round, and reactive to light.    Lungs: Clear to auscultation   Heart: regular rate and rhythm   Chest: Symmetric chest wall expansion.    Abdomen: soft ,non tender,no hepatosplenomegaly,good bowel sounds   Neurological: Paraplegia   Extremities: No edema    Laboratory And Diagnostic Studies:     Recent Labs      01/15/17   0131   WBC  14.29*   Hgb  10.1*   Hematocrit  31.6*   Platelets  60*     No results for input(s): NA, K, CL, CO2, BUN, CREAT, GLU, CA in the last 72 hours.  No results for input(s): AST, ALT, ALKPHOS, PROT, ALB in the last 72 hours.    Current Meds:      Scheduled Meds: PRN Meds:        albuterol-ipratropium 3 mL Nebulization Q6H   amiodarone 200 mg Oral Daily   amLODIPine 5 mg Oral Daily   atorvastatin 20 mg per G tube QHS   balsam peru-castor oil (VENELEX)  Topical Q12H   bumetanide 0.5 mg per G tube Daily   famotidine 20 mg Oral QHS   glycopyrrolate 1 mg per G tube Daily   lactobacillus/streptococcus 1 capsule per G tube Daily   metoclopramide 5 mg per G tube Q8H   metoprolol tartrate 25 mg per G  tube BID   piperacillin-tazobactam 4.5 g Intravenous Q6H   rivaroxaban 20 mg per G tube Daily with dinner   scopolamine 1 patch Transdermal Q72H       Continuous Infusions:     acetaminophen 650 mg Q4H PRN   Or     acetaminophen 650 mg Q4H PRN   naloxone 0.2 mg PRN   ondansetron 4 mg Q6H PRN   Or     ondansetron 4 mg Q6H PRN   oxyCODONE-acetaminophen 1 tablet Q6H PRN   senna 17.2 mg QD PRN         Tanairy Payeur A. Janalyn Rouse, M.D.  01/15/2017  9:32 AM

## 2017-01-15 NOTE — Plan of Care (Signed)
Problem: Infection  Goal: Free from infection  Outcome: Progressing   01/15/17 1848   OTHER   Free from infection  Assess for signs/symptoms of infection;Utilize isolation precautions per protocol/policy;Consult/collaborate with Infection Preventionist       Comments: Patient on IV abx. Episodes of in continent urine and stool- baseline for patient. Afebrile. No significant changes in vital signs or assessments. On cardiac monitor. Parameters set.

## 2017-01-16 LAB — GLUCOSE WHOLE BLOOD - POCT
Whole Blood Glucose POCT: 109 mg/dL — ABNORMAL HIGH (ref 70–100)
Whole Blood Glucose POCT: 129 mg/dL — ABNORMAL HIGH (ref 70–100)
Whole Blood Glucose POCT: 131 mg/dL — ABNORMAL HIGH (ref 70–100)

## 2017-01-16 MED ORDER — QUETIAPINE FUMARATE 25 MG PO TABS
25.0000 mg | ORAL_TABLET | Freq: Every evening | ORAL | Status: DC
Start: 2017-01-16 — End: 2017-01-30
  Administered 2017-01-16 – 2017-01-29 (×14): 25 mg via ORAL
  Filled 2017-01-16 (×14): qty 1

## 2017-01-16 NOTE — Plan of Care (Signed)
Problem: Infection  Goal: Free from infection  Outcome: Progressing   01/16/17 1059   OTHER   Free from infection  Assess for signs/symptoms of infection;Utilize isolation precautions per protocol/policy;Consult/collaborate with Infection Preventionist

## 2017-01-16 NOTE — UM Notes (Signed)
Primary Payor: MEDICARE/MEDICARE PART A AND B   Secondary Payor: ANTHEM/ANTHEM INDEMNITY   Concurrent 01/16/17    [name], an 83 y/o male    BP 107/66   Pulse 100   Temp 99.1 F (37.3 C) (Axillary)   Resp 18   Ht 1.854 m (6' 0.99")   Wt 98.7 kg (217 lb 9.5 oz)   SpO2 100%   BMI 28.71 kg/m     Temp:  [98.8 F (37.1 C)-99.4 F (37.4 C)]   Heart Rate:  [100-119]   Resp Rate:  [18-20]   BP: (107-126)/(66-104)   SpO2:  [95 %-100 %]     Lab Results last 48 Hours     Procedure Component Value Units Date/Time    Glucose Whole Blood - POCT [[id number]]  (Abnormal) Collected:  01/16/17 1145     Updated:  01/16/17 1302     POCT - Glucose Whole blood 131 (H) mg/dL     Glucose Whole Blood - POCT [[id number]]  (Abnormal) Collected:  01/16/17 0023     Updated:  01/16/17 0036     POCT - Glucose Whole blood 129 (H) mg/dL     Glucose Whole Blood - POCT [[id number]]  (Abnormal) Collected:  01/15/17 1634     Updated:  01/15/17 1743     POCT - Glucose Whole blood 114 (H) mg/dL     Procalcitonin [[id number]]  (Abnormal) Collected:  01/15/17 0131     Updated:  01/15/17 1307     Procalcitonin 0.2 (H)    Glucose Whole Blood - POCT [[id number]]  (Abnormal) Collected:  01/15/17 0835     Updated:  01/15/17 0839     POCT - Glucose Whole blood 122 (H) mg/dL     CBC and differential [[id number]]  (Abnormal) Collected:  01/15/17 0131    Specimen:  Blood from Blood Updated:  01/15/17 0246     WBC 14.29 (H) x10 3/uL      Hgb 10.1 (L) g/dL      Hematocrit 53.6 (L) %      Platelets 60 (L) x10 3/uL      RBC 3.81 (L) x10 6/uL      MCH 26.5 (L) pg      RDW 17 (H) %      Neutrophils Absolute 11.08 (H) x10 3/uL      Absolute Immature Granulocyte 0.18 (H) x10 3/uL      Absolute NRBC 0.00 x10 3/uL     Glucose Whole Blood - POCT [[id number]]  (Abnormal) Collected:  01/14/17 1756     Updated:  01/14/17 1822     POCT - Glucose Whole blood 110 (H) mg/dL         RN note 04/18/55  Pt restless at times, given prn pain medication. Tolerating TF well. Pt desat  in 70s during shift, trach care performed, inner cannular changed,  managed secretions, bagged ~ 5 min. Increased FiO2. Incontinent of bowel and urine..... Pt turned Q2H.     ID MD note 01/16/17  Assessment:   Sepsis.   Gram-positive rods bacteremia (2/2)-identification still pending    Altered mental status resolved.   Chest x-ray-increased bilateral pleural effusions and bibasilar consolidation (01/10/2017)   Sputum culture- Serratia marcescens and Pseudomonas   Pro-calcitonin level-6.7 (01/09/2017).   Pro-calcitonin level-3.9 (01/10/2017)   Pro-calcitonin level-0.2 (01/15/2017)   Urine culture-MDR Proteus mirabilis-Status post 5 days course of treatment (gentamicin and Zosyn).   Foley catheter removed   Blood cultures no growth to date.  Leukocytosis without left shift   History of quadriparesis secondary to neck fracture.   Status post tracheostomy andG-tube placement   Resident of [name]  Plan:   Continue ceftazidime   Follow-up chest x-ray   Await final identification of gram-positive rods in the blood   Continue probiotics.   Monitor electrolytes and renal functions closely.   Monitor clinically.      Scheduled Meds:  Current Facility-Administered Medications   Medication Dose Route Frequency   . albuterol-ipratropium  3 mL Nebulization Q6H   . amiodarone  200 mg Oral Daily   . amLODIPine  5 mg Oral Daily   . atorvastatin  20 mg per G tube QHS   . balsam peru-castor oil (VENELEX)   Topical Q12H   . bumetanide  0.5 mg per G tube Daily   . cefTAZidime  2 g Intravenous Q8H SCH   . famotidine  20 mg Oral QHS   . glycopyrrolate  1 mg per G tube Daily   . lactobacillus/streptococcus  1 capsule per G tube Daily   . metoclopramide  5 mg per G tube Q8H   . metoprolol tartrate  25 mg per G tube BID   . rivaroxaban  20 mg per G tube Daily with dinner   . scopolamine  1 patch Transdermal Q72H     Continuous Infusions:  PRN Meds:.acetaminophen **OR** acetaminophen, naloxone, ondansetron  **OR** ondansetron, oxyCODONE-acetaminophen, senna     Eddie North,  BSN, RN  Utilization Review Case Manager  Case Management Department  [facility]  T [phone] Judie Petit [phone]   Please submit all clinical review request via fax to [phone]

## 2017-01-16 NOTE — Progress Notes (Signed)
Infectious Diseases & Tropical Medicine  Progress Note    01/16/2017   Zackariah Vanderpol ZOX:[id number]:[id number] is a 83 y.o. male,       Assessment:      Sepsis.   Gram-positive rods bacteremia (2/2)-identification still pending    Altered mental status resolved.   Chest x-ray-increased bilateral pleural effusions and bibasilar consolidation (01/10/2017)   Sputum culture- Serratia marcescens and Pseudomonas   Pro-calcitonin level-6.7 (01/09/2017).   Pro-calcitonin level-3.9 (01/10/2017)   Pro-calcitonin level-0.2 (01/15/2017)   Urine culture-MDR Proteus mirabilis-Status post 5 days course of treatment (gentamicin and Zosyn).   Foley catheter removed   Blood cultures no growth to date.   Leukocytosis without left shift   History of quadriparesis secondary to neck fracture.   Status post tracheostomy andG-tube placement   Resident of [name]    Plan:      Continue ceftazidime   Follow-up chest x-ray   Await final identification of gram-positive rods in the blood   Continue probiotics.   Monitor electrolytes and renal functions closely.   Monitor clinically.    ROS:     General: No fever, no chills, no rigor,sleepy but arousable  HEENT: no neck pain, no throat pain, hard of hearing, tracheostomy in place  Endocrine:  no fatigue, no night sweats  Respiratory: no cough, shortness of breath, or wheezing   Cardiovascular: no chest pain   Gastrointestinal: no abdominal pain,no N/V/D  Genito-Urinary: no dysuria or hematuria   Musculoskeletal: no edema  Neurological: c/o generalized weakness   Dermatological: no rash, no ulcer    Physical Examination:     Blood pressure (!) 126/104, pulse (!) 118, temperature 98.8 F (37.1 C), temperature source Oral, resp. rate 18, height 1.854 m (6' 0.99"), weight 98.7 kg (217 lb 9.5 oz), SpO2 99 %.     General Appearance: Comfortable, and in no acute distress.   HEENT: Pupils are equal, round, and reactive to light.    Lungs: Clear to  auscultation   Heart: regular rate and rhythm   Chest: Symmetric chest wall expansion.    Abdomen: soft ,non tender,no hepatosplenomegaly,good bowel sounds   Neurological: Paraplegia   Extremities: No edema    Laboratory And Diagnostic Studies:     Recent Labs      01/15/17   0131   WBC  14.29*   Hgb  10.1*   Hematocrit  31.6*   Platelets  60*     No results for input(s): NA, K, CL, CO2, BUN, CREAT, GLU, CA in the last 72 hours.  No results for input(s): AST, ALT, ALKPHOS, PROT, ALB in the last 72 hours.    Current Meds:      Scheduled Meds: PRN Meds:        albuterol-ipratropium 3 mL Nebulization Q6H   amiodarone 200 mg Oral Daily   amLODIPine 5 mg Oral Daily   atorvastatin 20 mg per G tube QHS   balsam peru-castor oil (VENELEX)  Topical Q12H   bumetanide 0.5 mg per G tube Daily   cefTAZidime 2 g Intravenous Q8H SCH   famotidine 20 mg Oral QHS   glycopyrrolate 1 mg per G tube Daily   lactobacillus/streptococcus 1 capsule per G tube Daily   metoclopramide 5 mg per G tube Q8H   metoprolol tartrate 25 mg per G tube BID   rivaroxaban 20 mg per G tube Daily with dinner   scopolamine 1 patch Transdermal Q72H       Continuous Infusions:  acetaminophen 650 mg Q4H PRN   Or     acetaminophen 650 mg Q4H PRN   naloxone 0.2 mg PRN   ondansetron 4 mg Q6H PRN   Or     ondansetron 4 mg Q6H PRN   oxyCODONE-acetaminophen 1 tablet Q6H PRN   senna 17.2 mg QD PRN         Nannette Zill A. Janalyn Rouse, M.D.  01/16/2017  10:30 AM

## 2017-01-16 NOTE — Plan of Care (Signed)
Problem: Pain  Goal: Pain at adequate level as identified by patient  Outcome: Progressing   01/16/17 0531   Goal/Interventions addressed this shift   Pain at adequate level as identified by patient Identify patient comfort function goal;Reassess pain within 30-60 minutes of any procedure/intervention, per Pain Assessment, Intervention, Reassessment (AIR) Cycle;Evaluate if patient comfort function goal is met       Problem: Artificial Airway  Goal: Tracheostomy will be maintained   01/16/17 0531   Goal/Interventions addressed this shift   Tracheostomy will be maintained Suction secretions as needed;Keep head of bed at 30 degrees, unless contraindicated       Comments: Pt restless at times, given prn pain medication. Tolerating TF well. Pt desat in 70s during shift, trach care performed, inner cannular changed,  managed secretionsm bagged ~ 5 min. Increased FiO2. Incontinent of bowel and urine. Large loose BM. Pt turned Q2H.

## 2017-01-16 NOTE — Progress Notes (Signed)
[facility]  INTERNAL MEDICINE PROGRESS NOTE    Date Time: 01/16/17 7:08 AM  Patient Name: [name],[name]    Problem List:      Sepsis   Gram Positive Rod Bacteremia.   Pneumonia ( sputum culture with Serrata & Pseudomonas)   Malfunction G-tube   Metabolic Encephalopathy   Acute on chronic respiratory failure - trached and ventilated   Hypertension   Chronic Atrial Fibrillation    Plan:      Continue Ceftazidime for sputum orgnaisms per ID recommendations, on probiotics as well   Finished course of zosyn for urinary tract infection   Follow blood cultures and and tailor antibiotics accordingly   Leukocytosis has resolved   Monitor electrolytes and renal function   Supportive therapy with vent support and enteral feeding.    GI prophylaxis   DVT prophylaxis.   Discussed plan of care with nurses.   Discussed case with consultants.    Subjective:     [name] was admitted on 01/08/17 with sepsis, pneumonia and urinary tract infection.   EMR reviewed and patient seen.   He is alert and opens his eyes.     Medications:   Medications:   Scheduled Meds: PRN Meds:        albuterol-ipratropium 3 mL Nebulization Q6H   amiodarone 200 mg Oral Daily   amLODIPine 5 mg Oral Daily   atorvastatin 20 mg per G tube QHS   balsam peru-castor oil (VENELEX)  Topical Q12H   bumetanide 0.5 mg per G tube Daily   cefTAZidime 2 g Intravenous Q8H SCH   famotidine 20 mg Oral QHS   glycopyrrolate 1 mg per G tube Daily   lactobacillus/streptococcus 1 capsule per G tube Daily   metoclopramide 5 mg per G tube Q8H   metoprolol tartrate 25 mg per G tube BID   rivaroxaban 20 mg per G tube Daily with dinner   scopolamine 1 patch Transdermal Q72H          acetaminophen 650 mg Q4H PRN   Or     acetaminophen 650 mg Q4H PRN   naloxone 0.2 mg PRN   ondansetron 4 mg Q6H PRN   Or     ondansetron 4 mg Q6H PRN   oxyCODONE-acetaminophen 1 tablet Q6H PRN   senna 17.2 mg QD PRN             Physical Exam:     VITAL SIGNS    Temp:  [97.6 F (36.4 C)-99.4 F (37.4 C)] 98.8 F (37.1 C)  Heart Rate:  [111-119] 118  Resp Rate:  [18-20] 18  BP: (109-126)/(55-104) 126/104  FiO2:  [30 %-50 %] 50 %  POCT Glucose Result (Read Only)  Avg: 122.3  Min: 82  Max: 154  SpO2: 97 %    Intake/Output Summary (Last 24 hours) at 01/16/17 0708  Last data filed at 01/15/17 2000   Gross per 24 hour   Intake              120 ml   Output              350 ml   Net             -230 ml         General: awake and respnsive   Heent: pinkish conjunctiva, anicteric sclera, dry mucus membrane   Neck: tracheostomy in place   Cvs: S1 & S 2 well heard, regular rate and rhythm  Chest: Clear to auscultation   Abdomen: Soft, non-tender, active bowel sounds.   Ext : no cyanosis, no edema   CNS: awake and more alert today    Laboratory Results:     CHEMISTRY:     Recent Labs  Lab 01/11/17  0438 01/10/17  0452   Glucose 109* 97   BUN 24.0 29.0*   Creatinine 0.7 0.7   Calcium 8.2 8.1   Sodium 142 143   Potassium 3.6 3.3*   Chloride 109 111   CO2 25 26         HEMATOLOGY:    Recent Labs  Lab 01/15/17  0131 01/11/17  0438   WBC 14.29* 8.93   Hgb 10.1* 9.2*   Hematocrit 31.6* 29.6*   MCV 82.9 85.1   MCH 26.5* 26.4*   MCHC 32.0 31.1*   Platelets 60* 220     MICROBIOLOGY:  Microbiology Results     Procedure Component Value Units Date/Time    Bacterial ID & Sens Non-Urine Isolate [[id number]] Collected:  01/08/17 1029     Updated:  01/12/17 1334    Narrative:       [id number] called Micro Results of_pos bld. Results read back [id number], by [id number]  on 01/10/2017 at 00:36  Source: Blood  Test code: 63875I  Submitted: BAP  Send isolate slant    Blood Culture Aerobic/Anaerobic #1 [[id number]] Collected:  01/08/17 1029    Specimen:  Arm from Blood Updated:  01/13/17 1338    Narrative:       [id number] called Micro Results of_pos bld. Results read back [id number], by [id number]  on 01/10/2017 at 00:36  Source: Blood  Test code: 09323F  Submitted: BAP  ORDER#: [id number]                                     ORDERED BY: Lehman Prom  SOURCE: Blood arm                                    COLLECTED:  01/08/17 10:29  ANTIBIOTICS AT COLL.:                                RECEIVED :  01/08/17 15:23  [id number] called Micro Results of_pos bld. Results read back [id number], by [phone] on 01/10/2017 at 00:36  Culture Blood Aerobic and Anaerobic        PRELIM      01/13/17 13:38   +  01/09/17   No Growth after 1 day/s of incubation.  01/10/17   Anaerobic Blood Culture Positive in 24 to 48 hours             Gram Stain Shows: Gram positive rods             Identification to follow  01/12/17   Aerobic Blood Culture Positive after 4 days             Gram Stain Shows: Gram positive rods             Identification to follow  01/11/17   Growth of Gram-positive rod               Identification to follow             Isolate sent to reference lab  Blood Culture Aerobic/Anaerobic #2 [[id number]] Collected:  01/08/17 1029    Specimen:  Arm from Blood Updated:  01/13/17 1552    Narrative:       [id number]_ called Micro Results of_pos bld. Results read back [id number], by [id number]  on 01/09/2017 at 21:45  ORDER#: [id number]                                    ORDERED BY: Lehman Prom  SOURCE: Blood arm                                    COLLECTED:  01/08/17 10:29  ANTIBIOTICS AT COLL.:                                RECEIVED :  01/08/17 15:23  [id number]_ called Micro Results of_pos bld. Results read back [id number], by [id number] on 01/09/2017 at 21:45  Culture Blood Aerobic and Anaerobic        PRELIM      01/13/17 15:52   +  01/09/17   Anaerobic Blood Culture Positive in 24 to 48 hours             Gram Stain Shows: Gram positive rods  01/11/17   Aerobic culture no growth to date, final report to follow  01/13/17   Aerobic Blood Culture No Growth  01/11/17   Growth of Gram-positive rod             Identification to follow    CULTURE BLOOD AEROBIC AND ANAEROBIC [[id number]] Collected:  01/13/17 1646    Specimen:  Blood, Venipuncture Updated:  01/13/17 2015     Narrative:       1 BLUE+1 PURPLE    MRSA Culture [[id number]] Collected:  01/08/17 1029    Specimen:  Body Fluid from Nasal/Throat ASC Admission Updated:  01/09/17 1653    Narrative:       ORDER#: [id number]                                    ORDERED BY: Lehman Prom  SOURCE: Nares and Throat                             COLLECTED:  01/08/17 10:29  ANTIBIOTICS AT COLL.:                                RECEIVED :  01/08/17 15:48  Culture MRSA Surveillance                  FINAL       01/09/17 16:53  01/09/17   Negative for Methicillin Resistant Staph aureus from Nares and             Negative for Methicillin Resistant Staph aureus from Throat      MRSA culture [[id number]] Collected:  01/09/17 0423    Specimen:  Body Fluid from Nasal/Throat ASC Admission Updated:  01/11/17 0950    Narrative:       ORDER#: [id number]  ORDERED BY: AL KHOURI, SAMI  SOURCE: Nares and Throat                             COLLECTED:  01/09/17 04:23  ANTIBIOTICS AT COLL.:                                RECEIVED :  01/09/17 10:27  Culture MRSA Surveillance                  FINAL       01/11/17 09:50   +  01/11/17   Positive for Methicillin Resistant Staph aureus from Nares             Negative for Methicillin Resistant Staph aureus from Throat      Sputum Culture [[id number]] Collected:  01/08/17 1129    Specimen:  Sputum from Tracheal Aspirate Updated:  01/11/17 1448    Narrative:       ORDER#: [id number]                                    ORDERED BY: Lehman Prom  SOURCE: Tracheal Aspirate trach                      COLLECTED:  01/08/17 11:29  ANTIBIOTICS AT COLL.:                                RECEIVED :  01/08/17 16:01  Stain, Gram (Respiratory)                  FINAL       01/08/17 17:23  01/08/17   Few WBC's             Rare WBC's             Moderate Mixed Respiratory Flora  Culture and Gram Stain, Aerobic, RespiratorFINAL       01/11/17 14:48   +  01/09/17   Heavy growth of mixed upper respiratory  flora  01/11/17   Heavy growth of Serratia marcescens               Morganella, Serratia, Providencia, Hafnia, Citrobacter, and             Enterobacter species may develop resistance while on therapy             with B-lactam antibiotics, even if initially susceptible, due             to derepression of AmpC B-lactamase. CLSI H086-V78,             [place] [facility] June 2015  01/11/17   Heavy growth of Pseudomonas aeruginosa  __________________________________________________________________________                                  S.marcescens    P.aeruginosa    ANTIBIOTICS                     MIC  INTRP      MIC  INTRP      _____________________________________________________________________________  Amikacin                                        <=  8    S        Amoxicillin/CA                 >16/8   R                        Ampicillin                      >16    R                        Aztreonam                       <=2    S         4     S        Cefazolin                       >16    R                        Cefepime                        <=1    S         2     S        Ceftazidime                     <=2    S        <=2    S        Ceftriaxone                     <=1    S                        Ciprofloxacin                    2     I         2     I        Ertapenem                     <=0.25   S                        Gentamicin                      <=2    S        <=2    S        Levofloxacin                    <=1    S         4     I        Meropenem                     <=0.25   S         8     R        Piperacillin/Tazobactam        <=4/4   S  4/4    S        Tetracycline                    >8     R                        Tobramycin                                      <=2    S        _____________________________________________________________________________            S=SUSCEPTIBLE     I=INTERMEDIATE     R=RESISTANT                             N/S=NON-SUSCEPTIBLE  _____________________________________________________________________________      Urine culture [[id number]] Collected:  01/08/17 1129    Specimen:  Urine from Urine, Catheterized, In & Out Updated:  01/10/17 1517    Narrative:       ORDER#: [id number]                                    ORDERED BY: Lehman Prom  SOURCE: Urine, Catheterized, In & Out                COLLECTED:  01/08/17 11:29  ANTIBIOTICS AT COLL.:                                RECEIVED :  01/08/17 15:48  Culture Urine                              FINAL       01/10/17 15:17   +  01/10/17   >100,000 CFU/ML MDR Proteus mirabilis               Cefazolin results predict results for the oral agents             cefuroxime axetil, cephalexin, cefdinir, cefpodoxime, and             cefprozil when used for therapy of uncomplicated UTIs due to             E.coli, K. pneumonia, and P. mirabilis. CLSI M100 S25             This multidrug resistant (MDR) Enterobacteriaceae is resistant             to ceftriaxone and may not respond optimally to B-lactam             antibiotics (excluding carbapenems).             [place] [facility]im[facility] June 2015  ____________________________________________________________________________                                MDR P.mirabilis   ANTIBIOTICS                     MIC  INTRP      _____________________________________________________________________________  Amikacin                        >  32    R        Amoxicillin/CA                 <=4/2   S        Ampicillin                      >16    R        Aztreonam                       >16    R        Cefazolin                       >16    R        Cefepime                        >16    R        Ceftazidime                     >16    R        Ceftriaxone                     >32    R        Ciprofloxacin                   >2     R        Ertapenem                     <=0.25   S        Gentamicin                      >8     R        Levofloxacin                     >4     R        Nitrofurantoin                  >64    R  D1    Piperacillin/Tazobactam        <=2/4   S        Tetracycline                    >8     R        Tobramycin                      >8     R        Trimethoprim/Sulfamethoxazole  >2/38   R          -----DRUG COMMENTS----------    D1:  Nitrofurantoin should only be used for the treatment of         uncomplicated cystitis.         [place] [facility] Antim[facility] June 2015  _____________________________________________________________________________            S=SUSCEPTIBLE     I=INTERMEDIATE     R=RESISTANT  N/S=NON-SUSCEPTIBLE  _____________________________________________________________________________        Radiology Results:   Radiological Procedure reviewed.  Ct Head Wo Contrast    Result Date: 01/08/2017    No acute intracranial abnormality. Wyatt Portela, MD 01/08/2017 11:16 AM    Xr Chest Ap Portable    Result Date: 01/10/2017   Technically limited portable study. Increased bilateral pleural effusions and bibasilar opacification. Heron Nay, MD 01/10/2017 8:01 AM    Xr Chest  Ap Portable    Result Date: 01/08/2017    Interval improvement of the vascular congestion and pulmonary edema and decrease in size of the pleural effusions. Heron Nay, MD 01/08/2017 11:29 AM    Xr Chest Ap Portable    Result Date: 12/25/2016   Overall stable exam with pulmonary vascular engorgement and layering bilateral pleural effusions. Stable bibasilar opacities in keeping with some combination of atelectasis, layering pleural effusion, and/or lower lobe infiltrates. Gustavus Messing, MD 12/25/2016 7:51 AM    Xr Chest Ap Portable    Result Date: 12/24/2016  1. Worsening pulmonary vascular congestion. Enlargement of bilateral pleural effusions. 2. Consolidation throughout the lung bases which may be secondary to layering pleural fluid, subsegmental atelectatic changes and/or infiltrates.  Fonnie Mu, MD 12/24/2016 8:41  AM    Xr Chest Ap Portable    Result Date: 12/23/2016   Stable multifocal airspace disease, most likely CHF/fluid overload although nonspecific, with continued moderate bilateral pleural effusions and underlying lower lobe consolidation likely atelectasis. No change from XR CHEST AP PORTABLE with report dated 12/22/2016 9:53 AM. Laurena Slimmer, MD 12/23/2016 8:15 AM    Xr Chest Ap Portable    Result Date: 12/22/2016   Improvement in pulmonary edema with continued moderate bilateral pleural effusions and underlying lower lobe consolidation likely atelectasis. Laurena Slimmer, MD 12/22/2016 9:53 AM    Xr Chest Ap Portable    Result Date: 12/21/2016   Findings consistent with volume overload, overall stable.  Georgana Curio, MD 12/21/2016 7:51 AM    Xr Chest Ap Portable    Result Date: 12/20/2016  1. There is no overall change in the patients pleural effusion and hypoventilation bilaterally. 2. PICC line is noted in position without complication. Charlene Brooke, MD 12/20/2016 7:52 AM    Xr Chest Ap Portable    Result Date: 12/19/2016   Stable examination with bilateral hazy parenchymal opacification and pleural effusions likely due to CHF/fluid overload No change from XR CHEST AP PORTABLE with report dated 12/18/2016 8:38 AM. Laurena Slimmer, MD 12/19/2016 8:17 AM    Xr Chest Ap Portable    Result Date: 12/18/2016   Stable examination with bilateral hazy parenchymal opacification and pleural effusions. Cannot exclude underlying pulmonary edema. Mitali  Bapna, MD 12/18/2016 8:38 AM    Xr Chest Ap Portable    Result Date: 12/17/2016   Good position of intravascular catheter. No pneumothorax. Stable infiltrates and effusions most compatible with pulmonary vascular congestion. Kinnie Feil, MD 12/17/2016 1:58 PM    Xr Chest Ap Portable    Result Date: 12/17/2016   No significant interval change in bilateral pleural effusions and bibasilar opacities in keeping with compressive atelectasis, with concurrent infiltrates not excluded. Gustavus Messing, MD  12/17/2016 8:19 AM    US Venous Up Extrem Duplex Dopp Uni Right    Addendum Date: 12/20/2016    IMPRESSION: Thrombus within one of the paired basilic vein branches of the distal aspect of the right upper arm, recommend  repeat ultrasound in 10-14 days to ensure no propagation into the axillary vein. Anticoagulation is NOT warranted at this time. Ronalee Red, MD 12/20/2016 3:08 PM    Result Date: 12/20/2016   Thrombus within one of the paired basilic vein branches of the distal aspect of the right upper arm, recommend repeat ultrasound in 10-14 days to ensure no propagation into the axillary vein. Anticoagulation is now warranted at this time. This study was read in conjunction with Dr. Alphonzo Grieve. Ronalee Red, MD 12/19/2016 1:47 PM    G,j,g/j Tube Replacement    Result Date: 01/10/2017    Sinogram of the old gastrostomy tube tract allowing recanalization and redilation with eventual replacement of 20 French MIC gastrostomy catheter in good position the stomach. The new catheter may be used immediately. Dara Lords, MD 01/10/2017 11:05 AM      Signed by: Ledora Bottcher M.D.  [facility] Link: [id number]  Office Phone Number : [phone]

## 2017-01-17 LAB — BASIC METABOLIC PANEL
Anion Gap: 7 (ref 5.0–15.0)
BUN: 26 mg/dL (ref 9.0–28.0)
CO2: 30 mEq/L — ABNORMAL HIGH (ref 22–29)
Calcium: 8.3 mg/dL (ref 7.9–10.2)
Chloride: 104 mEq/L (ref 100–111)
Creatinine: 0.7 mg/dL (ref 0.7–1.3)
Glucose: 117 mg/dL — ABNORMAL HIGH (ref 70–100)
Potassium: 3.9 mEq/L (ref 3.5–5.1)
Sodium: 141 mEq/L (ref 136–145)

## 2017-01-17 LAB — CBC AND DIFFERENTIAL
Absolute NRBC: 0 10*3/uL
Basophils Absolute Automated: 0.02 10*3/uL (ref 0.00–0.20)
Basophils Automated: 0.1 %
Eosinophils Absolute Automated: 0.15 10*3/uL (ref 0.00–0.70)
Eosinophils Automated: 1 %
Hematocrit: 29.8 % — ABNORMAL LOW (ref 42.0–52.0)
Hgb: 9.2 g/dL — ABNORMAL LOW (ref 13.0–17.0)
Immature Granulocytes Absolute: 0.15 10*3/uL — ABNORMAL HIGH
Immature Granulocytes: 1 %
Lymphocytes Absolute Automated: 0.97 10*3/uL (ref 0.50–4.40)
Lymphocytes Automated: 6.3 %
MCH: 26.4 pg — ABNORMAL LOW (ref 28.0–32.0)
MCHC: 30.9 g/dL — ABNORMAL LOW (ref 32.0–36.0)
MCV: 85.4 fL (ref 80.0–100.0)
MPV: 13.2 fL — ABNORMAL HIGH (ref 9.4–12.3)
Monocytes Absolute Automated: 1.13 10*3/uL (ref 0.00–1.20)
Monocytes: 7.3 %
Neutrophils Absolute: 13.1 10*3/uL — ABNORMAL HIGH (ref 1.80–8.10)
Neutrophils: 84.3 %
Nucleated RBC: 0 /100 WBC (ref 0.0–1.0)
Platelets: 151 10*3/uL (ref 140–400)
RBC: 3.49 10*6/uL — ABNORMAL LOW (ref 4.70–6.00)
RDW: 17 % — ABNORMAL HIGH (ref 12–15)
WBC: 15.52 10*3/uL — ABNORMAL HIGH (ref 3.50–10.80)

## 2017-01-17 LAB — HEMOLYSIS INDEX: Hemolysis Index: 7 (ref 0–18)

## 2017-01-17 LAB — GFR: EGFR: >60

## 2017-01-17 LAB — GLUCOSE WHOLE BLOOD - POCT: Whole Blood Glucose POCT: 96 mg/dL (ref 70–100)

## 2017-01-17 NOTE — Progress Notes (Signed)
Pt agitated overnight, not following commands and unable to reorient, pulling directly at trach and disconnecting from vent, orders for restraints and seroquel from on-call doctor. Pt slept after that and not pulling at equipment. Pt had multiple episodes of diarrhea, liquid stool brown/yellow, incontinent of both urine and stool. Pt turned and repositioned q2H. Pressure ulcer cleansed and new mepilex in place. Will continue to monitor.

## 2017-01-17 NOTE — Progress Notes (Signed)
Infectious Diseases [facility]  Progress Note    01/17/2017   [name] [name],[name] is a 83 y.o. male,       Assessment:      Sepsis.   Gram-positive rods bacteremia (2/2)-identification still pending as per microbiology    Altered mental status    Chest x-ray-increased bilateral pleural effusions and bibasilar consolidation (01/10/2017)   Sputum culture- Serratia marcescens and Pseudomonas   Pro-calcitonin level-6.7 (01/09/2017).   Pro-calcitonin level-3.9 (01/10/2017)   Pro-calcitonin level-0.2 (01/15/2017)   Urine culture-MDR Proteus mirabilis-Status post 5 days course of treatment (gentamicin and Zosyn).   Foley catheter removed   Blood cultures no growth to date.   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   Status post tracheostomy andG-tube placement   Resident of [name]    Plan:      Continue ceftazidime.   Repeat Pro-calcitonin level pending   Follow-up chest x-ray   Await final identification of gram-positive rods in the blood   Continue probiotics.   Monitor electrolytes and renal functions closely.   Monitor clinically.   Discussed with microbiology lab.   Discussed with pharmacy    ROS:     General: No fever, no chills, no rigor,sleepy but arousable  HEENT: no neck pain, no throat pain, hard of hearing, tracheostomy in place  Endocrine:  no fatigue, no night sweats  Respiratory: no cough, shortness of breath, or wheezing   Cardiovascular: no chest pain   Gastrointestinal: no abdominal pain,no N/V/D  Genito-Urinary: no dysuria or hematuria   Musculoskeletal: no edema  Neurological: c/o generalized weakness   Dermatological: no rash, no ulcer    Physical Examination:     Blood pressure 116/59, pulse 86, temperature 97.8 F (36.6 C), temperature source Oral, resp. rate 18, height 1.854 m (6' 0.99"), weight 98.7 kg (217 lb 9.5 oz), SpO2 100 %.     General Appearance: Comfortable, and in no acute  distress.   HEENT: Pupils are equal, round, and reactive to light.    Lungs: Decreased breath sounds   Heart: regular rate and rhythm   Chest: Symmetric chest wall expansion.    Abdomen: soft ,non tender,no hepatosplenomegaly,good bowel sounds   Neurological: Paraplegia   Extremities: No edema    Laboratory And Diagnostic Studies:     Recent Labs      01/15/17   0131   WBC  14.29*   Hgb  10.1*   Hematocrit  31.6*   Platelets  60*     No results for input(s): NA, K, CL, CO2, BUN, CREAT, GLU, CA in the last 72 hours.  No results for input(s): AST, ALT, ALKPHOS, PROT, ALB in the last 72 hours.    Current Meds:      Scheduled Meds: PRN Meds:        albuterol-ipratropium 3 mL Nebulization Q6H   amiodarone 200 mg Oral Daily   amLODIPine 5 mg Oral Daily   atorvastatin 20 mg per G tube QHS   balsam peru-castor oil (VENELEX)  Topical Q12H   bumetanide 0.5 mg per G tube Daily   cefTAZidime 2 g Intravenous Q8H SCH   famotidine 20 mg Oral QHS   glycopyrrolate 1 mg per G tube Daily   lactobacillus/streptococcus 1 capsule per G tube Daily   metoclopramide 5 mg per G tube Q8H   metoprolol tartrate 25 mg per G tube BID   QUEtiapine 25 mg Oral QHS   rivaroxaban 20 mg per  G tube Daily with dinner   scopolamine 1 patch Transdermal Q72H       Continuous Infusions:     acetaminophen 650 mg Q4H PRN   Or     acetaminophen 650 mg Q4H PRN   naloxone 0.2 mg PRN   ondansetron 4 mg Q6H PRN   Or     ondansetron 4 mg Q6H PRN   oxyCODONE-acetaminophen 1 tablet Q6H PRN   senna 17.2 mg QD PRN         Ryliee Figge A. Janalyn Rouse, M.D.  01/17/2017  9:14 AM

## 2017-01-17 NOTE — Progress Notes (Signed)
INTERNAL MEDICINE PROGRESS NOTE    Progress Note - Carr Shartzer    Date Time: 01/17/17 9:42 AM  Patient Name: 83 y.o. male with Altered mental status, unspecified altered mental status type      Assessment :    Gram Positive Rod Bacteremia/Identification is pending   Pneumonia with Bibasilar consolidation ( sputum culture with Serrata & Pseudomonas)/on ceftazidime   Leukocytosis   Malfunction G-tube   Metabolic Encephalopathy   Acute on chronic respiratory failure - trached and ventilated   Cardiomyopathy with last ejection fraction at 40 percent   Hypertension   Chronic Atrial Fibrillation      Plan:    Continue Ceftazidime for sputum orgnaisms per ID recommendations, on probiotics as well   Finished course of zosyn for urinary tract infection   Follow blood cultures and and tailor antibiotics accordingly   Leukocytosis noted/ No fever   Pro calcitonin pending   Check BNP in the morning   Monitor electrolytes and renal function   Supportive therapy with vent support and enteral feeding.    GI prophylaxis   DVT prophylaxis.   Discussed plan of care with nurses.   Discussed case with consultants.    Subjective:   Incomplete database      Review of Systems:    Incomplete database    Physical Exam:     Vitals:    01/17/17 0931   BP:    Pulse:    Resp:    Temp:    SpO2: 100%       Intake/Output Summary (Last 24 hours) at 01/17/17 1610  Last data filed at 01/16/17 2204   Gross per 24 hour   Intake              560 ml   Output                0 ml   Net              560 ml      General: awake and respnsive   Heent: pinkish conjunctiva, anicteric sclera, dry mucus membrane   Neck: tracheostomy in place   Cvs: S1 & S 2 well heard, regular rate and rhythm   Chest: Clear to auscultation   Abdomen: Soft, non-tender, active bowel sounds.   Ext : no cyanosis, no edema   CNS: awake and more alert today    Meds Reviewed: Yes  Medications:     Current Facility-Administered Medications    Medication Dose Route Frequency   . albuterol-ipratropium  3 mL Nebulization Q6H   . amiodarone  200 mg Oral Daily   . amLODIPine  5 mg Oral Daily   . atorvastatin  20 mg per G tube QHS   . balsam peru-castor oil (VENELEX)   Topical Q12H   . bumetanide  0.5 mg per G tube Daily   . cefTAZidime  2 g Intravenous Q8H SCH   . famotidine  20 mg Oral QHS   . glycopyrrolate  1 mg per G tube Daily   . lactobacillus/streptococcus  1 capsule per G tube Daily   . metoclopramide  5 mg per G tube Q8H   . metoprolol tartrate  25 mg per G tube BID   . QUEtiapine  25 mg Oral QHS   . rivaroxaban  20 mg per G tube Daily with dinner   . scopolamine  1 patch Transdermal Q72H       PRN Medication:  acetaminophen **OR**  acetaminophen, naloxone, ondansetron **OR** ondansetron, oxyCODONE-acetaminophen, senna      Labs:     Results     Procedure Component Value Units Date/Time    Glucose Whole Blood - POCT [[id number]] Collected:  01/17/17 0445     Updated:  01/17/17 0623     POCT - Glucose Whole blood 96 mg/dL     CULTURE BLOOD AEROBIC AND ANAEROBIC [[id number]] Collected:  01/13/17 1646    Specimen:  Blood, Venipuncture Updated:  01/16/17 2021    Narrative:       ORDER#: [id number]                                    ORDERED BY: CHUTUAPE, ARTHU  SOURCE: Blood, Venipuncture peripheral               COLLECTED:  01/13/17 16:46  ANTIBIOTICS AT COLL.:                                RECEIVED :  01/13/17 20:15  Culture Blood Aerobic and Anaerobic        PRELIM      01/16/17 20:21  01/14/17   No Growth after 1 day/s of incubation.  01/15/17   No Growth after 2 day/s of incubation.  01/16/17   No Growth after 3 day/s of incubation.      Glucose Whole Blood - POCT [[id number]]  (Abnormal) Collected:  01/16/17 1753     Updated:  01/16/17 1824     POCT - Glucose Whole blood 109 (H) mg/dL     Glucose Whole Blood - POCT [[id number]]  (Abnormal) Collected:  01/16/17 1145     Updated:  01/16/17 1302     POCT - Glucose Whole blood 131 (H) mg/dL              @MICROIP @    IRadiology:   Radiological Procedure reviewed.      Hershal Coria, MD  Spectra link [id number]  [facility] clinic  [phone]  01/17/2017  9:42 AM

## 2017-01-17 NOTE — Plan of Care (Signed)
Problem: Artificial Airway  Goal: Tracheostomy will be maintained  Outcome: Progressing   01/17/17 1425   Goal/Interventions addressed this shift   Tracheostomy will be maintained Suction secretions as needed;Keep head of bed at 30 degrees, unless contraindicated;Encourage/perform oral hygiene as appropriate;Utilize tracheostomy securing device;Apply water-based moisturizer to lips;Support ventilator tubing to avoid pressure from drag of tubing;Tracheostomy care every shift and as needed;Maintain surgical airway kit or tracheostomy tray at bedside;Keep additional tracheostomy tube of the same size and one size smaller at bedside

## 2017-01-17 NOTE — Plan of Care (Signed)
Problem: Non-Violent Restraints Interdisciplinary Plan  Goal: Will be injury free during the use of non-violent restraints  Outcome: Progressing   01/17/17 0350   Goal/Interventions addressed this shift   Will be injury free during the use of non-violent restraints Attempt all alternatives before use of restraints;Initiate least restrictive type of restraint that is effective;Provide and maintain safe environment;Include patient/family/caregiver in decisions related to safety;Ensure safety devices are properly applied and maintained;Document observed patient actions according to protocol;Nurse to accompany patient off unit when on restraints;Remove restraints before the indicated maximum length of time when meets criteria for discontinuation;Document significant changes in patient condition;Provide debriefing as soon as possible and appropriate;Reassess need for continued restraints;Ensure that order for restraints has not expired   Pt very agitated, pulling at trach and ripped out Iv. Not reorientable. Restraints in place, ROM performed.

## 2017-01-17 NOTE — Plan of Care (Signed)
Problem: Compromised Tissue integrity  Goal: Damaged tissue is healing and protected  Outcome: Not Progressing   01/17/17 0302   Goal/Interventions addressed this shift   Damaged tissue is healing and protected  Monitor/assess Braden scale every shift;Provide wound care per wound care algorithm;Reposition patient every 2 hours and as needed unless able to reposition self;Increase activity as tolerated/progressive mobility;Relieve pressure to bony prominences for patients at moderate and high risk;Keep intact skin clean and dry;Use bath wipes, not soap and water, for daily bathing;Use incontinence wipes for cleaning urine, stool and caustic drainage. Foley care as needed;Monitor external devices/tubes for correct placement to prevent pressure, friction and shearing   Pressure ulcer with serosanguineous drainage. Pt incontinent of stool and urine, repositioned q2H, venelex applied with new mepilex.

## 2017-01-18 ENCOUNTER — Encounter (INDEPENDENT_AMBULATORY_CARE_PROVIDER_SITE_OTHER): Payer: Self-pay

## 2017-01-18 LAB — CBC AND DIFFERENTIAL
Absolute NRBC: 0 10*3/uL
Basophils Absolute Automated: 0.02 10*3/uL (ref 0.00–0.20)
Basophils Automated: 0.1 %
Eosinophils Absolute Automated: 0.2 10*3/uL (ref 0.00–0.70)
Eosinophils Automated: 1.1 %
Hematocrit: 28.4 % — ABNORMAL LOW (ref 42.0–52.0)
Hgb: 8.8 g/dL — ABNORMAL LOW (ref 13.0–17.0)
Immature Granulocytes Absolute: 0.18 10*3/uL — ABNORMAL HIGH
Immature Granulocytes: 1 %
Lymphocytes Absolute Automated: 0.77 10*3/uL (ref 0.50–4.40)
Lymphocytes Automated: 4.2 %
MCH: 26.2 pg — ABNORMAL LOW (ref 28.0–32.0)
MCHC: 31 g/dL — ABNORMAL LOW (ref 32.0–36.0)
MCV: 84.5 fL (ref 80.0–100.0)
MPV: 13 fL — ABNORMAL HIGH (ref 9.4–12.3)
Monocytes Absolute Automated: 1.4 10*3/uL — ABNORMAL HIGH (ref 0.00–1.20)
Monocytes: 7.7 %
Neutrophils Absolute: 15.7 10*3/uL — ABNORMAL HIGH (ref 1.80–8.10)
Neutrophils: 85.9 %
Nucleated RBC: 0 /100 WBC (ref 0.0–1.0)
Platelets: 211 10*3/uL (ref 140–400)
RBC: 3.36 10*6/uL — ABNORMAL LOW (ref 4.70–6.00)
RDW: 17 % — ABNORMAL HIGH (ref 12–15)
WBC: 18.27 10*3/uL — ABNORMAL HIGH (ref 3.50–10.80)

## 2017-01-18 LAB — HEMOLYSIS INDEX: Hemolysis Index: 15 (ref 0–18)

## 2017-01-18 LAB — GLUCOSE WHOLE BLOOD - POCT
Whole Blood Glucose POCT: 122 mg/dL — ABNORMAL HIGH (ref 70–100)
Whole Blood Glucose POCT: 114 mg/dL — ABNORMAL HIGH (ref 70–100)

## 2017-01-18 LAB — BASIC METABOLIC PANEL
Anion Gap: 9 (ref 5.0–15.0)
BUN: 26 mg/dL (ref 9.0–28.0)
CO2: 29 mEq/L (ref 22–29)
Calcium: 8.1 mg/dL (ref 7.9–10.2)
Chloride: 103 mEq/L (ref 100–111)
Creatinine: 0.7 mg/dL (ref 0.7–1.3)
Glucose: 111 mg/dL — ABNORMAL HIGH (ref 70–100)
Potassium: 3.7 mEq/L (ref 3.5–5.1)
Sodium: 141 mEq/L (ref 136–145)

## 2017-01-18 LAB — PROCALCITONIN: Procalcitonin: 0.4 — ABNORMAL HIGH (ref 0.0–0.1)

## 2017-01-18 LAB — B-TYPE NATRIURETIC PEPTIDE: B-Natriuretic Peptide: 173 pg/mL — ABNORMAL HIGH (ref 0–100)

## 2017-01-18 LAB — GFR: EGFR: >60

## 2017-01-18 MED ORDER — SODIUM CHLORIDE 0.9 % IV BOLUS
200.0000 mL | Freq: Two times a day (BID) | INTRAVENOUS | Status: DC | PRN
Start: 2017-01-18 — End: 2017-01-30

## 2017-01-18 MED ORDER — BUMETANIDE 1 MG PO TABS
1.0000 mg | ORAL_TABLET | Freq: Every day | ORAL | Status: DC
Start: 2017-01-18 — End: 2017-01-22
  Administered 2017-01-18 – 2017-01-22 (×5): 1 mg via GASTROSTOMY
  Filled 2017-01-18 (×6): qty 1

## 2017-01-18 MED ORDER — METOPROLOL TARTRATE 50 MG PO TABS
50.0000 mg | ORAL_TABLET | Freq: Two times a day (BID) | ORAL | Status: DC
Start: 2017-01-18 — End: 2017-01-30
  Administered 2017-01-18 – 2017-01-30 (×20): 50 mg via GASTROSTOMY
  Filled 2017-01-18 (×22): qty 1

## 2017-01-18 NOTE — Plan of Care (Addendum)
Problem: Pain  Goal: Pain at adequate level as identified by patient   01/18/17 1619   Goal/Interventions addressed this shift   Pain at adequate level as identified by patient Assess pain on admission, during daily assessment and/or before any "as needed" intervention(s);Reassess pain within 30-60 minutes of any procedure/intervention, per Pain Assessment, Intervention, Reassessment (AIR) Cycle;Offer non-pharmacological pain management interventions       Problem: Inadequate Gas Exchange  Goal: Adequate oxygenation and improved ventilation  Outcome: Progressing  Frequently suctioned inline. Rhonchi bilaterally. O2 sat remained stable. Trach tube and vent secured. Pt has not attempted to pull on trachea.Trach tube changed, new dressing applied. Mouth care provided Q4hrs.    01/18/17 1619   Goal/Interventions addressed this shift   Adequate oxygenation and improved ventilation Assess lung sounds;Monitor SpO2 and treat as needed;Provide mechanical and oxygen support to facilitate gas exchange;Position for maximum ventilatory efficiency;Plan activities to conserve energy: plan rest periods;Increase activity as tolerated/progressive mobility       Problem: Compromised Tissue integrity  Goal: Damaged tissue is healing and protected  Outcome: Progressing  Small pink opening on the sacrum. Surrounding skin is pink. Cleansed, venelex applied, new dressing applied. Pt is repositioned Q2 hrs using wedges. Prevolon boots on bilaterally to protect heels. SCDs on and pumping. Noted that patient has 2 blister on penus head. Notified Dr. Janalyn Rouse who recommended not to place condom cath. Pt is incontinent of urine. Pt kept clean and dry. Provided patient with Passive range of motion exercise on BLE.      01/18/17 1619   Goal/Interventions addressed this shift   Damaged tissue is healing and protected  Monitor/assess Braden scale every shift;Provide wound care per wound care algorithm;Reposition patient every 2 hours and as needed  unless able to reposition self;Increase activity as tolerated/progressive mobility;Relieve pressure to bony prominences for patients at moderate and high risk;Keep intact skin clean and dry;Use incontinence wipes for cleaning urine, stool and caustic drainage. Foley care as needed;Encourage use of lotion/moisturizer on skin       Problem: Inadequate Cardiac Output  Goal: Adequate tissue perfusion will be maintained  Outcome: Progressing  Pt's heart rate increased to 110-130's, also happened last night. Notified Dr. [name] who increased metoprolol to 50 mg bid. Given 50 mg metoprolol through peg tube. MD also order as needed 200 ml bolus in case SBP goes below 100. Will continue to monitor.      01/18/17 1619   Goal/Interventions addressed this shift   Adequate tissue perfusion will be maintained Monitor/assess vital signs;Encourage/assist patient as needed to turn, cough, and perform deep breathing every 2 hours;VTE Prevention: Administer anticoagulant(s) and/or apply anti-embolism stockings/devices as ordered

## 2017-01-18 NOTE — Progress Notes (Signed)
Pt's daughter Jules Schick [phone] would like an update when patient will be discharged to [facility]

## 2017-01-18 NOTE — Progress Notes (Signed)
INTERNAL MEDICINE PROGRESS NOTE    Progress Note - [name]    Date Time: 01/18/17 4:08 PM  Patient Name: 83 y.o. male with Altered mental status, unspecified altered mental status type      Assessment :    Gram Positive Rod Bacteremia/Identification is pending   Pneumonia with Bibasilar consolidation ( sputum culture with Serrata & Pseudomonas)/on ceftazidime   Leukocytosis   Malfunction G-tube   Metabolic Encephalopathy   Acute on chronic respiratory failure - trached and ventilated   Cardiomyopathy with last ejection fraction at 40 percent   Hypertension   Chronic Atrial Fibrillation      Plan:    Continue Ceftazidime for sputum orgnaisms per ID recommendations, on probiotics as well   Increase Bumex to 1 gm a day for high BNP   Increase metoprolo due to RVR   NS bolus PRN for low blood pressure   Finished course of zosyn for urinary tract infection   Follow blood cultures and and tailor antibiotics accordingly   Leukocytosis noted/ No fever   Pro calcitonin Level noted   Check BNP in the morning   Monitor electrolytes and renal function   Supportive therapy with vent support and enteral feeding.    GI prophylaxis   DVT prophylaxis.   Discussed plan of care with nurses.   Discussed case with consultants.    Subjective:   ICDB      Review of Systems:    ICDB    Physical Exam:     Vitals:    01/18/17 1552   BP:    Pulse: (!) 116   Resp: 22   Temp:    SpO2: 100%       Intake/Output Summary (Last 24 hours) at 01/18/17 1608  Last data filed at 01/18/17 1429   Gross per 24 hour   Intake              410 ml   Output              750 ml   Net             -340 ml      General:awake and respnsive   Heent:pinkish conjunctiva, anicteric sclera, dry mucus membrane   Neck: tracheostomy in place   Cvs:S1 &S 2 well heard, regular rate and rhythm   Chest:Clear to auscultation   Abdomen:Soft, non-tender, active bowel sounds.   Ext : no cyanosis, no edema   CNS: awake and more  alert today  Meds Reviewed: Yes  Medications:     Current Facility-Administered Medications   Medication Dose Route Frequency   . albuterol-ipratropium  3 mL Nebulization Q6H   . amiodarone  200 mg Oral Daily   . amLODIPine  5 mg Oral Daily   . atorvastatin  20 mg per G tube QHS   . balsam peru-castor oil (VENELEX)   Topical Q12H   . bumetanide  1 mg per G tube Daily   . cefTAZidime  2 g Intravenous Q8H SCH   . famotidine  20 mg Oral QHS   . glycopyrrolate  1 mg per G tube Daily   . lactobacillus/streptococcus  1 capsule per G tube Daily   . metoclopramide  5 mg per G tube Q8H   . metoprolol tartrate  50 mg per G tube BID   . QUEtiapine  25 mg Oral QHS   . rivaroxaban  20 mg per G tube Daily with dinner   .  scopolamine  1 patch Transdermal Q72H       PRN Medication:  acetaminophen **OR** acetaminophen, naloxone, ondansetron **OR** ondansetron, oxyCODONE-acetaminophen, senna, sodium chloride      Labs:     Results     Procedure Component Value Units Date/Time    Glucose Whole Blood - POCT [[id number]]  (Abnormal) Collected:  01/18/17 1153     Updated:  01/18/17 1226     POCT - Glucose Whole blood 122 (H) mg/dL     Procalcitonin [[id number]]  (Abnormal) Collected:  01/17/17 1546     Updated:  01/18/17 1151     Procalcitonin 0.4 (H)    B-type Natriuretic Peptide [[id number]]  (Abnormal) Collected:  01/18/17 0400    Specimen:  Blood Updated:  01/18/17 0536     B-Natriuretic Peptide 173 (H) pg/mL     CULTURE BLOOD AEROBIC AND ANAEROBIC [[id number]] Collected:  01/13/17 1646    Specimen:  Blood, Venipuncture Updated:  01/17/17 2021    Narrative:       ORDER#: [id number]                                    ORDERED BY: CHUTUAPE, ARTHU  SOURCE: Blood, Venipuncture peripheral               COLLECTED:  01/13/17 16:46  ANTIBIOTICS AT COLL.:                                RECEIVED :  01/13/17 20:15  Culture Blood Aerobic and Anaerobic        PRELIM      01/17/17 20:21  01/14/17   No Growth after 1 day/s of incubation.  01/15/17   No  Growth after 2 day/s of incubation.  01/16/17   No Growth after 3 day/s of incubation.  01/17/17   No Growth after 4 day/s of incubation.      Basic Metabolic Panel [[id number]]  (Abnormal) Collected:  01/17/17 1546    Specimen:  Blood Updated:  01/17/17 1743     Glucose 117 (H) mg/dL      BUN 64.3 mg/dL      Creatinine 0.7 mg/dL      Calcium 8.3 mg/dL      Sodium 329 mEq/L      Potassium 3.9 mEq/L      Chloride 104 mEq/L      CO2 30 (H) mEq/L      Anion Gap 7.0    Hemolysis index [[id number]] Collected:  01/17/17 1546     Updated:  01/17/17 1743     Hemolysis Index 7    GFR [[id number]] Collected:  01/17/17 1546     Updated:  01/17/17 1743     EGFR >60.0    CBC and differential [[id number]]  (Abnormal) Collected:  01/17/17 1546    Specimen:  Blood from Blood Updated:  01/17/17 1703     WBC 15.52 (H) x10 3/uL      Hgb 9.2 (L) g/dL      Hematocrit 02.5 (L) %      Platelets 151 x10 3/uL      RBC 3.49 (L) x10 6/uL      MCV 85.4 fL      MCH 26.4 (L) pg      MCHC 30.9 (L) g/dL      RDW 17 (H) %  MPV 13.2 (H) fL      Neutrophils 84.3 %      Lymphocytes Automated 6.3 %      Monocytes 7.3 %      Eosinophils Automated 1.0 %      Basophils Automated 0.1 %      Immature Granulocyte 1.0 %      Nucleated RBC 0.0 /100 WBC      Neutrophils Absolute 13.10 (H) x10 3/uL      Abs Lymph Automated 0.97 x10 3/uL      Abs Mono Automated 1.13 x10 3/uL      Abs Eos Automated 0.15 x10 3/uL      Absolute Baso Automated 0.02 x10 3/uL      Absolute Immature Granulocyte 0.15 (H) x10 3/uL      Absolute NRBC 0.00 x10 3/uL             @MICROIP @    IRadiology:   Radiological Procedure reviewed.      Hershal Coria, MD  Spectra link [id number]  [facility] clinic  [phone]  01/18/2017  4:08 PM

## 2017-01-18 NOTE — Progress Notes (Signed)
CM attempted to meet w/ pt at bedside, pt drowsy and not able to carry on conversation. Spoke w/ bedside nurse, sputum cultures pending, pt w/ leukocytosis. CM has attempted to reach dtr but has been unsuccessful. Will attempt again    DCP to return to [facility] resp care unit    Transport via ambulance    Madie Reno, RN  Clinical Case Manager I  [phone]

## 2017-01-18 NOTE — Progress Notes (Signed)
Infectious Diseases & Tropical Medicine  Progress Note    01/18/2017   [name] ZOX:[id number] is a 83 y.o. male,       Assessment:      Sepsis.   Gram-positive rods bacteremia (2/2)-identification still pending as per microbiology    Altered mental status    Chest x-ray-increased bilateral pleural effusions and bibasilar consolidation (01/10/2017)   Sputum culture- Serratia marcescens and Pseudomonas   Pro-calcitonin level-6.7 (01/09/2017).   Pro-calcitonin level-3.9 (01/10/2017)   Pro-calcitonin level-0.2 (01/15/2017)   Pro-calcitonin level-0.4 (01/17/2017)   Urine culture-MDR Proteus mirabilis-Status post 5 days course of treatment (gentamicin and Zosyn).   External catheter in place   Blood cultures no growth to date.   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   Status post tracheostomy andG-tube placement.   Blister over the glans penis-likely ALLERGIC reaction to external catheter   Resident of [name]    Plan:      Continue ceftazidime.   Follow-up chest x-ray in a.m.    If any worsening in chest x-ray or leukocytosis will expand antibiotic coverage   Await final identification of gram-positive rods in the blood.   Discontinue external catheter   Continue probiotics.   Monitor electrolytes and renal functions closely.   Monitor clinically.   Discussed with microbiology lab.   Discussed with pharmacy    ROS:     General: No fever, no chills, no rigor,sleepy but arousable  HEENT: no neck pain, no throat pain, hard of hearing, tracheostomy in place  Endocrine:  no fatigue, no night sweats  Respiratory: no cough, shortness of breath, or wheezing   Cardiovascular: no chest pain   Gastrointestinal: no abdominal pain,no N/V/D  Genito-Urinary: no dysuria or hematuria,external catheter in place   Musculoskeletal: no edema  Neurological: c/o generalized weakness   Dermatological: no rash, no ulcer    Physical Examination:     Blood  pressure 120/62, pulse (!) 115, temperature 98.7 F (37.1 C), temperature source Axillary, resp. rate 19, height 1.854 m (6' 0.99"), weight 98.7 kg (217 lb 9.5 oz), SpO2 98 %.     General Appearance: Comfortable, and in no acute distress.   HEENT: Pupils are equal, round, and reactive to light.    Lungs: Decreased breath sounds   Heart: regular rate and rhythm   Chest: Symmetric chest wall expansion.    Abdomen: soft ,non tender,no hepatosplenomegaly,good bowel sounds   Neurological: Paraplegia   Extremities: No edema,water filled blisters over the glans penis    Laboratory And Diagnostic Studies:     Recent Labs      01/17/17   1546   WBC  15.52*   Hgb  9.2*   Hematocrit  29.8*   Platelets  151     Recent Labs      01/17/17   1546   Sodium  141   Potassium  3.9   Chloride  104   CO2  30*   BUN  26.0   Creatinine  0.7   Glucose  117*   Calcium  8.3     No results for input(s): AST, ALT, ALKPHOS, PROT, ALB in the last 72 hours.    Current Meds:      Scheduled Meds: PRN Meds:        albuterol-ipratropium 3 mL Nebulization Q6H   amiodarone 200 mg Oral Daily   amLODIPine 5 mg Oral Daily   atorvastatin 20 mg per G tube QHS  balsam peru-castor oil (VENELEX)  Topical Q12H   bumetanide 1 mg per G tube Daily   cefTAZidime 2 g Intravenous Q8H SCH   famotidine 20 mg Oral QHS   glycopyrrolate 1 mg per G tube Daily   lactobacillus/streptococcus 1 capsule per G tube Daily   metoclopramide 5 mg per G tube Q8H   metoprolol tartrate 25 mg per G tube BID   QUEtiapine 25 mg Oral QHS   rivaroxaban 20 mg per G tube Daily with dinner   scopolamine 1 patch Transdermal Q72H       Continuous Infusions:     acetaminophen 650 mg Q4H PRN   Or     acetaminophen 650 mg Q4H PRN   naloxone 0.2 mg PRN   ondansetron 4 mg Q6H PRN   Or     ondansetron 4 mg Q6H PRN   oxyCODONE-acetaminophen 1 tablet Q6H PRN   senna 17.2 mg QD PRN         [name], M.D.  01/18/2017  9:16 AM

## 2017-01-18 NOTE — Plan of Care (Addendum)
Problem: Non-Violent Restraints Interdisciplinary Plan  Goal: Will be injury free during the use of non-violent restraints  Outcome: Completed Date Met: 01/18/17  Pt is drowsy but easily arousable. Not pulling on trach tube. Discontinued restraints at 0800. No injury noted from restraints. Notified Pt's daughter Erie Noe that restraints have been discontinued. Trach/vent is secured. Will continue to monitor.    01/18/17 1610   Goal/Interventions addressed this shift   Will be injury free during the use of non-violent restraints Attempt all alternatives before use of restraints;Initiate least restrictive type of restraint that is effective;Provide and maintain safe environment;Include patient/family/caregiver in decisions related to safety;Ensure safety devices are properly applied and maintained;Document observed patient actions according to protocol;Remove restraints before the indicated maximum length of time when meets criteria for discontinuation;Provide debriefing as soon as possible and appropriate;Reassess need for continued restraints;Ensure that order for restraints has not expired

## 2017-01-18 NOTE — Progress Notes (Signed)
TCM Medicare Navigator    Patient currently admitted to [facility] under inpatient status. Medicare Focus Navigator will continue to monitor patient status for 30 days following index admission which occurred dates 12/14/2016 to 12/27/2016.      Mairi Stagliano BS   TCM Medicare Focus navigator   [phone]

## 2017-01-19 ENCOUNTER — Inpatient Hospital Stay: Payer: Medicare Other

## 2017-01-19 LAB — CBC AND DIFFERENTIAL
Absolute NRBC: 0 10*3/uL
Basophils Absolute Automated: 0.02 10*3/uL (ref 0.00–0.20)
Basophils Automated: 0.2 %
Eosinophils Absolute Automated: 0.34 10*3/uL (ref 0.00–0.70)
Eosinophils Automated: 2.6 %
Hematocrit: 27.7 % — ABNORMAL LOW (ref 42.0–52.0)
Hgb: 8.5 g/dL — ABNORMAL LOW (ref 13.0–17.0)
Immature Granulocytes Absolute: 0.12 10*3/uL — ABNORMAL HIGH
Immature Granulocytes: 0.9 %
Lymphocytes Absolute Automated: 1.14 10*3/uL (ref 0.50–4.40)
Lymphocytes Automated: 8.7 %
MCH: 26.4 pg — ABNORMAL LOW (ref 28.0–32.0)
MCHC: 30.7 g/dL — ABNORMAL LOW (ref 32.0–36.0)
MCV: 86 fL (ref 80.0–100.0)
MPV: 12.4 fL — ABNORMAL HIGH (ref 9.4–12.3)
Monocytes Absolute Automated: 0.94 10*3/uL (ref 0.00–1.20)
Monocytes: 7.2 %
Neutrophils Absolute: 10.58 10*3/uL — ABNORMAL HIGH (ref 1.80–8.10)
Neutrophils: 80.4 %
Nucleated RBC: 0 /100 WBC (ref 0.0–1.0)
Platelets: 256 10*3/uL (ref 140–400)
RBC: 3.22 10*6/uL — ABNORMAL LOW (ref 4.70–6.00)
RDW: 17 % — ABNORMAL HIGH (ref 12–15)
WBC: 13.14 10*3/uL — ABNORMAL HIGH (ref 3.50–10.80)

## 2017-01-19 LAB — GLUCOSE WHOLE BLOOD - POCT: Whole Blood Glucose POCT: 114 mg/dL — ABNORMAL HIGH (ref 70–100)

## 2017-01-19 MED ORDER — PIPERACILLIN-TAZOBACTAM 4.5 GM IN NS 100 ML IVPB (CNR)
4.5000 g | Freq: Three times a day (TID) | INTRAVENOUS | Status: DC
Start: 2017-01-19 — End: 2017-01-23
  Administered 2017-01-19 – 2017-01-23 (×12): 4.5 g via INTRAVENOUS
  Filled 2017-01-19 (×12): qty 100

## 2017-01-19 NOTE — Progress Notes (Signed)
Pt switched to the sizewise immerse 35" bed.

## 2017-01-19 NOTE — Plan of Care (Signed)
Problem: Inadequate Gas Exchange  Goal: Adequate oxygenation and improved ventilation  Outcome: Progressing   01/19/17 0848   Goal/Interventions addressed this shift   Adequate oxygenation and improved ventilation Assess lung sounds;Monitor SpO2 and treat as needed;Provide mechanical and oxygen support to facilitate gas exchange;Position for maximum ventilatory efficiency;Increase activity as tolerated/progressive mobility;Plan activities to conserve energy: plan rest periods;Consult/collaborate with Respiratory Therapy       Problem: Artificial Airway  Goal: Tracheostomy will be maintained  Outcome: Progressing   01/19/17 0848   Goal/Interventions addressed this shift   Tracheostomy will be maintained Suction secretions as needed;Keep head of bed at 30 degrees, unless contraindicated;Encourage/perform oral hygiene as appropriate;Apply water-based moisturizer to lips;Utilize tracheostomy securing device;Support ventilator tubing to avoid pressure from drag of tubing;Tracheostomy care every shift and as needed;Maintain surgical airway kit or tracheostomy tray at bedside;Keep additional tracheostomy tube of the same size and one size smaller at bedside       Problem: Inadequate Cardiac Output  Goal: Adequate tissue perfusion will be maintained  Outcome: Progressing   01/19/17 0848   Goal/Interventions addressed this shift   Adequate tissue perfusion will be maintained Monitor/assess vital signs;Monitor/assess lab values and report abnormal values;Monitor/assess neurovascular status (pulses, capillary refill, pain, paresthesia, paralysis, presence of edema);Monitor/assess for signs of VTE (edema of calf/thigh redness, pain);Monitor intake and output;Monitor for signs and symptoms of a pulmonary embolism (dyspnea, tachypnea, tachycardia, confusion);Encourage/assist patient as needed to turn, cough, and perform deep breathing every 2 hours;Perform active/passive ROM;Position patient for maximum circulation/cardiac  output;Assess and monitor skin integrity;Provide wound/skin care        Pt vent  Dependent,  Controlled afib on the monitor, suction as needed all night, trac care done, wound care done, incontinent care done,  No respiratory distress noted, peg tube intact, tube feed infusing as ordered, pain management  Safety maintained, hourly rounds,

## 2017-01-19 NOTE — Plan of Care (Signed)
Problem: Safety  Goal: Patient will be free from infection during hospitalization  Outcome: Progressing      Problem: Pain  Goal: Pain at adequate level as identified by patient  Outcome: Progressing      Problem: Side Effects from Pain Analgesia  Goal: Patient will experience minimal side effects of analgesic therapy  Outcome: Progressing      Problem: Psychosocial and Spiritual Needs  Goal: Demonstrates ability to cope with hospitalization/illness  Outcome: Progressing      Problem: Inadequate Gas Exchange  Goal: Provide mechanical and oxygen support to facilitate gas exchange  Outcome: Progressing      Problem: Compromised Tissue integrity  Goal: Damaged tissue is healing and protected  Outcome: Progressing    Goal: Nutritional status is improving  Outcome: Progressing      Comments: Pt mostly drowsy but easily wakes to touch. Moves upper extremities but does not follow commands. Afib on the monitor, stable BP. Trach C/D/I, vent maintained. Inline secretion is thick, large and yellowish. Tube feeds maintained. Large LBMx3. Incontinent to urine. Urine culture sent per order.

## 2017-01-19 NOTE — Progress Notes (Signed)
[facility]  Progress Note    01/19/2017   Bowdy Bair ZOX:[id number] is a 83 y.o. male,       Assessment:      Sepsis.   Gram-positive rods bacteremia (2/2)-identification still pending as per microbiology    Altered mental status    Chest x-ray-Retrocardiac consolidation/atelectasis (01/19/2017)   Sputum culture- Serratia marcescens and Pseudomonas   Pro-calcitonin level-6.7 (01/09/2017).   Pro-calcitonin level-3.9 (01/10/2017)    Pro-calcitonin level-0.2 (01/15/2017)   Pro-calcitonin level-0.4 (01/17/2017)   Urine culture-MDR Proteus mirabilis-Status post 5 days course of treatment (gentamicin and Zosyn).   External catheter in place   Blood cultures no growth to date.   Leukocytosis worsening    History of quadriparesis secondary to neck fracture.   Status post tracheostomy andG-tube placement.   Blister over the glans penis stable   Resident of [facility]    Plan:      Restart Zosyn.   Repeat urine culture   Continue probiotics.   Monitor electrolytes and renal functions closely.   Monitor clinically     ROS:     General: No fever, no chills, no rigor,minimally responsive  HEENT: no neck pain, no throat pain, hard of hearing, tracheostomy in place  Endocrine:  no fatigue, no night sweats  Respiratory: no cough, shortness of breath, or wheezing   Cardiovascular: no chest pain   Gastrointestinal: no abdominal pain,no N/V/D  Genito-Urinary: no dysuria or hematuria  Musculoskeletal: no edema  Neurological: c/o generalized weakness   Dermatological: no rash, no ulcer    Physical Examination:     Blood pressure 91/57, pulse 92, temperature 98.2 F (36.8 C), temperature source Axillary, resp. rate 18, height 1.854 m (6' 0.99"), weight 103.9 kg (229 lb), SpO2 100 %.     General Appearance: Comfortable, and in no acute distress.   HEENT: Pupils are equal, round, and reactive to light.    Lungs: Clear to auscultation   Heart: regular rate and  rhythm   Chest: Symmetric chest wall expansion.    Abdomen: soft ,non tender,no hepatosplenomegaly,good bowel sounds   Neurological: Paraplegia   Extremities: No edema,water filled blisters over the glans penis    Laboratory And Diagnostic Studies:     Recent Labs      01/18/17   1726  01/17/17   1546   WBC  18.27*  15.52*   Hgb  8.8*  9.2*   Hematocrit  28.4*  29.8*   Platelets  211  151     Recent Labs      01/18/17   1726  01/17/17   1546   Sodium  141  141   Potassium  3.7  3.9   Chloride  103  104   CO2  29  30*   BUN  26.0  26.0   Creatinine  0.7  0.7   Glucose  111*  117*   Calcium  8.1  8.3     No results for input(s): AST, ALT, ALKPHOS, PROT, ALB in the last 72 hours.    Current Meds:      Scheduled Meds: PRN Meds:        albuterol-ipratropium 3 mL Nebulization Q6H   amiodarone 200 mg Oral Daily   amLODIPine 5 mg Oral Daily   atorvastatin 20 mg per G tube QHS   balsam peru-castor oil (VENELEX)  Topical Q12H   bumetanide 1 mg per G tube Daily   cefTAZidime 2 g Intravenous [facility]  famotidine 20 mg Oral QHS   glycopyrrolate 1 mg per G tube Daily   lactobacillus/streptococcus 1 capsule per G tube Daily   metoclopramide 5 mg per G tube Q8H   metoprolol tartrate 50 mg per G tube BID   QUEtiapine 25 mg Oral QHS   rivaroxaban 20 mg per G tube Daily with dinner   scopolamine 1 patch Transdermal Q72H       Continuous Infusions:     acetaminophen 650 mg Q4H PRN   Or     acetaminophen 650 mg Q4H PRN   naloxone 0.2 mg PRN   ondansetron 4 mg Q6H PRN   Or     ondansetron 4 mg Q6H PRN   oxyCODONE-acetaminophen 1 tablet Q6H PRN   senna 17.2 mg QD PRN   sodium chloride 200 mL Q12H PRN         Jackey Housey A. Janalyn Rouse, M.D.  01/19/2017  9:31 AM

## 2017-01-19 NOTE — Progress Notes (Signed)
INTERNAL MEDICINE PROGRESS NOTE    Progress Note - [name]    Date Time: 01/19/17 2:59 PM  Patient Name: 83 y.o. male with Altered mental status, unspecified altered mental status type      Assessment :    Gram Positive Rod Bacteremia/Identification is pending   Pneumoniawith Bibasilar consolidation( sputum culture with Serrata &Pseudomonas)/on ceftazidime   Leukocytosis   Malfunction G-tube   Metabolic Encephalopathy   Acute on chronic respiratory failure - trached and ventilated   Cardiomyopathy with last ejection fraction at 40 percent   Hypertension   Chronic Atrial Fibrillation      Plan:    Continue Ceftazidime for sputum orgnaisms per ID recommendations, on probiotics as well   D/C Amlodipine for low  Blood pressure   CXR pending from today   Increase Bumex to 1 gm a day for high BNP   Increase metoprolo due to RVR   NS bolus PRN for low blood pressure   Finished course of zosyn for urinary tract infection   Follow blood cultures and and tailor antibiotics accordingly   Leukocytosis noted/ No fever   Pro calcitonin Level noted   Check BNP in the morning   Monitor electrolytes and renal function   Supportive therapy with vent support and enteral feeding.    GI prophylaxis   DVT prophylaxis.   Discussed plan of care with nurses.   Discussed case with consultants.    Subjective:   Incomplete database      Review of Systems:    Incomplete database    Physical Exam:     Vitals:    01/19/17 1405   BP: 98/69   Pulse: (!) 111   Resp: (!) 37   Temp:    SpO2: 100%       Intake/Output Summary (Last 24 hours) at 01/19/17 1459  Last data filed at 01/19/17 1400   Gross per 24 hour   Intake             1597 ml   Output                0 ml   Net             1597 ml      General:awake and respnsive   Heent:pinkish conjunctiva, anicteric sclera, dry mucus membrane   Neck: tracheostomy in place   Cvs:S1 &S 2 well heard, regular rate and rhythm   Chest:Clear to  auscultation   Abdomen:Soft, non-tender, active bowel sounds.   Ext : no cyanosis, no edema   CNS: awake and more alert today    Meds Reviewed: Yes  Medications:     Current Facility-Administered Medications   Medication Dose Route Frequency   . albuterol-ipratropium  3 mL Nebulization Q6H   . amiodarone  200 mg Oral Daily   . atorvastatin  20 mg per G tube QHS   . balsam peru-castor oil (VENELEX)   Topical Q12H   . bumetanide  1 mg per G tube Daily   . cefTAZidime  2 g Intravenous Q8H SCH   . famotidine  20 mg Oral QHS   . glycopyrrolate  1 mg per G tube Daily   . lactobacillus/streptococcus  1 capsule per G tube Daily   . metoclopramide  5 mg per G tube Q8H   . metoprolol tartrate  50 mg per G tube BID   . QUEtiapine  25 mg Oral QHS   . rivaroxaban  20 mg  per G tube Daily with dinner   . scopolamine  1 patch Transdermal Q72H       PRN Medication:  acetaminophen **OR** acetaminophen, naloxone, ondansetron **OR** ondansetron, oxyCODONE-acetaminophen, senna, sodium chloride      Labs:     Results     Procedure Component Value Units Date/Time    Glucose Whole Blood - POCT [[id number]]  (Abnormal) Collected:  01/19/17 0358     Updated:  01/19/17 0415     POCT - Glucose Whole blood 114 (H) mg/dL     CULTURE BLOOD AEROBIC AND ANAEROBIC [[id number]] Collected:  01/13/17 1646    Specimen:  Blood, Venipuncture Updated:  01/18/17 2221    Narrative:       ORDER#: [id number]                                    ORDERED BY: CHUTUAPE, ARTHU  SOURCE: Blood, Venipuncture peripheral               COLLECTED:  01/13/17 16:46  ANTIBIOTICS AT COLL.:                                RECEIVED :  01/13/17 20:15  Culture Blood Aerobic and Anaerobic        FINAL       01/18/17 22:21  01/18/17   No growth after 5 days of incubation.      Glucose Whole Blood - POCT [[id number]]  (Abnormal) Collected:  01/18/17 2155     Updated:  01/18/17 2158     POCT - Glucose Whole blood 114 (H) mg/dL     Basic Metabolic Panel [[id number]]  (Abnormal) Collected:   01/18/17 1726    Specimen:  Blood Updated:  01/18/17 1800     Glucose 111 (H) mg/dL      BUN 24.4 mg/dL      Creatinine 0.7 mg/dL      Calcium 8.1 mg/dL      Sodium 010 mEq/L      Potassium 3.7 mEq/L      Chloride 103 mEq/L      CO2 29 mEq/L      Anion Gap 9.0    Hemolysis index [[id number]] Collected:  01/18/17 1726     Updated:  01/18/17 1800     Hemolysis Index 15    GFR [[id number]] Collected:  01/18/17 1726     Updated:  01/18/17 1800     EGFR >60.0    CBC and differential [[id number]]  (Abnormal) Collected:  01/18/17 1726    Specimen:  Blood from Blood Updated:  01/18/17 1744     WBC 18.27 (H) x10 3/uL      Hgb 8.8 (L) g/dL      Hematocrit 29.5 (L) %      Platelets 211 x10 3/uL      RBC 3.36 (L) x10 6/uL      MCV 84.5 fL      MCH 26.2 (L) pg      MCHC 31.0 (L) g/dL      RDW 17 (H) %      MPV 13.0 (H) fL      Neutrophils 85.9 %      Lymphocytes Automated 4.2 %      Monocytes 7.7 %      Eosinophils Automated 1.1 %      Basophils Automated  0.1 %      Immature Granulocyte 1.0 %      Nucleated RBC 0.0 /100 WBC      Neutrophils Absolute 15.70 (H) x10 3/uL      Abs Lymph Automated 0.77 x10 3/uL      Abs Mono Automated 1.40 (H) x10 3/uL      Abs Eos Automated 0.20 x10 3/uL      Absolute Baso Automated 0.02 x10 3/uL      Absolute Immature Granulocyte 0.18 (H) x10 3/uL      Absolute NRBC 0.00 x10 3/uL             @MICROIP @    IRadiology:   Radiological Procedure reviewed.      Hershal Coria, MD  Spectra link [id number]  [facility] clinic  [phone]  01/19/2017  2:59 PM

## 2017-01-20 LAB — BASIC METABOLIC PANEL
Anion Gap: 8 (ref 5.0–15.0)
BUN: 29 mg/dL — ABNORMAL HIGH (ref 9.0–28.0)
CO2: 28 mEq/L (ref 22–29)
Calcium: 8.2 mg/dL (ref 7.9–10.2)
Chloride: 102 mEq/L (ref 100–111)
Creatinine: 0.7 mg/dL (ref 0.7–1.3)
Glucose: 89 mg/dL (ref 70–100)
Potassium: 3.6 mEq/L (ref 3.5–5.1)
Sodium: 138 mEq/L (ref 136–145)

## 2017-01-20 LAB — CBC AND DIFFERENTIAL
Absolute NRBC: 0 10*3/uL
Basophils Absolute Automated: 0.02 10*3/uL (ref 0.00–0.20)
Basophils Automated: 0.2 %
Eosinophils Absolute Automated: 0.14 10*3/uL (ref 0.00–0.70)
Eosinophils Automated: 1.1 %
Hematocrit: 26.8 % — ABNORMAL LOW (ref 42.0–52.0)
Hgb: 8.4 g/dL — ABNORMAL LOW (ref 13.0–17.0)
Immature Granulocytes Absolute: 0.11 10*3/uL — ABNORMAL HIGH
Immature Granulocytes: 0.9 %
Lymphocytes Absolute Automated: 1.07 10*3/uL (ref 0.50–4.40)
Lymphocytes Automated: 8.3 %
MCH: 26.1 pg — ABNORMAL LOW (ref 28.0–32.0)
MCHC: 31.3 g/dL — ABNORMAL LOW (ref 32.0–36.0)
MCV: 83.2 fL (ref 80.0–100.0)
MPV: 13.1 fL — ABNORMAL HIGH (ref 9.4–12.3)
Monocytes Absolute Automated: 1.02 10*3/uL (ref 0.00–1.20)
Monocytes: 8 %
Neutrophils Absolute: 10.46 10*3/uL — ABNORMAL HIGH (ref 1.80–8.10)
Neutrophils: 81.5 %
Nucleated RBC: 0 /100 WBC (ref 0.0–1.0)
Platelets: 93 10*3/uL — ABNORMAL LOW (ref 140–400)
RBC: 3.22 10*6/uL — ABNORMAL LOW (ref 4.70–6.00)
RDW: 17 % — ABNORMAL HIGH (ref 12–15)
WBC: 12.82 10*3/uL — ABNORMAL HIGH (ref 3.50–10.80)

## 2017-01-20 LAB — GLUCOSE WHOLE BLOOD - POCT
Whole Blood Glucose POCT: 104 mg/dL — ABNORMAL HIGH (ref 70–100)
Whole Blood Glucose POCT: 110 mg/dL — ABNORMAL HIGH (ref 70–100)
Whole Blood Glucose POCT: 116 mg/dL — ABNORMAL HIGH (ref 70–100)
Whole Blood Glucose POCT: 127 mg/dL — ABNORMAL HIGH (ref 70–100)

## 2017-01-20 LAB — GFR: EGFR: >60

## 2017-01-20 LAB — HEMOLYSIS INDEX: Hemolysis Index: 18 (ref 0–18)

## 2017-01-20 NOTE — Plan of Care (Signed)
Problem: Pain  Goal: Pain at adequate level as identified by patient  Outcome: Progressing   01/20/17 0557   Goal/Interventions addressed this shift   Pain at adequate level as identified by patient Identify patient comfort function goal;Assess for risk of opioid induced respiratory depression, including snoring/sleep apnea. Alert healthcare team of risk factors identified.;Reassess pain within 30-60 minutes of any procedure/intervention, per Pain Assessment, Intervention, Reassessment (AIR) Cycle;Evaluate if patient comfort function goal is met       Problem: Inadequate Gas Exchange  Goal: Adequate oxygenation and improved ventilation  Outcome: Progressing   01/20/17 0557   Goal/Interventions addressed this shift   Adequate oxygenation and improved ventilation Assess lung sounds;Position for maximum ventilatory efficiency       Comments: Pt vss. Tolerating TF well. One episode of loose BM. Turned Q2H. Large amount of yellow thick secretions, freq suction needed. Will monitor.

## 2017-01-20 NOTE — Plan of Care (Signed)
Problem: Pain  Goal: Pain at adequate level as identified by patient  Outcome: Progressing   01/20/17 1832   Goal/Interventions addressed this shift   Pain at adequate level as identified by patient Identify patient comfort function goal;Assess for risk of opioid induced respiratory depression, including snoring/sleep apnea. Alert healthcare team of risk factors identified.;Reassess pain within 30-60 minutes of any procedure/intervention, per Pain Assessment, Intervention, Reassessment (AIR) Cycle;Evaluate if patient comfort function goal is met;Offer non-pharmacological pain management interventions;Include patient/patient care companion in decisions related to pain management as needed       Problem: Artificial Airway  Goal: Tracheostomy will be maintained  Outcome: Progressing   01/20/17 1832   Goal/Interventions addressed this shift   Tracheostomy will be maintained Suction secretions as needed;Keep head of bed at 30 degrees, unless contraindicated;Encourage/perform oral hygiene as appropriate;Perform deep oropharyngeal suctioning at least every 4 hours;Utilize tracheostomy securing device;Apply water-based moisturizer to lips;Support ventilator tubing to avoid pressure from drag of tubing;Tracheostomy care every shift and as needed;Maintain surgical airway kit or tracheostomy tray at bedside;Keep additional tracheostomy tube of the same size and one size smaller at bedside       Comments: Pt vitals WNL, continues tube feeding and tolerating well, afebrile, BM x1 loose, medium, remained on PRVC mode, Fio2  30% and inline/oral suction done as needed, pt turned and repsoiotned Q 2 hours and PRN, peri care/wound care/trach care done. Family visited and updated. Will continue to monitor per POC.

## 2017-01-20 NOTE — Progress Notes (Addendum)
PROGRESS NOTE    Date Time: 01/20/17 2:14 PM  Patient Name: [name],[name]      Assessment and Plan:     This is an 83 year old male resident of [name], cervical fracture/quadriparesis, trach/PEG, chronic coccygeal pressure sore, admitted 01/08/17 for sepsis/AMS.    1. Leukocytosis improving , ? New UTI, ? Pneumonia, ?bacteremia      2 Recent MDR Proteus UTI.  Zosyn mic </=2/4.  3.  Pseudomonas/Serratia pneumonia (cx from 1/30)   4.  GPR bacteremia on 1/30 (isolate was not identified here and sent to reference lab) only grew on the anaerobic cx  5 New RUE DVT   RECS   1 Repeat blood cx   2 F/u urine cx( UA not done)   3 Repeat CXR in AM. Might require CT chest for better evaluation pending clinical improvement   Repeat procalcitonin in AM   Subjective:   Tmax of 99.1     Review of Systems:   Unable to obtain     Antibiotics:   Zosyn      Central Access/Endovascular Hardware:   Trach  PIV   PEG  Physical Exam:   Temp:  [97.8 F (36.6 C)-99.1 F (37.3 C)] 97.8 F (36.6 C)  Heart Rate:  [77-121] 87  Resp Rate:  [18-20] 18  BP: (98-131)/(54-87) 98/54  FiO2:  [30 %-40 %] 30 %    Constitutional: ill appearing  Neck: trach   Cardiovascular: irreg, irreg   Pulmonary/Chest: anterior rhonchi   Abdominal: soft, distended, PEG.   Extremities: trace edema, VS changes.  Skin: No obvious drug rash.   Neurological: open eyes, not following commands      Labs:     Results     Procedure Component Value Units Date/Time    Glucose Whole Blood - POCT [[id number]]  (Abnormal) Collected:  01/20/17 1118     Updated:  01/20/17 1253     POCT - Glucose Whole blood 110 (H) mg/dL     Glucose Whole Blood - POCT [[id number]]  (Abnormal) Collected:  01/20/17 0018     Updated:  01/20/17 0758     POCT - Glucose Whole blood 104 (H) mg/dL     CBC and differential [[id number]]  (Abnormal) Collected:  01/20/17 0359    Specimen:  Blood from Blood Updated:  01/20/17 0515     WBC 12.82 (H) x10 3/uL      Hgb 8.4 (L) g/dL      Hematocrit 08.6 (L) %       Platelets 93 (L) x10 3/uL      RBC 3.22 (L) x10 6/uL      MCV 83.2 fL      MCH 26.1 (L) pg      MCHC 31.3 (L) g/dL      RDW 17 (H) %      MPV 13.1 (H) fL      Neutrophils 81.5 %      Lymphocytes Automated 8.3 %      Monocytes 8.0 %      Eosinophils Automated 1.1 %      Basophils Automated 0.2 %      Immature Granulocyte 0.9 %      Nucleated RBC 0.0 /100 WBC      Neutrophils Absolute 10.46 (H) x10 3/uL      Abs Lymph Automated 1.07 x10 3/uL      Abs Mono Automated 1.02 x10 3/uL      Abs Eos Automated 0.14 x10 3/uL  Absolute Baso Automated 0.02 x10 3/uL      Absolute Immature Granulocyte 0.11 (H) x10 3/uL      Absolute NRBC 0.00 x10 3/uL     Narrative:       Blood in lab    Basic Metabolic Panel [[id number]]  (Abnormal) Collected:  01/20/17 0359    Specimen:  Blood Updated:  01/20/17 0511     Glucose 89 mg/dL      BUN 19.1 (H) mg/dL      Creatinine 0.7 mg/dL      Calcium 8.2 mg/dL      Sodium 478 mEq/L      Potassium 3.6 mEq/L      Chloride 102 mEq/L      CO2 28 mEq/L      Anion Gap 8.0    Hemolysis index [[id number]] Collected:  01/20/17 0359     Updated:  01/20/17 0511     Hemolysis Index 18    GFR [[id number]] Collected:  01/20/17 0359     Updated:  01/20/17 0511     EGFR >60.0    Urine culture [[id number]] Collected:  01/19/17 1748    Specimen:  Urine from Urine, Catheterized, In & Out Updated:  01/19/17 2145    CBC and differential [[id number]]  (Abnormal) Collected:  01/19/17 1753    Specimen:  Blood from Blood Updated:  01/19/17 1801     WBC 13.14 (H) x10 3/uL      Hgb 8.5 (L) g/dL      Hematocrit 66.4 (L) %      Platelets 256 x10 3/uL      RBC 3.22 (L) x10 6/uL      MCV 86.0 fL      MCH 26.4 (L) pg      MCHC 30.7 (L) g/dL      RDW 17 (H) %      MPV 12.4 (H) fL      Neutrophils 80.4 %      Lymphocytes Automated 8.7 %      Monocytes 7.2 %      Eosinophils Automated 2.6 %      Basophils Automated 0.2 %      Immature Granulocyte 0.9 %      Nucleated RBC 0.0 /100 WBC      Neutrophils Absolute 10.58 (H) x10 3/uL       Abs Lymph Automated 1.14 x10 3/uL      Abs Mono Automated 0.94 x10 3/uL      Abs Eos Automated 0.34 x10 3/uL      Absolute Baso Automated 0.02 x10 3/uL      Absolute Immature Granulocyte 0.12 (H) x10 3/uL      Absolute NRBC 0.00 x10 3/uL           Rads:     Radiology Results (24 Hour)     ** No results found for the last 24 hours. **          This case required  High level of complexity decision making and I spent greater than 25 minutes on the case and coordinating  care.    Signed by: Trula Ore  [facility]  [phone] (Office)  [phone] (Fax)

## 2017-01-20 NOTE — Progress Notes (Signed)
INTERNAL MEDICINE PROGRESS NOTE    Progress Note - [name]    Date Time: 01/20/17 2:44 PM  Patient Name: 83 y.o. male with Altered mental status, unspecified altered mental status type      Assessment :    Gram Positive Rod Bacteremia/Identification is pending   Repeat blood culture was no growth to date   Urine culture is pending   Pneumoniawith Bibasilar consolidation( sputum culture with Serrata &Pseudomonas)/on ceftazidime   Leukocytosis   Malfunction G-tube   Metabolic Encephalopathy   Acute on chronic respiratory failure - trached and ventilated   Cardiomyopathy with last ejection fraction at 40 percent   Hypertension   Chronic Atrial Fibrillation    Plan:    D/C Amlodipine for low  Blood pressure   Currently on Zosyn   Cytosis is trending down   CXR stable from today   Increase Bumex to 1 gm a day for high BNP   Increase metoprolo due to RVR   NS bolus PRN for low blood pressure   Finished course of zosyn for urinary tract infection   Follow blood cultures and and tailor antibiotics accordingly   Leukocytosis noted/ No fever   Pro calcitonin Level noted   Check BNP in the morning   Monitor electrolytes and renal function   Supportive therapy with vent support and enteral feeding.    GI prophylaxis   DVT prophylaxis.   Discussed plan of care with nurses.   Discussed case with consultants.    Subjective:   Incomplete database      Review of Systems:   Incomplete database    Physical Exam:     Vitals:    01/20/17 1331   BP:    Pulse:    Resp:    Temp:    SpO2: 98%       Intake/Output Summary (Last 24 hours) at 01/20/17 1444  Last data filed at 01/20/17 1200   Gross per 24 hour   Intake             1480 ml   Output             1350 ml   Net              130 ml      General:awake and respnsive   Heent:pinkish conjunctiva, anicteric sclera, dry mucus membrane   Neck: tracheostomy in place   Cvs:S1 &S 2 well heard, regular rate and rhythm   Chest:Clear to  auscultation   Abdomen:Soft, non-tender, active bowel sounds.   Ext : no cyanosis, no edema   CNS: awake and more alert today      Meds Reviewed: Yes  Medications:     Current Facility-Administered Medications   Medication Dose Route Frequency   . albuterol-ipratropium  3 mL Nebulization Q6H   . amiodarone  200 mg Oral Daily   . atorvastatin  20 mg per G tube QHS   . balsam peru-castor oil (VENELEX)   Topical Q12H   . bumetanide  1 mg per G tube Daily   . famotidine  20 mg Oral QHS   . glycopyrrolate  1 mg per G tube Daily   . lactobacillus/streptococcus  1 capsule per G tube Daily   . metoclopramide  5 mg per G tube Q8H   . metoprolol tartrate  50 mg per G tube BID   . piperacillin-tazobactam  4.5 g Intravenous Q8H SCH   . QUEtiapine  25 mg Oral  QHS   . rivaroxaban  20 mg per G tube Daily with dinner   . scopolamine  1 patch Transdermal Q72H       PRN Medication:  acetaminophen **OR** acetaminophen, naloxone, ondansetron **OR** ondansetron, oxyCODONE-acetaminophen, senna, sodium chloride      Labs:     Results     Procedure Component Value Units Date/Time    Glucose Whole Blood - POCT [[id number]]  (Abnormal) Collected:  01/20/17 1118     Updated:  01/20/17 1253     POCT - Glucose Whole blood 110 (H) mg/dL     Glucose Whole Blood - POCT [[id number]]  (Abnormal) Collected:  01/20/17 0018     Updated:  01/20/17 0758     POCT - Glucose Whole blood 104 (H) mg/dL     CBC and differential [[id number]]  (Abnormal) Collected:  01/20/17 0359    Specimen:  Blood from Blood Updated:  01/20/17 0515     WBC 12.82 (H) x10 3/uL      Hgb 8.4 (L) g/dL      Hematocrit 08.6 (L) %      Platelets 93 (L) x10 3/uL      RBC 3.22 (L) x10 6/uL      MCV 83.2 fL      MCH 26.1 (L) pg      MCHC 31.3 (L) g/dL      RDW 17 (H) %      MPV 13.1 (H) fL      Neutrophils 81.5 %      Lymphocytes Automated 8.3 %      Monocytes 8.0 %      Eosinophils Automated 1.1 %      Basophils Automated 0.2 %      Immature Granulocyte 0.9 %      Nucleated RBC 0.0  /100 WBC      Neutrophils Absolute 10.46 (H) x10 3/uL      Abs Lymph Automated 1.07 x10 3/uL      Abs Mono Automated 1.02 x10 3/uL      Abs Eos Automated 0.14 x10 3/uL      Absolute Baso Automated 0.02 x10 3/uL      Absolute Immature Granulocyte 0.11 (H) x10 3/uL      Absolute NRBC 0.00 x10 3/uL     Narrative:       Blood in lab    Basic Metabolic Panel [[id number]]  (Abnormal) Collected:  01/20/17 0359    Specimen:  Blood Updated:  01/20/17 0511     Glucose 89 mg/dL      BUN 52.8 (H) mg/dL      Creatinine 0.7 mg/dL      Calcium 8.2 mg/dL      Sodium 413 mEq/L      Potassium 3.6 mEq/L      Chloride 102 mEq/L      CO2 28 mEq/L      Anion Gap 8.0    Hemolysis index [[id number]] Collected:  01/20/17 0359     Updated:  01/20/17 0511     Hemolysis Index 18    GFR [[id number]] Collected:  01/20/17 0359     Updated:  01/20/17 0511     EGFR >60.0    Urine culture [[id number]] Collected:  01/19/17 1748    Specimen:  Urine from Urine, Catheterized, In & Out Updated:  01/19/17 2145    CBC and differential [[id number]]  (Abnormal) Collected:  01/19/17 1753    Specimen:  Blood from Blood Updated:  01/19/17 1801  WBC 13.14 (H) x10 3/uL      Hgb 8.5 (L) g/dL      Hematocrit 57.8 (L) %      Platelets 256 x10 3/uL      RBC 3.22 (L) x10 6/uL      MCV 86.0 fL      MCH 26.4 (L) pg      MCHC 30.7 (L) g/dL      RDW 17 (H) %      MPV 12.4 (H) fL      Neutrophils 80.4 %      Lymphocytes Automated 8.7 %      Monocytes 7.2 %      Eosinophils Automated 2.6 %      Basophils Automated 0.2 %      Immature Granulocyte 0.9 %      Nucleated RBC 0.0 /100 WBC      Neutrophils Absolute 10.58 (H) x10 3/uL      Abs Lymph Automated 1.14 x10 3/uL      Abs Mono Automated 0.94 x10 3/uL      Abs Eos Automated 0.34 x10 3/uL      Absolute Baso Automated 0.02 x10 3/uL      Absolute Immature Granulocyte 0.12 (H) x10 3/uL      Absolute NRBC 0.00 x10 3/uL             @MICROIP @    IRadiology:   Radiological Procedure reviewed.      Hershal Coria, MD  Spectra link  [id number]  [facility]  [phone]  01/20/2017  2:44 PM

## 2017-01-21 ENCOUNTER — Inpatient Hospital Stay: Payer: Medicare Other

## 2017-01-21 LAB — CBC AND DIFFERENTIAL
Absolute NRBC: 0 10*3/uL
Basophils Absolute Automated: 0.01 10*3/uL (ref 0.00–0.20)
Basophils Automated: 0.1 %
Eosinophils Absolute Automated: 0.14 10*3/uL (ref 0.00–0.70)
Eosinophils Automated: 1.2 %
Hematocrit: 27.9 % — ABNORMAL LOW (ref 42.0–52.0)
Hgb: 8.7 g/dL — ABNORMAL LOW (ref 13.0–17.0)
Immature Granulocytes Absolute: 0.13 10*3/uL — ABNORMAL HIGH
Immature Granulocytes: 1.2 %
Lymphocytes Absolute Automated: 1.08 10*3/uL (ref 0.50–4.40)
Lymphocytes Automated: 9.6 %
MCH: 26.4 pg — ABNORMAL LOW (ref 28.0–32.0)
MCHC: 31.2 g/dL — ABNORMAL LOW (ref 32.0–36.0)
MCV: 84.5 fL (ref 80.0–100.0)
MPV: 13.2 fL — ABNORMAL HIGH (ref 9.4–12.3)
Monocytes Absolute Automated: 1.1 10*3/uL (ref 0.00–1.20)
Monocytes: 9.8 %
Neutrophils Absolute: 8.8 10*3/uL — ABNORMAL HIGH (ref 1.80–8.10)
Neutrophils: 78.1 %
Nucleated RBC: 0 /100 WBC (ref 0.0–1.0)
Platelets: 96 10*3/uL — ABNORMAL LOW (ref 140–400)
RBC: 3.3 10*6/uL — ABNORMAL LOW (ref 4.70–6.00)
RDW: 17 % — ABNORMAL HIGH (ref 12–15)
WBC: 11.26 10*3/uL — ABNORMAL HIGH (ref 3.50–10.80)

## 2017-01-21 LAB — GLUCOSE WHOLE BLOOD - POCT
Whole Blood Glucose POCT: 114 mg/dL — ABNORMAL HIGH (ref 70–100)
Whole Blood Glucose POCT: 108 mg/dL — ABNORMAL HIGH (ref 70–100)
Whole Blood Glucose POCT: 143 mg/dL — ABNORMAL HIGH (ref 70–100)

## 2017-01-21 LAB — HEMOLYSIS INDEX: Hemolysis Index: 3 (ref 0–18)

## 2017-01-21 LAB — BASIC METABOLIC PANEL
Anion Gap: 8 (ref 5.0–15.0)
BUN: 26 mg/dL (ref 9.0–28.0)
CO2: 27 mEq/L (ref 22–29)
Calcium: 8.2 mg/dL (ref 7.9–10.2)
Chloride: 104 mEq/L (ref 100–111)
Creatinine: 0.7 mg/dL (ref 0.7–1.3)
Glucose: 108 mg/dL — ABNORMAL HIGH (ref 70–100)
Potassium: 4 mEq/L (ref 3.5–5.1)
Sodium: 139 mEq/L (ref 136–145)

## 2017-01-21 LAB — GFR: EGFR: >60

## 2017-01-21 LAB — PROCALCITONIN: Procalcitonin: 0.1 (ref 0.0–0.1)

## 2017-01-21 NOTE — Plan of Care (Signed)
Problem: Safety  Goal: Patient will be free from injury during hospitalization  Outcome: Progressing   01/21/17 1815   Goal/Interventions addressed this shift   Patient will be free from injury during hospitalization  Assess patient's risk for falls and implement fall prevention plan of care per policy;Provide and maintain safe environment;Use appropriate transfer methods;Hourly rounding;Ensure appropriate safety devices are available at the bedside;Provide alternative method of communication if needed [employer], writing);Include patient/ family/ care giver in decisions related to safety       Problem: Artificial Airway  Goal: Tracheostomy will be maintained  Outcome: Progressing   01/21/17 1815   Goal/Interventions addressed this shift   Tracheostomy will be maintained Suction secretions as needed;Keep head of bed at 30 degrees, unless contraindicated;Encourage/perform oral hygiene as appropriate;Perform deep oropharyngeal suctioning at least every 4 hours;Apply water-based moisturizer to lips;Utilize tracheostomy securing device;Support ventilator tubing to avoid pressure from drag of tubing;Tracheostomy care every shift and as needed;Maintain surgical airway kit or tracheostomy tray at bedside;Keep additional tracheostomy tube of the same size and one size smaller at bedside       Comments: Pt vitals WNL, continues tube feeding and tolerating well, afebrile, BM x2 loose, medium, remained on PRVC mode, Fio2  30% and inline/oral suction done as needed, pt turned and repsoiotned Q 2 hours and PRN, peri care/wound care/trach care done. Pt's son called and updated. Will continue to monitor per plan of care.

## 2017-01-21 NOTE — Plan of Care (Signed)
Problem: Compromised Hemodynamic Status  Goal: Vital signs and fluid balance maintained/improved  Outcome: Progressing   01/21/17 0526   Goal/Interventions addressed this shift   Vital signs and fluid balance are maintained/improved Position patient for maximum circulation/cardiac output;Monitor/assess vitals and hemodynamic parameters with position changes;Monitor intake and output. Notify LIP if urine output is less than 30 mL/hour.;Monitor/assess lab values and report abnormal values      01/21/17 0526   Goal/Interventions addressed this shift   Vital signs and fluid balance are maintained/improved Position patient for maximum circulation/cardiac output;Monitor/assess vitals and hemodynamic parameters with position changes;Monitor intake and output. Notify LIP if urine output is less than 30 mL/hour.;Monitor/assess lab values and report abnormal values      01/21/17 0526   Goal/Interventions addressed this shift   Vital signs and fluid balance are maintained/improved Position patient for maximum circulation/cardiac output;Monitor/assess vitals and hemodynamic parameters with position changes;Monitor intake and output. Notify LIP if urine output is less than 30 mL/hour.;Monitor/assess lab values and report abnormal values     Pt is afebrile, VSS, Afib with HR 80-120s, tolerating TF, oxygen saturation of 100 on 30% Fio2. No pain behaviors noted; will continue to monitor pt

## 2017-01-21 NOTE — Progress Notes (Signed)
Infectious Diseases & Tropical Medicine  Progress Note    01/21/2017   [name] ZOX:[id number] is a 83 y.o. male,       Assessment:      Sepsis.   Gram-positive rods bacteremia (2/2)-identification still pending as per microbiology    Altered mental status    Chest x-ray-worsening pulmonary edema/moderate bilateral pleural effusions (01/21/2017)   Sputum culture- Serratia marcescens and Pseudomonas   Pro-calcitonin level-6.7 (01/09/2017).   Pro-calcitonin level-3.9 (01/10/2017)    Pro-calcitonin level-0.2 (01/15/2017)   Pro-calcitonin level-0.4 (01/17/2017)   Pro-calcitonin level-0.1(01/21/2017)   Urine culture-MDR Proteus mirabilis-Status post 5 days course of treatment (gentamicin and Zosyn).   Repeat urine culture no growth   External catheter in place   Blood cultures no growth to date.   Leukocytosis improving   History of quadriparesis secondary to neck fracture.   Status post tracheostomy andG-tube placement.   Resident of [facility] nursing [facility]    Plan:      Continue Zosyn (day number 3)   Repeat blood culture pending and if negative, discontinue antibiotics   Continue probiotics.   Monitor electrolytes and renal functions closely.   Monitor clinically .   Discussed with pharmacy.   Discussed with Dr.Anbessie    ROS:     General: No fever, no chills, no rigor,awake, noncommunicative  Respiratory: no cough, shortness of breath, or wheezing   Gastrointestinal: no diarrhea  Genito-Urinary: no hematuria  Musculoskeletal: no edema  Neurological: Awake  Dermatological: no rash    Physical Examination:     Blood pressure 102/56, pulse (!) 108, temperature 98.5 F (36.9 C), temperature source Axillary, resp. rate 18, height 1.854 m (6' 0.99"), weight 103.9 kg (229 lb), SpO2 100 %.     General Appearance: Comfortable, and in no acute distress.   HEENT: Pupils are equal, round, and reactive to light.    Lungs: Decreased breath sounds   Heart: Tachycardia   Chest:  Symmetric chest wall expansion.    Abdomen: soft ,non tender,no hepatosplenomegaly,good bowel sounds   Neurological: Paraplegia   Extremities: No edema,water filled blisters over the glans penis    Laboratory And Diagnostic Studies:     Recent Labs      01/21/17   0345  01/20/17   0359   WBC  11.26*  12.82*   Hgb  8.7*  8.4*   Hematocrit  27.9*  26.8*   Platelets  96*  93*     Recent Labs      01/21/17   0345  01/20/17   0359   Sodium  139  138   Potassium  4.0  3.6   Chloride  104  102   CO2  27  28   BUN  26.0  29.0*   Creatinine  0.7  0.7   Glucose  108*  89   Calcium  8.2  8.2     No results for input(s): AST, ALT, ALKPHOS, PROT, ALB in the last 72 hours.    Current Meds:      Scheduled Meds: PRN Meds:        albuterol-ipratropium 3 mL Nebulization Q6H   amiodarone 200 mg Oral Daily   atorvastatin 20 mg per G tube QHS   balsam peru-castor oil (VENELEX)  Topical Q12H   bumetanide 1 mg per G tube Daily   famotidine 20 mg Oral QHS   glycopyrrolate 1 mg per G tube Daily   lactobacillus/streptococcus 1 capsule per G tube Daily   metoclopramide 5  mg per G tube Q8H   metoprolol tartrate 50 mg per G tube BID   piperacillin-tazobactam 4.5 g Intravenous Q8H SCH   QUEtiapine 25 mg Oral QHS   rivaroxaban 20 mg per G tube Daily with dinner   scopolamine 1 patch Transdermal Q72H       Continuous Infusions:     acetaminophen 650 mg Q4H PRN   Or     acetaminophen 650 mg Q4H PRN   naloxone 0.2 mg PRN   ondansetron 4 mg Q6H PRN   Or     ondansetron 4 mg Q6H PRN   oxyCODONE-acetaminophen 1 tablet Q6H PRN   senna 17.2 mg QD PRN   sodium chloride 200 mL Q12H PRN         Oluwaseun Bruyere A. Janalyn Rouse, M.D.  01/21/2017  6:49 AM

## 2017-01-21 NOTE — Progress Notes (Signed)
Nutritional Support Services  Nutrition Follow-up    [name] 83 y.o. male   MRN: [id number]    Summary of Nutrition Recommendations:   1. Continue Promote via PEG @ goal rate of 60 mls/hr.   Provides 1440 total kcals, 90 gm protein, and 1208 mls free water in 1440 total mls    2. Continue 3 packets Prosource daily  Each packet Prosource provides 15 gm protein and 60 kcal.    EN + Prosource provides 1620 total kcals, 135 gm protein, and 1208 mls free water in 1440 total mls.  Meets 100% estimated kcal and protein needs.     3. Flush tube with minimum of 30-50 ml Q 4 hours for tube patency- additional fluids for hydration per MD    4. Continue weight monitoring     5. Monitor bowel movement frequency - pt with BM 2/10    Recommendations discussed with RN Lafonda Mosses.  -----------------------------------------------------------------------------------------------------------------                                                     ASSESSMENT DATA     Subjective Nutrition: F/U to RDN full assessment 2/5. Pt sleeping during time of RDN visit, RDN did not disturb. RDN observed Promote infusing at 60 mls/hr (goal rate). Spoke with RN Lafonda Mosses, reports pt with no problems with tolerance of TF, denies residuals and vomiting. States pt with BM yesterday. Noted pt with weight gain since last RDN visit, likely due to pt with +1 generalized edema. Unable to perform nutrition focused physical examination at this time due to pt sleeping, will attempt at next RDN follow-up. Per RN Lafonda Mosses, pt's wound improving.     Events of Current Admission:  Pt with gram positive rod bacteremia, PNA, leukocytosis, G-tube malfunction s/p replacement, metabolic encephalopathy, acute on chronic respiratory failure, cardiomyopathy, HTN, chronic a fib    Medical Hx:  has a past medical history of C2 cervical fracture; Chronic pain; Conjunctivitis; Constipation; Cutaneous abscess of buttock;  Dysphagia; Hypertension; Insomnia; Kidney disorder; and Respiratory arrest.     Diet Hx/Social Hx: from [name], single    Current Diet Order      Diet NPO effective now  Enteral: Infusion per [name] Pump history:  Past 24 hours: 1306 mL, 150 mL water flushes (91% estimated kcal needs from Promote only,  90% estimated protein needs from Promote only)    ANTHROPOMETRIC  Anthropometrics  Height: 185.4 cm (6' 0.99")  Weight: 103.9 kg (229 lb)  Weight Change: 5.24  IBW/kg (Calculated) Male: 83.65 kg  IBW/kg (Calculated) Male: 74.95 kg  BMI (calculated): 29.5    Weight Monitoring 12/26/2016 01/08/2017 01/08/2017 01/09/2017 01/10/2017 01/12/2017 01/19/2017   Height - - 185.4 cm 185.4 cm - - -   Height Method - - Estimated (No Data) - - -   Weight 109.1 kg 109 kg 97.75 kg 101.2 kg 100.699 kg 98.7 kg 103.874 kg   Weight Method Bed Scale - - Bed Scale - Bed Scale Bed Scale   BMI (calculated) - - 28.5 kg/m2 29.5 kg/m2 - - -     Weight Summary: pt with weight gain since last RDN visit, likely due to pt with +1 generalized edema, will continue to monitor    Physical Assessment:   Unable to perform nutrition focused physical examination due to pt sleeping. Observed no  signs of subcutaneous fat or muscle loss and noted no signs of malnutrition identified during nutrition focused physical examination 1/31.  Edema: +1 generalized edema, per RN flowsheet  Skin: stage 2 pressure injury sacrum, per RN flowsheet  GI function: + BM 2/10, per RN flowsheet    ESTIMATED NEEDS  Estimated Energy Needs  Total Energy Estimated Needs: 1610-9604 kcals/day  Method for Estimating Needs: 15-20 kcals/kg ABW of 97.8kg (BMI 30, tele pt)    Estimated Protein Needs  Total Protein Estimated Needs: 122-166 gm/day  Method for Estimating Needs: 1.5-2.0 gm/kg IBW of 83kg (BMI 30, tele pt, stage 2 wound)    Fluid Needs  Method for Estimating Needs: 1 ml/kcal or per MD    Pertinent Medications: Reviewed; lipitor, risaquad, reglan    Pertinent labs: Reviewed;  Glucose 89-116 x past 24 hrs    Learning Needs:  None at this time                                                     NUTRITION DIAGNOSIS     Inadequate oral intake related to respiratory failure as evidenced by mechanical ventilation/NPO and pt awaiting PEG replacement. (Resolving - pt received 28% estimated kcal and protein needs x past 24 hrs per pump hx)    Will continue to monitor weight hx, pump infusion hx, and results of nutrition focused physical examinations for 2 qualifying criteria for malnutrition.                                                            INTERVENTION     Nutrition recommendation -   Summary of Nutrition Recommendations:   1. Continue Promote via PEG @ goal rate of 60 mls/hr.   Provides 1440 total kcals, 90 gm protein, and 1208 mls free water in 1440 total mls    2. Continue 3 packets Prosource daily  Each packet Prosource provides 15 gm protein and 60 kcal.    EN + Prosource provides 1620 total kcals, 135 gm protein, and 1208 mls free water in 1440 total mls.  Meets 100% estimated kcal and protein needs.     3. Flush tube with minimum of 30-50 ml Q 4 hours for tube patency- additional fluids for hydration per MD    4. Continue weight monitoring     5. Monitor bowel movement frequency - pt with BM 2/10    Goal:Pt will tolerate >80% TF goal rate x 24-48 hrs (Ongoing)                                                         MONITORING     Monitor EN tolerance, labs, GI function, and weight.  EVALUATION     Nutrition Risk Level: Moderate (will follow up within 7 days and PRN)     Daine Gip, RDN   Ext. [phone]

## 2017-01-21 NOTE — Progress Notes (Signed)
MCR Fx PNA, 30D READM    From [facility] resp unit. Urine and blood cultures pending, cont IV ABT q8hr    DCP return to [facility], ambulance to transport    Will need to update [facility] w/ isolation needs at [place]. If requiring iso at [place], could be barrier for return to [place].    Madie Reno, RN  Clinical Case Manager I  [phone]

## 2017-01-21 NOTE — Progress Notes (Signed)
INTERNAL MEDICINE PROGRESS NOTE    Progress Note - [name]    Date Time: 01/21/17 9:46 AM  Patient Name: 83 y.o. male with Altered mental status, unspecified altered mental status type      Assessment :    Gram Positive Rod Bacteremia/Identification is pending   Repeat blood culture was no growth to date   Urine culture And significant   Leukocytosis is trending down   Procalcitonin is trending down   Pneumoniawith Bibasilar consolidation( sputum culture with Serrata &Pseudomonas)/on ceftazidime   Leukocytosis   Malfunction G-tube   Metabolic Encephalopathy   Acute on chronic respiratory failure - trached and ventilated   Cardiomyopathy with last ejection fraction at 40 percent   Hypertension   Chronic Atrial Fibrillation      Plan:    D/C Amlodipine for low Blood pressure   Currently on Zosyn   LeucoCytosis is trending down   CXR results noted   Increase Bumex to 1 gm a day for high BNP   Increase metoprolo due to RVR   NS bolus PRN for low blood pressure   Finished course of zosyn for urinary tract infection   Follow blood cultures and and tailor antibiotics accordingly   Leukocytosis noted/ No fever   Pro calcitonin Level noted   Check BNP in the morning   Monitor electrolytes and renal function   Supportive therapy with vent support and enteral feeding.    GI prophylaxis   DVT prophylaxis.   Discussed plan of care with nurses.   Discussed case with consultants.    Subjective:   Incomplete database      Review of Systems:    Noncontributory except mentioned in subjective    Physical Exam:     Vitals:    01/21/17 0811   BP:    Pulse:    Resp:    Temp: 98.1 F (36.7 C)   SpO2: 100%       Intake/Output Summary (Last 24 hours) at 01/21/17 0946  Last data filed at 01/21/17 0600   Gross per 24 hour   Intake             1225 ml   Output             1600 ml   Net             -375 ml    General:awake and respnsive   Heent:pinkish conjunctiva, anicteric sclera, dry  mucus membrane   Neck: tracheostomy in place   Cvs:S1 &S 2 well heard, regular rate and rhythm   Chest:Clear to auscultation   Abdomen:Soft, non-tender, active bowel sounds.   Ext : no cyanosis, no edema  CNS: awake and more alert today          Meds Reviewed: Yes  Medications:     Current Facility-Administered Medications   Medication Dose Route Frequency   . albuterol-ipratropium  3 mL Nebulization Q6H   . amiodarone  200 mg Oral Daily   . atorvastatin  20 mg per G tube QHS   . balsam peru-castor oil (VENELEX)   Topical Q12H   . bumetanide  1 mg per G tube Daily   . famotidine  20 mg Oral QHS   . glycopyrrolate  1 mg per G tube Daily   . lactobacillus/streptococcus  1 capsule per G tube Daily   . metoclopramide  5 mg per G tube Q8H   . metoprolol tartrate  50 mg per G tube  BID   . piperacillin-tazobactam  4.5 g Intravenous Q8H SCH   . QUEtiapine  25 mg Oral QHS   . rivaroxaban  20 mg per G tube Daily with dinner   . scopolamine  1 patch Transdermal Q72H       PRN Medication:  acetaminophen **OR** acetaminophen, naloxone, ondansetron **OR** ondansetron, oxyCODONE-acetaminophen, senna, sodium chloride      Labs:     Results     Procedure Component Value Units Date/Time    Procalcitonin [[id number]] Collected:  01/21/17 0345     Updated:  01/21/17 0927    Basic Metabolic Panel [[id number]]  (Abnormal) Collected:  01/21/17 0345    Specimen:  Blood Updated:  01/21/17 0455     Glucose 108 (H) mg/dL      BUN 27.0 mg/dL      Creatinine 0.7 mg/dL      Calcium 8.2 mg/dL      Sodium 623 mEq/L      Potassium 4.0 mEq/L      Chloride 104 mEq/L      CO2 27 mEq/L      Anion Gap 8.0    Hemolysis index [[id number]] Collected:  01/21/17 0345     Updated:  01/21/17 0455     Hemolysis Index 3    GFR [[id number]] Collected:  01/21/17 0345     Updated:  01/21/17 0455     EGFR >60.0    CBC and differential [[id number]]  (Abnormal) Collected:  01/21/17 0345    Specimen:  Blood from Blood Updated:  01/21/17 0446     WBC 11.26 (H) x10  3/uL      Hgb 8.7 (L) g/dL      Hematocrit 27.0 (L) %      Platelets 96 (L) x10 3/uL      RBC 3.30 (L) x10 6/uL      MCV 84.5 fL      MCH 26.4 (L) pg      MCHC 31.2 (L) g/dL      RDW 17 (H) %      MPV 13.2 (H) fL      Neutrophils 78.1 %      Lymphocytes Automated 9.6 %      Monocytes 9.8 %      Eosinophils Automated 1.2 %      Basophils Automated 0.1 %      Immature Granulocyte 1.2 %      Nucleated RBC 0.0 /100 WBC      Neutrophils Absolute 8.80 (H) x10 3/uL      Abs Lymph Automated 1.08 x10 3/uL      Abs Mono Automated 1.10 x10 3/uL      Abs Eos Automated 0.14 x10 3/uL      Absolute Baso Automated 0.01 x10 3/uL      Absolute Immature Granulocyte 0.13 (H) x10 3/uL      Absolute NRBC 0.00 x10 3/uL     Glucose Whole Blood - POCT [[id number]]  (Abnormal) Collected:  01/21/17 0335     Updated:  01/21/17 0357     POCT - Glucose Whole blood 114 (H) mg/dL     CULTURE BLOOD AEROBIC AND ANAEROBIC [[id number]] Collected:  01/20/17 2115    Specimen:  Blood, Venipuncture Updated:  01/21/17 0350    Narrative:       1 BLUE+1 PURPLE    Glucose Whole Blood - POCT [[id number]]  (Abnormal) Collected:  01/20/17 2322     Updated:  01/20/17 2327     POCT -  Glucose Whole blood 127 (H) mg/dL     Urine culture [[id number]] Collected:  01/19/17 1748    Specimen:  Urine from Urine, Catheterized, In & Out Updated:  01/20/17 1946    Narrative:       ORDER#: [id number]                                    ORDERED BY: Oval Linsey  SOURCE: Urine, Catheterized, In & Out                COLLECTED:  01/19/17 17:48  ANTIBIOTICS AT COLL.:                                RECEIVED :  01/19/17 21:45  Culture Urine                              FINAL       01/20/17 19:46  01/20/17   No growth of >1,000 CFU/ML, No further work      Glucose Whole Blood - POCT [[id number]]  (Abnormal) Collected:  01/20/17 1800     Updated:  01/20/17 1811     POCT - Glucose Whole blood 116 (H) mg/dL     Glucose Whole Blood - POCT [[id number]]  (Abnormal) Collected:  01/20/17 1118      Updated:  01/20/17 1253     POCT - Glucose Whole blood 110 (H) mg/dL             @MICROIP @    IRadiology:   Radiological Procedure reviewed.      Hershal Coria, MD  [id number]  [facility] clinic  [phone]  01/21/2017  9:46 AM

## 2017-01-22 LAB — GLUCOSE WHOLE BLOOD - POCT: Whole Blood Glucose POCT: 124 mg/dL — ABNORMAL HIGH (ref 70–100)

## 2017-01-22 MED ORDER — BUMETANIDE 1 MG PO TABS
1.0000 mg | ORAL_TABLET | Freq: Two times a day (BID) | ORAL | Status: DC
Start: 2017-01-23 — End: 2017-01-25
  Administered 2017-01-23 – 2017-01-25 (×4): 1 mg via GASTROSTOMY
  Filled 2017-01-22 (×8): qty 1

## 2017-01-22 NOTE — Plan of Care (Signed)
Problem: Artificial Airway  Goal: Tracheostomy will be maintained  Outcome: Progressing   01/22/17 0504   Goal/Interventions addressed this shift   Tracheostomy will be maintained Suction secretions as needed;Keep head of bed at 30 degrees, unless contraindicated;Encourage/perform oral hygiene as appropriate;Perform deep oropharyngeal suctioning at least every 4 hours;Apply water-based moisturizer to lips;Support ventilator tubing to avoid pressure from drag of tubing;Tracheostomy care every shift and as needed;Maintain surgical airway kit or tracheostomy tray at bedside;Keep additional tracheostomy tube of the same size and one size smaller at bedside

## 2017-01-22 NOTE — Progress Notes (Signed)
CM spoke w/ dtr Erie Noe today re: DCP. Dtr reports pt to return to [facility] when ready for [place]. CM discussed isolation needs currently and that isolation could be a barrier if required by the SNF. CM will cont to follow for DCP    Madie Reno, RN  Clinical Case Manager I  [phone]

## 2017-01-22 NOTE — Plan of Care (Signed)
Problem: Safety  Goal: Patient will be free from injury during hospitalization  Outcome: Progressing   01/22/17 1812   Goal/Interventions addressed this shift   Patient will be free from injury during hospitalization  Assess patient's risk for falls and implement fall prevention plan of care per policy;Provide and maintain safe environment;Ensure appropriate safety devices are available at the bedside;Include patient/ family/ care giver in decisions related to safety;Hourly rounding;Provide alternative method of communication if needed (communication boards, writing);Use appropriate transfer methods       Problem: Inadequate Gas Exchange  Goal: Adequate oxygenation and improved ventilation  Outcome: Progressing   01/22/17 1812   Goal/Interventions addressed this shift   Adequate oxygenation and improved ventilation Assess lung sounds;Provide mechanical and oxygen support to facilitate gas exchange;Position for maximum ventilatory efficiency;Teach/reinforce use of incentive spirometer 10 times per hour while awake, cough and deep breath as needed;Plan activities to conserve energy: plan rest periods;Increase activity as tolerated/progressive mobility;Consult/collaborate with Respiratory Therapy       Comments: Pt vitals WNL, continues tube feeding and tolerating well, afebrile, BM x1 loose small, remained on PRVC mode, Fio2 30%, inline/oral suction done as needed, pt turned and repsoiotned Q 2 hours and PRN, peri care/wound care/trach care done. Pt's son called and updated. Will continue to monitor per POC.

## 2017-01-22 NOTE — Progress Notes (Signed)
Infectious Diseases & Tropical Medicine  Progress Note    01/22/2017   [name] ZOX:[id number],XBJ:[id number] is a 83 y.o. male,       Assessment:      Sepsis.   Gram-positive rods bacteremia (2/2)-identification still pending as per microbiology    Altered mental status    Chest x-ray-worsening pulmonary edema/moderate bilateral pleural effusions (01/21/2017)   Sputum culture- Serratia marcescens and Pseudomonas   Pro-calcitonin level-6.7 (01/09/2017).   Pro-calcitonin level-3.9 (01/10/2017)    Pro-calcitonin level-0.2 (01/15/2017)   Pro-calcitonin level-0.4 (01/17/2017)   Pro-calcitonin level-0.1(01/21/2017)   Urine culture-MDR Proteus mirabilis-Status post 5 days course of treatment (gentamicin and Zosyn).   Repeat urine culture no growth.   Repeat blood culture no growth to date   External catheter in place   Blood cultures no growth to date.   Leukocytosis improving   History of quadriparesis secondary to neck fracture.   Status post tracheostomy andG-tube placement.   Resident of [facility] nursing [facility]    Plan:      Continue Zosyn - last dose tomorrow   Continue probiotics.   Monitor electrolytes and renal functions closely.   Monitor clinically .   Discussed with pharmacy.    ROS:     General: No fever, no chills, no rigor,Much more awake and comfortable today , noncommunicative  Respiratory: no cough, shortness of breath, or wheezing   Gastrointestinal: no diarrhea  Genito-Urinary: no hematuria  Musculoskeletal: no edema  Neurological: Awake  Dermatological: no rash    Physical Examination:     Blood pressure 109/57, pulse (!) 104, temperature (!) 96.8 F (36 C), resp. rate 17, height 1.854 m (6' 0.99"), weight 103.9 kg (229 lb), SpO2 100 %.     General Appearance: Comfortable, and in no acute distress.   HEENT: Pupils are equal, round, and reactive to light.    Lungs: Decreased breath sounds   Heart: Tachycardia   Chest: Symmetric chest wall expansion.    Abdomen: soft ,non  tender,no hepatosplenomegaly,good bowel sounds   Neurological: Paraplegia   Extremities: No edema,water filled blisters over the glans penis    Laboratory And Diagnostic Studies:     Recent Labs      01/21/17   0345  01/20/17   0359   WBC  11.26*  12.82*   Hgb  8.7*  8.4*   Hematocrit  27.9*  26.8*   Platelets  96*  93*     Recent Labs      01/21/17   0345  01/20/17   0359   Sodium  139  138   Potassium  4.0  3.6   Chloride  104  102   CO2  27  28   BUN  26.0  29.0*   Creatinine  0.7  0.7   Glucose  108*  89   Calcium  8.2  8.2     No results for input(s): AST, ALT, ALKPHOS, PROT, ALB in the last 72 hours.    Current Meds:      Scheduled Meds: PRN Meds:        albuterol-ipratropium 3 mL Nebulization Q6H   amiodarone 200 mg Oral Daily   atorvastatin 20 mg per G tube QHS   balsam peru-castor oil (VENELEX)  Topical Q12H   bumetanide 1 mg per G tube Daily   famotidine 20 mg Oral QHS   glycopyrrolate 1 mg per G tube Daily   lactobacillus/streptococcus 1 capsule per G tube Daily   metoclopramide 5 mg per  G tube Q8H   metoprolol tartrate 50 mg per G tube BID   piperacillin-tazobactam 4.5 g Intravenous Q8H SCH   QUEtiapine 25 mg Oral QHS   rivaroxaban 20 mg per G tube Daily with dinner   scopolamine 1 patch Transdermal Q72H       Continuous Infusions:     acetaminophen 650 mg Q4H PRN   Or     acetaminophen 650 mg Q4H PRN   naloxone 0.2 mg PRN   ondansetron 4 mg Q6H PRN   Or     ondansetron 4 mg Q6H PRN   oxyCODONE-acetaminophen 1 tablet Q6H PRN   senna 17.2 mg QD PRN   sodium chloride 200 mL Q12H PRN         Charidy Cappelletti A. Janalyn Rouse, M.D.  01/22/2017  9:11 AM

## 2017-01-22 NOTE — Progress Notes (Addendum)
INTERNAL MEDICINE PROGRESS NOTE    Progress Note - Birl Lobello    Date Time: 01/22/17 8:32 AM  Patient Name: 83 y.o. male with Altered mental status, unspecified altered mental status type      Assessment :    Gram Positive Rod Bacteremia/Identification is pending   Repeat blood culture with no growth to date   Urine culture-MDR Proteus mirabilis-Status post 5 days course of treatment (gentamicin and Zosyn/ Urine culture/Repeat non significant bacteria   Leukocytosis is trending down   Procalcitonin is trending down   Pneumoniawith Bibasilar consolidation( sputum culture with Serrata &Pseudomonas)/on zosyn   Leukocytosis   Malfunction G-tube/Replaced   Metabolic Encephalopathy/Resolving   Acute on chronic respiratory failure - trached and ventilated   Cardiomyopathy with last ejection fraction at 40 percent   Hypertension   Chronic Atrial Fibrillation on AC      Plan:    D/C Amlodipine for low Blood pressure   Currently on Zosyn and last dose tomorrow per ID   Increase bumex to 1 mg bid    LeucoCytosis is trending down   CXR results noted   Increase Bumex to 1 gm a day for high BNP   Increase metoprolo due to RVR   NS bolus PRN for low blood pressure   Follow final  blood cultures result and tailor antibiotics accordingly   Leukocytosis noted/ No fever   Pro calcitonin Level normalised    BNP at 173 noted   Monitor electrolytes and renal function   Supportive therapy with vent support and enteral feeding.    GI prophylaxis   DVT prophylaxis.   Discussed plan of care with nurses.   Discussed case with consultants.   Called his daughter vanessa at [phone] to call me back for feedback    Subjective:   ICDB      Review of Systems:    ICDB    Physical Exam:     Vitals:    01/22/17 0802   BP:    Pulse:    Resp:    Temp:    SpO2: 100%       Intake/Output Summary (Last 24 hours) at 01/22/17 1610  Last data filed at 01/22/17 0600   Gross per 24 hour   Intake               500 ml   Output             1291 ml   Net             -791 ml      General:awake and respnsive   Heent:pinkish conjunctiva, anicteric sclera, dry mucus membrane   Neck: tracheostomy in place   Cvs:S1 &S 2 well heard, regular rate and rhythm   Chest:Clear to auscultation   Abdomen:Soft, non-tender, active bowel sounds.   Quadriparesis noted    Medications:     Current Facility-Administered Medications   Medication Dose Route Frequency   . albuterol-ipratropium  3 mL Nebulization Q6H   . amiodarone  200 mg Oral Daily   . atorvastatin  20 mg per G tube QHS   . balsam peru-castor oil (VENELEX)   Topical Q12H   . bumetanide  1 mg per G tube Daily   . famotidine  20 mg Oral QHS   . glycopyrrolate  1 mg per G tube Daily   . lactobacillus/streptococcus  1 capsule per G tube Daily   . metoclopramide  5 mg per G  tube Q8H   . metoprolol tartrate  50 mg per G tube BID   . piperacillin-tazobactam  4.5 g Intravenous Q8H SCH   . QUEtiapine  25 mg Oral QHS   . rivaroxaban  20 mg per G tube Daily with dinner   . scopolamine  1 patch Transdermal Q72H       PRN Medication:  acetaminophen **OR** acetaminophen, naloxone, ondansetron **OR** ondansetron, oxyCODONE-acetaminophen, senna, sodium chloride      Labs:     Results     Procedure Component Value Units Date/Time    CULTURE BLOOD AEROBIC AND ANAEROBIC [[id number]] Collected:  01/20/17 2115    Specimen:  Blood, Venipuncture Updated:  01/22/17 0421    Narrative:       ORDER#: [id number]                                    ORDERED BY: Gearldine Shown  SOURCE: Blood, Venipuncture arm                      COLLECTED:  01/20/17 21:15  ANTIBIOTICS AT COLL.:                                RECEIVED :  01/21/17 03:50  Culture Blood Aerobic and Anaerobic        PRELIM      01/22/17 04:21  01/22/17   No Growth after 1 day/s of incubation.      Glucose Whole Blood - POCT [[id number]]  (Abnormal) Collected:  01/21/17 1858     Updated:  01/21/17 1910     POCT - Glucose Whole blood 143 (H)  mg/dL     Glucose Whole Blood - POCT [[id number]]  (Abnormal) Collected:  01/21/17 1318     Updated:  01/21/17 1348     POCT - Glucose Whole blood 108 (H) mg/dL     Procalcitonin [[id number]] Collected:  01/21/17 0345     Updated:  01/21/17 1319     Procalcitonin 0.1            @MICROIP @    IRadiology:   Radiological Procedure reviewed.      Hershal Coria, MD  Spectra link [id number]  [facility] clinic  [phone]  01/22/2017  8:32 AM

## 2017-01-23 LAB — CBC AND DIFFERENTIAL
Absolute NRBC: 0 10*3/uL
Basophils Absolute Automated: 0.02 10*3/uL (ref 0.00–0.20)
Basophils Automated: 0.2 %
Eosinophils Absolute Automated: 0.15 10*3/uL (ref 0.00–0.70)
Eosinophils Automated: 1.2 %
Hematocrit: 28.3 % — ABNORMAL LOW (ref 42.0–52.0)
Hgb: 8.9 g/dL — ABNORMAL LOW (ref 13.0–17.0)
Immature Granulocytes Absolute: 0.14 10*3/uL — ABNORMAL HIGH
Immature Granulocytes: 1.1 %
Lymphocytes Absolute Automated: 1.04 10*3/uL (ref 0.50–4.40)
Lymphocytes Automated: 8.3 %
MCH: 26.3 pg — ABNORMAL LOW (ref 28.0–32.0)
MCHC: 31.4 g/dL — ABNORMAL LOW (ref 32.0–36.0)
MCV: 83.5 fL (ref 80.0–100.0)
MPV: 14 fL — ABNORMAL HIGH (ref 9.4–12.3)
Monocytes Absolute Automated: 0.95 10*3/uL (ref 0.00–1.20)
Monocytes: 7.6 %
Neutrophils Absolute: 10.19 10*3/uL — ABNORMAL HIGH (ref 1.80–8.10)
Neutrophils: 81.6 %
Nucleated RBC: 0 /100 WBC (ref 0.0–1.0)
Platelets: 111 10*3/uL — ABNORMAL LOW (ref 140–400)
RBC: 3.39 10*6/uL — ABNORMAL LOW (ref 4.70–6.00)
RDW: 17 % — ABNORMAL HIGH (ref 12–15)
WBC: 12.49 10*3/uL — ABNORMAL HIGH (ref 3.50–10.80)

## 2017-01-23 LAB — BASIC METABOLIC PANEL
Anion Gap: 8 (ref 5.0–15.0)
BUN: 20 mg/dL (ref 9.0–28.0)
CO2: 26 mEq/L (ref 22–29)
Calcium: 8.5 mg/dL (ref 7.9–10.2)
Chloride: 104 mEq/L (ref 100–111)
Creatinine: 0.7 mg/dL (ref 0.7–1.3)
Glucose: 115 mg/dL — ABNORMAL HIGH (ref 70–100)
Potassium: 3.7 mEq/L (ref 3.5–5.1)
Sodium: 138 mEq/L (ref 136–145)

## 2017-01-23 LAB — GLUCOSE WHOLE BLOOD - POCT
Whole Blood Glucose POCT: 107 mg/dL — ABNORMAL HIGH (ref 70–100)
Whole Blood Glucose POCT: 113 mg/dL — ABNORMAL HIGH (ref 70–100)
Whole Blood Glucose POCT: 117 mg/dL — ABNORMAL HIGH (ref 70–100)

## 2017-01-23 LAB — GFR: EGFR: >60

## 2017-01-23 LAB — HEMOLYSIS INDEX: Hemolysis Index: 11 (ref 0–18)

## 2017-01-23 NOTE — Progress Notes (Signed)
INTERNAL MEDICINE PROGRESS NOTE    Progress Note - [name]    Date Time: 01/23/17 10:59 AM  Patient Name: 83 y.o. male with Altered mental status, unspecified altered mental status type      Assessment :    Gram Positive Rod Bacteremia/Identification is pending/ sent out    Repeat blood culture with no growth to date   Urine culture-MDR Proteus mirabilis-Status post 5 days course of treatment (gentamicin and Zosyn/ Urine culture/Repeat non significant bacteria   Leukocytosis is trending down   Procalcitoninis trending down   Pneumoniawith Bibasilar consolidation( sputum culture with Serrata &Pseudomonas)/on zosyn   Leukocytosis   Malfunction G-tube/Replaced   Metabolic Encephalopathy/Resolving   Acute on chronic respiratory failure - trached and ventilated   Cardiomyopathy with last ejection fraction at 40 percent   Hypertension   Chronic Atrial Fibrillation on AC      Plan:    D/C Amlodipine for low Blood pressure   Completed Zosyn today   Increase bumex to 1 mg bid    LeucoCytosis is trending down   CXR results noted   Increase metoprolo due to RVR   NS bolus PRN for low blood pressure   Follow final  blood cultures result and tailor antibiotics accordingly   Leukocytosis noted/ No fever   Pro calcitonin Level normalised    BNP at 173 noted   Monitor electrolytes and renal function   Supportive therapy with vent support and enteral feeding.    GI prophylaxis   DVT prophylaxis.   Discussed plan of care with nurses.   Discussed case with consultants.   Called his daughter vanessa at [phone] and discussed about his illness but concerned that he still is not at his baseline    Subjective:   ICDB      Review of Systems:    ICDB      Physical Exam:     Vitals:    01/23/17 0807   BP:    Pulse:    Resp:    Temp:    SpO2: 99%       Intake/Output Summary (Last 24 hours) at 01/23/17 1059  Last data filed at 01/23/17 0400   Gross per 24 hour   Intake              340 ml    Output              700 ml   Net             -360 ml      General:awake and respnsive   Heent:pinkish conjunctiva, anicteric sclera, dry mucus membrane   Neck: tracheostomy in place   Cvs:S1 &S 2 well heard, regular rate and rhythm   Chest:decreased air entry at the base bilaterally   Abdomen:Soft, non-tender, active bowel sounds.   Quadriparesis noted      Meds Reviewed: Yes  Medications:     Current Facility-Administered Medications   Medication Dose Route Frequency   . albuterol-ipratropium  3 mL Nebulization Q6H   . amiodarone  200 mg Oral Daily   . atorvastatin  20 mg per G tube QHS   . balsam peru-castor oil (VENELEX)   Topical Q12H   . bumetanide  1 mg per G tube BID   . famotidine  20 mg Oral QHS   . glycopyrrolate  1 mg per G tube Daily   . lactobacillus/streptococcus  1 capsule per G tube Daily   .  metoclopramide  5 mg per G tube Q8H   . metoprolol tartrate  50 mg per G tube BID   . QUEtiapine  25 mg Oral QHS   . rivaroxaban  20 mg per G tube Daily with dinner   . scopolamine  1 patch Transdermal Q72H       PRN Medication:  acetaminophen **OR** acetaminophen, naloxone, ondansetron **OR** ondansetron, oxyCODONE-acetaminophen, senna, sodium chloride      Labs:     Results     Procedure Component Value Units Date/Time    Glucose Whole Blood - POCT [[id number]]  (Abnormal) Collected:  01/23/17 0659     Updated:  01/23/17 0743     POCT - Glucose Whole blood 107 (H) mg/dL     Hemolysis index [[id number]] Collected:  01/23/17 0428     Updated:  01/23/17 0551     Hemolysis Index 11    GFR [[id number]] Collected:  01/23/17 0428     Updated:  01/23/17 0551     EGFR >60.0    Basic Metabolic Panel [[id number]]  (Abnormal) Collected:  01/23/17 0428    Specimen:  Blood Updated:  01/23/17 0551     Glucose 115 (H) mg/dL      BUN 62.9 mg/dL      Creatinine 0.7 mg/dL      Calcium 8.5 mg/dL      Sodium 528 mEq/L      Potassium 3.7 mEq/L      Chloride 104 mEq/L      CO2 26 mEq/L      Anion Gap 8.0    CBC and  differential [[id number]]  (Abnormal) Collected:  01/23/17 0428    Specimen:  Blood from Blood Updated:  01/23/17 0528     WBC 12.49 (H) x10 3/uL      Hgb 8.9 (L) g/dL      Hematocrit 27.2 (L) %      Platelets 111 (L) x10 3/uL      RBC 3.39 (L) x10 6/uL      MCV 83.5 fL      MCH 26.3 (L) pg      MCHC 31.4 (L) g/dL      RDW 17 (H) %      MPV 14.0 (H) fL      Neutrophils 81.6 %      Lymphocytes Automated 8.3 %      Monocytes 7.6 %      Eosinophils Automated 1.2 %      Basophils Automated 0.2 %      Immature Granulocyte 1.1 %      Nucleated RBC 0.0 /100 WBC      Neutrophils Absolute 10.19 (H) x10 3/uL      Abs Lymph Automated 1.04 x10 3/uL      Abs Mono Automated 0.95 x10 3/uL      Abs Eos Automated 0.15 x10 3/uL      Absolute Baso Automated 0.02 x10 3/uL      Absolute Immature Granulocyte 0.14 (H) x10 3/uL      Absolute NRBC 0.00 x10 3/uL     CULTURE BLOOD AEROBIC AND ANAEROBIC [[id number]] Collected:  01/20/17 2115    Specimen:  Blood, Venipuncture Updated:  01/23/17 0421    Narrative:       ORDER#: [id number]                                    ORDERED BY:  BEYDOUN, KAREN  SOURCE: Blood, Venipuncture arm                      COLLECTED:  01/20/17 21:15  ANTIBIOTICS AT COLL.:                                RECEIVED :  01/21/17 03:50  Culture Blood Aerobic and Anaerobic        PRELIM      01/23/17 04:21  01/22/17   No Growth after 1 day/s of incubation.  01/23/17   No Growth after 2 day/s of incubation.      Glucose Whole Blood - POCT [[id number]]  (Abnormal) Collected:  01/22/17 1131     Updated:  01/22/17 1212     POCT - Glucose Whole blood 124 (H) mg/dL             @MICROIP @    IRadiology:   Radiological Procedure reviewed.      Hershal Coria, MD  Spectra link [id number]  [facility] clinic  [phone]  01/23/2017  10:59 AM

## 2017-01-23 NOTE — Plan of Care (Signed)
Problem: Safety  Goal: Patient will be free from injury during hospitalization  Outcome: Progressing  Patient is Alert, on vent, trached, peged fio2 30%, lungs sounds rhonci bilaterally, pulses palpable, Afib on the monitor, VS stable, on continuous TF(promote @ 60cc/hr), scopolamine patch behind right ear, BM x 2, incontinent of urine, stage 2 to the sacral, venelex to sacrum. Patient is on IV zosyn

## 2017-01-23 NOTE — Plan of Care (Signed)
Problem: Compromised Tissue integrity  Goal: Damaged tissue is healing and protected   01/19/17 1834   Goal/Interventions addressed this shift   Damaged tissue is healing and protected  Monitor/assess Braden scale every shift;Reposition patient every 2 hours and as needed unless able to reposition self;Increase activity as tolerated/progressive mobility;Relieve pressure to bony prominences for patients at moderate and high risk;Avoid shearing injuries;Keep intact skin clean and dry   Patient is repositioned every 2 hours, skin kept clean and dry, bony prominences supported with pillows to maintain skin integrity. Will continue to monitor.

## 2017-01-23 NOTE — Progress Notes (Signed)
Infectious Diseases & Tropical Medicine  Progress Note    01/23/2017   [name] VWU:[id number] is a 83 y.o. male,       Assessment:      Sepsis.   Gram-positive rods bacteremia (2/2)-identification still pending [facility]   Altered mental status improved   Chest x-ray-worsening pulmonary edema/moderate bilateral pleural effusions (01/21/2017)   Sputum culture- Serratia marcescens and Pseudomonas   Pro-calcitonin level-0.1(01/21/2017)   Urine culture-MDR Proteus mirabilis-Status post 5 days course of treatment (gentamicin and Zosyn).   Repeat urine culture no growth.   Repeat blood culture no growth to date   Leukocytosis    History of quadriparesis secondary to neck fracture.   Status post tracheostomy and G-tube placement.   Resident of [facility] nursing [facility]    Plan:      Discontinue antibiotics   Continue probiotics.   Monitor electrolytes and renal functions closely.   Monitor clinically .   Discussed with pharmacy.    ROS:     General: No fever, no chills, no rigor,appears very comfortable, wants to go back to [facility]  Respiratory: no cough, shortness of breath, or wheezing   Gastrointestinal: no diarrhea  Genito-Urinary: no hematuria  Musculoskeletal: no edema  Neurological: Awake  Dermatological: no rash    Physical Examination:     Blood pressure 123/60, pulse (!) 110, temperature 98 F (36.7 C), temperature source Oral, resp. rate 18, height 1.854 m (6' 0.99"), weight 103.9 kg (229 lb), SpO2 99 %.     General Appearance: Comfortable, and in no acute distress.   HEENT: Pupils are equal, round, and reactive to light.    Lungs: Decreased breath sounds   Heart: Tachycardia   Chest: Symmetric chest wall expansion.    Abdomen: soft ,non tender,no hepatosplenomegaly,good bowel sounds   Neurological: Paraplegia   Extremities: No edema,water filled blisters over the glans penis resolved    Laboratory And Diagnostic Studies:     Recent Labs       01/23/17   0428  01/21/17   0345   WBC  12.49*  11.26*   Hgb  8.9*  8.7*   Hematocrit  28.3*  27.9*   Platelets  111*  96*     Recent Labs      01/23/17   0428  01/21/17   0345   Sodium  138  139   Potassium  3.7  4.0   Chloride  104  104   CO2  26  27   BUN  20.0  26.0   Creatinine  0.7  0.7   Glucose  115*  108*   Calcium  8.5  8.2     No results for input(s): AST, ALT, ALKPHOS, PROT, ALB in the last 72 hours.    Current Meds:      Scheduled Meds: PRN Meds:        albuterol-ipratropium 3 mL Nebulization Q6H   amiodarone 200 mg Oral Daily   atorvastatin 20 mg per G tube QHS   balsam peru-castor oil (VENELEX)  Topical Q12H   bumetanide 1 mg per G tube BID   famotidine 20 mg Oral QHS   glycopyrrolate 1 mg per G tube Daily   lactobacillus/streptococcus 1 capsule per G tube Daily   metoclopramide 5 mg per G tube Q8H   metoprolol tartrate 50 mg per G tube BID   piperacillin-tazobactam 4.5 g Intravenous Q8H SCH   QUEtiapine 25 mg Oral QHS  rivaroxaban 20 mg per G tube Daily with dinner   scopolamine 1 patch Transdermal Q72H       Continuous Infusions:     acetaminophen 650 mg Q4H PRN   Or     acetaminophen 650 mg Q4H PRN   naloxone 0.2 mg PRN   ondansetron 4 mg Q6H PRN   Or     ondansetron 4 mg Q6H PRN   oxyCODONE-acetaminophen 1 tablet Q6H PRN   senna 17.2 mg QD PRN   sodium chloride 200 mL Q12H PRN         [name] A. [name], M.D.  01/23/2017  9:11 AM

## 2017-01-23 NOTE — Progress Notes (Signed)
Pt can be discharged tomorrow at [facility] ; room 51A, report # [phone]; fax # [phone];  Ambulance requested for 11 am.  Charge nurse will contact the attending provider for discharge orders.    Garry Heater, RN MSN  Clinical Care Manager  [phone]

## 2017-01-23 NOTE — Progress Notes (Signed)
CM spoke w/ Jill Side from [place] and informed about discharge tomorrow before noon.     Garry Heater, RN MSN  Clinical Care Manager  [phone]

## 2017-01-23 NOTE — UM Notes (Addendum)
Primary Payor: MEDICARE/MEDICARE PART A AND B   Secondary Payor: ANTHEM/ANTHEM INDEMNITY  Concurrent 01/24/16    Torrie Mayers  83 y.o. male with Altered mental status    BP 123/68   Pulse (!) 110   Temp 98.9 F (37.2 C) (Oral)   Resp (!) 30   Ht 1.854 m (6' 0.99")   Wt 103.9 kg (229 lb)   SpO2 100%   BMI 30.22 kg/m     Temp:  [97.9 F (36.6 C)-98.9 F (37.2 C)]   Heart Rate:  [99-110]   Resp Rate:  [18-30]   BP: (115-146)/(60-74)   SpO2:  [98 %-100 %]     Lab Results last 48 Hours     Procedure Component Value Units Date/Time    Glucose Whole Blood - POCT [[id number]]  (Abnormal) Collected:  01/23/17 1125     Updated:  01/23/17 1157     POCT - Glucose Whole blood 113 (H) mg/dL     Glucose Whole Blood - POCT [[id number]]  (Abnormal) Collected:  01/23/17 0659     Updated:  01/23/17 0743     POCT - Glucose Whole blood 107 (H) mg/dL     Basic Metabolic Panel [[id number]]  (Abnormal) Collected:  01/23/17 0428    Specimen:  Blood Updated:  01/23/17 0551     Glucose 115 (H) mg/dL     CBC and differential [[id number]]  (Abnormal) Collected:  01/23/17 0428    Specimen:  Blood from Blood Updated:  01/23/17 0528     WBC 12.49 (H) x10 3/uL      Hgb 8.9 (L) g/dL      Hematocrit 62.9 (L) %      Platelets 111 (L) x10 3/uL      RBC 3.39 (L) x10 6/uL      MCH 26.3 (L) pg      MCHC 31.4 (L) g/dL      RDW 17 (H) %      MPV 14.0 (H) fL      Neutrophils Absolute 10.19 (H) x10 3/uL      Absolute Immature Granulocyte 0.14 (H) x10 3/uL     Glucose Whole Blood - POCT [[id number]]  (Abnormal) Collected:  01/22/17 1131     Updated:  01/22/17 1212     POCT - Glucose Whole blood 124 (H) mg/dL         MD note 0/10/27  Assessment :  Gram Positive Rod Bacteremia/Identification is pending/ sent out   Repeat blood culture with no growth to date  Urine culture-MDR Proteus mirabilis-Status post 5 days course of treatment (gentamicin and Zosyn/ Urine culture/Repeat nonsignificant bacteria  Leukocytosis is trending  down  Procalcitoninis trending down  Pneumoniawith Bibasilar consolidation( sputum culture with Serrata &Pseudomonas)/on zosyn  Leukocytosis  Malfunction G-tube/Replaced  Metabolic Encephalopathy/Resolving  Acute on chronic respiratory failure - trached and ventilated  Cardiomyopathy with last ejection fraction at 40 percent  Hypertension  Chronic Atrial Fibrillation on AC  Plan:  D/C Amlodipine for low Blood pressure  Completed Zosyn today  Increase bumex to 1 mg bid   LeucoCytosis is trending down  CXR results noted  Increase metoprolo due to RVR  NS bolus PRN for low blood pressure  Follow final blood cultures resultand tailor antibiotics accordingly  Leukocytosis noted/ No fever  Pro calcitonin Level normalised  BNP at 173 noted  Monitor electrolytes and renal function  Supportive therapy with vent support and enteral feeding.   GI prophylaxis  DVT prophylaxis.  Discussed plan  of care with nurses.  Discussed case with consultants.    Scheduled Meds:  Current Facility-Administered Medications   Medication Dose Route Frequency   . albuterol-ipratropium  3 mL Nebulization Q6H   . amiodarone  200 mg Oral Daily   . atorvastatin  20 mg per G tube QHS   . balsam peru-castor oil (VENELEX)   Topical Q12H   . bumetanide  1 mg per G tube BID   . famotidine  20 mg Oral QHS   . glycopyrrolate  1 mg per G tube Daily   . lactobacillus/streptococcus  1 capsule per G tube Daily   . metoclopramide  5 mg per G tube Q8H   . metoprolol tartrate  50 mg per G tube BID   . QUEtiapine  25 mg Oral QHS   . rivaroxaban  20 mg per G tube Daily with dinner   . scopolamine  1 patch Transdermal Q72H     Continuous Infusions:  PRN Meds:.acetaminophen, naloxone, ondansetron, oxycodone, senna, sodium chloride    DCP discharged tomorrow at [facility]  Ambulance requested for 11 am.    Eddie North,  BSN, RN  Utilization Review Case Manager  Case Management Department  [facility]  T [phone] Judie Petit  [phone]   [email or web address]  Please submit all clinical review request via fax to [phone]

## 2017-01-24 ENCOUNTER — Inpatient Hospital Stay: Payer: Medicare Other

## 2017-01-24 LAB — GLUCOSE WHOLE BLOOD - POCT
Whole Blood Glucose POCT: 134 mg/dL — ABNORMAL HIGH (ref 70–100)
Whole Blood Glucose POCT: 129 mg/dL — ABNORMAL HIGH (ref 70–100)

## 2017-01-24 MED ORDER — PROMETHAZINE HCL 25 MG/ML IJ SOLN
6.2500 mg | Freq: Four times a day (QID) | INTRAMUSCULAR | Status: DC | PRN
Start: 2017-01-24 — End: 2017-01-30
  Administered 2017-01-24: 6.25 mg via INTRAVENOUS
  Filled 2017-01-24: qty 1

## 2017-01-24 MED ORDER — DEXTROSE-SODIUM CHLORIDE 5-0.9 % IV SOLN
INTRAVENOUS | Status: DC
Start: 2017-01-24 — End: 2017-01-28

## 2017-01-24 MED ORDER — DILTIAZEM HCL 5 MG/ML IV SOLN (WRAP)
5.0000 mg/h | INTRAVENOUS | Status: DC
Start: 2017-01-24 — End: 2017-01-28
  Administered 2017-01-24 – 2017-01-28 (×6): 5 mg/h via INTRAVENOUS
  Filled 2017-01-24 (×6): qty 25

## 2017-01-24 NOTE — Plan of Care (Addendum)
0800 - Patient vomitted ~486ml tan yellow stomach fluid.   5956 - Patient vomited ~458ml tan yellow stomach fluid again. Patient given Iv Zofran cleaned and changed. Patient states he is feeling better.    54 -RN Paged Dr Patrcia Dolly in regards to patients change in condition, and to consult weather or not patient will be [place]'d today still to [place].  0930 - No response from Dr. Patrcia Dolly. RN paged Dr. [name]  No response.    1000 - Dr. Patrcia Dolly at bedside to assess patient. States will hold Drew for today, to check and ensure patient is not retaining TF. Then if all goes well over night, possible [place] tomorrow. States hold TF for 4 hours then re-start

## 2017-01-24 NOTE — Progress Notes (Signed)
Despite finding 0 residual twice earlier in the day, @ 1400 - RN Found patient to have residual > 2,000 ml.  Called Dr. Benay Pike obtained order for KUB. MD stated make patient NPO, Hold TF until further notice. Stated it is ok to discard stomach contents and not re-infuse after finding extremely large residual due to high risk of increased aspiration.    1700 - called Dr. Benay Pike again due to patients elevated HR Afib RVR 125-130. Ordered Cardizem bolus

## 2017-01-24 NOTE — Progress Notes (Signed)
Infect[facility] Diseases [facility]  Progress Note    01/24/2017   [name] ZOX:[id number],XBJ:[id number] is a 83 y.o. male,       Assessment:      Sepsis resolved   Abdominal x-ray-mechanical obstruction/ileus   Altered mental status improved   Chest x-ray-stable (01/24/2017)   Sputum culture- Serratia marcescens and Pseudomonas   Pro-calcitonin level-0.1(01/21/2017)   Urine culture-MDR Proteus mirabilis-Status post 5 days course of treatment (gentamicin and Zosyn).   Repeat urine culture no growth.   Repeat blood culture no growth to date   Gram-positive rods bacteremia (2/2)-identification still pending [facility]   Leukocytosis    History of quadriparesis secondary to neck fracture.   Status post tracheostomy and G-tube placement.   Resident of [facility]    Plan:      Continue monitoring without antibiotics   Continue probiotics.   Monitor electrolytes and renal functions closely.   Monitor clinically .   Discussed with pharmacy.    ROS:     General: No fever, no chills, no rigor,comfortable  Respiratory: no cough, shortness of breath, or wheezing   Gastrointestinal: no diarrhea, patient had vomiting last night  Genito-Urinary: no hematuria  Musculoskeletal: no edema  Neurological: Awake  Dermatological: no rash    Physical Examination:     Blood pressure 109/60, pulse (!) 129, temperature 98.1 F (36.7 C), temperature source Axillary, resp. rate 18, height 1.854 m (6' 0.99"), weight 103.9 kg (229 lb), SpO2 100 %.     General Appearance: Comfortable, and in no acute distress.   HEENT: Pupils are equal, round, and reactive to light.    Lungs: Decreased breath sounds   Heart: Tachycardia   Chest: Symmetric chest wall expansion.    Abdomen: Slightly distended ,non tender,no hepatosplenomegaly,good bowel sounds   Neurological: Paraplegia   Extremities: No edema    Laboratory And Diagnostic Studies:     Recent Labs      01/23/17   0428   WBC  12.49*   Hgb   8.9*   Hematocrit  28.3*   Platelets  111*     Recent Labs      01/23/17   0428   Sodium  138   Potassium  3.7   Chloride  104   CO2  26   BUN  20.0   Creatinine  0.7   Glucose  115*   Calcium  8.5     No results for input(s): AST, ALT, ALKPHOS, PROT, ALB in the last 72 hours.    Current Meds:      Scheduled Meds: PRN Meds:        albuterol-ipratropium 3 mL Nebulization Q6H   amiodarone 200 mg Oral Daily   atorvastatin 20 mg per G tube QHS   balsam peru-castor oil (VENELEX)  Topical Q12H   bumetanide 1 mg per G tube BID   famotidine 20 mg Oral QHS   glycopyrrolate 1 mg per G tube Daily   lactobacillus/streptococcus 1 capsule per G tube Daily   metoclopramide 5 mg per G tube Q8H   metoprolol tartrate 50 mg per G tube BID   QUEtiapine 25 mg Oral QHS   rivaroxaban 20 mg per G tube Daily with dinner   scopolamine 1 patch Transdermal Q72H       Continuous Infusions:     acetaminophen 650 mg Q4H PRN   Or     acetaminophen 650 mg Q4H PRN   naloxone 0.2 mg  PRN   ondansetron 4 mg Q6H PRN   Or     ondansetron 4 mg Q6H PRN   oxyCODONE-acetaminophen 1 tablet Q6H PRN   senna 17.2 mg QD PRN   sodium chloride 200 mL Q12H PRN         [name], M.D.  01/24/2017  9:35 AM

## 2017-01-24 NOTE — Progress Notes (Signed)
[facility]  INTERNAL MEDICINE PROGRESS NOTE    Date Time: 01/24/17 11:27 PM  Patient Name: [name]      Problem List:      Bowel Obstruction vs. Ileus   Nausea and Vomiting   Repeat blood culture with no growth to date   Urine culture-MDR Proteus mirabilis-Status post 5 days course of treatment (gentamicin and Zosyn/ Urine culture/Repeat nonsignificant bacteria   Pneumoniawith Bibasilar consolidation( sputum culture with Serrata &Pseudomonas)   Leukocytosis   Malfunction G-tube/Replaced   Metabolic Encephalopathy   Acute on Chronic Respiratory Failure    Cardiomyopathy with last EF @40  percent   Hypertension   Chronic Atrial Fibrillation on AC     Plan:      Hold Tube Feeding due to obstruction symptoms   G tube to intermittent suction   KUB showed obstruction Vs. Ileus   Start IVF hydration   Start Cardizem as is heart rate went up AF/RVR   GI prophylaxis   DVT prophylaxis   Discussed plan of care with patient   Discussed plan of care with nurses.   Discharge will be on hold.     Subjective:     [name] had bouts of vomtinig today . His abdomen was distended. His KUB showed ileus vs. Obstruction.   He is alert and smiling.       Medications:   Medications:   Scheduled Meds: PRN Meds:        albuterol-ipratropium 3 mL Nebulization Q6H   amiodarone 200 mg Oral Daily   atorvastatin 20 mg per G tube QHS   balsam peru-castor oil (VENELEX)  Topical Q12H   bumetanide 1 mg per G tube BID   famotidine 20 mg Oral QHS   glycopyrrolate 1 mg per G tube Daily   lactobacillus/streptococcus 1 capsule per G tube Daily   metoclopramide 5 mg per G tube Q8H   metoprolol tartrate 50 mg per G tube BID   QUEtiapine 25 mg Oral QHS   rivaroxaban 20 mg per G tube Daily with dinner   scopolamine 1 patch Transdermal Q72H         Continuous Infusions:   . dextrose 5 % and 0.9% NaCl 75 mL/hr at 01/24/17 2058   . dilTIAZem 5 mg/hr (01/24/17 1829)          acetaminophen 650 mg Q4H PRN   Or      acetaminophen 650 mg Q4H PRN   naloxone 0.2 mg PRN   ondansetron 4 mg Q6H PRN   Or     ondansetron 4 mg Q6H PRN   oxyCODONE-acetaminophen 1 tablet Q6H PRN   promethazine 6.25 mg Q6H PRN   senna 17.2 mg QD PRN   sodium chloride 200 mL Q12H PRN           Physical Exam:     VITAL SIGNS   Temp:  [97.6 F (36.4 C)-99.1 F (37.3 C)] 99.1 F (37.3 C)  Heart Rate:  [107-135] 107  Resp Rate:  [18-32] 18  BP: (107-136)/(44-90) 133/83  FiO2:  [30 %] 30 %  POCT Glucose Result (Read Only)  Avg: 120.4  Min: 82  Max: 154  SpO2: 96 %    Intake/Output Summary (Last 24 hours) at 01/24/17 2327  Last data filed at 01/24/17 1000   Gross per 24 hour   Intake                0 ml   Output  800 ml   Net             -800 ml         General: Awake, alert, in no acute distress.   Heent: pinkish conjunctiva, anicteric sclera, moist mucus membrane   Neck : tracheostomy in situ and on vent    Cvs: S1 & S 2 well heard, regular rate and rhythm   Chest: Clear to auscultation   Abdomen: Distended, Hypertympanitic, non-tender, active bowel sounds.   Ext : no cyanosis,trace edema   CNS: Alert, follows command,    Laboratory Results:     CHEMISTRY:     Recent Labs  Lab 01/23/17  0428 01/21/17  0345 01/20/17  0359 01/18/17  1726   Glucose 115* 108* 89 111*   BUN 20.0 26.0 29.0* 26.0   Creatinine 0.7 0.7 0.7 0.7   Calcium 8.5 8.2 8.2 8.1   Sodium 138 139 138 141   Potassium 3.7 4.0 3.6 3.7   Chloride 104 104 102 103   CO2 26 27 28 29        HEMATOLOGY:    Recent Labs  Lab 01/23/17  0428 01/21/17  0345 01/20/17  0359 01/19/17  1753 01/18/17  1726   WBC 12.49* 11.26* 12.82* 13.14* 18.27*   Hgb 8.9* 8.7* 8.4* 8.5* 8.8*   Hematocrit 28.3* 27.9* 26.8* 27.7* 28.4*   MCV 83.5 84.5 83.2 86.0 84.5   MCH 26.3* 26.4* 26.1* 26.4* 26.2*   MCHC 31.4* 31.2* 31.3* 30.7* 31.0*   Platelets 111* 96* 93* 256 211       MICROBIOLOGY:  Microbiology Results     Procedure Component Value Units Date/Time    Bacterial ID & Sens Non-Urine Isolate [[id number]]  Collected:  01/08/17 1029     Updated:  01/12/17 1334    Narrative:       [id number] called Micro Results of_pos bld. Results read back [id number], by [id number]  on 01/10/2017 at 00:36  Source: Blood  Test code: 78469G  Submitted: BAP  Send isolate slant    Blood Culture Aerobic/Anaerobic #1 [[id number]] Collected:  01/08/17 1029    Specimen:  Arm from Blood Updated:  01/13/17 1338    Narrative:       [id number] called Micro Results of_pos bld. Results read back [id number], by [id number]  on 01/10/2017 at 00:36  Source: Blood  Test code: 44034V  Submitted: BAP  ORDER#: [id number]                                    ORDERED BY: Lehman Prom  SOURCE: Blood arm                                    COLLECTED:  01/08/17 10:29  ANTIBIOTICS AT COLL.:                                RECEIVED :  01/08/17 15:23  [id number] called Micro Results of_pos bld. Results read back [id number], by [id number] on 01/10/2017 at 00:36  Culture Blood Aerobic and Anaerobic        PRELIM      01/13/17 13:38   +  01/09/17   No Growth after 1 day/s of incubation.  01/10/17   Anaerobic Blood Culture Positive in 24 to 48  hours             Gram Stain Shows: Gram positive rods             Identification to follow  01/12/17   Aerobic Blood Culture Positive after 4 days             Gram Stain Shows: Gram positive rods             Identification to follow  01/11/17   Growth of Gram-positive rod               Identification to follow             Isolate sent to reference lab        Blood Culture Aerobic/Anaerobic #2 [[id number]] Collected:  01/08/17 1029    Specimen:  Arm from Blood Updated:  01/13/17 1552    Narrative:       [id number] called Micro Results of_pos bld. Results read back [id number], by [id number]  on 01/09/2017 at 21:45  ORDER#: [id number]                                    ORDERED BY: Lehman Prom  SOURCE: Blood arm                                    COLLECTED:  01/08/17 10:29  ANTIBIOTICS AT COLL.:                                RECEIVED :  01/08/17 15:23  [id number]_ called Micro  Results of_pos bld. Results read back [id number], by [id number] on 01/09/2017 at 21:45  Culture Blood Aerobic and Anaerobic        PRELIM      01/13/17 15:52   +  01/09/17   Anaerobic Blood Culture Positive in 24 to 48 hours             Gram Stain Shows: Gram positive rods  01/11/17   Aerobic culture no growth to date, final report to follow  01/13/17   Aerobic Blood Culture No Growth  01/11/17   Growth of Gram-positive rod               Identification to follow        CULTURE BLOOD AEROBIC AND ANAEROBIC [[id number]] Collected:  01/20/17 2115    Specimen:  Blood, Venipuncture Updated:  01/24/17 0421    Narrative:       ORDER#: [id number]                                    ORDERED BY: Gearldine Shown  SOURCE: Blood, Venipuncture arm                      COLLECTED:  01/20/17 21:15  ANTIBIOTICS AT COLL.:                                RECEIVED :  01/21/17 03:50  Culture Blood Aerobic and Anaerobic        PRELIM      01/24/17 04:21  01/22/17   No Growth after 1 day/s  of incubation.  01/23/17   No Growth after 2 day/s of incubation.  01/24/17   No Growth after 3 day/s of incubation.      CULTURE BLOOD AEROBIC AND ANAEROBIC [[id number]] Collected:  01/13/17 1646    Specimen:  Blood, Venipuncture Updated:  01/18/17 2221    Narrative:       ORDER#: [id number]                                    ORDERED BY: CHUTUAPE, ARTHU  SOURCE: Blood, Venipuncture peripheral               COLLECTED:  01/13/17 16:46  ANTIBIOTICS AT COLL.:                                RECEIVED :  01/13/17 20:15  Culture Blood Aerobic and Anaerobic        FINAL       01/18/17 22:21  01/18/17   No growth after 5 days of incubation.      MRSA Culture [[id number]] Collected:  01/08/17 1029    Specimen:  Body Fluid from Nasal/Throat ASC Admission Updated:  01/09/17 1653    Narrative:       ORDER#: [id number]                                    ORDERED BY: Lehman Prom  SOURCE: Nares and Throat                             COLLECTED:  01/08/17 10:29  ANTIBIOTICS AT COLL.:                                 RECEIVED :  01/08/17 15:48  Culture MRSA Surveillance                  FINAL       01/09/17 16:53  01/09/17   Negative for Methicillin Resistant Staph aureus from Nares and             Negative for Methicillin Resistant Staph aureus from Throat      MRSA culture [[id number]] Collected:  01/09/17 0423    Specimen:  Body Fluid from Nasal/Throat ASC Admission Updated:  01/11/17 0950    Narrative:       ORDER#: [id number]                                    ORDERED BY: AL KHOURI, SAMI  SOURCE: Nares and Throat                             COLLECTED:  01/09/17 04:23  ANTIBIOTICS AT COLL.:                                RECEIVED :  01/09/17 10:27  Culture MRSA Surveillance                  FINAL  01/11/17 09:50   +  01/11/17   Positive for Methicillin Resistant Staph aureus from Nares             Negative for Methicillin Resistant Staph aureus from Throat      Sputum Culture [[id number]] Collected:  01/08/17 1129    Specimen:  Sputum from Tracheal Aspirate Updated:  01/11/17 1448    Narrative:       ORDER#: [id number]                                    ORDERED BY: Lehman Prom  SOURCE: Tracheal Aspirate trach                      COLLECTED:  01/08/17 11:29  ANTIBIOTICS AT COLL.:                                RECEIVED :  01/08/17 16:01  Stain, Gram (Respiratory)                  FINAL       01/08/17 17:23  01/08/17   Few WBC's             Rare WBC's             Moderate Mixed Respiratory Flora  Culture and Gram Stain, Aerobic, RespiratorFINAL       01/11/17 14:48   +  01/09/17   Heavy growth of mixed upper respiratory flora  01/11/17   Heavy growth of Serratia marcescens               Morganella, Serratia, Providencia, Hafnia, Citrobacter, and             Enterobacter species may develop resistance while on therapy             with B-lactam antibiotics, even if initially susceptible, due             to derepression of AmpC B-lactamase. CLSI M100-S24,              System Antimicrobial  Subcommittee June 2015    01/11/17   Heavy growth of Pseudomonas aeruginosa      _____________________________________________________________________________                                  S.marcescens    P.aeruginosa    ANTIBIOTICS                     MIC  INTRP      MIC  INTRP      _____________________________________________________________________________  Amikacin                                        <=8    S        Amoxicillin/CA                 >16/8   R                        Ampicillin                      >  16    R                        Aztreonam                       <=2    S         4     S        Cefazolin                       >16    R                        Cefepime                        <=1    S         2     S        Ceftazidime                     <=2    S        <=2    S        Ceftriaxone                     <=1    S                        Ciprofloxacin                    2     I         2     I        Ertapenem                     <=0.25   S                        Gentamicin                      <=2    S        <=2    S        Levofloxacin                    <=1    S         4     I        Meropenem                     <=0.25   S         8     R        Piperacillin/Tazobactam        <=4/4   S        4/4    S        Tetracycline                    >8     R                        Tobramycin                                      <=  2    S        _____________________________________________________________________________            S=SUSCEPTIBLE     I=INTERMEDIATE     R=RESISTANT                            N/S=NON-SUSCEPTIBLE  _____________________________________________________________________________      Urine culture [[id number]] Collected:  01/19/17 1748    Specimen:  Urine from Urine, Catheterized, In & Out Updated:  01/20/17 1946    Narrative:       ORDER#: [id number]                                    ORDERED BY: Oval Linsey  SOURCE: Urine, Catheterized, In & Out                 COLLECTED:  01/19/17 17:48  ANTIBIOTICS AT COLL.:                                RECEIVED :  01/19/17 21:45  Culture Urine                              FINAL       01/20/17 19:46  01/20/17   No growth of >1,000 CFU/ML, No further work      Urine culture [[id number]] Collected:  01/08/17 1129    Specimen:  Urine from Urine, Catheterized, In & Out Updated:  01/10/17 1517    Narrative:       ORDER#: [id number]                                    ORDERED BY: Lehman Prom  SOURCE: Urine, Catheterized, In & Out                COLLECTED:  01/08/17 11:29  ANTIBIOTICS AT COLL.:                                RECEIVED :  01/08/17 15:48  Culture Urine                              FINAL       01/10/17 15:17   +  01/10/17   >100,000 CFU/ML MDR Proteus mirabilis               Cefazolin results predict results for the oral agents             cefuroxime axetil, cephalexin, cefdinir, cefpodoxime, and             cefprozil when used for therapy of uncomplicated UTIs due to             E.coli, K. pneumonia, and P. mirabilis. CLSI M100 S25             This multidrug resistant (MDR) Enterobacteriaceae is resistant             to ceftriaxone and may not respond optimally to B-lactam             antibiotics (excluding carbapenems).  [place] [facility] Antimicrobial Subcommittee June 2015    _____________________________________________________________________________                                MDR P.mirabilis   ANTIBIOTICS                     MIC  INTRP      _____________________________________________________________________________  Amikacin                        >32    R        Amoxicillin/CA                 <=4/2   S        Ampicillin                      >16    R        Aztreonam                       >16    R        Cefazolin                       >16    R        Cefepime                        >16    R        Ceftazidime                     >16    R        Ceftriaxone                     >32    R        Ciprofloxacin                    >2     R        Ertapenem                     <=0.25   S        Gentamicin                      >8     R        Levofloxacin                    >4     R        Nitrofurantoin                  >64    R  D1    Piperacillin/Tazobactam        <=2/4   S        Tetracycline                    >8     R        Tobramycin                      >8     R        Trimethoprim/Sulfamethoxazole  >2/38   R          -----  DRUG COMMENTS----------    D1:  Nitrofurantoin should only be used for the treatment of         uncomplicated cystitis.         [facility] Antimicrobial Subcommittee June 2015  _____________________________________________________________________________            S=SUSCEPTIBLE     I=INTERMEDIATE     R=RESISTANT                            N/S=NON-SUSCEPTIBLE  _____________________________________________________________________________              Radiology Results:   Radiological Procedure reviewed.  Ct Head Wo Contrast    Result Date: 01/08/2017    No acute intracranial abnormality. Wyatt Portela, MD 01/08/2017 11:16 AM    Xr Chest Ap Portable    Result Date: 01/24/2017   Stable chest Gaynelle Arabian, MD 01/24/2017 11:07 AM    Xr Chest Ap Portable    Result Date: 01/21/2017   Worsening pulmonary edema pattern with underlying worsening small to moderate bilateral pleural effusions Laurena Slimmer, MD 01/21/2017 8:00 AM    Xr Chest Ap Portable    Result Date: 01/19/2017  1. Stable small layering bilateral pleural effusions. 2. Consolidation throughout the retrocardiac region which may be secondary to layering pleural fluid, subsegmental atelectatic changes and/or infiltrates.  Fonnie Mu, MD 01/19/2017 12:57 PM    Xr Chest Ap Portable    Result Date: 01/10/2017   Technically limited portable study. Increased bilateral pleural effusions and bibasilar opacification. Heron Nay, MD 01/10/2017 8:01 AM    Xr Chest  Ap Portable    Result Date: 01/08/2017    Interval improvement of the vascular congestion and  pulmonary edema and decrease in size of the pleural effusions. Heron Nay, MD 01/08/2017 11:29 AM    Xr Abdomen Portable    Result Date: 01/24/2017   Multiple dilated small bowel loops consistent with mechanical obstruction or ileus. Gustavus Messing, MD 01/24/2017 5:10 PM    G,j,g/j Tube Replacement    Result Date: 01/10/2017    Sinogram of the old gastrostomy tube tract allowing recanalization and redilation with eventual replacement of 20 French MIC gastrostomy catheter in good position the stomach. The new catheter may be used immediately. Dara Lords, MD 01/10/2017 11:05 AM      Signed by: Ledora Bottcher M.D.  Spectra Link: [id number]  Office Phone Number : [phone]

## 2017-01-24 NOTE — Progress Notes (Deleted)
Rn paged Dr. Roseanne Reno, [name]  "PT: [address] [phone] patient really wants to be discharged today.  Remains stable. Can we Discharge him? Only thing of note, he remains on Heparin gtt for admiting diagnosis of new onset Afib. Was converted SR. No oral thinner"

## 2017-01-24 NOTE — Progress Notes (Signed)
Order for NG tube but unable to place, pt denying and not cooperating. Dr. [name] notified, to hold on placing tube, will discuss tomorrow morning. Ok to give meds via PEG tube.

## 2017-01-24 NOTE — Progress Notes (Signed)
1200 - Patient dry heaving stating is nauseous. Unable to administer additional dose of PRN Zofran at this time.  1210 - RN Called Dr Benay Pike on spectral link [phone] - no response.  1210 -RN Called call service #[phone]    1250 - Still no response. Rn [employer] again. No response. RN Left additional message with call service #[phone]

## 2017-01-24 NOTE — Plan of Care (Signed)
Problem: Pain  Goal: Pain at adequate level as identified by patient  Outcome: Progressing   01/24/17 1236   Goal/Interventions addressed this shift   Pain at adequate level as identified by patient Identify patient comfort function goal;Assess for risk of opioid induced respiratory depression, including snoring/sleep apnea. Alert healthcare team of risk factors identified.;Assess pain on admission, during daily assessment and/or before any "as needed" intervention(s);Reassess pain within 30-60 minutes of any procedure/intervention, per Pain Assessment, Intervention, Reassessment (AIR) Cycle;Evaluate if patient comfort function goal is met;Evaluate patient's satisfaction with pain management progress;Offer non-pharmacological pain management interventions;Consult/collaborate with Pain Service;Consult/collaborate with Physical Therapy, Occupational Therapy, and/or Speech Therapy;Include patient/patient care companion in decisions related to pain management as needed       Problem: Artificial Airway  Goal: Tracheostomy will be maintained  Outcome: Progressing

## 2017-01-24 NOTE — Progress Notes (Signed)
Nausea/ vomiting and G-tube w/ high residual.  HR 110-120, on Cardizem gtt.  Not ready for discharge at this time.    Garry Heater, RN MSN  Clinical Care Manager  [phone]

## 2017-01-25 ENCOUNTER — Inpatient Hospital Stay: Payer: Medicare Other

## 2017-01-25 LAB — CBC AND DIFFERENTIAL
Absolute NRBC: 0 10*3/uL
Basophils Absolute Automated: 0.02 10*3/uL (ref 0.00–0.20)
Basophils Automated: 0.1 %
Eosinophils Absolute Automated: 0.02 10*3/uL (ref 0.00–0.70)
Eosinophils Automated: 0.1 %
Hematocrit: 35.1 % — ABNORMAL LOW (ref 42.0–52.0)
Hgb: 11 g/dL — ABNORMAL LOW (ref 13.0–17.0)
Immature Granulocytes Absolute: 0.2 10*3/uL — ABNORMAL HIGH
Immature Granulocytes: 1 %
Lymphocytes Absolute Automated: 1.03 10*3/uL (ref 0.50–4.40)
Lymphocytes Automated: 5.3 %
MCH: 26 pg — ABNORMAL LOW (ref 28.0–32.0)
MCHC: 31.3 g/dL — ABNORMAL LOW (ref 32.0–36.0)
MCV: 83 fL (ref 80.0–100.0)
MPV: 13.4 fL — ABNORMAL HIGH (ref 9.4–12.3)
Monocytes Absolute Automated: 1.41 10*3/uL — ABNORMAL HIGH (ref 0.00–1.20)
Monocytes: 7.2 %
Neutrophils Absolute: 16.86 10*3/uL — ABNORMAL HIGH (ref 1.80–8.10)
Neutrophils: 86.3 %
Nucleated RBC: 0 /100 WBC (ref 0.0–1.0)
Platelets: 207 10*3/uL (ref 140–400)
RBC: 4.23 10*6/uL — ABNORMAL LOW (ref 4.70–6.00)
RDW: 18 % — ABNORMAL HIGH (ref 12–15)
WBC: 19.54 10*3/uL — ABNORMAL HIGH (ref 3.50–10.80)

## 2017-01-25 LAB — BASIC METABOLIC PANEL
Anion Gap: 13 (ref 5.0–15.0)
BUN: 37 mg/dL — ABNORMAL HIGH (ref 9.0–28.0)
CO2: 25 mEq/L (ref 22–29)
Calcium: 9.4 mg/dL (ref 7.9–10.2)
Chloride: 101 mEq/L (ref 100–111)
Creatinine: 1.3 mg/dL (ref 0.7–1.3)
Glucose: 142 mg/dL — ABNORMAL HIGH (ref 70–100)
Potassium: 4.3 mEq/L (ref 3.5–5.1)
Sodium: 139 mEq/L (ref 136–145)

## 2017-01-25 LAB — GLUCOSE WHOLE BLOOD - POCT
Whole Blood Glucose POCT: 113 mg/dL — ABNORMAL HIGH (ref 70–100)
Whole Blood Glucose POCT: 124 mg/dL — ABNORMAL HIGH (ref 70–100)

## 2017-01-25 LAB — HEMOLYSIS INDEX: Hemolysis Index: 27 — ABNORMAL HIGH (ref 0–18)

## 2017-01-25 LAB — GFR: EGFR: 52.9

## 2017-01-25 MED ORDER — SODIUM CHLORIDE 0.9 % IV MBP
4.5000 g | Freq: Three times a day (TID) | INTRAVENOUS | Status: DC
Start: 2017-01-25 — End: 2017-01-27
  Administered 2017-01-25 – 2017-01-27 (×7): 4.5 g via INTRAVENOUS
  Filled 2017-01-25: qty 20
  Filled 2017-01-25 (×3): qty 100
  Filled 2017-01-25 (×4): qty 20
  Filled 2017-01-25 (×3): qty 100
  Filled 2017-01-25: qty 20
  Filled 2017-01-25: qty 100
  Filled 2017-01-25: qty 20

## 2017-01-25 MED ORDER — LORAZEPAM 2 MG/ML IJ SOLN
1.0000 mg | Freq: Once | INTRAMUSCULAR | Status: AC
Start: 2017-01-25 — End: 2017-01-25
  Administered 2017-01-25: 1 mg via INTRAVENOUS
  Filled 2017-01-25: qty 1

## 2017-01-25 MED ORDER — IODIXANOL 320 MG/ML IV SOLN
100.0000 mL | Freq: Once | INTRAVENOUS | Status: AC | PRN
Start: 2017-01-25 — End: 2017-01-26
  Administered 2017-01-26: 100 mL via INTRAVENOUS

## 2017-01-25 NOTE — Plan of Care (Signed)
Problem: Infection  Goal: Free from infection  Outcome: Not Progressing   01/25/17 1816   OTHER   Free from infection  Assess for signs/symptoms of infection;Utilize isolation precautions per protocol/policy;Assess immunization status;Consult/collaborate with Infection Preventionist     WBC slightly elevated. Re-started patient on zosyn    Problem: Compromised Hemodynamic Status  Goal: Vital signs and fluid balance maintained/improved  Outcome: Not Progressing   01/25/17 1816   Goal/Interventions addressed this shift   Vital signs and fluid balance are maintained/improved Position patient for maximum circulation/cardiac output;Monitor/assess vitals and hemodynamic parameters with position changes;Monitor and compare daily weight;Monitor intake and output. Notify LIP if urine output is less than 30 mL/hour.;Monitor/assess lab values and report abnormal values     Continues in elevated Afib 100 -120 on Cardizem gtt

## 2017-01-25 NOTE — Progress Notes (Signed)
[facility]  INTERNAL MEDICINE PROGRESS NOTE    Date Time: 01/25/17 5:56 AM  Patient Name: [name],[name]      Problem List:      Bowel Obstruction vs. Ileus   Nausea and Vomiting   Urine culture-MDR Proteus mirabilis-Status post 5 days course of treatment (gentamicin and Zosyn/ Urine culture/Repeat nonsignificant bacteria   Pneumoniawith Bibasilar consolidation( sputum culture with Serrata &Pseudomonas)   Leukocytosis   Malfunction G-tube/Replaced   Metabolic Encephalopathy   Acute on Chronic Respiratory Failure    Cardiomyopathy with last EF @40  percent   Hypertension   Chronic Atrial Fibrillation on AC     Plan:      Hold Tube Feeding due to obstruction symptoms   NGT placement and intermittent suction   Will obtain CT abdomen and pelvis   Will ask surgery consult with Dr. Elmon Else   ID recommended to start with IV antibiotics   Start IVF hydration   Start Cardizem as is heart rate went up AF/RVR   GI prophylaxis   DVT prophylaxis   Discussed plan of care with patient   Discussed plan of care with nurses.   Discharge will be on hold.     Subjective:     Durell had bouts of vomtinig today . His abdomen was distended. His KUB showed ileus vs. Obstruction.   He is awake. He is still distended and his stomach is full of residual. His TF was stopped.       Medications:   Medications:   Scheduled Meds: PRN Meds:        albuterol-ipratropium 3 mL Nebulization Q6H   amiodarone 200 mg Oral Daily   atorvastatin 20 mg per G tube QHS   balsam peru-castor oil (VENELEX)  Topical Q12H   bumetanide 1 mg per G tube BID   famotidine 20 mg Oral QHS   glycopyrrolate 1 mg per G tube Daily   lactobacillus/streptococcus 1 capsule per G tube Daily   metoclopramide 5 mg per G tube Q8H   metoprolol tartrate 50 mg per G tube BID   QUEtiapine 25 mg Oral QHS   rivaroxaban 20 mg per G tube Daily with dinner   scopolamine 1 patch Transdermal Q72H         Continuous Infusions:     . dextrose 5 % and  0.9% NaCl 75 mL/hr at 01/24/17 2058   . dilTIAZem 5 mg/hr (01/25/17 0445)          acetaminophen 650 mg Q4H PRN   Or     acetaminophen 650 mg Q4H PRN   naloxone 0.2 mg PRN   ondansetron 4 mg Q6H PRN   Or     ondansetron 4 mg Q6H PRN   oxyCODONE-acetaminophen 1 tablet Q6H PRN   promethazine 6.25 mg Q6H PRN   senna 17.2 mg QD PRN   sodium chloride 200 mL Q12H PRN           Physical Exam:     VITAL SIGNS   Temp:  [97.8 F (36.6 C)-99.1 F (37.3 C)] 98.5 F (36.9 C)  Heart Rate:  [89-135] 108  Resp Rate:  [18-39] 18  BP: (107-136)/(44-90) 114/60  FiO2:  [30 %] 30 %  POCT Glucose Result (Read Only)  Avg: 120.4  Min: 82  Max: 154  SpO2: 97 %    Intake/Output Summary (Last 24 hours) at 01/25/17 0556  Last data filed at 01/24/17 1000   Gross per 24 hour   Intake  0 ml   Output              800 ml   Net             -800 ml         General: Awake, alert, in no acute distress.   Heent: pinkish conjunctiva, anicteric sclera, moist mucus membrane   Neck : tracheostomy in situ and on vent    Cvs: S1 & S 2 well heard, regular rate and rhythm   Chest: Clear to auscultation   Abdomen: Distended, Hypertympanitic, non-tender, active bowel sounds.   Ext : no cyanosis,trace edema   CNS: Alert, follows command,    Laboratory Results:     CHEMISTRY:     Recent Labs  Lab 01/25/17  0331 01/23/17  0428 01/21/17  0345 01/20/17  0359 01/18/17  1726   Glucose 142* 115* 108* 89 111*   BUN 37.0* 20.0 26.0 29.0* 26.0   Creatinine 1.3 0.7 0.7 0.7 0.7   Calcium 9.4 8.5 8.2 8.2 8.1   Sodium 139 138 139 138 141   Potassium 4.3 3.7 4.0 3.6 3.7   Chloride 101 104 104 102 103   CO2 25 26 27 28 29        HEMATOLOGY:    Recent Labs  Lab 01/25/17  0331 01/23/17  0428 01/21/17  0345 01/20/17  0359 01/19/17  1753   WBC 19.54* 12.49* 11.26* 12.82* 13.14*   Hgb 11.0* 8.9* 8.7* 8.4* 8.5*   Hematocrit 35.1* 28.3* 27.9* 26.8* 27.7*   MCV 83.0 83.5 84.5 83.2 86.0   MCH 26.0* 26.3* 26.4* 26.1* 26.4*   MCHC 31.3* 31.4* 31.2* 31.3* 30.7*    Platelets 207 111* 96* 93* 256       MICROBIOLOGY:  Microbiology Results     Procedure Component Value Units Date/Time    Bacterial ID & Sens Non-Urine Isolate [[id number]] Collected:  01/08/17 1029     Updated:  01/12/17 1334    Narrative:       [id number] called Micro Results of_pos bld. Results read back [id number], by [id number]  on 01/10/2017 at 00:36  Source: Blood  Test code: 95621H  Submitted: BAP  Send isolate slant    Blood Culture Aerobic/Anaerobic #1 [[id number]] Collected:  01/08/17 1029    Specimen:  Arm from Blood Updated:  01/13/17 1338    Narrative:       [id number] called Micro Results of_pos bld. Results read back [id number], by [id number]  on 01/10/2017 at 00:36  Source: Blood  Test code: 01027O  Submitted: BAP  ORDER#: [id number]                                    ORDERED BY: Lehman Prom  SOURCE: Blood arm                                    COLLECTED:  01/08/17 10:29  ANTIBIOTICS AT COLL.:                                RECEIVED :  01/08/17 15:23  [id number] called Micro Results of_pos bld. Results read back [id number], by [phone] on 01/10/2017 at 00:36  Culture Blood Aerobic and Anaerobic        PRELIM  01/13/17 13:38   +  01/09/17   No Growth after 1 day/s of incubation.  01/10/17   Anaerobic Blood Culture Positive in 24 to 48 hours             Gram Stain Shows: Gram positive rods             Identification to follow  01/12/17   Aerobic Blood Culture Positive after 4 days             Gram Stain Shows: Gram positive rods             Identification to follow  01/11/17   Growth of Gram-positive rod               Identification to follow             Isolate sent to reference lab        Blood Culture Aerobic/Anaerobic #2 [[id number]] Collected:  01/08/17 1029    Specimen:  Arm from Blood Updated:  01/13/17 1552    Narrative:       [id number] called Micro Results of_pos bld. Results read back [id number], by [id number]  on 01/09/2017 at 21:45  ORDER#: [id number]                                    ORDERED BY: Lehman Prom  SOURCE:  Blood arm                                    COLLECTED:  01/08/17 10:29  ANTIBIOTICS AT COLL.:                                RECEIVED :  01/08/17 15:23  [id number]_ called Micro Results of_pos bld. Results read back [id number], by [id number] on 01/09/2017 at 21:45  Culture Blood Aerobic and Anaerobic        PRELIM      01/13/17 15:52   +  01/09/17   Anaerobic Blood Culture Positive in 24 to 48 hours             Gram Stain Shows: Gram positive rods  01/11/17   Aerobic culture no growth to date, final report to follow  01/13/17   Aerobic Blood Culture No Growth  01/11/17   Growth of Gram-positive rod               Identification to follow        CULTURE BLOOD AEROBIC AND ANAEROBIC [[id number]] Collected:  01/20/17 2115    Specimen:  Blood, Venipuncture Updated:  01/24/17 0421    Narrative:       ORDER#: [id number]                                    ORDERED BY: Gearldine Shown  SOURCE: Blood, Venipuncture arm                      COLLECTED:  01/20/17 21:15  ANTIBIOTICS AT COLL.:                                RECEIVED :  01/21/17 03:50  Culture  Blood Aerobic and Anaerobic        PRELIM      01/24/17 04:21  01/22/17   No Growth after 1 day/s of incubation.  01/23/17   No Growth after 2 day/s of incubation.  01/24/17   No Growth after 3 day/s of incubation.      CULTURE BLOOD AEROBIC AND ANAEROBIC [[id number]] Collected:  01/13/17 1646    Specimen:  Blood, Venipuncture Updated:  01/18/17 2221    Narrative:       ORDER#: [id number]                                    ORDERED BY: CHUTUAPE, ARTHU  SOURCE: Blood, Venipuncture peripheral               COLLECTED:  01/13/17 16:46  ANTIBIOTICS AT COLL.:                                RECEIVED :  01/13/17 20:15  Culture Blood Aerobic and Anaerobic        FINAL       01/18/17 22:21  01/18/17   No growth after 5 days of incubation.      MRSA Culture [[id number]] Collected:  01/08/17 1029    Specimen:  Body Fluid from Nasal/Throat ASC Admission Updated:  01/09/17 1653    Narrative:       ORDER#:  [id number]                                    ORDERED BY: Lehman Prom  SOURCE: Nares and Throat                             COLLECTED:  01/08/17 10:29  ANTIBIOTICS AT COLL.:                                RECEIVED :  01/08/17 15:48  Culture MRSA Surveillance                  FINAL       01/09/17 16:53  01/09/17   Negative for Methicillin Resistant Staph aureus from Nares and             Negative for Methicillin Resistant Staph aureus from Throat      MRSA culture [[id number]] Collected:  01/09/17 0423    Specimen:  Body Fluid from Nasal/Throat ASC Admission Updated:  01/11/17 0950    Narrative:       ORDER#: [id number]                                    ORDERED BY: AL KHOURI, SAMI  SOURCE: Nares and Throat                             COLLECTED:  01/09/17 04:23  ANTIBIOTICS AT COLL.:                                RECEIVED :  01/09/17 10:27  Culture MRSA Surveillance                  FINAL       01/11/17 09:50   +  01/11/17   Positive for Methicillin Resistant Staph aureus from Nares             Negative for Methicillin Resistant Staph aureus from Throat      Sputum Culture [[id number]] Collected:  01/08/17 1129    Specimen:  Sputum from Tracheal Aspirate Updated:  01/11/17 1448    Narrative:       ORDER#: [id number]                                    ORDERED BY: Lehman Prom  SOURCE: Tracheal Aspirate trach                      COLLECTED:  01/08/17 11:29  ANTIBIOTICS AT COLL.:                                RECEIVED :  01/08/17 16:01  Stain, Gram (Respiratory)                  FINAL       01/08/17 17:23  01/08/17   Few WBC's             Rare WBC's             Moderate Mixed Respiratory Flora  Culture and Gram Stain, Aerobic, RespiratorFINAL       01/11/17 14:48   +  01/09/17   Heavy growth of mixed upper respiratory flora  01/11/17   Heavy growth of Serratia marcescens               Morganella, Serratia, Providencia, Hafnia, Citrobacter, and             Enterobacter species may develop resistance while on  therapy             with B-lactam antibiotics, even if initially susceptible, due             to derepression of AmpC B-lactamase. CLSI M100-S24,             [place] [facility] Antimicrobial Subcommittee June 2015    01/11/17   Heavy growth of Pseudomonas aeruginosa      _____________________________________________________________________________                                  S.marcescens    P.aeruginosa    ANTIBIOTICS                     MIC  INTRP      MIC  INTRP      _____________________________________________________________________________  Amikacin                                        <=8    S        Amoxicillin/CA                 >16/8   R  Ampicillin                      >16    R                        Aztreonam                       <=2    S         4     S        Cefazolin                       >16    R                        Cefepime                        <=1    S         2     S        Ceftazidime                     <=2    S        <=2    S        Ceftriaxone                     <=1    S                        Ciprofloxacin                    2     I         2     I        Ertapenem                     <=0.25   S                        Gentamicin                      <=2    S        <=2    S        Levofloxacin                    <=1    S         4     I        Meropenem                     <=0.25   S         8     R        Piperacillin/Tazobactam        <=4/4   S        4/4    S        Tetracycline                    >8     R  Tobramycin                                      <=2    S        _____________________________________________________________________________            S=SUSCEPTIBLE     I=INTERMEDIATE     R=RESISTANT                            N/S=NON-SUSCEPTIBLE  _____________________________________________________________________________      Urine culture [[id number]] Collected:  01/19/17 1748    Specimen:  Urine from Urine, Catheterized, In &  Out Updated:  01/20/17 1946    Narrative:       ORDER#: [id number]                                    ORDERED BY: Oval Linsey  SOURCE: Urine, Catheterized, In & Out                COLLECTED:  01/19/17 17:48  ANTIBIOTICS AT COLL.:                                RECEIVED :  01/19/17 21:45  Culture Urine                              FINAL       01/20/17 19:46  01/20/17   No growth of >1,000 CFU/ML, No further work      Urine culture [[id number]] Collected:  01/08/17 1129    Specimen:  Urine from Urine, Catheterized, In & Out Updated:  01/10/17 1517    Narrative:       ORDER#: [id number]                                    ORDERED BY: Lehman Prom  SOURCE: Urine, Catheterized, In & Out                COLLECTED:  01/08/17 11:29  ANTIBIOTICS AT COLL.:                                RECEIVED :  01/08/17 15:48  Culture Urine                              FINAL       01/10/17 15:17   +  01/10/17   >100,000 CFU/ML MDR Proteus mirabilis               Cefazolin results predict results for the oral agents             cefuroxime axetil, cephalexin, cefdinir, cefpodoxime, and             cefprozil when used for therapy of uncomplicated UTIs due to             E.coli, K. pneumonia, and P. mirabilis. CLSI M100 S25             This multidrug resistant (MDR) Enterobacteriaceae  is resistant             to ceftriaxone and may not respond optimally to B-lactam             antibiotics (excluding carbapenems).             [place] [facility] Antimicrobial Subcommittee June 2015    _____________________________________________________________________________                                MDR P.mirabilis   ANTIBIOTICS                     MIC  INTRP      _____________________________________________________________________________  Amikacin                        >32    R        Amoxicillin/CA                 <=4/2   S        Ampicillin                      >16    R        Aztreonam                       >16    R        Cefazolin                        >16    R        Cefepime                        >16    R        Ceftazidime                     >16    R        Ceftriaxone                     >32    R        Ciprofloxacin                   >2     R        Ertapenem                     <=0.25   S        Gentamicin                      >8     R        Levofloxacin                    >4     R        Nitrofurantoin                  >64    R  D1    Piperacillin/Tazobactam        <=2/4   S        Tetracycline                    >8  R        Tobramycin                      >8     R        Trimethoprim/Sulfamethoxazole  >2/38   R          -----DRUG COMMENTS----------    D1:  Nitrofurantoin should only be used for the treatment of         uncomplicated cystitis.         [place] [facility] Antimicrobial Subcommittee June 2015  _____________________________________________________________________________            S=SUSCEPTIBLE     I=INTERMEDIATE     R=RESISTANT                            N/S=NON-SUSCEPTIBLE  _____________________________________________________________________________              Radiology Results:   Radiological Procedure reviewed.  Ct Head Wo Contrast    Result Date: 01/08/2017    No acute intracranial abnormality. Wyatt Portela, MD 01/08/2017 11:16 AM    Xr Chest Ap Portable    Result Date: 01/24/2017   Stable chest Gaynelle Arabian, MD 01/24/2017 11:07 AM    Xr Chest Ap Portable    Result Date: 01/21/2017   Worsening pulmonary edema pattern with underlying worsening small to moderate bilateral pleural effusions Laurena Slimmer, MD 01/21/2017 8:00 AM    Xr Chest Ap Portable    Result Date: 01/19/2017  1. Stable small layering bilateral pleural effusions. 2. Consolidation throughout the retrocardiac region which may be secondary to layering pleural fluid, subsegmental atelectatic changes and/or infiltrates.  Fonnie Mu, MD 01/19/2017 12:57 PM    Xr Chest Ap Portable    Result Date: 01/10/2017   Technically limited portable study. Increased bilateral pleural  effusions and bibasilar opacification. Heron Nay, MD 01/10/2017 8:01 AM    Xr Chest  Ap Portable    Result Date: 01/08/2017    Interval improvement of the vascular congestion and pulmonary edema and decrease in size of the pleural effusions. Heron Nay, MD 01/08/2017 11:29 AM    Xr Abdomen Portable    Result Date: 01/24/2017   Multiple dilated small bowel loops consistent with mechanical obstruction or ileus. Gustavus Messing, MD 01/24/2017 5:10 PM    G,j,g/j Tube Replacement    Result Date: 01/10/2017    Sinogram of the old gastrostomy tube tract allowing recanalization and redilation with eventual replacement of 20 French MIC gastrostomy catheter in good position the stomach. The new catheter may be used immediately. Dara Lords, MD 01/10/2017 11:05 AM      Signed by: Ledora Bottcher M.D.  Spectra Link: [id number]  Office Phone Number : [phone]

## 2017-01-25 NOTE — Progress Notes (Signed)
MCR Fx PNA    From WB, Noted w/ ileus/obstruction. Restart Zosyn in view of worsening leukocytosis and risk of aspiration due to vomiting    DCP WB vent unit when stable    Ambulance transport    Madie Reno, RN  Clinical Case Manager I  [phone]

## 2017-01-25 NOTE — Progress Notes (Signed)
abd soft not tender  Await ct scan

## 2017-01-25 NOTE — Progress Notes (Signed)
Pt two IV's stopped working, removed one. Pt refusing new Iv, agitated and trying to punch staff. Unable to place new IV at this time, will let pt rest and try again in one hour. Diltiazem drip on hold for now.

## 2017-01-25 NOTE — Progress Notes (Signed)
Nutritional Support Services  Nutrition Note    [name] 83 y.o. male   MRN: [id number]    Nutrition Brief Note    F/U to RDN assessment 2/12. Spoke with RN [name], states pt with very high residuals yesterday and vomiting 800 mls. Per RN [name], suspect pt may have aspirated. Pt on Reglan x several days. Noted TF d/c'd for now, suspect mechanical obstruction/ileus. Per RN [name], NGT to be placed and surgery consulted. Will monitor for plan/recommendations.     Will continue to monitor at moderate nutrition risk and will reassess within 7 days or PRN.    [name], RDN   Ext. [phone]

## 2017-01-25 NOTE — Plan of Care (Signed)
Problem: Artificial Airway  Goal: Tracheostomy will be maintained  Outcome: Progressing   01/25/17 0415   Goal/Interventions addressed this shift   Tracheostomy will be maintained Suction secretions as needed;Keep head of bed at 30 degrees, unless contraindicated;Encourage/perform oral hygiene as appropriate;Perform deep oropharyngeal suctioning at least every 4 hours;Apply water-based moisturizer to lips;Utilize tracheostomy securing device;Support ventilator tubing to avoid pressure from drag of tubing;Tracheostomy care every shift and as needed;Maintain surgical airway kit or tracheostomy tray at bedside;Keep additional tracheostomy tube of the same size and one size smaller at bedside   Pt has thick creamy secretions. Oral care done. Oozing from trach site. Clear/diminished lung sounds. On vent, PRVC.

## 2017-01-25 NOTE — Progress Notes (Signed)
Infectious Diseases & Tropical Medicine  Progress Note    01/25/2017   Deakon Frix CZY:[id number],NAT:[id number] is a 83 y.o. 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   Status post tracheostomy and G-tube placement.   Risk of aspiration   Resident of [name]    Plan:      Restart Zosyn in view of worsening leukocytosis and risk of aspiration due to vomiting   Continue probiotics.   Monitor electrolytes and renal functions closely.   Monitor clinically .   Discussed with pharmacy.    ROS:     General: No fever, no chills, no rigor,comfortable  Respiratory: no cough, shortness of breath, or wheezing, Tracheostomy in place   Gastrointestinal: no diarrhea, patient had vomiting last night  Genito-Urinary: no hematuria  Musculoskeletal: no edema  Neurological: Awake  Dermatological: no rash    Physical Examination:     Blood pressure 114/60, pulse (!) 108, temperature 97.9 F (36.6 C), temperature source Oral, resp. rate 18, height 1.854 m (6' 0.99"), weight 103.9 kg (229 lb), SpO2 98 %.     General Appearance: Comfortable, and in no acute distress.   HEENT: Pupils are equal, round, and reactive to light.    Lungs: Decreased breath sounds   Heart: Tachycardia   Chest: Symmetric chest wall expansion.    Abdomen: Slightly distended ,non tender,no hepatosplenomegaly,good bowel sounds   Neurological: Paraplegia   Extremities: No edema    Laboratory And Diagnostic Studies:     Recent Labs      01/25/17   0331  01/23/17   0428   WBC  19.54*  12.49*   Hgb  11.0*  8.9*   Hematocrit  35.1*  28.3*   Platelets  207  111*     Recent Labs      01/25/17   0331  01/23/17   0428   Sodium  139  138   Potassium  4.3  3.7   Chloride  101  104   CO2  25  26   BUN   37.0*  20.0   Creatinine  1.3  0.7   Glucose  142*  115*   Calcium  9.4  8.5     No results for input(s): AST, ALT, ALKPHOS, PROT, ALB in the last 72 hours.    Current Meds:      Scheduled Meds: PRN Meds:        albuterol-ipratropium 3 mL Nebulization Q6H   amiodarone 200 mg Oral Daily   atorvastatin 20 mg per G tube QHS   balsam peru-castor oil (VENELEX)  Topical Q12H   bumetanide 1 mg per G tube BID   famotidine 20 mg Oral QHS   glycopyrrolate 1 mg per G tube Daily   lactobacillus/streptococcus 1 capsule per G tube Daily   metoclopramide 5 mg per G tube Q8H   metoprolol tartrate 50 mg per G tube BID   QUEtiapine 25 mg Oral QHS   rivaroxaban 20 mg per G tube Daily with dinner   scopolamine 1 patch Transdermal Q72H       Continuous Infusions:  . dextrose 5 % and 0.9% NaCl 75 mL/hr at 01/24/17 2058   . dilTIAZem 5 mg/hr (01/25/17 0445)  acetaminophen 650 mg Q4H PRN   Or     acetaminophen 650 mg Q4H PRN   naloxone 0.2 mg PRN   ondansetron 4 mg Q6H PRN   Or     ondansetron 4 mg Q6H PRN   oxyCODONE-acetaminophen 1 tablet Q6H PRN   promethazine 6.25 mg Q6H PRN   senna 17.2 mg QD PRN   sodium chloride 200 mL Q12H PRN         Melanni Benway A. Janalyn Rouse, M.D.  01/25/2017  9:17 AM

## 2017-01-25 NOTE — Consults (Signed)
MIDLINE INSERTION PROCEDURE     Bard,Benard GRADY  01/25/2017    INDICATIONS: Therapy less than 28 days    The midline procedure, risks, benefits were discussed with thecaregiver.  All questions were answered  caregiver verbalized understanding and agreed to proceed. Midline education/instructions provided to caregiver.    PROCEDURE DETAILS:   The patient was positioned and Ultrasound was used to confirm patency of the Left Basilic vein prior to obtaining venous access. The arm was scrubbed with 2% chlorhexidine per guidelines and a maximal sterile field was established for the patient.  The clinician was attired with cap, mask and sterile gown/gloves prior to start.     ARROW DL POWER Midline:  A sterile cover was sheathed to the Ultrasound probe.  The vein was then revisualized and 1% lidocaine injected prior to puncture of the Left Basilic vein with a 21-gauge single-wall needle under direct sonographic guidance.  The guidewire was advanced through the needle, the needle was removed and a peel-away sheath was placed over the wire.  After measuring from the insertion site to the midline of the upper arm the catheter was trimmed to insure tip location below the axillary.  The catheter was then advanced through the peel-away sheath.  The sheath was removed.  The catheter was flushed with normal saline to confirm brisk blood return and capped.  The catheter was stabilized on the skin using a securement device.  Antimicrobial disk and sterile transparent occlusive dressing applied using aseptic technique.    Patient did tolerate the procedure well.   Catheter Type: ARROW DL POWER MIDLINE  Insertion Site: Left Basilic vein  Total length: 15 CM  Internal Length:  15 CMcm  External Length: 0cm  UAC: 30 cm    Midline Reference #: [id number]  Midline Kit Lot#: [id number]  Midline Kit expiration date: 2017-12-09    Findings/Conclusions:  No signs of bleeding or symptoms of nerve irritation noted at time of insertion  procedure.    Tip location is below the level of the axilla in Basilic vein. Midline is ready for immediate use.    Midline is ready for immediate use    Hsiu-I Doreen Beam, RN

## 2017-01-25 NOTE — Progress Notes (Signed)
RN Jonny Ruiz and [employer] RN attempted to place NGT following 1x dose of IV ativan. Unable to pass NGT despite multiple attempts.

## 2017-01-26 LAB — BASIC METABOLIC PANEL
Anion Gap: 9 (ref 5.0–15.0)
BUN: 38 mg/dL — ABNORMAL HIGH (ref 9.0–28.0)
CO2: 26 mEq/L (ref 22–29)
Calcium: 8.4 mg/dL (ref 7.9–10.2)
Chloride: 105 mEq/L (ref 100–111)
Creatinine: 1 mg/dL (ref 0.7–1.3)
Glucose: 110 mg/dL — ABNORMAL HIGH (ref 70–100)
Potassium: 3.4 mEq/L — ABNORMAL LOW (ref 3.5–5.1)
Sodium: 140 mEq/L (ref 136–145)

## 2017-01-26 LAB — CBC AND DIFFERENTIAL
Absolute NRBC: 0 10*3/uL
Basophils Absolute Automated: 0.01 10*3/uL (ref 0.00–0.20)
Basophils Automated: 0.1 %
Eosinophils Absolute Automated: 0.05 10*3/uL (ref 0.00–0.70)
Eosinophils Automated: 0.4 %
Hematocrit: 31 % — ABNORMAL LOW (ref 42.0–52.0)
Hgb: 9.6 g/dL — ABNORMAL LOW (ref 13.0–17.0)
Immature Granulocytes Absolute: 0.16 10*3/uL — ABNORMAL HIGH
Immature Granulocytes: 1.2 %
Lymphocytes Absolute Automated: 0.99 10*3/uL (ref 0.50–4.40)
Lymphocytes Automated: 7.2 %
MCH: 26 pg — ABNORMAL LOW (ref 28.0–32.0)
MCHC: 31 g/dL — ABNORMAL LOW (ref 32.0–36.0)
MCV: 84 fL (ref 80.0–100.0)
MPV: 13.7 fL — ABNORMAL HIGH (ref 9.4–12.3)
Monocytes Absolute Automated: 0.96 10*3/uL (ref 0.00–1.20)
Monocytes: 7 %
Neutrophils Absolute: 11.64 10*3/uL — ABNORMAL HIGH (ref 1.80–8.10)
Neutrophils: 84.1 %
Nucleated RBC: 0 /100 WBC (ref 0.0–1.0)
Platelets: 97 10*3/uL — ABNORMAL LOW (ref 140–400)
RBC: 3.69 10*6/uL — ABNORMAL LOW (ref 4.70–6.00)
RDW: 17 % — ABNORMAL HIGH (ref 12–15)
WBC: 13.81 10*3/uL — ABNORMAL HIGH (ref 3.50–10.80)

## 2017-01-26 LAB — GFR: EGFR: >60

## 2017-01-26 LAB — HEMOLYSIS INDEX: Hemolysis Index: 6 (ref 0–18)

## 2017-01-26 LAB — GLUCOSE WHOLE BLOOD - POCT
Whole Blood Glucose POCT: 100 mg/dL (ref 70–100)
Whole Blood Glucose POCT: 110 mg/dL — ABNORMAL HIGH (ref 70–100)
Whole Blood Glucose POCT: 117 mg/dL — ABNORMAL HIGH (ref 70–100)
Whole Blood Glucose POCT: 97 mg/dL (ref 70–100)

## 2017-01-26 MED ORDER — FAMOTIDINE 10 MG/ML IV SOLN (WRAP)
20.0000 mg | Freq: Two times a day (BID) | INTRAVENOUS | Status: DC
Start: 2017-01-26 — End: 2017-01-30
  Administered 2017-01-26 – 2017-01-30 (×9): 20 mg via INTRAVENOUS
  Filled 2017-01-26 (×9): qty 2

## 2017-01-26 MED ORDER — POTASSIUM CHLORIDE 10 MEQ/100ML IV SOLN
10.0000 meq | INTRAVENOUS | Status: AC
Start: 2017-01-26 — End: 2017-01-26
  Administered 2017-01-26 (×2): 10 meq via INTRAVENOUS
  Filled 2017-01-26: qty 200

## 2017-01-26 MED ORDER — POTASSIUM CHLORIDE 20 MEQ/50ML IV SOLN
20.0000 meq | Freq: Once | INTRAVENOUS | Status: DC
Start: 2017-01-26 — End: 2017-01-26

## 2017-01-26 NOTE — Progress Notes (Signed)
Pt tolerated ps/cpap 10/5 cmh20 for . He was changed to 15/5 cmh20 due to increased heart rate to 130 and Resp rate to the high 20.   Pt is confortable on ps/cpap 15/5 cmh20. Will continue to monitor

## 2017-01-26 NOTE — Plan of Care (Signed)
Problem: Hemodynamic Status: Cardiac  Goal: Stable vital signs and fluid balance  Outcome: Progressing   01/26/17 0021   Goal/Interventions addressed this shift   Stable vital signs and fluid balance Monitor/assess vital signs and telemetry per unit protocol;Weigh on admission and record weight daily;Assess signs and symptoms associated with cardiac rhythm changes;Monitor intake/output per unit protocol and/or LIP order     Pt is alert & non-verbal. A fib on the tele with rate controlled in the 70s-80s. Continue cardizem drip @5ml /hr.      Problem: Pain  Goal: Pain at adequate level as identified by patient  Outcome: Progressing   01/26/17 0105   Goal/Interventions addressed this shift   Pain at adequate level as identified by patient Identify patient comfort function goal;Evaluate if patient comfort function goal is met;Evaluate patient's satisfaction with pain management progress     No sign of pain. Abdomen is distended, soft, not tender. Pt is NPO, medication given via g-tube, no residual.    Problem: Artificial Airway  Goal: Tracheostomy will be maintained  Outcome: Progressing   01/26/17 0105   Goal/Interventions addressed this shift   Tracheostomy will be maintained Suction secretions as needed;Keep head of bed at 30 degrees, unless contraindicated;Encourage/perform oral hygiene as appropriate;Perform deep oropharyngeal suctioning at least every 4 hours;Apply water-based moisturizer to lips;Utilize tracheostomy securing device;Support ventilator tubing to avoid pressure from drag of tubing;Tracheostomy care every shift and as needed;Maintain surgical airway kit or tracheostomy tray at bedside     Pt is on vent/trach. Has thick yellow secretion. Continue vent/trach care, oral care, suction PRN & turn/reposition.     Problem: Infection  Goal: Free from infection  Outcome: Progressing   01/26/17 0105   OTHER   Free from infection  Assess for signs/symptoms of infection;Utilize isolation precautions per  protocol/policy;Utilize sepsis protocol     Pt is afebrile, WBC elevated 19.54. Continue with contact isolation for MDR & IV antibiotics.

## 2017-01-26 NOTE — Consults (Signed)
Service Date: 01/26/2017     Patient Type: I     CONSULTING PHYSICIAN: Rickey Primus MD     REFERRING PHYSICIAN:      HISTORY OF PRESENT ILLNESS:  This is an 83 year old male with numerous significant medical problems,  history of numerous different types of infections, brought in due to change  of mental status.  He was thought perhaps to have a urinary tract infection  and perhaps also pulmonary issues.  The patient is a quadriplegic, chronic  patient at [facility].  He has a tracheostomy in place and G-tube in place.       PAST MEDICAL HISTORY:   Other medical issues are hypertension and numerous previous infections.  He  does have a tracheostomy and G-tube.  Apparently, no other major abdominal  surgeries.     CURRENT MEDICATIONS:,  Include nebulizers, Lipitor, Pepcid, Reglan, Zosyn, Seroquel, Xarelto.     SOCIAL HISTORY:  Apparently does not smoke or drink.     REVIEW OF SYSTEMS:  Cannot be obtained.     PHYSICAL EXAMINATION:   VITAL SIGNS:  He is on the ventilator.  GENERAL:  Responds minimally.  ABDOMEN:  Soft.  Does not appear tender.  G-tube is in place.  EXTREMITIES:  Unremarkable.  NEUROLOGIC:  He cannot really be assessed at this time.     LABORATORY AND DIAGNOSTIC DATA:   White count was 26,000, today is 19,000.  His plain films which were  concerning for either a bowel obstruction or an ileus.     IMPRESSION:  Elderly male with numerous medical problems, on the ventilator with a  G-tube, paraplegic, in the hospital with likely either urosepsis and/or  pneumonia with distended loops of bowel on his plain films.  Currently  today, clinically, though has improved at least by abdominal exam and  talking to the nurse.     PLAN:  Would be to keep him n.p.o., place the G-tube to gravity and await results  of his CT scan.           D:  01/26/2017 09:54 AM by Dr. Carole Binning. Elmon Else, MD [phone])  T:  01/26/2017 13:09 PM by       Everlean Cherry: [id number]) (Doc ID: [id number])

## 2017-01-26 NOTE — Progress Notes (Signed)
Infectious Diseases [facility]  Progress Note    01/26/2017   Nilay Mangrum [id number] is a 83 y.o. male,       Assessment:      Small bowel obstruction improving   Encephalopathy.   Possibility of aspiration pneumonia   CT scan of the abdomen-possible right middle lobe infiltrate (01/26/2017)   Gram-positive rods bacteremia (2/2)-identification still pending sent out to [facility]   Leukocytosis improving    History of quadriparesis secondary to neck fracture.   Status post tracheostomy and G-tube placement.   Resident of [facility] nursing home    Plan:      Continue Zosyn.   Continue probiotics.   Pro-calcitonin level   Monitor electrolytes and renal functions closely.   Monitor clinically .    ROS:     General: No fever, no chills, no rigor,comfortable  Respiratory: no cough, shortness of breath, or wheezing, Tracheostomy in place   Gastrointestinal: no diarrhea  Genito-Urinary: no hematuria  Musculoskeletal: no edema  Neurological: Awake  Dermatological: no rash    Physical Examination:     Blood pressure 120/62, pulse 78, temperature 98.1 F (36.7 C), temperature source Axillary, resp. rate (!) 28, height 1.854 m (6' 0.99"), weight 103.9 kg (229 lb), SpO2 100 %.     General Appearance: Comfortable, and in no acute distress.   HEENT: Pupils are equal, round, and reactive to light.    Lungs: Decreased breath sounds   Heart: Regular rate and rhythm   Chest: Symmetric chest wall expansion.    Abdomen: Soft ,non tender,no hepatosplenomegaly,good bowel sounds   Neurological: Paraplegia   Extremities: No edema    Laboratory And Diagnostic Studies:     Recent Labs      01/26/17   0436  01/25/17   0331   WBC  13.81*  19.54*   Hgb  9.6*  11.0*   Hematocrit  31.0*  35.1*   Platelets  97*  207     Recent Labs      01/26/17   0436  01/25/17   0331   Sodium  140  139   Potassium  3.4*  4.3   Chloride  105  101   CO2  26  25   BUN  38.0*  37.0*   Creatinine  1.0  1.3   Glucose   110*  142*   Calcium  8.4  9.4     No results for input(s): AST, ALT, ALKPHOS, PROT, ALB in the last 72 hours.    Current Meds:      Scheduled Meds: PRN Meds:        albuterol-ipratropium 3 mL Nebulization Q6H   amiodarone 200 mg Oral Daily   atorvastatin 20 mg per G tube QHS   balsam peru-castor oil (VENELEX)  Topical Q12H   famotidine 20 mg Oral QHS   glycopyrrolate 1 mg per G tube Daily   lactobacillus/streptococcus 1 capsule per G tube Daily   metoclopramide 5 mg per G tube Q8H   metoprolol tartrate 50 mg per G tube BID   piperacillin-tazobactam 4.5 g Intravenous Q8H   potassium chloride 20 mEq Intravenous Once   QUEtiapine 25 mg Oral QHS   rivaroxaban 20 mg per G tube Daily with dinner   scopolamine 1 patch Transdermal Q72H       Continuous Infusions:  . dextrose 5 % and 0.9% NaCl 75 mL/hr at 01/25/17 1600   . dilTIAZem 5 mg/hr (01/26/17 0740)  acetaminophen 650 mg Q4H PRN   Or     acetaminophen 650 mg Q4H PRN   naloxone 0.2 mg PRN   ondansetron 4 mg Q6H PRN   Or     ondansetron 4 mg Q6H PRN   oxyCODONE-acetaminophen 1 tablet Q6H PRN   promethazine 6.25 mg Q6H PRN   senna 17.2 mg QD PRN   sodium chloride 200 mL Q12H PRN         Cloyde Oregel A. Janalyn Rouse, M.D.  01/26/2017  10:47 AM

## 2017-01-26 NOTE — Plan of Care (Signed)
Problem: Inadequate Gas Exchange  Goal: Adequate oxygenation and improved ventilation   01/26/17 2019   Goal/Interventions addressed this shift   Adequate oxygenation and improved ventilation Assess lung sounds;Monitor SpO2 and treat as needed;Provide mechanical and oxygen support to facilitate gas exchange;Position for maximum ventilatory efficiency;Plan activities to conserve energy: plan rest periods;Increase activity as tolerated/progressive mobility;Consult/collaborate with Respiratory Therapy   Pt weaned to CPAP 15/5, HR up to 120 at times but not sustaining, returning to 80s. Still AFIb, on Diltiazem gtt @5mg /hr. Denies CP or SOB. No vomiting, denies nausea, no residuals and hypoactive bowel sounds. Tube feeding restarted @ 10cc/hr continuous.

## 2017-01-26 NOTE — Progress Notes (Signed)
[facility]  INTERNAL MEDICINE PROGRESS NOTE    Date Time: 01/26/17 8:07 AM  Patient Name: [name],[name] GRADY      Problem List:      Bowel Obstruction vs. Ileus   Nausea and Vomiting   Urine culture-MDR Proteus mirabilis-Status post 5 days course of treatment (gentamicin and Zosyn/ Urine culture/Repeat nonsignificant bacteria   Pneumoniawith Bibasilar consolidation( sputum culture with Serrata &Pseudomonas)   Leukocytosis   Malfunction G-tube/Replaced   Metabolic Encephalopathy   Acute on Chronic Respiratory Failure    Cardiomyopathy with last EF @40  percent   Hypertension   Chronic Atrial Fibrillation on AC     Plan:      Start Tube Feeding @ 10 ml /hr, monitor residual   Replace P0tassium with K-rider   CT abdomen and pelvis showed resolving SBO   Appreciate  [name] help   On Zosyn per ID recommendation   Continue IVF hydration, decrease rates if he tolerates tube feeding   Continue Cardizem and titrate down if his rate is better and continue amiodarone   DVT prophylaxis, on xarelto   Discussed plan of care with patient   Discussed plan of care with nurses..     Subjective:     [name] had bouts of vomtinig today . His abdomen was distended. His KUB showed ileus vs. Obstruction.   He is doing much better. CT abdomen and pelvis noted and improived SBO. I willl start him on   tube feeding today. Will monitor closely.      Medications:   Medications:   Scheduled Meds: PRN Meds:        albuterol-ipratropium 3 mL Nebulization Q6H   amiodarone 200 mg Oral Daily   atorvastatin 20 mg per G tube QHS   balsam peru-castor oil (VENELEX)  Topical Q12H   famotidine 20 mg Oral QHS   glycopyrrolate 1 mg per G tube Daily   lactobacillus/streptococcus 1 capsule per G tube Daily   metoclopramide 5 mg per G tube Q8H   metoprolol tartrate 50 mg per G tube BID   piperacillin-tazobactam 4.5 g Intravenous Q8H   potassium chloride 20 mEq Intravenous Once   QUEtiapine 25 mg Oral QHS   rivaroxaban  20 mg per G tube Daily with dinner   scopolamine 1 patch Transdermal Q72H         Continuous Infusions:     . dextrose 5 % and 0.9% NaCl 75 mL/hr at 01/25/17 1600   . dilTIAZem 5 mg/hr (01/26/17 0740)          acetaminophen 650 mg Q4H PRN   Or     acetaminophen 650 mg Q4H PRN   naloxone 0.2 mg PRN   ondansetron 4 mg Q6H PRN   Or     ondansetron 4 mg Q6H PRN   oxyCODONE-acetaminophen 1 tablet Q6H PRN   promethazine 6.25 mg Q6H PRN   senna 17.2 mg QD PRN   sodium chloride 200 mL Q12H PRN           Physical Exam:     VITAL SIGNS   Temp:  [97.7 F (36.5 C)-99 F (37.2 C)] 98.1 F (36.7 C)  Heart Rate:  [76-106] 77  Resp Rate:  [18-27] 18  BP: (93-118)/(58-75) 108/64  FiO2:  [30 %] 30 %  POCT Glucose Result (Read Only)  Avg: 119.8  Min: 82  Max: 154  SpO2: 100 %    Intake/Output Summary (Last 24 hours) at 01/26/17 1610  Last data  filed at 01/26/17 0740   Gross per 24 hour   Intake          2635.08 ml   Output                0 ml   Net          2635.08 ml         General: Awake, alert, in no acute distress.   Heent: pinkish conjunctiva, anicteric sclera, moist mucus membrane   Neck : tracheostomy in situ and on vent    Cvs: S1 & S 2 well heard, regular rate and rhythm   Chest: Clear to auscultation   Abdomen: mildly distended, non-tender, active bowel sounds.   Ext : no cyanosis,trace edema   CNS: Alert, follows command,    Laboratory Results:     CHEMISTRY:     Recent Labs  Lab 01/26/17  0436 01/25/17  0331 01/23/17  0428 01/21/17  0345 01/20/17  0359   Glucose 110* 142* 115* 108* 89   BUN 38.0* 37.0* 20.0 26.0 29.0*   Creatinine 1.0 1.3 0.7 0.7 0.7   Calcium 8.4 9.4 8.5 8.2 8.2   Sodium 140 139 138 139 138   Potassium 3.4* 4.3 3.7 4.0 3.6   Chloride 105 101 104 104 102   CO2 26 25 26 27 28        HEMATOLOGY:    Recent Labs  Lab 01/26/17  0436 01/25/17  0331 01/23/17  0428 01/21/17  0345 01/20/17  0359   WBC 13.81* 19.54* 12.49* 11.26* 12.82*   Hgb 9.6* 11.0* 8.9* 8.7* 8.4*   Hematocrit 31.0* 35.1* 28.3* 27.9*  26.8*   MCV 84.0 83.0 83.5 84.5 83.2   MCH 26.0* 26.0* 26.3* 26.4* 26.1*   MCHC 31.0* 31.3* 31.4* 31.2* 31.3*   Platelets 97* 207 111* 96* 93*       MICROBIOLOGY:  Microbiology Results     Procedure Component Value Units Date/Time    Bacterial ID & Sens Non-Urine Isolate [[id number]] Collected:  01/08/17 1029     Updated:  01/12/17 1334    Narrative:       [id number] called Micro Results of_pos bld. Results read back [id number], by [id number]  on 01/10/2017 at 00:36  Source: Blood  Test code: 95621H  Submitted: BAP  Send isolate slant    Blood Culture Aerobic/Anaerobic #1 [[id number]] Collected:  01/08/17 1029    Specimen:  Arm from Blood Updated:  01/13/17 1338    Narrative:       [id number] called Micro Results of_pos bld. Results read back [id number], by [id number]  on 01/10/2017 at 00:36  Source: Blood  Test code: 01027O  Submitted: BAP  ORDER#: [id number]                                    ORDERED BY: [name]  SOURCE: Blood arm                                    COLLECTED:  01/08/17 10:29  ANTIBIOTICS AT COLL.:                                RECEIVED :  01/08/17 15:23  [id number] called Micro Results of_pos bld. Results read back [id number], by [phone] on  01/10/2017 at 00:36  Culture Blood Aerobic and Anaerobic        PRELIM      01/13/17 13:38   +  01/09/17   No Growth after 1 day/s of incubation.  01/10/17   Anaerobic Blood Culture Positive in 24 to 48 hours             Gram Stain Shows: Gram positive rods             Identification to follow  01/12/17   Aerobic Blood Culture Positive after 4 days             Gram Stain Shows: Gram positive rods             Identification to follow  01/11/17   Growth of Gram-positive rod               Identification to follow             Isolate sent to reference lab        Blood Culture Aerobic/Anaerobic #2 [[id number]] Collected:  01/08/17 1029    Specimen:  Arm from Blood Updated:  01/13/17 1552    Narrative:       [id number] called Micro Results of_pos bld. Results read back [id number], by [id number]  on  01/09/2017 at 21:45  ORDER#: [id number]                                    ORDERED BY: [name]  SOURCE: Blood arm                                    COLLECTED:  01/08/17 10:29  ANTIBIOTICS AT COLL.:                                RECEIVED :  01/08/17 15:23  [id number]_ called Micro Results of_pos bld. Results read back [id number], by [id number] on 01/09/2017 at 21:45  Culture Blood Aerobic and Anaerobic        PRELIM      01/13/17 15:52   +  01/09/17   Anaerobic Blood Culture Positive in 24 to 48 hours             Gram Stain Shows: Gram positive rods  01/11/17   Aerobic culture no growth to date, final report to follow  01/13/17   Aerobic Blood Culture No Growth  01/11/17   Growth of Gram-positive rod               Identification to follow        CULTURE BLOOD AEROBIC AND ANAEROBIC [[id number]] Collected:  01/20/17 2115    Specimen:  Blood, Venipuncture Updated:  01/24/17 0421    Narrative:       ORDER#: [id number]                                    ORDERED BY: Gearldine Shown  SOURCE: Blood, Venipuncture arm                      COLLECTED:  01/20/17 21:15  ANTIBIOTICS AT COLL.:  RECEIVED :  01/21/17 03:50  Culture Blood Aerobic and Anaerobic        PRELIM      01/24/17 04:21  01/22/17   No Growth after 1 day/s of incubation.  01/23/17   No Growth after 2 day/s of incubation.  01/24/17   No Growth after 3 day/s of incubation.      CULTURE BLOOD AEROBIC AND ANAEROBIC [[id number]] Collected:  01/13/17 1646    Specimen:  Blood, Venipuncture Updated:  01/18/17 2221    Narrative:       ORDER#: [id number]                                    ORDERED BY: CHUTUAPE, ARTHU  SOURCE: Blood, Venipuncture peripheral               COLLECTED:  01/13/17 16:46  ANTIBIOTICS AT COLL.:                                RECEIVED :  01/13/17 20:15  Culture Blood Aerobic and Anaerobic        FINAL       01/18/17 22:21  01/18/17   No growth after 5 days of incubation.      MRSA Culture [[id number]] Collected:  01/08/17 1029     Specimen:  Body Fluid from Nasal/Throat ASC Admission Updated:  01/09/17 1653    Narrative:       ORDER#: [id number]                                    ORDERED BY: [name]  SOURCE: Nares and Throat                             COLLECTED:  01/08/17 10:29  ANTIBIOTICS AT COLL.:                                RECEIVED :  01/08/17 15:48  Culture MRSA Surveillance                  FINAL       01/09/17 16:53  01/09/17   Negative for Methicillin Resistant Staph aureus from Nares and             Negative for Methicillin Resistant Staph aureus from Throat      MRSA culture [[id number]] Collected:  01/09/17 0423    Specimen:  Body Fluid from Nasal/Throat ASC Admission Updated:  01/11/17 0950    Narrative:       ORDER#: [id number]                                    ORDERED BY: AL KHOURI, SAMI  SOURCE: Nares and Throat                             COLLECTED:  01/09/17 04:23  ANTIBIOTICS AT COLL.:  RECEIVED :  01/09/17 10:27  Culture MRSA Surveillance                  FINAL       01/11/17 09:50   +  01/11/17   Positive for Methicillin Resistant Staph aureus from Nares             Negative for Methicillin Resistant Staph aureus from Throat      Sputum Culture [[id number]] Collected:  01/08/17 1129    Specimen:  Sputum from Tracheal Aspirate Updated:  01/11/17 1448    Narrative:       ORDER#: [id number]                                    ORDERED BY: [name]  SOURCE: Tracheal Aspirate trach                      COLLECTED:  01/08/17 11:29  ANTIBIOTICS AT COLL.:                                RECEIVED :  01/08/17 16:01  Stain, Gram (Respiratory)                  FINAL       01/08/17 17:23  01/08/17   Few WBC's             Rare WBC's             Moderate Mixed Respiratory Flora  Culture and Gram Stain, Aerobic, RespiratorFINAL       01/11/17 14:48   +  01/09/17   Heavy growth of mixed upper respiratory flora  01/11/17   Heavy growth of Serratia marcescens               Morganella, Serratia,  Providencia, Hafnia, Citrobacter, and             Enterobacter species may develop resistance while on therapy             with B-lactam antibiotics, even if initially susceptible, due             to derepression of AmpC B-lactamase. CLSI M100-S24,             [place] June 2015    01/11/17   Heavy growth of Pseudomonas aeruginosa      _____________________________________________________________________________                                  S.marcescens    P.aeruginosa    ANTIBIOTICS                     MIC  INTRP      MIC  INTRP      _____________________________________________________________________________  Amikacin                                        <=8    S        Amoxicillin/CA                 >16/8   R  Ampicillin                      >16    R                        Aztreonam                       <=2    S         4     S        Cefazolin                       >16    R                        Cefepime                        <=1    S         2     S        Ceftazidime                     <=2    S        <=2    S        Ceftriaxone                     <=1    S                        Ciprofloxacin                    2     I         2     I        Ertapenem                     <=0.25   S                        Gentamicin                      <=2    S        <=2    S        Levofloxacin                    <=1    S         4     I        Meropenem                     <=0.25   S         8     R        Piperacillin/Tazobactam        <=4/4   S        4/4    S        Tetracycline                    >8     R  Tobramycin                                      <=2    S        _____________________________________________________________________________            S=SUSCEPTIBLE     I=INTERMEDIATE     R=RESISTANT                            N/S=NON-SUSCEPTIBLE  _____________________________________________________________________________       Urine culture [[id number]] Collected:  01/19/17 1748    Specimen:  Urine from Urine, Catheterized, In & Out Updated:  01/20/17 1946    Narrative:       ORDER#: [id number]                                    ORDERED BY: Oval Linsey  SOURCE: Urine, Catheterized, In & Out                COLLECTED:  01/19/17 17:48  ANTIBIOTICS AT COLL.:                                RECEIVED :  01/19/17 21:45  Culture Urine                              FINAL       01/20/17 19:46  01/20/17   No growth of >1,000 CFU/ML, No further work      Urine culture [[id number]] Collected:  01/08/17 1129    Specimen:  Urine from Urine, Catheterized, In & Out Updated:  01/10/17 1517    Narrative:       ORDER#: [id number]                                    ORDERED BY: [name]  SOURCE: Urine, Catheterized, In & Out                COLLECTED:  01/08/17 11:29  ANTIBIOTICS AT COLL.:                                RECEIVED :  01/08/17 15:48  Culture Urine                              FINAL       01/10/17 15:17   +  01/10/17   >100,000 CFU/ML MDR Proteus mirabilis               Cefazolin results predict results for the oral agents             cefuroxime axetil, cephalexin, cefdinir, cefpodoxime, and             cefprozil when used for therapy of uncomplicated UTIs due to             E.coli, K. pneumonia, and P. mirabilis. CLSI M100 S25             This multidrug resistant (MDR) Enterobacteriaceae  is resistant             to ceftriaxone and may not respond optimally to B-lactam             antibiotics (excluding carbapenems).             [place] [facility] June 2015    _____________________________________________________________________________                                MDR P.mirabilis   ANTIBIOTICS                     MIC  INTRP      _____________________________________________________________________________  Amikacin                        >32    R        Amoxicillin/CA                 <=4/2   S        Ampicillin                       >16    R        Aztreonam                       >16    R        Cefazolin                       >16    R        Cefepime                        >16    R        Ceftazidime                     >16    R        Ceftriaxone                     >32    R        Ciprofloxacin                   >2     R        Ertapenem                     <=0.25   S        Gentamicin                      >8     R        Levofloxacin                    >4     R        Nitrofurantoin                  >64    R  D1    Piperacillin/Tazobactam        <=2/4   S        Tetracycline                    >8  R        Tobramycin                      >8     R        Trimethoprim/Sulfamethoxazole  >2/38   R          -----DRUG COMMENTS----------    D1:  Nitrofurantoin should only be used for the treatment of         uncomplicated cystitis.         [place] [facility]im[facility] June 2015  _____________________________________________________________________________            S=SUSCEPTIBLE     I=INTERMEDIATE     R=RESISTANT                            N/S=NON-SUSCEPTIBLE  _____________________________________________________________________________              Radiology Results:   Radiological Procedure reviewed.  Ct Head Wo Contrast    Result Date: 01/08/2017    No acute intracranial abnormality. Wyatt Portela, MD 01/08/2017 11:16 AM    Ct Abdomen Pelvis W Iv Contrast Only    Result Date: 01/26/2017  1. Improving small bowel obstruction. 2. Inflammatory changes adjacent to the second portion of the duodenum favoring duodenitis. 3. Bilateral pleural effusions and bibasilar atelectasis. Right middle lobe infiltrate is not excluded. 4. Cholelithiasis. 5. Right nephrolithiasis. 6. Additional chronic findings as above. Jorene Guest, MD 01/26/2017 12:16 AM    Xr Chest Ap Portable    Result Date: 01/24/2017   Stable chest Gaynelle Arabian, MD 01/24/2017 11:07 AM    Xr Chest Ap Portable    Result Date: 01/21/2017   Worsening pulmonary edema pattern  with underlying worsening small to moderate bilateral pleural effusions Laurena Slimmer, MD 01/21/2017 8:00 AM    Xr Chest Ap Portable    Result Date: 01/19/2017  1. Stable small layering bilateral pleural effusions. 2. Consolidation throughout the retrocardiac region which may be secondary to layering pleural fluid, subsegmental atelectatic changes and/or infiltrates.  Fonnie Mu, MD 01/19/2017 12:57 PM    Xr Chest Ap Portable    Result Date: 01/10/2017   Technically limited portable study. Increased bilateral pleural effusions and bibasilar opacification. Heron Nay, MD 01/10/2017 8:01 AM    Xr Chest  Ap Portable    Result Date: 01/08/2017    Interval improvement of the vascular congestion and pulmonary edema and decrease in size of the pleural effusions. Heron Nay, MD 01/08/2017 11:29 AM    Xr Abdomen Portable    Result Date: 01/24/2017   Multiple dilated small bowel loops consistent with mechanical obstruction or ileus. Gustavus Messing, MD 01/24/2017 5:10 PM    G,j,g/j Tube Replacement    Result Date: 01/10/2017    Sinogram of the old gastrostomy tube tract allowing recanalization and redilation with eventual replacement of 20 French MIC gastrostomy catheter in good position the stomach. The new catheter may be used immediately. Dara Lords, MD 01/10/2017 11:05 AM      Signed by: Ledora Bottcher M.D.  Spectra Link: [id number]  Office Phone Number : [phone]

## 2017-01-26 NOTE — Progress Notes (Signed)
Looks better  abd soft  Min out of g tube  Ct scan no real obstruction to my read  Ok to start low dose tf

## 2017-01-26 NOTE — Progress Notes (Signed)
Paged Dr. Benay Pike this morning about the CT scan result of the abdomen pelvis, awaiting for his call-back. Incoming nurse notified.

## 2017-01-27 LAB — BASIC METABOLIC PANEL
Anion Gap: 8 (ref 5.0–15.0)
BUN: 30 mg/dL — ABNORMAL HIGH (ref 9.0–28.0)
CO2: 25 mEq/L (ref 22–29)
Calcium: 8.4 mg/dL (ref 7.9–10.2)
Chloride: 109 mEq/L (ref 100–111)
Creatinine: 1 mg/dL (ref 0.7–1.3)
Glucose: 105 mg/dL — ABNORMAL HIGH (ref 70–100)
Potassium: 3.7 mEq/L (ref 3.5–5.1)
Sodium: 142 mEq/L (ref 136–145)

## 2017-01-27 LAB — CBC AND DIFFERENTIAL
Absolute NRBC: 0 10*3/uL
Basophils Absolute Automated: 0.01 10*3/uL (ref 0.00–0.20)
Basophils Automated: 0.1 %
Eosinophils Absolute Automated: 0.13 10*3/uL (ref 0.00–0.70)
Eosinophils Automated: 1.1 %
Hematocrit: 30.5 % — ABNORMAL LOW (ref 42.0–52.0)
Hgb: 9.3 g/dL — ABNORMAL LOW (ref 13.0–17.0)
Immature Granulocytes Absolute: 0.11 10*3/uL — ABNORMAL HIGH
Immature Granulocytes: 0.9 %
Lymphocytes Absolute Automated: 0.89 10*3/uL (ref 0.50–4.40)
Lymphocytes Automated: 7.5 %
MCH: 26.2 pg — ABNORMAL LOW (ref 28.0–32.0)
MCHC: 30.5 g/dL — ABNORMAL LOW (ref 32.0–36.0)
MCV: 85.9 fL (ref 80.0–100.0)
Monocytes Absolute Automated: 0.93 10*3/uL (ref 0.00–1.20)
Monocytes: 7.8 %
Neutrophils Absolute: 9.79 10*3/uL — ABNORMAL HIGH (ref 1.80–8.10)
Neutrophils: 82.6 %
Nucleated RBC: 0 /100 WBC (ref 0.0–1.0)
Platelets: 69 10*3/uL — ABNORMAL LOW (ref 140–400)
RBC: 3.55 10*6/uL — ABNORMAL LOW (ref 4.70–6.00)
RDW: 17 % — ABNORMAL HIGH (ref 12–15)
WBC: 11.86 10*3/uL — ABNORMAL HIGH (ref 3.50–10.80)

## 2017-01-27 LAB — HEMOLYSIS INDEX: Hemolysis Index: 12 (ref 0–18)

## 2017-01-27 LAB — GFR: EGFR: >60

## 2017-01-27 LAB — GLUCOSE WHOLE BLOOD - POCT: Whole Blood Glucose POCT: 125 mg/dL — ABNORMAL HIGH (ref 70–100)

## 2017-01-27 LAB — PROCALCITONIN: Procalcitonin: 0.1 (ref 0.0–0.1)

## 2017-01-27 NOTE — Plan of Care (Signed)
Problem: Compromised Tissue integrity  Goal: Damaged tissue is healing and protected  Outcome: Progressing   01/27/17 0112   Goal/Interventions addressed this shift   Damaged tissue is healing and protected  Monitor/assess Braden scale every shift;Reposition patient every 2 hours and as needed unless able to reposition self;Relieve pressure to bony prominences for patients at moderate and high risk;Keep intact skin clean and dry;Use bath wipes, not soap and water, for daily bathing;Avoid shearing injuries;Provide wound care per wound care algorithm;Monitor external devices/tubes for correct placement to prevent pressure, friction and shearing;Encourage use of lotion/moisturizer on skin;Monitor patient's hygiene practices;Utilize specialty bed;Consult/collaborate with wound care nurse   Patient is repositioned every 4 hours, skin kept clean and dry, bony prominences supported with pillows to maintain skin integrity. Hourly rounding completed and patient's needs were met. Will continue to monitor.

## 2017-01-27 NOTE — Progress Notes (Signed)
[facility]  INTERNAL MEDICINE PROGRESS NOTE    Date Time: 01/27/17 9:52 AM  Patient Name: OZHYQMV,HQIO GRADY      Problem List:      Thrombocytopenia   Bowel Obstruction vs. Ileus- resolved   Nausea and Vomiting - resolved   Urine culture-MDR Proteus mirabilis-Status post 5 days course of treatment (gentamicin and Zosyn/ Urine culture/Repeat nonsignificant bacteria   Pneumoniawith Bibasilar consolidation( sputum culture with Serrata &Pseudomonas)   Leukocytosis   Malfunction G-tube/Replaced   Metabolic Encephalopathy   Acute on Chronic Respiratory Failure    Cardiomyopathy with last EF @40  percent   Hypertension   Chronic Atrial Fibrillation on [facility]     Plan:      Will stop Zosyn as he is developing thrombocytopenia, follow platelates   Continue with Tube Feeding @ 10 ml /hr, monitor residual and advance as tolerated   Continue IVF hydration, decrease rates if he tolerates tube feeding   Continue Cardizem and titrate down if his rate is better and continue amiodarone   DVT prophylaxis, on xareo   Discussed plan of care with patient   Discussed plan of care with nurses..     Subjective:     Recardo had been doing better. He has no vomiting and his obstruction has improve. He was able to   Tolerate tube feeding. His platelates count is dropping and will monitor for bleeding.     Medications:   Medications:   Scheduled Meds: PRN Meds:        albuterol-ipratropium 3 mL Nebulization Q6H   amiodarone 200 mg Oral Daily   atorvastatin 20 mg per G tube QHS   balsam peru-castor oil (VENELEX)  Topical Q12H   famotidine 20 mg Intravenous Q12H SCH   glycopyrrolate 1 mg per G tube Daily   lactobacillus/streptococcus 1 capsule per G tube Daily   metoclopramide 5 mg per G tube Q8H   metoprolol tartrate 50 mg per G tube BID   piperacillin-tazobactam 4.5 g Intravenous Q8H   QUEtiapine 25 mg Oral QHS   rivaroxaban 20 mg per G tube Daily with dinner   scopolamine 1 patch Transdermal Q72H          Continuous Infusions:     . dextrose 5 % and 0.9% NaCl 75 mL/hr at 01/27/17 0531   . dilTIAZem 5 mg/hr (01/26/17 2255)          acetaminophen 650 mg Q4H PRN   Or     acetaminophen 650 mg Q4H PRN   naloxone 0.2 mg PRN   ondansetron 4 mg Q6H PRN   Or     ondansetron 4 mg Q6H PRN   oxyCODONE-acetaminophen 1 tablet Q6H PRN   promethazine 6.25 mg Q6H PRN   senna 17.2 mg QD PRN   sodium chloride 200 mL Q12H PRN           Physical Exam:     VITAL SIGNS   Temp:  [97.1 F (36.2 C)-99 F (37.2 C)] 97.1 F (36.2 C)  Heart Rate:  [67-114] 67  Resp Rate:  [16-25] 20  BP: (107-120)/(59-77) 107/60  FiO2:  [30 %] 30 %  POCT Glucose Result (Read Only)  Avg: 119.3  Min: 82  Max: 154  SpO2: 100 %    Intake/Output Summary (Last 24 hours) at 01/27/17 0952  Last data filed at 01/27/17 0700   Gross per 24 hour   Intake             2365  ml   Output                0 ml   Net             2365 ml         General: Awake, alert, in no acute distress.   Heent: pinkish conjunctiva, anicteric sclera, moist mucus membrane   Neck : tracheostomy in situ and on vent    Cvs: S1 & S 2 well heard, regular rate and rhythm   Chest: Clear to auscultation   Abdomen: mildly distended, non-tender, hypertympanitic to percussion, active bowel sounds.   Ext : no cyanosis, trace edema   CNS: Alert, follows command,    Laboratory Results:     CHEMISTRY:     Recent Labs  Lab 01/27/17  0438 01/26/17  0436 01/25/17  0331 01/23/17  0428 01/21/17  0345   Glucose 105* 110* 142* 115* 108*   BUN 30.0* 38.0* 37.0* 20.0 26.0   Creatinine 1.0 1.0 1.3 0.7 0.7   Calcium 8.4 8.4 9.4 8.5 8.2   Sodium 142 140 139 138 139   Potassium 3.7 3.4* 4.3 3.7 4.0   Chloride 109 105 101 104 104   CO2 25 26 25 26 27        HEMATOLOGY:    Recent Labs  Lab 01/27/17  0438 01/26/17  0436 01/25/17  0331 01/23/17  0428 01/21/17  0345   WBC 11.86* 13.81* 19.54* 12.49* 11.26*   Hgb 9.3* 9.6* 11.0* 8.9* 8.7*   Hematocrit 30.5* 31.0* 35.1* 28.3* 27.9*   MCV 85.9 84.0 83.0 83.5 84.5   MCH  26.2* 26.0* 26.0* 26.3* 26.4*   MCHC 30.5* 31.0* 31.3* 31.4* 31.2*   Platelets 69* 97* 207 111* 96*       MICROBIOLOGY:  Microbiology Results     Procedure Component Value Units Date/Time    Bacterial ID & Sens Non-Urine Isolate [[id number]] Collected:  01/08/17 1029     Updated:  01/12/17 1334    Narrative:       [id number] called Micro Results of_pos bld. Results read back [id number], by [id number]  on 01/10/2017 at 00:36  Source: Blood  Test code: 95621H  Submitted: BAP  Send isolate slant    Blood Culture Aerobic/Anaerobic #1 [[id number]] Collected:  01/08/17 1029    Specimen:  Arm from Blood Updated:  01/13/17 1338    Narrative:       [id number] called Micro Results of_pos bld. Results read back [id number], by [id number]  on 01/10/2017 at 00:36  Source: Blood  Test code: 01027O  Submitted: BAP  ORDER#: [id number]                                    ORDERED BY: Lehman Prom  SOURCE: Blood arm                                    COLLECTED:  01/08/17 10:29  ANTIBIOTICS AT COLL.:                                RECEIVED :  01/08/17 15:23  [id number] called Micro Results of_pos bld. Results read back [id number], by [id number] on 01/10/2017 at 00:36  Culture Blood Aerobic and Anaerobic  PRELIM      01/13/17 13:38   +  01/09/17   No Growth after 1 day/s of incubation.  01/10/17   Anaerobic Blood Culture Positive in 24 to 48 hours             Gram Stain Shows: Gram positive rods             Identification to follow  01/12/17   Aerobic Blood Culture Positive after 4 days             Gram Stain Shows: Gram positive rods             Identification to follow  01/11/17   Growth of Gram-positive rod               Identification to follow             Isolate sent to reference lab        Blood Culture Aerobic/Anaerobic #2 [[id number]] Collected:  01/08/17 1029    Specimen:  Arm from Blood Updated:  01/13/17 1552    Narrative:       [id number] called Micro Results of_pos bld. Results read back [id number], by [id number]  on 01/09/2017 at 21:45  ORDER#: [id number]                                     ORDERED BY: Lehman Prom  SOURCE: Blood arm                                    COLLECTED:  01/08/17 10:29  ANTIBIOTICS AT COLL.:                                RECEIVED :  01/08/17 15:23  [id number]_ called Micro Results of_pos bld. Results read back [id number], by [id number] on 01/09/2017 at 21:45  Culture Blood Aerobic and Anaerobic        PRELIM      01/13/17 15:52   +  01/09/17   Anaerobic Blood Culture Positive in 24 to 48 hours             Gram Stain Shows: Gram positive rods  01/11/17   Aerobic culture no growth to date, final report to follow  01/13/17   Aerobic Blood Culture No Growth  01/11/17   Growth of Gram-positive rod               Identification to follow        CULTURE BLOOD AEROBIC AND ANAEROBIC [[id number]] Collected:  01/20/17 2115    Specimen:  Blood, Venipuncture Updated:  01/24/17 0421    Narrative:       ORDER#: [id number]                                    ORDERED BY: Gearldine Shown  SOURCE: Blood, Venipuncture arm                      COLLECTED:  01/20/17 21:15  ANTIBIOTICS AT COLL.:                                RECEIVED :  01/21/17 03:50  Culture Blood Aerobic and Anaerobic        PRELIM      01/24/17 04:21  01/22/17   No Growth after 1 day/s of incubation.  01/23/17   No Growth after 2 day/s of incubation.  01/24/17   No Growth after 3 day/s of incubation.      CULTURE BLOOD AEROBIC AND ANAEROBIC [[id number]] Collected:  01/13/17 1646    Specimen:  Blood, Venipuncture Updated:  01/18/17 2221    Narrative:       ORDER#: [id number]                                    ORDERED BY: CHUTUAPE, ARTHU  SOURCE: Blood, Venipuncture peripheral               COLLECTED:  01/13/17 16:46  ANTIBIOTICS AT COLL.:                                RECEIVED :  01/13/17 20:15  Culture Blood Aerobic and Anaerobic        FINAL       01/18/17 22:21  01/18/17   No growth after 5 days of incubation.      MRSA Culture [[id number]] Collected:  01/08/17 1029    Specimen:  Body Fluid from Nasal/Throat ASC  Admission Updated:  01/09/17 1653    Narrative:       ORDER#: [id number]                                    ORDERED BY: Lehman Prom  SOURCE: Nares and Throat                             COLLECTED:  01/08/17 10:29  ANTIBIOTICS AT COLL.:                                RECEIVED :  01/08/17 15:48  Culture MRSA Surveillance                  FINAL       01/09/17 16:53  01/09/17   Negative for Methicillin Resistant Staph aureus from Nares and             Negative for Methicillin Resistant Staph aureus from Throat      MRSA culture [[id number]] Collected:  01/09/17 0423    Specimen:  Body Fluid from Nasal/Throat ASC Admission Updated:  01/11/17 0950    Narrative:       ORDER#: [id number]                                    ORDERED BY: AL KHOURI, SAMI  SOURCE: Nares and Throat                             COLLECTED:  01/09/17 04:23  ANTIBIOTICS AT COLL.:                                RECEIVED :  01/09/17 10:27  Culture MRSA Surveillance                  FINAL       01/11/17 09:50   +  01/11/17   Positive for Methicillin Resistant Staph aureus from Nares             Negative for Methicillin Resistant Staph aureus from Throat      Sputum Culture [[id number]] Collected:  01/08/17 1129    Specimen:  Sputum from Tracheal Aspirate Updated:  01/11/17 1448    Narrative:       ORDER#: [id number]                                    ORDERED BY: Lehman Prom  SOURCE: Tracheal Aspirate trach                      COLLECTED:  01/08/17 11:29  ANTIBIOTICS AT COLL.:                                RECEIVED :  01/08/17 16:01  Stain, Gram (Respiratory)                  FINAL       01/08/17 17:23  01/08/17   Few WBC's             Rare WBC's             Moderate Mixed Respiratory Flora  Culture and Gram Stain, Aerobic, RespiratorFINAL       01/11/17 14:48   +  01/09/17   Heavy growth of mixed upper respiratory flora  01/11/17   Heavy growth of Serratia marcescens               Morganella, Serratia, Providencia, Hafnia, Citrobacter, and              Enterobacter species may develop resistance while on therapy             with B-lactam antibiotics, even if initially susceptible, due             to derepression of AmpC B-lactamase. CLSI M100-S24,             [place] [facility] June 2015    01/11/17   Heavy growth of Pseudomonas aeruginosa      _____________________________________________________________________________                                  S.marcescens    P.aeruginosa    ANTIBIOTICS                     MIC  INTRP      MIC  INTRP      _____________________________________________________________________________  Amikacin                                        <=8    S        Amoxicillin/CA                 >16/8   R  Ampicillin                      >16    R                        Aztreonam                       <=2    S         4     S        Cefazolin                       >16    R                        Cefepime                        <=1    S         2     S        Ceftazidime                     <=2    S        <=2    S        Ceftriaxone                     <=1    S                        Ciprofloxacin                    2     I         2     I        Ertapenem                     <=0.25   S                        Gentamicin                      <=2    S        <=2    S        Levofloxacin                    <=1    S         4     I        Meropenem                     <=0.25   S         8     R        Piperacillin/Tazobactam        <=4/4   S        4/4    S        Tetracycline                    >8     R  Tobramycin                                      <=2    S        _____________________________________________________________________________            S=SUSCEPTIBLE     I=INTERMEDIATE     R=RESISTANT                            N/S=NON-SUSCEPTIBLE  _____________________________________________________________________________      Urine culture [[id number]] Collected:  01/19/17 1748     Specimen:  Urine from Urine, Catheterized, In & Out Updated:  01/20/17 1946    Narrative:       ORDER#: [id number]                                    ORDERED BY: Oval Linsey  SOURCE: Urine, Catheterized, In & Out                COLLECTED:  01/19/17 17:48  ANTIBIOTICS AT COLL.:                                RECEIVED :  01/19/17 21:45  Culture Urine                              FINAL       01/20/17 19:46  01/20/17   No growth of >1,000 CFU/ML, No further work      Urine culture [[id number]] Collected:  01/08/17 1129    Specimen:  Urine from Urine, Catheterized, In & Out Updated:  01/10/17 1517    Narrative:       ORDER#: [id number]                                    ORDERED BY: Lehman Prom  SOURCE: Urine, Catheterized, In & Out                COLLECTED:  01/08/17 11:29  ANTIBIOTICS AT COLL.:                                RECEIVED :  01/08/17 15:48  Culture Urine                              FINAL       01/10/17 15:17   +  01/10/17   >100,000 CFU/ML MDR Proteus mirabilis               Cefazolin results predict results for the oral agents             cefuroxime axetil, cephalexin, cefdinir, cefpodoxime, and             cefprozil when used for therapy of uncomplicated UTIs due to             E.coli, K. pneumonia, and P. mirabilis. CLSI M100 S25             This multidrug resistant (MDR) Enterobacteriaceae  is resistant             to ceftriaxone and may not respond optimally to B-lactam             antibiotics (excluding carbapenems).             [place] [facility] Antim[facility]committee June 2015    _____________________________________________________________________________                                MDR P.mirabilis   ANTIBIOTICS                     MIC  INTRP      _____________________________________________________________________________  Amikacin                        >32    R        Amoxicillin/CA                 <=4/2   S        Ampicillin                      >16    R        Aztreonam                        >16    R        Cefazolin                       >16    R        Cefepime                        >16    R        Ceftazidime                     >16    R        Ceftriaxone                     >32    R        Ciprofloxacin                   >2     R        Ertapenem                     <=0.25   S        Gentamicin                      >8     R        Levofloxacin                    >4     R        Nitrofurantoin                  >64    R  D1    Piperacillin/Tazobactam        <=2/4   S        Tetracycline                    >8  R        Tobramycin                      >8     R        Trimethoprim/Sulfamethoxazole  >2/38   R          -----DRUG COMMENTS----------    D1:  Nitrofurantoin should only be used for the treatment of         uncomplicated cystitis.         [place] [facility] June 2015  _____________________________________________________________________________            S=SUSCEPTIBLE     I=INTERMEDIATE     R=RESISTANT                            N/S=NON-SUSCEPTIBLE  _____________________________________________________________________________              Radiology Results:   Radiological Procedure reviewed.  Ct Head Wo Contrast    Result Date: 01/08/2017    No acute intracranial abnormality. Wyatt Portela, MD 01/08/2017 11:16 AM    Ct Abdomen Pelvis W Iv Contrast Only    Result Date: 01/26/2017  1. Improving small bowel obstruction. 2. Inflammatory changes adjacent to the second portion of the duodenum favoring duodenitis. 3. Bilateral pleural effusions and bibasilar atelectasis. Right middle lobe infiltrate is not excluded. 4. Cholelithiasis. 5. Right nephrolithiasis. 6. Additional chronic findings as above. Jorene Guest, MD 01/26/2017 12:16 AM    Xr Chest Ap Portable    Result Date: 01/24/2017   Stable chest Gaynelle Arabian, MD 01/24/2017 11:07 AM    Xr Chest Ap Portable    Result Date: 01/21/2017   Worsening pulmonary edema pattern with underlying worsening small to moderate bilateral  pleural effusions Laurena Slimmer, MD 01/21/2017 8:00 AM    Xr Chest Ap Portable    Result Date: 01/19/2017  1. Stable small layering bilateral pleural effusions. 2. Consolidation throughout the retrocardiac region which may be secondary to layering pleural fluid, subsegmental atelectatic changes and/or infiltrates.  Fonnie Mu, MD 01/19/2017 12:57 PM    Xr Chest Ap Portable    Result Date: 01/10/2017   Technically limited portable study. Increased bilateral pleural effusions and bibasilar opacification. Heron Nay, MD 01/10/2017 8:01 AM    Xr Chest  Ap Portable    Result Date: 01/08/2017    Interval improvement of the vascular congestion and pulmonary edema and decrease in size of the pleural effusions. Heron Nay, MD 01/08/2017 11:29 AM    Xr Abdomen Portable    Result Date: 01/24/2017   Multiple dilated small bowel loops consistent with mechanical obstruction or ileus. Gustavus Messing, MD 01/24/2017 5:10 PM    G,j,g/j Tube Replacement    Result Date: 01/10/2017    Sinogram of the old gastrostomy tube tract allowing recanalization and redilation with eventual replacement of 20 French MIC gastrostomy catheter in good position the stomach. The new catheter may be used immediately. Dara Lords, MD 01/10/2017 11:05 AM      Signed by: Ledora Bottcher M.D.  Spectra Link: [id number]  Office Phone Number : [phone]

## 2017-01-27 NOTE — Plan of Care (Signed)
Problem: Safety  Goal: Patient will be free from injury during hospitalization   01/27/17 1202   Goal/Interventions addressed this shift   Patient will be free from injury during hospitalization  Assess patient's risk for falls and implement fall prevention plan of care per policy;Provide and maintain safe environment;Include patient/ family/ care giver in decisions related to safety;Hourly rounding;Assess for patients risk for elopement and implement Elopement Risk Plan per policy;Ensure appropriate safety devices are available at the bedside       Problem: Hemodynamic Status: Cardiac  Goal: Stable vital signs and fluid balance   01/27/17 1202   Goal/Interventions addressed this shift   Stable vital signs and fluid balance Monitor/assess vital signs and telemetry per unit protocol;Assess signs and symptoms associated with cardiac rhythm changes;Monitor intake/output per unit protocol and/or LIP order;Monitor lab values;Monitor for leg swelling/edema and report to LIP if abnormal

## 2017-01-27 NOTE — Progress Notes (Signed)
In[facility] Diseases [facility]  Progress Note    01/27/2017   [name] ZOX:[id number],XBJ:[id number] is a 83 y.o. male,       Assessment:      Small bowel obstruction improving   Encephalopathy.   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   Status post tracheostomy and G-tube placement.   Resident of [facility]    Plan:      Continue Zosyn.   Continue probiotics.   Pro-calcitonin level   Monitor electrolytes and renal functions closely.   Monitor clinically .    ROS:     General: No fever, no chills, no rigor,comfortable  Respiratory: no cough, shortness of breath, or wheezing, Tracheostomy in place   Gastrointestinal: no diarrhea  Genito-Urinary: no hematuria  Musculoskeletal: no edema  Neurological: Awake  Dermatological: no rash    Physical Examination:     Blood pressure 107/60, pulse 67, temperature 97.1 F (36.2 C), temperature source Axillary, resp. rate 20, height 1.854 m (6' 0.99"), weight 103.9 kg (229 lb), SpO2 100 %.     General Appearance: Comfortable, and in no acute distress.   HEENT: Pupils are equal, round, and reactive to light.    Lungs: Decreased breath sounds   Heart: Regular rate and rhythm   Chest: Symmetric chest wall expansion.    Abdomen: Soft ,non tender,no hepatosplenomegaly,good bowel sounds   Neurological: Paraplegia   Extremities: No edema    Laboratory And Diagnostic Studies:     Recent Labs      01/27/17   0438  01/26/17   0436   WBC  11.86*  13.81*   Hgb  9.3*  9.6*   Hematocrit  30.5*  31.0*   Platelets  69*  97*     Recent Labs      01/27/17   0438  01/26/17   0436   Sodium  142  140   Potassium  3.7  3.4*   Chloride  109  105   CO2  25  26   BUN  30.0*  38.0*    Creatinine  1.0  1.0   Glucose  105*  110*   Calcium  8.4  8.4     No results for input(s): AST, ALT, ALKPHOS, PROT, ALB in the last 72 hours.    Current Meds:      Scheduled Meds: PRN Meds:        albuterol-ipratropium 3 mL Nebulization Q6H   amiodarone 200 mg Oral Daily   atorvastatin 20 mg per G tube QHS   balsam peru-castor oil (VENELEX)  Topical Q12H   famotidine 20 mg Intravenous Q12H SCH   glycopyrrolate 1 mg per G tube Daily   lactobacillus/streptococcus 1 capsule per G tube Daily   metoclopramide 5 mg per G tube Q8H   metoprolol tartrate 50 mg per G tube BID   piperacillin-tazobactam 4.5 g Intravenous Q8H   QUEtiapine 25 mg Oral QHS   rivaroxaban 20 mg per G tube Daily with dinner   scopolamine 1 patch Transdermal Q72H       Continuous Infusions:  . dextrose 5 % and 0.9% NaCl 75 mL/hr at 01/27/17 0531   . dilTIAZem 5 mg/hr (01/26/17 2255)      acetaminophen 650  mg Q4H PRN   Or     acetaminophen 650 mg Q4H PRN   naloxone 0.2 mg PRN   ondansetron 4 mg Q6H PRN   Or     ondansetron 4 mg Q6H PRN   oxyCODONE-acetaminophen 1 tablet Q6H PRN   promethazine 6.25 mg Q6H PRN   senna 17.2 mg QD PRN   sodium chloride 200 mL Q12H PRN         [name], M.D.  01/27/2017  9:40 AM

## 2017-01-28 ENCOUNTER — Encounter (INDEPENDENT_AMBULATORY_CARE_PROVIDER_SITE_OTHER): Payer: Self-pay

## 2017-01-28 LAB — GLUCOSE WHOLE BLOOD - POCT
Whole Blood Glucose POCT: 102 mg/dL — ABNORMAL HIGH (ref 70–100)
Whole Blood Glucose POCT: 104 mg/dL — ABNORMAL HIGH (ref 70–100)
Whole Blood Glucose POCT: 115 mg/dL — ABNORMAL HIGH (ref 70–100)
Whole Blood Glucose POCT: 113 mg/dL — ABNORMAL HIGH (ref 70–100)

## 2017-01-28 LAB — BASIC METABOLIC PANEL
Anion Gap: 7 (ref 5.0–15.0)
BUN: 23 mg/dL (ref 9.0–28.0)
CO2: 25 mEq/L (ref 22–29)
Calcium: 8.4 mg/dL (ref 7.9–10.2)
Chloride: 112 mEq/L — ABNORMAL HIGH (ref 100–111)
Creatinine: 0.8 mg/dL (ref 0.7–1.3)
Glucose: 100 mg/dL (ref 70–100)
Potassium: 3.3 mEq/L — ABNORMAL LOW (ref 3.5–5.1)
Sodium: 144 mEq/L (ref 136–145)

## 2017-01-28 LAB — CBC AND DIFFERENTIAL
Absolute NRBC: 0 10*3/uL
Basophils Absolute Automated: 0.01 10*3/uL (ref 0.00–0.20)
Basophils Automated: 0.1 %
Eosinophils Absolute Automated: 0.16 10*3/uL (ref 0.00–0.70)
Eosinophils Automated: 1.5 %
Hematocrit: 30.1 % — ABNORMAL LOW (ref 42.0–52.0)
Hgb: 9.1 g/dL — ABNORMAL LOW (ref 13.0–17.0)
Immature Granulocytes Absolute: 0.09 10*3/uL — ABNORMAL HIGH
Immature Granulocytes: 0.9 %
Lymphocytes Absolute Automated: 0.89 10*3/uL (ref 0.50–4.40)
Lymphocytes Automated: 8.6 %
MCH: 26 pg — ABNORMAL LOW (ref 28.0–32.0)
MCHC: 30.2 g/dL — ABNORMAL LOW (ref 32.0–36.0)
MCV: 86 fL (ref 80.0–100.0)
Monocytes Absolute Automated: 0.86 10*3/uL (ref 0.00–1.20)
Monocytes: 8.3 %
Neutrophils Absolute: 8.32 10*3/uL — ABNORMAL HIGH (ref 1.80–8.10)
Neutrophils: 80.6 %
Nucleated RBC: 0 /100 WBC (ref 0.0–1.0)
Platelets: 68 10*3/uL — ABNORMAL LOW (ref 140–400)
RBC: 3.5 10*6/uL — ABNORMAL LOW (ref 4.70–6.00)
RDW: 17 % — ABNORMAL HIGH (ref 12–15)
WBC: 10.33 10*3/uL (ref 3.50–10.80)

## 2017-01-28 LAB — GFR: EGFR: >60

## 2017-01-28 LAB — BACTERIAL ID AND SENSITIVITY, NON-URINE ISOLATE

## 2017-01-28 LAB — HEMOLYSIS INDEX: Hemolysis Index: 6 (ref 0–18)

## 2017-01-28 MED ORDER — POTASSIUM CHLORIDE 20 MEQ PO PACK
40.0000 meq | PACK | Freq: Once | ORAL | Status: AC
Start: 2017-01-28 — End: 2017-01-28
  Administered 2017-01-28: 17:00:00 40 meq via ORAL
  Filled 2017-01-28: qty 2

## 2017-01-28 NOTE — Progress Notes (Signed)
Nutritional Support Services  Nutrition Follow-up    [name] 83 y.o. male   MRN: [id number]    Summary of Nutrition Recommendations:  1. Increase TF toPromote via PEG @ goal rate of 60 mls/hr.   Provides 1440 total kcals, 90 gm protein, and 1208 mls free water in 1440 total mls    2. Restart 3 packets Prosource daily  Each packet Prosource provides 15 gm protein and 60 kcal.    EN + Prosource provides 1620 total kcals, 135 gm protein, and 1208 mls free water in 1440 total mls.  Meets 100% estimated kcal and protein needs.     3. Flush tube with minimum of 30-50 ml Q 4 hours for tube patency- additional fluids for hydration per MD    4. Continue weight monitoring- no new weight since 2/10    5. Monitor bowel movement frequency - pt with BM 2/16      Recommendations discussed with RN.  -----------------------------------------------------------------------------------------------------------------                                                           ASSESSMENT DATA     Subjective Nutrition: F/U to RD assessment 2/16 for TF. TF stopped due to residuals and restarted over weekend, only at 10 ml/hr currently, no residuals this AM per RN.  Events of Current Admission:  Pt admitted for AMS. Pt remains on vent/trach.   Medical Hx:  has a past medical history of C2 cervical fracture; Chronic pain; Conjunctivitis; Constipation; Cutaneous abscess of buttock; Dysphagia; Hypertension; Insomnia; Kidney disorder; and Respiratory arrest.   Diet/Social Hx: from [place]    Current Diet Order      Diet NPO effective now Except for: OTHER (SEE COMMENTS) (Except for meds)  Enteral: Promote at 10 ml/hr   -only provides 240 kcals, 15 gm protein    ANTHROPOMETRIC  Anthropometrics  Height: 185.4 cm (6' 0.99")  Weight: 103.9 kg (229 lb)  IBW/kg (Calculated) Male: 83.65 kg  BMI (calculated): 29.5  No new weight    Physical Assessment:   Unable to re-evaluate NFPE due to pt  sleeping, no signs of subcutaneous fat or muscle loss and noted no signs of malnutrition identified during nutrition focused physical examination 1/31.  Edema: generalized  Skin: sacral skin tear  GI function: distended, last BM 2/16    ESTIMATED NEEDS  Estimated Energy Needs  Total Energy Estimated Needs: 6213-0865 kcals/day  Method for Estimating Needs: 15-20 kcals/kg ABW of 97.8kg (BMI 30, tele pt)    Estimated Protein Needs  Total Protein Estimated Needs: 122-166 gm/day  Method for Estimating Needs: 1.5-2.0 gm/kg IBW of 83kg (BMI 30, tele pt, stage 2 wound)    Fluid Needs  Method for Estimating Needs: 1 ml/kcal or per MD    Pertinent Medications: Reglan, Risaquad  IVF:    . dextrose 5 % and 0.9% NaCl 75 mL/hr at 01/28/17 0600   . dilTIAZem 5 mg/hr (01/28/17 0600)       Pertinent labs: (2/19) K 3.3 Low     Learning Needs: not at this time  NUTRITION DIAGNOSIS     Inadequate oral intake related to respiratory failure as evidenced by mechanical ventilation/NPO and current TF order not meeting nutritional needs.      Will continue to monitor weight hx, pump infusion hx, and results of nutrition focused physical examinations for 2 qualifying criteria for malnutrition.                                                            INTERVENTION     Nutrition recommendation -   1. Increase TF toPromote via PEG @ goal rate of 60 mls/hr.   Provides 1440 total kcals, 90 gm protein, and 1208 mls free water in 1440 total mls    2. Restart 3 packets Prosource daily  Each packet Prosource provides 15 gm protein and 60 kcal.    EN + Prosource provides 1620 total kcals, 135 gm protein, and 1208 mls free water in 1440 total mls.  Meets 100% estimated kcal and protein needs.     3. Flush tube with minimum of 30-50 ml Q 4 hours for tube patency- additional fluids for hydration per MD    4. Continue weight monitoring- no new  weight since 2/10    5. Monitor bowel movement frequency - pt with BM 2/16    Goal:Pt will tolerate >80% TF goal rate x 24-48 hrs (Ongoing)                                                         MONITORING     Monitor EN tolerance, labs, GI function, and weight.                                                      EVALUATION   Goal: not met, ongoing    Nutrition Risk Level: Moderate (will follow up within 7 days and PRN)       Mauri Reading, RD  Nutrition Office [phone]

## 2017-01-28 NOTE — Progress Notes (Signed)
TCM Medicare Navigator    Patient currently admitted to [facility] under inpatient status. Medicare Focus Navigator will continue to monitor patient status for 30 days following index admission which occurred dates 12/14/2016 to 12/27/2016.      Keyunna Coco BS   TCM Medicare Focus navigator   [phone]

## 2017-01-28 NOTE — Plan of Care (Signed)
Problem: Inadequate Gas Exchange  Goal: Adequate oxygenation and improved ventilation   01/28/17 1657   Goal/Interventions addressed this shift   Adequate oxygenation and improved ventilation Assess lung sounds;Increase activity as tolerated/progressive mobility;Position for maximum ventilatory efficiency;Provide mechanical and oxygen support to facilitate gas exchange   Pt remains in Afib, rates controlled in 80s, Cardizen gtt discontinued. No residuals so tube feedings increased slowly, pt tolerating. Tolerating CPAP setting, continue to wean per respiratory therapist.

## 2017-01-28 NOTE — Progress Notes (Signed)
[facility]  INTERNAL MEDICINE PROGRESS NOTE    Date Time: 01/28/17 7:45 AM  Patient Name: [name],[name]      Problem List:      Thrombocytopenia   Bowel Obstruction vs. Ileus- resolved   Nausea and Vomiting - resolved   Urine culture-MDR Proteus mirabilis-Status post 5 days course of treatment (gentamicin and Zosyn/ Urine culture/Repeat nonsignificant bacteria   Pneumoniawith Bibasilar consolidation( sputum culture with Serrata &Pseudomonas)   Leukocytosis   Malfunction G-tube/Replaced   Metabolic Encephalopathy   Acute on Chronic Respiratory Failure    Cardiomyopathy with last EF @40  percent   Hypertension   Chronic Atrial Fibrillation on AC     Plan:      Platelate count has been stable and no drop since yesterday   Zosyn stopped and will watch off antibiotics   Increase Tube Feeding rate and advance as tolerated   Decrease IVF hydration  rates if he tolerates tube feeding   Stop Cardizem  and continue amiodarone   DVT prophylaxis, on xareo   Discussed plan of care with patient   Discussed plan of care with nurses..     Subjective:     Brandy had been doing better. He has no vomiting and his obstruction has improve.   He was able to tolerate tube feeding and will advance his diet.  He is alert today and communicating with writing.     Medications:   Medications:   Scheduled Meds: PRN Meds:        albuterol-ipratropium 3 mL Nebulization Q6H   amiodarone 200 mg Oral Daily   atorvastatin 20 mg per G tube QHS   balsam peru-castor oil (VENELEX)  Topical Q12H   famotidine 20 mg Intravenous Q12H SCH   glycopyrrolate 1 mg per G tube Daily   lactobacillus/streptococcus 1 capsule per G tube Daily   metoclopramide 5 mg per G tube Q8H   metoprolol tartrate 50 mg per G tube BID   QUEtiapine 25 mg Oral QHS   rivaroxaban 20 mg per G tube Daily with dinner   scopolamine 1 patch Transdermal Q72H         Continuous Infusions:     . dextrose 5 % and 0.9% NaCl 75 mL/hr at 01/28/17 0600   .  dilTIAZem 5 mg/hr (01/28/17 0600)          acetaminophen 650 mg Q4H PRN   Or     acetaminophen 650 mg Q4H PRN   naloxone 0.2 mg PRN   ondansetron 4 mg Q6H PRN   Or     ondansetron 4 mg Q6H PRN   oxyCODONE-acetaminophen 1 tablet Q6H PRN   promethazine 6.25 mg Q6H PRN   senna 17.2 mg QD PRN   sodium chloride 200 mL Q12H PRN           Physical Exam:     VITAL SIGNS   Temp:  [97.1 F (36.2 C)-98.6 F (37 C)] 97.7 F (36.5 C)  Heart Rate:  [67-73] 73  Resp Rate:  [18-20] 18  BP: (107-116)/(55-60) 116/55  FiO2:  [30 %] 30 %  POCT Glucose Result (Read Only)  Avg: 118.8  Min: 82  Max: 154  SpO2: 100 %    Intake/Output Summary (Last 24 hours) at 01/28/17 0745  Last data filed at 01/28/17 0600   Gross per 24 hour   Intake             1870 ml   Output  0 ml   Net             1870 ml         General: Awake, alert, in no acute distress.   Heent: pinkish conjunctiva, anicteric sclera, moist mucus membrane   Neck : tracheostomy in situ and on vent    Cvs: S1 & S 2 well heard, regular rate and rhythm   Chest: Clear to auscultation   Abdomen: mildly distended, non-tender, active bowel sounds.   Ext : no cyanosis, trace edema   CNS: Alert, follows command,    Laboratory Results:     CHEMISTRY:     Recent Labs  Lab 01/28/17  0424 01/27/17  0438 01/26/17  0436 01/25/17  0331 01/23/17  0428   Glucose 100 105* 110* 142* 115*   BUN 23.0 30.0* 38.0* 37.0* 20.0   Creatinine 0.8 1.0 1.0 1.3 0.7   Calcium 8.4 8.4 8.4 9.4 8.5   Sodium 144 142 140 139 138   Potassium 3.3* 3.7 3.4* 4.3 3.7   Chloride 112* 109 105 101 104   CO2 25 25 26 25 26        HEMATOLOGY:    Recent Labs  Lab 01/28/17  0424 01/27/17  0438 01/26/17  0436 01/25/17  0331 01/23/17  0428   WBC 10.33 11.86* 13.81* 19.54* 12.49*   Hgb 9.1* 9.3* 9.6* 11.0* 8.9*   Hematocrit 30.1* 30.5* 31.0* 35.1* 28.3*   MCV 86.0 85.9 84.0 83.0 83.5   MCH 26.0* 26.2* 26.0* 26.0* 26.3*   MCHC 30.2* 30.5* 31.0* 31.3* 31.4*   Platelets 68* 69* 97* 207 111*        MICROBIOLOGY:  Microbiology Results     Procedure Component Value Units Date/Time    Bacterial ID & Sens Non-Urine Isolate [[id number]] Collected:  01/08/17 1029     Updated:  01/12/17 1334    Narrative:       [id number] called Micro Results of_pos bld. Results read back [id number], by [id number]  on 01/10/2017 at 00:36  Source: Blood  Test code: 95621H  Submitted: BAP  Send isolate slant    Blood Culture Aerobic/Anaerobic #1 [[id number]] Collected:  01/08/17 1029    Specimen:  Arm from Blood Updated:  01/13/17 1338    Narrative:       [id number] called Micro Results of_pos bld. Results read back [id number], by [id number]  on 01/10/2017 at 00:36  Source: Blood  Test code: 01027O  Submitted: BAP  ORDER#: [id number]                                    ORDERED BY: Lehman Prom  SOURCE: Blood arm                                    COLLECTED:  01/08/17 10:29  ANTIBIOTICS AT COLL.:                                RECEIVED :  01/08/17 15:23  [id number] called Micro Results of_pos bld. Results read back [id number], by [phone] on 01/10/2017 at 00:36  Culture Blood Aerobic and Anaerobic        PRELIM      01/13/17 13:38   +  01/09/17   No Growth after 1 day/s of incubation.  01/10/17   Anaerobic Blood Culture Positive in 24 to 48 hours             Gram Stain Shows: Gram positive rods             Identification to follow  01/12/17   Aerobic Blood Culture Positive after 4 days             Gram Stain Shows: Gram positive rods             Identification to follow  01/11/17   Growth of Gram-positive rod               Identification to follow             Isolate sent to reference lab        Blood Culture Aerobic/Anaerobic #2 [[id number]] Collected:  01/08/17 1029    Specimen:  Arm from Blood Updated:  01/13/17 1552    Narrative:       [id number] called Micro Results of_pos bld. Results read back [id number], by [id number]  on 01/09/2017 at 21:45  ORDER#: [id number]                                    ORDERED BY: Lehman Prom  SOURCE: Blood arm                                     COLLECTED:  01/08/17 10:29  ANTIBIOTICS AT COLL.:                                RECEIVED :  01/08/17 15:23  [id number]_ called Micro Results of_pos bld. Results read back [id number], by [id number] on 01/09/2017 at 21:45  Culture Blood Aerobic and Anaerobic        PRELIM      01/13/17 15:52   +  01/09/17   Anaerobic Blood Culture Positive in 24 to 48 hours             Gram Stain Shows: Gram positive rods  01/11/17   Aerobic culture no growth to date, final report to follow  01/13/17   Aerobic Blood Culture No Growth  01/11/17   Growth of Gram-positive rod               Identification to follow        CULTURE BLOOD AEROBIC AND ANAEROBIC [[id number]] Collected:  01/20/17 2115    Specimen:  Blood, Venipuncture Updated:  01/24/17 0421    Narrative:       ORDER#: [id number]                                    ORDERED BY: Gearldine Shown  SOURCE: Blood, Venipuncture arm                      COLLECTED:  01/20/17 21:15  ANTIBIOTICS AT COLL.:                                RECEIVED :  01/21/17 03:50  Culture Blood Aerobic and Anaerobic        PRELIM  01/24/17 04:21  01/22/17   No Growth after 1 day/s of incubation.  01/23/17   No Growth after 2 day/s of incubation.  01/24/17   No Growth after 3 day/s of incubation.      CULTURE BLOOD AEROBIC AND ANAEROBIC [[id number]] Collected:  01/13/17 1646    Specimen:  Blood, Venipuncture Updated:  01/18/17 2221    Narrative:       ORDER#: [id number]                                    ORDERED BY: CHUTUAPE, ARTHU  SOURCE: Blood, Venipuncture peripheral               COLLECTED:  01/13/17 16:46  ANTIBIOTICS AT COLL.:                                RECEIVED :  01/13/17 20:15  Culture Blood Aerobic and Anaerobic        FINAL       01/18/17 22:21  01/18/17   No growth after 5 days of incubation.      MRSA Culture [[id number]] Collected:  01/08/17 1029    Specimen:  Body Fluid from Nasal/Throat ASC Admission Updated:  01/09/17 1653    Narrative:       ORDER#: [id number]                                     ORDERED BY: Lehman Prom  SOURCE: Nares and Throat                             COLLECTED:  01/08/17 10:29  ANTIBIOTICS AT COLL.:                                RECEIVED :  01/08/17 15:48  Culture MRSA Surveillance                  FINAL       01/09/17 16:53  01/09/17   Negative for Methicillin Resistant Staph aureus from Nares and             Negative for Methicillin Resistant Staph aureus from Throat      MRSA culture [[id number]] Collected:  01/09/17 0423    Specimen:  Body Fluid from Nasal/Throat ASC Admission Updated:  01/11/17 0950    Narrative:       ORDER#: [id number]                                    ORDERED BY: AL KHOURI, SAMI  SOURCE: Nares and Throat                             COLLECTED:  01/09/17 04:23  ANTIBIOTICS AT COLL.:                                RECEIVED :  01/09/17 10:27  Culture MRSA Surveillance  FINAL       01/11/17 09:50   +  01/11/17   Positive for Methicillin Resistant Staph aureus from Nares             Negative for Methicillin Resistant Staph aureus from Throat      Sputum Culture [[id number]] Collected:  01/08/17 1129    Specimen:  Sputum from Tracheal Aspirate Updated:  01/11/17 1448    Narrative:       ORDER#: [id number]                                    ORDERED BY: Lehman Prom  SOURCE: Tracheal Aspirate trach                      COLLECTED:  01/08/17 11:29  ANTIBIOTICS AT COLL.:                                RECEIVED :  01/08/17 16:01  Stain, Gram (Respiratory)                  FINAL       01/08/17 17:23  01/08/17   Few WBC's             Rare WBC's             Moderate Mixed Respiratory Flora  Culture and Gram Stain, Aerobic, RespiratorFINAL       01/11/17 14:48   +  01/09/17   Heavy growth of mixed upper respiratory flora  01/11/17   Heavy growth of Serratia marcescens               Morganella, Serratia, Providencia, Hafnia, Citrobacter, and             Enterobacter species may develop resistance while on therapy             with B-lactam antibiotics,  even if initially susceptible, due             to derepression of AmpC B-lactamase. CLSI M100-S24,             [facility] Antimicrobial Subcommittee June 2015    01/11/17   Heavy growth of Pseudomonas aeruginosa      _____________________________________________________________________________                                  S.marcescens    P.aeruginosa    ANTIBIOTICS                     MIC  INTRP      MIC  INTRP      _____________________________________________________________________________  Amikacin                                        <=8    S        Amoxicillin/CA                 >16/8   R                        Ampicillin                      >  16    R                        Aztreonam                       <=2    S         4     S        Cefazolin                       >16    R                        Cefepime                        <=1    S         2     S        Ceftazidime                     <=2    S        <=2    S        Ceftriaxone                     <=1    S                        Ciprofloxacin                    2     I         2     I        Ertapenem                     <=0.25   S                        Gentamicin                      <=2    S        <=2    S        Levofloxacin                    <=1    S         4     I        Meropenem                     <=0.25   S         8     R        Piperacillin/Tazobactam        <=4/4   S        4/4    S        Tetracycline                    >8     R                        Tobramycin                                      <=  2    S        _____________________________________________________________________________            S=SUSCEPTIBLE     I=INTERMEDIATE     R=RESISTANT                            N/S=NON-SUSCEPTIBLE  _____________________________________________________________________________      Urine culture [[id number]] Collected:  01/19/17 1748    Specimen:  Urine from Urine, Catheterized, In & Out Updated:  01/20/17 1946    Narrative:        ORDER#: [id number]                                    ORDERED BY: Oval Linsey  SOURCE: Urine, Catheterized, In & Out                COLLECTED:  01/19/17 17:48  ANTIBIOTICS AT COLL.:                                RECEIVED :  01/19/17 21:45  Culture Urine                              FINAL       01/20/17 19:46  01/20/17   No growth of >1,000 CFU/ML, No further work      Urine culture [[id number]] Collected:  01/08/17 1129    Specimen:  Urine from Urine, Catheterized, In & Out Updated:  01/10/17 1517    Narrative:       ORDER#: [id number]                                    ORDERED BY: Lehman Prom  SOURCE: Urine, Catheterized, In & Out                COLLECTED:  01/08/17 11:29  ANTIBIOTICS AT COLL.:                                RECEIVED :  01/08/17 15:48  Culture Urine                              FINAL       01/10/17 15:17   +  01/10/17   >100,000 CFU/ML MDR Proteus mirabilis               Cefazolin results predict results for the oral agents             cefuroxime axetil, cephalexin, cefdinir, cefpodoxime, and             cefprozil when used for therapy of uncomplicated UTIs due to             E.coli, K. pneumonia, and P. mirabilis. CLSI M100 S25             This multidrug resistant (MDR) Enterobacteriaceae is resistant             to ceftriaxone and may not respond optimally to B-lactam             antibiotics (excluding carbapenems).  [place] Antimicrobial Subcommittee June 2015    _____________________________________________________________________________                                MDR P.mirabilis   ANTIBIOTICS                     MIC  INTRP      _____________________________________________________________________________  Amikacin                        >32    R        Amoxicillin/CA                 <=4/2   S        Ampicillin                      >16    R        Aztreonam                       >16    R        Cefazolin                       >16    R        Cefepime                         >16    R        Ceftazidime                     >16    R        Ceftriaxone                     >32    R        Ciprofloxacin                   >2     R        Ertapenem                     <=0.25   S        Gentamicin                      >8     R        Levofloxacin                    >4     R        Nitrofurantoin                  >64    R  D1    Piperacillin/Tazobactam        <=2/4   S        Tetracycline                    >8     R        Tobramycin                      >8     R        Trimethoprim/Sulfamethoxazole  >2/38   R          -----  DRUG COMMENTS----------    D1:  Nitrofurantoin should only be used for the treatment of         uncomplicated cystitis.         [place] [facility] Antimicrobial Subcommittee June 2015  _____________________________________________________________________________            S=SUSCEPTIBLE     I=INTERMEDIATE     R=RESISTANT                            N/S=NON-SUSCEPTIBLE  _____________________________________________________________________________              Radiology Results:   Radiological Procedure reviewed.  Ct Head Wo Contrast    Result Date: 01/08/2017    No acute intracranial abnormality. Wyatt Portela, MD 01/08/2017 11:16 AM    Ct Abdomen Pelvis W Iv Contrast Only    Result Date: 01/26/2017  1. Improving small bowel obstruction. 2. Inflammatory changes adjacent to the second portion of the duodenum favoring duodenitis. 3. Bilateral pleural effusions and bibasilar atelectasis. Right middle lobe infiltrate is not excluded. 4. Cholelithiasis. 5. Right nephrolithiasis. 6. Additional chronic findings as above. Jorene Guest, MD 01/26/2017 12:16 AM    Xr Chest Ap Portable    Result Date: 01/24/2017   Stable chest Gaynelle Arabian, MD 01/24/2017 11:07 AM    Xr Chest Ap Portable    Result Date: 01/21/2017   Worsening pulmonary edema pattern with underlying worsening small to moderate bilateral pleural effusions Laurena Slimmer, MD 01/21/2017 8:00 AM    Xr Chest Ap Portable    Result Date:  01/19/2017  1. Stable small layering bilateral pleural effusions. 2. Consolidation throughout the retrocardiac region which may be secondary to layering pleural fluid, subsegmental atelectatic changes and/or infiltrates.  Fonnie Mu, MD 01/19/2017 12:57 PM    Xr Chest Ap Portable    Result Date: 01/10/2017   Technically limited portable study. Increased bilateral pleural effusions and bibasilar opacification. Heron Nay, MD 01/10/2017 8:01 AM    Xr Chest  Ap Portable    Result Date: 01/08/2017    Interval improvement of the vascular congestion and pulmonary edema and decrease in size of the pleural effusions. Heron Nay, MD 01/08/2017 11:29 AM    Xr Abdomen Portable    Result Date: 01/24/2017   Multiple dilated small bowel loops consistent with mechanical obstruction or ileus. Gustavus Messing, MD 01/24/2017 5:10 PM    G,j,g/j Tube Replacement    Result Date: 01/10/2017    Sinogram of the old gastrostomy tube tract allowing recanalization and redilation with eventual replacement of 20 French MIC gastrostomy catheter in good position the stomach. The new catheter may be used immediately. Dara Lords, MD 01/10/2017 11:05 AM      Signed by: Ledora Bottcher M.D.  Spectra Link: [id number]  Office Phone Number : [phone]

## 2017-01-28 NOTE — Progress Notes (Signed)
Pt left unit the with EMS transport with Niro and Heparin running;  Pt A&Ox4, VSS, stating above 95% on RM.

## 2017-01-28 NOTE — Progress Notes (Signed)
MCR Fx PNA    From [facility] vent unit. Per notes, obstruction improved, tolerating nutrient via feeding tube, increasing rate as tolerated. Conts on Cardizem drip.    Platelets dropped, being monitored for bleeding per MD note.    DCP return to [facility], transport via ambulance    CM to arrange ambulance when medically cleared    Referral updated to [facility]    Madie Reno, RN  Clinical Case Manager I  [phone]

## 2017-01-28 NOTE — Progress Notes (Signed)
Infectious Diseases & Tropical Medicine  Progress Note    01/28/2017   [name] ZOX:[id number],XBJ:[id number] is a 83 y.o. male,       Assessment:      Encephalopathy.   Risk of aspiration   CT scan of the abdomen-possible right middle lobe infiltrate (01/26/2017)   Pro-calcitonin level-0.1 (01/27/2017)   Lactobacillus bacteremia   Leukocytosis resolved   History of quadriparesis secondary to neck fracture.   Status post tracheostomy and G-tube placement.   Resident of [facility] nursing [facility]    Plan:      OK to monitor without antibiotics   Continue probiotics.   Monitor electrolytes and renal functions closely.   Monitor clinically .    ROS:     General: No fever, no chills, no rigor,comfortable,much more awake and alert today  Respiratory: no cough, shortness of breath, or wheezing, Tracheostomy in place   Gastrointestinal: no diarrhea  Genito-Urinary: no hematuria  Musculoskeletal: no edema  Neurological: Awake  Dermatological: no rash    Physical Examination:     Blood pressure 121/75, pulse 73, temperature 97.7 F (36.5 C), temperature source Axillary, resp. rate (!) 25, height 1.854 m (6' 0.99"), weight 103.9 kg (229 lb), SpO2 98 %.     General Appearance: Comfortable, and in no acute distress.   HEENT: Pupils are equal, round, and reactive to light.    Lungs: Clear to auscultation   Heart: Regular rate and rhythm   Chest: Symmetric chest wall expansion.    Abdomen: Soft ,non tender,no hepatosplenomegaly,good bowel sounds   Neurological: Paraplegia   Extremities: No edema    Laboratory And Diagnostic Studies:     Recent Labs      01/28/17   0424  01/27/17   0438   WBC  10.33  11.86*   Hgb  9.1*  9.3*   Hematocrit  30.1*  30.5*   Platelets  68*  69*     Recent Labs      01/28/17   0424  01/27/17   0438   Sodium  144  142   Potassium  3.3*  3.7   Chloride  112*  109   CO2  25  25   BUN  23.0  30.0*   Creatinine  0.8  1.0   Glucose  100  105*   Calcium  8.4  8.4     No results for input(s):  AST, ALT, ALKPHOS, PROT, ALB in the last 72 hours.    Current Meds:      Scheduled Meds: PRN Meds:        albuterol-ipratropium 3 mL Nebulization Q6H   amiodarone 200 mg Oral Daily   atorvastatin 20 mg per G tube QHS   balsam peru-castor oil (VENELEX)  Topical Q12H   famotidine 20 mg Intravenous Q12H SCH   glycopyrrolate 1 mg per G tube Daily   lactobacillus/streptococcus 1 capsule per G tube Daily   metoclopramide 5 mg per G tube Q8H   metoprolol tartrate 50 mg per G tube BID   QUEtiapine 25 mg Oral QHS   rivaroxaban 20 mg per G tube Daily with dinner   scopolamine 1 patch Transdermal Q72H       Continuous Infusions:  . dextrose 5 % and 0.9% NaCl 75 mL/hr at 01/28/17 0600   . dilTIAZem 5 mg/hr (01/28/17 0600)      acetaminophen 650 mg Q4H PRN   Or     acetaminophen 650 mg Q4H PRN   naloxone 0.2  mg PRN   ondansetron 4 mg Q6H PRN   Or     ondansetron 4 mg Q6H PRN   oxyCODONE-acetaminophen 1 tablet Q6H PRN   promethazine 6.25 mg Q6H PRN   senna 17.2 mg QD PRN   sodium chloride 200 mL Q12H PRN         [name] A. Janalyn Rouse, M.D.  01/28/2017  10:15 AM

## 2017-01-28 NOTE — Plan of Care (Signed)
Problem: Hemodynamic Status: Cardiac  Goal: Stable vital signs and fluid balance  Outcome: Progressing   01/28/17 1610   Goal/Interventions addressed this shift   Stable vital signs and fluid balance Monitor/assess vital signs and telemetry per unit protocol;Weigh on admission and record weight daily;Assess signs and symptoms associated with cardiac rhythm changes;Monitor intake/output per unit protocol and/or LIP order;Monitor lab values;Monitor for leg swelling/edema and report to LIP if abnormal       Pt alert to person, sating above 95% on Vent, lung sound Rhonchi, oral and inline suctioning provided, thick yellow tan secretion noted. Oral care provided. Heart monitor show afib, on cardizem drip. Pt denied SOB or chest pain. No nause or vomiting. Feeding tube running @ 75mL/hr, feeding well tolerated. Incontinent, no BM noted. Abdomen distended, hypoactive bowel sound, no guarding upon palpation. Pt reposition Q2hrs. Will continue to monitor Pt.

## 2017-01-29 LAB — CBC
Absolute NRBC: 0 10*3/uL
Hematocrit: 31.3 % — ABNORMAL LOW (ref 42.0–52.0)
Hgb: 9.3 g/dL — ABNORMAL LOW (ref 13.0–17.0)
MCH: 26.1 pg — ABNORMAL LOW (ref 28.0–32.0)
MCHC: 29.7 g/dL — ABNORMAL LOW (ref 32.0–36.0)
MCV: 87.7 fL (ref 80.0–100.0)
MPV: 13.6 fL — ABNORMAL HIGH (ref 9.4–12.3)
Nucleated RBC: 0 /100 WBC (ref 0.0–1.0)
Platelets: 104 10*3/uL — ABNORMAL LOW (ref 140–400)
RBC: 3.57 10*6/uL — ABNORMAL LOW (ref 4.70–6.00)
RDW: 17 % — ABNORMAL HIGH (ref 12–15)
WBC: 12.61 10*3/uL — ABNORMAL HIGH (ref 3.50–10.80)

## 2017-01-29 LAB — HEMOLYSIS INDEX: Hemolysis Index: 9 (ref 0–18)

## 2017-01-29 LAB — BASIC METABOLIC PANEL
Anion Gap: 7 (ref 5.0–15.0)
BUN: 17 mg/dL (ref 9.0–28.0)
CO2: 25 mEq/L (ref 22–29)
Calcium: 8.3 mg/dL (ref 7.9–10.2)
Chloride: 113 mEq/L — ABNORMAL HIGH (ref 100–111)
Creatinine: 0.8 mg/dL (ref 0.7–1.3)
Glucose: 101 mg/dL — ABNORMAL HIGH (ref 70–100)
Potassium: 3.9 mEq/L (ref 3.5–5.1)
Sodium: 145 mEq/L (ref 136–145)

## 2017-01-29 LAB — GLUCOSE WHOLE BLOOD - POCT
Whole Blood Glucose POCT: 102 mg/dL — ABNORMAL HIGH (ref 70–100)
Whole Blood Glucose POCT: 103 mg/dL — ABNORMAL HIGH (ref 70–100)
Whole Blood Glucose POCT: 129 mg/dL — ABNORMAL HIGH (ref 70–100)
Whole Blood Glucose POCT: 98 mg/dL (ref 70–100)

## 2017-01-29 LAB — MAGNESIUM: Magnesium: 1.6 mg/dL (ref 1.6–2.6)

## 2017-01-29 LAB — GFR: EGFR: >60

## 2017-01-29 MED ORDER — SIMETHICONE 80 MG PO CHEW
80.0000 mg | CHEWABLE_TABLET | Freq: Three times a day (TID) | ORAL | Status: DC
Start: 2017-01-29 — End: 2017-01-30
  Administered 2017-01-29 – 2017-01-30 (×3): 80 mg via GASTROSTOMY
  Filled 2017-01-29 (×4): qty 1

## 2017-01-29 MED ORDER — AMOXICILLIN-POT CLAVULANATE 400-57 MG/5ML PO SUSR
500.0000 mg | Freq: Three times a day (TID) | ORAL | Status: DC
Start: 2017-01-29 — End: 2017-01-30
  Administered 2017-01-29 – 2017-01-30 (×3): 500 mg via ORAL
  Filled 2017-01-29 (×7): qty 6.25

## 2017-01-29 NOTE — Progress Notes (Signed)
Pt screened Positive for SIRS with score 2; paged and notified Dr. Legrand Como; no order given for now. MD stated that he will reavaluate Pt in AM. Will continue to monitor Pt.

## 2017-01-29 NOTE — Plan of Care (Signed)
Problem: Artificial Airway  Goal: Tracheostomy will be maintained  Outcome: Progressing   01/29/17 1909   Goal/Interventions addressed this shift   Tracheostomy will be maintained Suction secretions as needed;Keep head of bed at 30 degrees, unless contraindicated;Encourage/perform oral hygiene as appropriate;Apply water-based moisturizer to lips;Utilize tracheostomy securing device;Support ventilator tubing to avoid pressure from drag of tubing;Tracheostomy care every shift and as needed;Keep additional tracheostomy tube of the same size and one size smaller at bedside   Patient has done well throughout the majority of the shift--progressing from CPAP to trach collar up until about 1800. At this time the patient began coughing and despite multiple suction attempts which drew up a decent amount of sputum was still having difficulty. The patient was placed back on the vent and returned to a state of calm and has been this way since that time. Will continue to monitor.

## 2017-01-29 NOTE — Progress Notes (Signed)
Patient starting having respiratory distress due to increased secretion despite of suctioning. Pt started to desat. Pt was suctioned more and put back on vent support. He is resting and sats are 100.

## 2017-01-29 NOTE — Plan of Care (Signed)
Problem: Inadequate Gas Exchange  Goal: Adequate oxygenation and improved ventilation  Outcome: Progressing   01/29/17 0611   Goal/Interventions addressed this shift   Adequate oxygenation and improved ventilation Assess lung sounds;Monitor SpO2 and treat as needed;Provide mechanical and oxygen support to facilitate gas exchange;Position for maximum ventilatory efficiency;Increase activity as tolerated/progressive mobility;Teach/reinforce use of incentive spirometer 10 times per hour while awake, cough and deep breath as needed;Plan activities to conserve energy: plan rest periods;Consult/collaborate with Respiratory Therapy       Problem: Hemodynamic Status: Cardiac  Goal: Stable vital signs and fluid balance  Outcome: Progressing   01/29/17 0611   Goal/Interventions addressed this shift   Stable vital signs and fluid balance Monitor/assess vital signs and telemetry per unit protocol;Weigh on admission and record weight daily;Assess signs and symptoms associated with cardiac rhythm changes;Monitor intake/output per unit protocol and/or LIP order;Monitor lab values;Monitor for leg swelling/edema and report to LIP if abnormal       Comments: Pt A&Ox1, VSS, sating above 95% on vent setting, off cardizem drip, A-fib on heart monitor. Pt denied chest pain. Pt's abdomen distended, passing gas, had 2 liquid BM during shift. On continuous feeding promote running @ 40,  no nausea or vomiting during. No residual, tube feeding well tolerated. CHG and oral care given, Pt repositioned Q2hrs. Will continue to monitor Pt as appropriate.

## 2017-01-29 NOTE — Progress Notes (Signed)
[facility]  INTERNAL MEDICINE PROGRESS NOTE    Date Time: 01/29/17 6:15 AM  Patient Name: [name],[name]      Problem List:      Thrombocytopenia - improving off Zosyn   Bowel Obstruction vs. Ileus- resolved   Nausea and Vomiting - resolved   Urine culture-MDR Proteus mirabilis-Status post 5 days course of treatment (gentamicin and Zosyn/ Urine culture/Repeat nonsignificant bacteria   Pneumoniawith Bibasilar consolidation( sputum culture with Serrata &Pseudomonas)   Leukocytosis   Malfunction G-tube/Replaced   Metabolic Encephalopathy   Acute on Chronic Respiratory Failure    Cardiomyopathy with last EF @40  percent   Hypertension   Chronic Atrial Fibrillation on AC     Plan:      Platelate count has been trending up   Zosyn stopped, start Augmentin per ID recommendations   Tolerating  tube Feeding rate and has been advance    Stop  IVF fluids   Stop Cardizem  and continue amiodarone   DVT prophylaxis, on xareo   Discussed plan of care with patient   Discussed plan of care with nurses..    Disposition planning for tomorrow.     Subjective:     [name] had been doing better. He has no vomiting and his obstruction has improve.   He has been toelrating tube feedings.     Medications:   Medications:   Scheduled Meds: PRN Meds:        albuterol-ipratropium 3 mL Nebulization Q6H   amiodarone 200 mg Oral Daily   atorvastatin 20 mg per G tube QHS   balsam peru-castor oil (VENELEX)  Topical Q12H   famotidine 20 mg Intravenous Q12H SCH   glycopyrrolate 1 mg per G tube Daily   lactobacillus/streptococcus 1 capsule per G tube Daily   metoclopramide 5 mg per G tube Q8H   metoprolol tartrate 50 mg per G tube BID   QUEtiapine 25 mg Oral QHS   rivaroxaban 20 mg per G tube Daily with dinner   scopolamine 1 patch Transdermal Q72H          acetaminophen 650 mg Q4H PRN   Or     acetaminophen 650 mg Q4H PRN   naloxone 0.2 mg PRN   ondansetron 4 mg Q6H PRN   Or     ondansetron 4 mg Q6H PRN    oxyCODONE-acetaminophen 1 tablet Q6H PRN   promethazine 6.25 mg Q6H PRN   senna 17.2 mg QD PRN   sodium chloride 200 mL Q12H PRN           Physical Exam:     VITAL SIGNS   Temp:  [97.6 F (36.4 C)-99 F (37.2 C)] 97.6 F (36.4 C)  Heart Rate:  [63-82] 82  Resp Rate:  [19-25] 19  BP: (116-138)/(62-80) 138/75  FiO2:  [30 %] 30 %  POCT Glucose Result (Read Only)  Avg: 118  Min: 82  Max: 154  SpO2: 99 %  No intake or output data in the 24 hours ending 01/29/17 0615      General: Awake, alert, in no acute distress.   Heent: pinkish conjunctiva, anicteric sclera, moist mucus membrane   Neck : tracheostomy in situ and on vent    Cvs: S1 & S 2 well heard, regular rate and rhythm   Chest: Clear to auscultation   Abdomen: mildly distended, non-tender, active bowel sounds.   Ext : no cyanosis, trace edema   CNS: Alert, follows command,    Laboratory Results:  CHEMISTRY:     Recent Labs  Lab 01/29/17  0528 01/28/17  0424 01/27/17  0438 01/26/17  0436 01/25/17  0331   Glucose 101* 100 105* 110* 142*   BUN 17.0 23.0 30.0* 38.0* 37.0*   Creatinine 0.8 0.8 1.0 1.0 1.3   Calcium 8.3 8.4 8.4 8.4 9.4   Sodium 145 144 142 140 139   Potassium 3.9 3.3* 3.7 3.4* 4.3   Chloride 113* 112* 109 105 101   CO2 25 25 25 26 25    Magnesium 1.6  --   --   --   --        HEMATOLOGY:    Recent Labs  Lab 01/29/17  0528 01/28/17  0424 01/27/17  0438 01/26/17  0436 01/25/17  0331   WBC 12.61* 10.33 11.86* 13.81* 19.54*   Hgb 9.3* 9.1* 9.3* 9.6* 11.0*   Hematocrit 31.3* 30.1* 30.5* 31.0* 35.1*   MCV 87.7 86.0 85.9 84.0 83.0   MCH 26.1* 26.0* 26.2* 26.0* 26.0*   MCHC 29.7* 30.2* 30.5* 31.0* 31.3*   Platelets 104* 68* 69* 97* 207       MICROBIOLOGY:  Microbiology Results     Procedure Component Value Units Date/Time    Bacterial ID & Sens Non-Urine Isolate [[id number]] Collected:  01/08/17 1029     Updated:  01/12/17 1334    Narrative:       [id number] called Micro Results of_pos bld. Results read back [id number], by [id number]  on 01/10/2017 at  00:36  Source: Blood  Test code: 95621H  Submitted: BAP  Send isolate slant    Blood Culture Aerobic/Anaerobic #1 [[id number]] Collected:  01/08/17 1029    Specimen:  Arm from Blood Updated:  01/13/17 1338    Narrative:       [id number] called Micro Results of_pos bld. Results read back [id number], by [id number]  on 01/10/2017 at 00:36  Source: Blood  Test code: 01027O  Submitted: BAP  ORDER#: [id number]                                    ORDERED BY: Lehman Prom  SOURCE: Blood arm                                    COLLECTED:  01/08/17 10:29  ANTIBIOTICS AT COLL.:                                RECEIVED :  01/08/17 15:23  [id number] called Micro Results of_pos bld. Results read back [id number], by [phone] on 01/10/2017 at 00:36  Culture Blood Aerobic and Anaerobic        PRELIM      01/13/17 13:38   +  01/09/17   No Growth after 1 day/s of incubation.  01/10/17   Anaerobic Blood Culture Positive in 24 to 48 hours             Gram Stain Shows: Gram positive rods             Identification to follow  01/12/17   Aerobic Blood Culture Positive after 4 days             Gram Stain Shows: Gram positive rods             Identification to follow  01/11/17   Growth of Gram-positive rod               Identification to follow             Isolate sent to reference lab        Blood Culture Aerobic/Anaerobic #2 [[id number]] Collected:  01/08/17 1029    Specimen:  Arm from Blood Updated:  01/13/17 1552    Narrative:       [id number] called Micro Results of_pos bld. Results read back [id number], by [id number]  on 01/09/2017 at 21:45  ORDER#: [id number]                                    ORDERED BY: Lehman Prom  SOURCE: Blood arm                                    COLLECTED:  01/08/17 10:29  ANTIBIOTICS AT COLL.:                                RECEIVED :  01/08/17 15:23  [id number]_ called Micro Results of_pos bld. Results read back [id number], by [id number] on 01/09/2017 at 21:45  Culture Blood Aerobic and Anaerobic        PRELIM      01/13/17 15:52   +  01/09/17    Anaerobic Blood Culture Positive in 24 to 48 hours             Gram Stain Shows: Gram positive rods  01/11/17   Aerobic culture no growth to date, final report to follow  01/13/17   Aerobic Blood Culture No Growth  01/11/17   Growth of Gram-positive rod               Identification to follow        CULTURE BLOOD AEROBIC AND ANAEROBIC [[id number]] Collected:  01/20/17 2115    Specimen:  Blood, Venipuncture Updated:  01/24/17 0421    Narrative:       ORDER#: [id number]                                    ORDERED BY: Gearldine Shown  SOURCE: Blood, Venipuncture arm                      COLLECTED:  01/20/17 21:15  ANTIBIOTICS AT COLL.:                                RECEIVED :  01/21/17 03:50  Culture Blood Aerobic and Anaerobic        PRELIM      01/24/17 04:21  01/22/17   No Growth after 1 day/s of incubation.  01/23/17   No Growth after 2 day/s of incubation.  01/24/17   No Growth after 3 day/s of incubation.      CULTURE BLOOD AEROBIC AND ANAEROBIC [[id number]] Collected:  01/13/17 1646    Specimen:  Blood, Venipuncture Updated:  01/18/17 2221    Narrative:       ORDER#: [id number]  ORDERED BY: CHUTUAPE, ARTHU  SOURCE: Blood, Venipuncture peripheral               COLLECTED:  01/13/17 16:46  ANTIBIOTICS AT COLL.:                                RECEIVED :  01/13/17 20:15  Culture Blood Aerobic and Anaerobic        FINAL       01/18/17 22:21  01/18/17   No growth after 5 days of incubation.      MRSA Culture [[id number]] Collected:  01/08/17 1029    Specimen:  Body Fluid from Nasal/Throat ASC Admission Updated:  01/09/17 1653    Narrative:       ORDER#: [id number]                                    ORDERED BY: Lehman Prom  SOURCE: Nares and Throat                             COLLECTED:  01/08/17 10:29  ANTIBIOTICS AT COLL.:                                RECEIVED :  01/08/17 15:48  Culture MRSA Surveillance                  FINAL       01/09/17 16:53  01/09/17   Negative for Methicillin  Resistant Staph aureus from Nares and             Negative for Methicillin Resistant Staph aureus from Throat      MRSA culture [[id number]] Collected:  01/09/17 0423    Specimen:  Body Fluid from Nasal/Throat ASC Admission Updated:  01/11/17 0950    Narrative:       ORDER#: [id number]                                    ORDERED BY: AL KHOURI, SAMI  SOURCE: Nares and Throat                             COLLECTED:  01/09/17 04:23  ANTIBIOTICS AT COLL.:                                RECEIVED :  01/09/17 10:27  Culture MRSA Surveillance                  FINAL       01/11/17 09:50   +  01/11/17   Positive for Methicillin Resistant Staph aureus from Nares             Negative for Methicillin Resistant Staph aureus from Throat      Sputum Culture [[id number]] Collected:  01/08/17 1129    Specimen:  Sputum from Tracheal Aspirate Updated:  01/11/17 1448    Narrative:       ORDER#: [id number]  ORDERED BY: LEWIS, KATHERIN  SOURCE: Tracheal Aspirate trach                      COLLECTED:  01/08/17 11:29  ANTIBIOTICS AT COLL.:                                RECEIVED :  01/08/17 16:01  Stain, Gram (Respiratory)                  FINAL       01/08/17 17:23  01/08/17   Few WBC's             Rare WBC's             Moderate Mixed Respiratory Flora  Culture and Gram Stain, Aerobic, RespiratorFINAL       01/11/17 14:48   +  01/09/17   Heavy growth of mixed upper respiratory flora  01/11/17   Heavy growth of Serratia marcescens               Morganella, Serratia, Providencia, Hafnia, Citrobacter, and             Enterobacter species may develop resistance while on therapy             with B-lactam antibiotics, even if initially susceptible, due             to derepression of AmpC B-lactamase. CLSI M100-S24,             [facility] Antimicrobial Subcommittee June 2015    01/11/17   Heavy growth of Pseudomonas aeruginosa      _____________________________________________________________________________                                   S.marcescens    P.aeruginosa    ANTIBIOTICS                     MIC  INTRP      MIC  INTRP      _____________________________________________________________________________  Amikacin                                        <=8    S        Amoxicillin/CA                 >16/8   R                        Ampicillin                      >16    R                        Aztreonam                       <=2    S         4     S        Cefazolin                       >16    R  Cefepime                        <=1    S         2     S        Ceftazidime                     <=2    S        <=2    S        Ceftriaxone                     <=1    S                        Ciprofloxacin                    2     I         2     I        Ertapenem                     <=0.25   S                        Gentamicin                      <=2    S        <=2    S        Levofloxacin                    <=1    S         4     I        Meropenem                     <=0.25   S         8     R        Piperacillin/Tazobactam        <=4/4   S        4/4    S        Tetracycline                    >8     R                        Tobramycin                                      <=2    S        _____________________________________________________________________________            S=SUSCEPTIBLE     I=INTERMEDIATE     R=RESISTANT                            N/S=NON-SUSCEPTIBLE  _____________________________________________________________________________      Urine culture [[id number]] Collected:  01/19/17 1748    Specimen:  Urine from Urine, Catheterized, In & Out Updated:  01/20/17 1946    Narrative:       ORDER#: [id number]  ORDERED BY: CHOUDHARY, IMTI  SOURCE: Urine, Catheterized, In & Out                COLLECTED:  01/19/17 17:48  ANTIBIOTICS AT COLL.:                                RECEIVED :  01/19/17 21:45  Culture Urine                              FINAL        01/20/17 19:46  01/20/17   No growth of >1,000 CFU/ML, No further work      Urine culture [[id number]] Collected:  01/08/17 1129    Specimen:  Urine from Urine, Catheterized, In & Out Updated:  01/10/17 1517    Narrative:       ORDER#: [id number]                                    ORDERED BY: Lehman Prom  SOURCE: Urine, Catheterized, In & Out                COLLECTED:  01/08/17 11:29  ANTIBIOTICS AT COLL.:                                RECEIVED :  01/08/17 15:48  Culture Urine                              FINAL       01/10/17 15:17   +  01/10/17   >100,000 CFU/ML MDR Proteus mirabilis               Cefazolin results predict results for the oral agents             cefuroxime axetil, cephalexin, cefdinir, cefpodoxime, and             cefprozil when used for therapy of uncomplicated UTIs due to             E.coli, K. pneumonia, and P. mirabilis. CLSI M100 S25             This multidrug resistant (MDR) Enterobacteriaceae is resistant             to ceftriaxone and may not respond optimally to B-lactam             antibiotics (excluding carbapenems).             [facility] Antimicrobial Subcommittee June 2015    _____________________________________________________________________________                                MDR P.mirabilis   ANTIBIOTICS                     MIC  INTRP      _____________________________________________________________________________  Amikacin                        >32    R        Amoxicillin/CA                 <=  4/2   S        Ampicillin                      >16    R        Aztreonam                       >16    R        Cefazolin                       >16    R        Cefepime                        >16    R        Ceftazidime                     >16    R        Ceftriaxone                     >32    R        Ciprofloxacin                   >2     R        Ertapenem                     <=0.25   S        Gentamicin                      >8     R        Levofloxacin                    >4      R        Nitrofurantoin                  >64    R  D1    Piperacillin/Tazobactam        <=2/4   S        Tetracycline                    >8     R        Tobramycin                      >8     R        Trimethoprim/Sulfamethoxazole  >2/38   R          -----DRUG COMMENTS----------    D1:  Nitrofurantoin should only be used for the treatment of         uncomplicated cystitis.         [facility] Antimicrobial Subcommittee June 2015  _____________________________________________________________________________            S=SUSCEPTIBLE     I=INTERMEDIATE     R=RESISTANT                            N/S=NON-SUSCEPTIBLE  _____________________________________________________________________________              Radiology Results:   Radiological Procedure reviewed.  Ct Head Wo Contrast  Result Date: 01/08/2017    No acute intracranial abnormality. Wyatt Portela, MD 01/08/2017 11:16 AM    Ct Abdomen Pelvis W Iv Contrast Only    Result Date: 01/26/2017  1. Improving small bowel obstruction. 2. Inflammatory changes adjacent to the second portion of the duodenum favoring duodenitis. 3. Bilateral pleural effusions and bibasilar atelectasis. Right middle lobe infiltrate is not excluded. 4. Cholelithiasis. 5. Right nephrolithiasis. 6. Additional chronic findings as above. Jorene Guest, MD 01/26/2017 12:16 AM    Xr Chest Ap Portable    Result Date: 01/24/2017   Stable chest Gaynelle Arabian, MD 01/24/2017 11:07 AM    Xr Chest Ap Portable    Result Date: 01/21/2017   Worsening pulmonary edema pattern with underlying worsening small to moderate bilateral pleural effusions Laurena Slimmer, MD 01/21/2017 8:00 AM    Xr Chest Ap Portable    Result Date: 01/19/2017  1. Stable small layering bilateral pleural effusions. 2. Consolidation throughout the retrocardiac region which may be secondary to layering pleural fluid, subsegmental atelectatic changes and/or infiltrates.  Fonnie Mu, MD 01/19/2017 12:57 PM    Xr Chest Ap Portable    Result Date:  01/10/2017   Technically limited portable study. Increased bilateral pleural effusions and bibasilar opacification. Heron Nay, MD 01/10/2017 8:01 AM    Xr Chest  Ap Portable    Result Date: 01/08/2017    Interval improvement of the vascular congestion and pulmonary edema and decrease in size of the pleural effusions. Heron Nay, MD 01/08/2017 11:29 AM    Xr Abdomen Portable    Result Date: 01/24/2017   Multiple dilated small bowel loops consistent with mechanical obstruction or ileus. Gustavus Messing, MD 01/24/2017 5:10 PM    G,j,g/j Tube Replacement    Result Date: 01/10/2017    Sinogram of the old gastrostomy tube tract allowing recanalization and redilation with eventual replacement of 20 French MIC gastrostomy catheter in good position the stomach. The new catheter may be used immediately. Dara Lords, MD 01/10/2017 11:05 AM      Signed by: Ledora Bottcher M.D.  Spectra Link: [id number]  Office Phone Number : [phone]

## 2017-01-29 NOTE — Progress Notes (Signed)
Infectious Diseases & Tropical Medicine  Progress Note    01/29/2017   Ricahrd Schwager ZOX:[id number]:[id number] is a 83 y.o. male,       Assessment:      Encephalopathy.   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   Status post tracheostomy and G-tube placement.   Resident of [facility] nursing home    Plan:      Start  Augmentin 14 days   Continue probiotics.   Monitor electrolytes and renal functions closely.   Monitor clinically .   Discussed with pharmacy    ROS:     General: No fever, no chills, no rigor,sleepy but arousable  Respiratory: no cough, shortness of breath, or wheezing, Tracheostomy in place   Gastrointestinal: no diarrhea  Genito-Urinary: no hematuria  Musculoskeletal: no edema  Neurological: Sleepy  Dermatological: no rash    Physical Examination:     Blood pressure 138/75, pulse 82, temperature 97.6 F (36.4 C), temperature source Oral, resp. rate 19, height 1.854 m (6' 0.99"), weight 103.9 kg (229 lb), SpO2 100 %.     General Appearance: Comfortable, and in no acute distress.   HEENT: Pupils are equal, round, and reactive to light.    Lungs: Clear to auscultation   Heart: Regular rate and rhythm   Chest: Symmetric chest wall expansion.    Abdomen: Soft ,non tender,no hepatosplenomegaly,good bowel sounds   Neurological: Paraplegia   Extremities: No edema    Laboratory And Diagnostic Studies:     Recent Labs      01/29/17   0528  01/28/17   0424   WBC  12.61*  10.33   Hgb  9.3*  9.1*   Hematocrit  31.3*  30.1*   Platelets  104*  68*     Recent Labs      01/29/17   0528  01/28/17   0424   Sodium  145  144   Potassium  3.9  3.3*   Chloride  113*  112*   CO2  25  25   BUN  17.0  23.0   Creatinine  0.8  0.8   Glucose  101*  100   Calcium  8.3  8.4     No results for input(s): AST, ALT,  ALKPHOS, PROT, ALB in the last 72 hours.    Current Meds:      Scheduled Meds: PRN Meds:        albuterol-ipratropium 3 mL Nebulization Q6H   amiodarone 200 mg Oral Daily   atorvastatin 20 mg per G tube QHS   balsam peru-castor oil (VENELEX)  Topical Q12H   famotidine 20 mg Intravenous Q12H SCH   glycopyrrolate 1 mg per G tube Daily   lactobacillus/streptococcus 1 capsule per G tube Daily   metoclopramide 5 mg per G tube Q8H   metoprolol tartrate 50 mg per G tube BID   QUEtiapine 25 mg Oral QHS   rivaroxaban 20 mg per G tube Daily with dinner   scopolamine 1 patch Transdermal Q72H       Continuous Infusions:     acetaminophen 650 mg Q4H PRN   Or     acetaminophen 650 mg Q4H PRN   naloxone 0.2 mg PRN   ondansetron 4 mg Q6H PRN   Or     ondansetron 4 mg Q6H PRN  oxyCODONE-acetaminophen 1 tablet Q6H PRN   promethazine 6.25 mg Q6H PRN   senna 17.2 mg QD PRN   sodium chloride 200 mL Q12H PRN         Ashyia Schraeder A. Janalyn Rouse, M.D.  01/29/2017  9:19 AM

## 2017-01-30 LAB — GLUCOSE WHOLE BLOOD - POCT
Whole Blood Glucose POCT: 92 mg/dL (ref 70–100)
Whole Blood Glucose POCT: 100 mg/dL (ref 70–100)
Whole Blood Glucose POCT: 111 mg/dL — ABNORMAL HIGH (ref 70–100)

## 2017-01-30 MED ORDER — OXYCODONE-ACETAMINOPHEN 5-325 MG PO TABS
1.0000 | ORAL_TABLET | Freq: Four times a day (QID) | ORAL | 0 refills | Status: DC | PRN
Start: 2017-01-30 — End: 2017-01-30

## 2017-01-30 MED ORDER — SIMETHICONE 80 MG PO CHEW
80.0000 mg | CHEWABLE_TABLET | Freq: Three times a day (TID) | ORAL | Status: AC
Start: 2017-01-30 — End: ?

## 2017-01-30 MED ORDER — SODIUM CHLORIDE 0.9 % IV MBP
3.0000 g | Freq: Four times a day (QID) | INTRAVENOUS | Status: DC
Start: 2017-01-30 — End: 2017-01-30
  Filled 2017-01-30 (×2): qty 20

## 2017-01-30 MED ORDER — AMOXICILLIN-POT CLAVULANATE 400-57 MG/5ML PO SUSR
500.0000 mg | Freq: Three times a day (TID) | ORAL | Status: AC
Start: 2017-01-30 — End: 2017-02-13

## 2017-01-30 MED ORDER — AMOXICILLIN-POT CLAVULANATE 400-57 MG/5ML PO SUSR
500.0000 mg | Freq: Three times a day (TID) | ORAL | Status: DC
Start: 2017-01-30 — End: 2017-01-30
  Administered 2017-01-30: 16:00:00 500 mg via ORAL
  Filled 2017-01-30 (×2): qty 6.25

## 2017-01-30 NOTE — Progress Notes (Signed)
[facility] Fx PNA: from [facility] vent unit.    CPAP trial 01/29/17 w/ coughing and desat noted, placed back to vent. Lactobacillus bacteremia per ID, on Augmentin via feeding tube.    DCP to [facility], possible [place] [facility] per notes. Pt no longer being treated for MDR bacteria, will not require isolation at [facility] at this time. Referral updated.    Transport via ambulance    Madie Reno, RN  Clinical Case Manager I  [phone]

## 2017-01-30 NOTE — Discharge Summary (Signed)
[facility]  DISCHARGE NOTE    Date Time: 01/30/17 10:04 AM  Patient Name: [name],[name]  Attending Physician: Marlene Bast*    Date of Admissions :      01/08/2017    Date of Discharge:     01/30/2017    Discharge Diagnosis:      Metabolic Encephalopathy   Pneumoniawith Bibasilar consolidation( sputum culture with Delma Officer &Pseudomonas)   Urine culture-MDR Proteus mirabili   Thrombocytopenia   Bowel Obstruction vs. Ileus- resolved   Nausea and Vomiting - resolved   Leukocytosis   Malfunction G-tube/Replaced   Acute on Chronic Respiratory Failure    Cardiomyopathy with last EF @40  percent   Hypertension   Chronic Atrial Fibrillation on [facility]    Consultations:     Treatment Team:   Attending Provider: Ledora Bottcher, MD  Consulting Physician: Hershal Coria, MD  Consulting Physician: [name], MD  Consulting Physician: Tenna Child, MD    Procedures Performed:     Ct Head Wo Contrast    Result Date: 01/08/2017    No acute intracranial abnormality. Wyatt Portela, MD 01/08/2017 11:16 AM    Ct Abdomen Pelvis W Iv Contrast Only    Result Date: 01/26/2017  1. Improving small bowel obstruction. 2. Inflammatory changes adjacent to the second portion of the duodenum favoring duodenitis. 3. Bilateral pleural effusions and bibasilar atelectasis. Right middle lobe infiltrate is not excluded. 4. Cholelithiasis. 5. Right nephrolithiasis. 6. Additional chronic findings as above. Jorene Guest, MD 01/26/2017 12:16 AM    Xr Chest Ap Portable    Result Date: 01/24/2017   Stable chest Gaynelle Arabian, MD 01/24/2017 11:07 AM    Xr Chest Ap Portable    Result Date: 01/21/2017   Worsening pulmonary edema pattern with underlying worsening small to moderate bilateral pleural effusions Laurena Slimmer, MD 01/21/2017 8:00 AM    Xr Chest Ap Portable    Result Date: 01/19/2017  1. Stable small layering bilateral pleural effusions. 2. Consolidation throughout the retrocardiac region which may  be secondary to layering pleural fluid, subsegmental atelectatic changes and/or infiltrates.  Fonnie Mu, MD 01/19/2017 12:57 PM    Xr Chest Ap Portable    Result Date: 01/10/2017   Technically limited portable study. Increased bilateral pleural effusions and bibasilar opacification. Heron Nay, MD 01/10/2017 8:01 AM    Xr Chest  Ap Portable    Result Date: 01/08/2017    Interval improvement of the vascular congestion and pulmonary edema and decrease in size of the pleural effusions. Heron Nay, MD 01/08/2017 11:29 AM    Xr Abdomen Portable    Result Date: 01/24/2017   Multiple dilated small bowel loops consistent with mechanical obstruction or ileus. Gustavus Messing, MD 01/24/2017 5:10 PM    G,j,g/j Tube Replacement    Result Date: 01/10/2017    Sinogram of the old gastrostomy tube tract allowing recanalization and redilation with eventual replacement of 20 French MIC gastrostomy catheter in good position the stomach. The new catheter may be used immediately. Dara Lords, MD 01/10/2017 11:05 AM      Hospital Course:        Donelle Baba is a 83 y.o. male Resident of  [name] had remote cervical fracture   with Quadriparesis , trach and vented and G tube  Feeding was recently discharged from [facility] after being treated for multiple MDR organism from sputum and Pneumonia  brought  from   [place] for altered mental status on the day of admission. On arrival to emergency room, since is   awake, hemodynamically stable, temperature of 97 saturating 100 percent.  Initial workup was with   chest x-ray and was read as interval improvement of vascular congestion and pulmonary edema and   decreased in size of pleural effusion.CTH read as no acute intracranial abnormality. Blood work up   was significant for Leucocytosis at 26,000 with shift to the left.  Anemia with hematocrit of 34 percent,   elevated lactic acid at 2.2 with negative troponin.  Urine is dirty with too numerous white blood cells    and positive leukocyte esterase. He was admitted with sepsis.  Please refer to the H & P for details   of his presentation. He was admitted and was started on IV antibiotics ( vancomycin & meropenem )  and IVF hydration. He was  Seen by ID and cultures were followed. Urine culture grew proteus and   Sputum culture grew serratia and pseudomonas. He was continued with IV antibiotics and improved   Slowly. His G tube was changed by CVIR as it was malfunctioning. He developed small bowel obstruction   And his tube feedings were on hold. His oral medications were on hold and has to be given IV. He was  Started on IV Cardizem for rate control of his atrial fibrillation with RVR. He was seen by Surgery  and did not need any intervention. His SBO resolved and was started back on tube feeding.He was able  to tolerate his tube feedings. He was back on amiodarone and his heart rate was controlled well. He   developed thrombocytopenia and was believed was caused by Zosyn which was stopped and platelets   Recovered. He  was maintained on vent support here and was able to be on CPAP and also trach collar.   Overall he has improved well and will be discharged to [facility]     Laboratory Results:     Recent Labs  Lab 01/29/17  0528 01/28/17  0424 01/27/17  0438 01/26/17  0436 01/25/17  0331   Glucose 101* 100 105* 110* 142*   BUN 17.0 23.0 30.0* 38.0* 37.0*   Creatinine 0.8 0.8 1.0 1.0 1.3   Calcium 8.3 8.4 8.4 8.4 9.4   Sodium 145 144 142 140 139   Potassium 3.9 3.3* 3.7 3.4* 4.3   Chloride 113* 112* 109 105 101   CO2 25 25 25 26 25    Magnesium 1.6  --   --   --   --            Recent Labs  Lab 01/29/17  0528 01/28/17  0424 01/27/17  0438 01/26/17  0436 01/25/17  0331   WBC 12.61* 10.33 11.86* 13.81* 19.54*   Hgb 9.3* 9.1* 9.3* 9.6* 11.0*   Hematocrit 31.3* 30.1* 30.5* 31.0* 35.1*   MCV 87.7 86.0 85.9 84.0 83.0   MCH 26.1* 26.0* 26.2* 26.0* 26.0*   MCHC 29.7* 30.2* 30.5* 31.0* 31.3*   Platelets 104* 68* 69* 97* 207        Microbiology Results     Procedure Component Value Units Date/Time    Bacterial ID & Sens Non-Urine Isolate [[id number]] Collected:  01/08/17 1029     Updated:  01/28/17 0955     Result/Comment: SEE BELOW     Comment: Bacterial Identification # Susceptibility, Aerobic  SOURCE : BLOOD   Result/Comment:  The following  applies to testing performed for  Ampicillin-sulbactam:  This test was performed using a kit that has not  been cleared or approved by the FDA.  The  analytical performance characteristics of this  test have been determined by [facility], [place], [place].  This test should  not be used for diagnosis without confirmation by  other medically established means.  Testing not available for Piperacillin/Tazobactam due  to manufacturer backorder.  Beta-Lactamase negative      ORGANISM(S) ISOLATED  ------------------------------------------------------------  1.  Lactobacillus paracasei     MIC REPORT:                           MIC      ANTIBIOTIC                       (mcg/mL) INTERPRETATION       Penicillin. . . . . . . . . .       0.5   SUSCEPTIBLE       Clindamycin . . . . . . . . .     0.125   SUSCEPTIBLE  ------------------------------------------------------------  2. Lactobacillus fermentum (sequence ID)  This test was developed and its analytical performance  characteristics have been determined by [facility] [place], [place]. It has  not been cleared or approved by the U.S. Food and Drug  Administration. This assay has been validated pursuant  to the CLIA regulations and is used for clinical  purposes.     MIC REPORT:                           MIC      ANTIBIOTIC                       (mcg/mL) INTERPRETATION       Penicillin. . . . . . . . . .      0.75   SUSCEPTIBLE       Clindamycin . Lonna Duval  ------------------------------------------------------------  Test Performed by Clerance Lav,  [facility],  [address], [place], [place] [address]  Si Raider, M.D., Ph.D., Director of Laboratories  [phone], CLIA [id number]         Narrative:       [phone] called Micro Results of_pos bld. Results read back [id number], by [phone]  on 01/10/2017 at 00:36  Source: Blood  Test code: 88416S  Submitted: BAP    Blood Culture Aerobic/Anaerobic #1 [[id number]] Collected:  01/08/17 1029    Specimen:  Arm from Blood Updated:  01/29/17 1405    Narrative:       Results Faxed to:  Dr [place] Spl[place] [id number] on 01/29/2017 at 14:05  [id number] called Micro Results of_pos bld. Results read back [id number], by [id number]  on 01/10/2017 at 00:36  Source: Blood  Test code: 62376E  Submitted: BAP  ORDER#: [id number]                                    ORDERED BY: Lehman Prom  SOURCE: Blood arm  COLLECTED:  01/08/17 10:29  ANTIBIOTICS AT COLL.:                                RECEIVED :  01/08/17 15:23  Results Faxed to:  [place] [id number] on 01/29/2017 at 14:05  [id number] called Micro Results of_pos bld. Results read back [id number], by [phone] on 01/10/2017 at 00:36  Culture Blood Aerobic and Anaerobic        FINAL       01/29/17 14:05   +  01/10/17   Anaerobic Blood Culture Positive in 24 to 48 hours             Gram Stain Shows: Gram positive rods  01/12/17   Aerobic Blood Culture Positive after 4 days             Gram Stain Shows: Gram positive rods  01/11/17   Growth of Gram-positive rod               Results from reference laboratory sendouts will be reported             separately. See report from [facility].        Blood Culture Aerobic/Anaerobic #2 [[id number]] Collected:  01/08/17 1029    Specimen:  Arm from Blood Updated:  01/29/17 1406    Narrative:       [id number]_ called Micro Results of_pos bld. Results read back [id number], by [id number]  on 01/09/2017 at 21:45  ORDER#: [id number]                                    ORDERED BY: Lehman Prom  SOURCE: Blood arm                                     COLLECTED:  01/08/17 10:29  ANTIBIOTICS AT COLL.:                                RECEIVED :  01/08/17 15:23  61101_ called Micro Results of_pos bld. Results read back [id number], by [id number] on 01/09/2017 at 21:45  Culture Blood Aerobic and Anaerobic        FINAL       01/29/17 14:06   +  01/09/17   Anaerobic Blood Culture Positive in 24 to 48 hours             Gram Stain Shows: Gram positive rods  01/13/17   Aerobic Blood Culture No Growth  01/11/17   Growth of Gram-positive rod               Refer to Identification on culture #[id number]        CULTURE BLOOD AEROBIC AND ANAEROBIC [[id number]] Collected:  01/20/17 2115    Specimen:  Blood, Venipuncture Updated:  01/26/17 0621    Narrative:       ORDER#: [id number]                                    ORDERED BY: Gearldine Shown  SOURCE: Blood, Venipuncture arm  COLLECTED:  01/20/17 21:15  ANTIBIOTICS AT COLL.:                                RECEIVED :  01/21/17 03:50  Culture Blood Aerobic and Anaerobic        FINAL       01/26/17 06:21  01/26/17   No growth after 5 days of incubation.      CULTURE BLOOD AEROBIC AND ANAEROBIC [[id number]] Collected:  01/13/17 1646    Specimen:  Blood, Venipuncture Updated:  01/18/17 2221    Narrative:       ORDER#: [id number]                                    ORDERED BY: CHUTUAPE, ARTHU  SOURCE: Blood, Venipuncture peripheral               COLLECTED:  01/13/17 16:46  ANTIBIOTICS AT COLL.:                                RECEIVED :  01/13/17 20:15  Culture Blood Aerobic and Anaerobic        FINAL       01/18/17 22:21  01/18/17   No growth after 5 days of incubation.      MRSA Culture [[id number]] Collected:  01/08/17 1029    Specimen:  Body Fluid from Nasal/Throat ASC Admission Updated:  01/09/17 1653    Narrative:       ORDER#: [id number]                                    ORDERED BY: Lehman Prom  SOURCE: Nares and Throat                             COLLECTED:  01/08/17 10:29  ANTIBIOTICS AT COLL.:                                 RECEIVED :  01/08/17 15:48  Culture MRSA Surveillance                  FINAL       01/09/17 16:53  01/09/17   Negative for Methicillin Resistant Staph aureus from Nares and             Negative for Methicillin Resistant Staph aureus from Throat      MRSA culture [[id number]] Collected:  01/09/17 0423    Specimen:  Body Fluid from Nasal/Throat ASC Admission Updated:  01/11/17 0950    Narrative:       ORDER#: [id number]                                    ORDERED BY: AL KHOURI, SAMI  SOURCE: Nares and Throat                             COLLECTED:  01/09/17 04:23  ANTIBIOTICS AT COLL.:  RECEIVED :  01/09/17 10:27  Culture MRSA Surveillance                  FINAL       01/11/17 09:50   +  01/11/17   Positive for Methicillin Resistant Staph aureus from Nares             Negative for Methicillin Resistant Staph aureus from Throat      Sputum Culture [[id number]] Collected:  01/08/17 1129    Specimen:  Sputum from Tracheal Aspirate Updated:  01/11/17 1448    Narrative:       ORDER#: [id number]                                    ORDERED BY: Lehman Prom  SOURCE: Tracheal Aspirate trach                      COLLECTED:  01/08/17 11:29  ANTIBIOTICS AT COLL.:                                RECEIVED :  01/08/17 16:01  Stain, Gram (Respiratory)                  FINAL       01/08/17 17:23  01/08/17   Few WBC's             Rare WBC's             Moderate Mixed Respiratory Flora  Culture and Gram Stain, Aerobic, RespiratorFINAL       01/11/17 14:48   +  01/09/17   Heavy growth of mixed upper respiratory flora  01/11/17   Heavy growth of Serratia marcescens               Morganella, Serratia, Providencia, Hafnia, Citrobacter, and             Enterobacter species may develop resistance while on therapy             with B-lactam antibiotics, even if initially susceptible, due             to derepression of AmpC B-lactamase. CLSI M100-S24,             [facility] Antimicrobial Subcommittee June  2015    01/11/17   Heavy growth of Pseudomonas aeruginosa      _____________________________________________________________________________                                  S.marcescens    P.aeruginosa    ANTIBIOTICS                     MIC  INTRP      MIC  INTRP      _____________________________________________________________________________  Amikacin                                        <=8    S        Amoxicillin/CA                 >16/8   R  Ampicillin                      >16    R                        Aztreonam                       <=2    S         4     S        Cefazolin                       >16    R                        Cefepime                        <=1    S         2     S        Ceftazidime                     <=2    S        <=2    S        Ceftriaxone                     <=1    S                        Ciprofloxacin                    2     I         2     I        Ertapenem                     <=0.25   S                        Gentamicin                      <=2    S        <=2    S        Levofloxacin                    <=1    S         4     I        Meropenem                     <=0.25   S         8     R        Piperacillin/Tazobactam        <=4/4   S        4/4    S        Tetracycline                    >8     R  Tobramycin                                      <=2    S        _____________________________________________________________________________            S=SUSCEPTIBLE     I=INTERMEDIATE     R=RESISTANT                            N/S=NON-SUSCEPTIBLE  _____________________________________________________________________________      Urine culture [[id number]] Collected:  01/19/17 1748    Specimen:  Urine from Urine, Catheterized, In & Out Updated:  01/20/17 1946    Narrative:       ORDER#: [id number]                                    ORDERED BY: Oval Linsey  SOURCE: Urine, Catheterized, In & Out                COLLECTED:  01/19/17  17:48  ANTIBIOTICS AT COLL.:                                RECEIVED :  01/19/17 21:45  Culture Urine                              FINAL       01/20/17 19:46  01/20/17   No growth of >1,000 CFU/ML, No further work      Urine culture [[id number]] Collected:  01/08/17 1129    Specimen:  Urine from Urine, Catheterized, In & Out Updated:  01/10/17 1517    Narrative:       ORDER#: [id number]                                    ORDERED BY: Lehman Prom  SOURCE: Urine, Catheterized, In & Out                COLLECTED:  01/08/17 11:29  ANTIBIOTICS AT COLL.:                                RECEIVED :  01/08/17 15:48  Culture Urine                              FINAL       01/10/17 15:17   +  01/10/17   >100,000 CFU/ML MDR Proteus mirabilis               Cefazolin results predict results for the oral agents             cefuroxime axetil, cephalexin, cefdinir, cefpodoxime, and             cefprozil when used for therapy of uncomplicated UTIs due to             E.coli, K. pneumonia, and P. mirabilis. CLSI M100 S25             This multidrug resistant (MDR) Enterobacteriaceae  is resistant             to ceftriaxone and may not respond optimally to B-lactam             antibiotics (excluding carbapenems).              System Antimicrobial Subcommittee June 2015    _____________________________________________________________________________                                MDR P.mirabilis   ANTIBIOTICS                     MIC  INTRP      _____________________________________________________________________________  Amikacin                        >32    R        Amoxicillin/CA                 <=4/2   S        Ampicillin                      >16    R        Aztreonam                       >16    R        Cefazolin                       >16    R        Cefepime                        >16    R        Ceftazidime                     >16    R        Ceftriaxone                     >32    R        Ciprofloxacin                   >2      R        Ertapenem                     <=0.25   S        Gentamicin                      >8     R        Levofloxacin                    >4     R        Nitrofurantoin                  >64    R  D1    Piperacillin/Tazobactam        <=2/4   S        Tetracycline                    >8  R        Tobramycin                      >8     R        Trimethoprim/Sulfamethoxazole  >2/38   R          -----DRUG COMMENTS----------    D1:  Nitrofurantoin should only be used for the treatment of         uncomplicated cystitis.         [facility] Antimicrobial Subcommittee June 2015  _____________________________________________________________________________            S=SUSCEPTIBLE     I=INTERMEDIATE     R=RESISTANT                            N/S=NON-SUSCEPTIBLE  _____________________________________________________________________________                Discharge Medications:     Current Discharge Medication List      START taking these medications    Details   amoxicillin-clavulanate (AUGMENTIN) 400-57 MG/5ML suspension Take 6.3 mLs (500 mg total) by mouth 3 (three) times daily.for 14 days      simethicone (MYLICON) 80 MG chewable tablet 1 tablet (80 mg total) by per G tube route every 8 (eight) hours.         CONTINUE these medications which have CHANGED    Details   oxyCODONE-acetaminophen (PERCOCET) 5-325 MG per tablet 1 tablet by per G tube route every 6 (six) hours as needed for Pain.  Qty: 15 tablet, Refills: 0         CONTINUE these medications which have NOT CHANGED    Details   albuterol-ipratropium (DUO-NEB) 2.5-0.5(3) mg/3 mL nebulizer Take 3 mLs by nebulization 4 (four) times daily.In addition to every 4 hours as needed      amiodarone (PACERONE) 200 MG tablet Place 1 tablet (200 mg total) into feeding tube daily.      amLODIPine (NORVASC) 5 MG tablet TAKE ONE TABLET BY gastric tube EVERY DAY      Ascorbic Acid (VITAMIN C) 500 MG tablet 500 mg by per G tube route 2 (two) times daily.          atorvastatin  (LIPITOR) 20 MG tablet 20 mg by per G tube route nightly.          bumetanide (BUMEX) 0.5 MG tablet 0.5 mg by per G tube route daily.      furosemide (LASIX) 40 MG tablet 40 mg by per G tube route daily.      glycopyrrolate (ROBINUL) 1 MG tablet 1 mg by per G tube route daily.      metoclopramide (REGLAN) 5 MG tablet 5 mg by per G tube route every 8 (eight) hours.      metoprolol tartrate (LOPRESSOR) 25 MG tablet 25 mg by per G tube route 2 (two) times daily.      Multiple Vitamin (TAB-A-VITE) Tab 1 tablet daily.Via g tube      potassium chloride 20 MEQ/15ML (10%) oral solution 10 mEq by per G tube route daily.      rivaroxaban (XARELTO) 20 MG Tab 20 mg by per G tube route daily with dinner.      scopolamine (TRANSDERM-SCOP) 1.5 mg Place 1 patch onto the skin every third day.      senna (SENOKOT) 8.6 MG tablet 2 tablets by per G tube route  daily as needed for Constipation.      sodium hypochlorite (DAKIN'S HALF-STRENGTH) external solution Apply topically daily.      famotidine (PEPCID) 20 MG tablet Take 20 mg by mouth nightly.         STOP taking these medications       chlorhexidine (PERIDEX) 0.12 % solution              Physical Examination:     VITAL SIGNS   Temp:  [98.5 F (36.9 C)-98.9 F (37.2 C)] 98.6 F (37 C)  Heart Rate:  [83-122] 86  Resp Rate:  [17-30] 18  BP: (97-146)/(56-81) 146/68  FiO2:  [24 %-40 %] 40 %  POCT Glucose Result (Read Only)  Avg: 117.3  Min: 82  Max: 154  SpO2: 100 %    Intake/Output Summary (Last 24 hours) at 01/30/17 1004  Last data filed at 01/29/17 1700   Gross per 24 hour   Intake              400 ml   Output                0 ml   Net              400 ml          General appearance : alert, and in no distress  HEENT: pinkish conjunctiva, anicteric sclera, mist mucus membrane.  Neck : trach in situ, on cpap  Chest : clear to auscultation  Heart : S1 S2 heard, normal rate and regular rhythm  Abdomen : soft abdomen, non tender, Peg tube in situ, active bowel sounds.  Extremities :  no pedal edema  Neurological : Alert, follows command.      Discharge Instructions:     Disposition : [name]  Activity : as tolerated  Diet : tube feeding  Follow Up :  [name], MD  [address]  Medicine  [place] [address]    Follow up        Signed by: Ledora Bottcher, M.D.  Jani Files: [phone]

## 2017-01-30 NOTE — Progress Notes (Signed)
CM notified by bedside nurse that pt has been medically cleared for [place] to [facility] today. CM placed call to dtr [name] and dtr Erie Noe to notify family of the [place] and review IMM. Voicemail left, awaiting return call    Madie Reno, RN  Clinical Case Manager I  [phone]

## 2017-01-30 NOTE — Progress Note - Problem Oriented Charting Notewrit (Signed)
Patient was initially ordered for midline insertion to continue IV ABX at SNF upon discharge today. VARD RN noted upon evaluating, two negative blood cultures from 2/4 & 2/11.Notified Dr. Janalyn Rouse of this information. Per Dr. Janalyn Rouse, pt's IV abx to be discontinued and midline is cancelled.

## 2017-01-30 NOTE — Progress Notes (Signed)
Patient left the unit with PTS at 1802.

## 2017-01-30 NOTE — Progress Notes (Signed)
CM spoke w/ dtr Erie Noe to notify dtr of [place]. IMM reviewed, dtr given opportunity to ask questions, all questions answered to dtrs satisfaction, dtr does not wish to appeal at this time.    Madie Reno, RN  Clinical Case Manager I  [phone]

## 2017-01-30 NOTE — Plan of Care (Signed)
Problem: Inadequate Gas Exchange  Goal: Adequate oxygenation and improved ventilation  Outcome: Progressing   01/30/17 1610   Goal/Interventions addressed this shift   Adequate oxygenation and improved ventilation Assess lung sounds;Monitor SpO2 and treat as needed;Provide mechanical and oxygen support to facilitate gas exchange;Monitor and treat ETCO2;Position for maximum ventilatory efficiency;Increase activity as tolerated/progressive mobility;Plan activities to conserve energy: plan rest periods;Consult/collaborate with Respiratory Therapy       Problem: Hemodynamic Status: Cardiac  Goal: Stable vital signs and fluid balance  Outcome: Progressing   01/30/17 9604   Goal/Interventions addressed this shift   Stable vital signs and fluid balance Monitor/assess vital signs and telemetry per unit protocol;Weigh on admission and record weight daily;Assess signs and symptoms associated with cardiac rhythm changes;Monitor intake/output per unit protocol and/or LIP order;Monitor for leg swelling/edema and report to LIP if abnormal       Comments: Pt A&Ox1, VSS, sating above 95% on PRVC mode vent, Lung sounds Rhonchi, green and white secretion from inline, oral done. Abdomen soft but distended, BS distant, on promote G-tube feeding, 2 57ml/hr rate, tolerating well, no nausea or vomiting, 20mL residual noted. Had x1 large liquid BM, Pt passing gas. CHG bath perineal care done. Pt repositioned Q2Hrs. Trach care done, inline changed, new dressing in place, drsg C/D/I. Pt refused morning blood draw, made charge nurse, Trula Ore aware. Will update on coming nurse about AM blood draw refusal. All meds given through G-tuibe, no sign effect noted. Will continue to monitor Pt and provide care as appropriate.

## 2017-01-30 NOTE — Progress Notes (Addendum)
Infectious Diseases & Tropical Medicine  Progress Note    01/30/2017   [name] ZOX:[id number]:[id number] is a 83 y.o. male,       Assessment:      Encephalopathy.   Echocardiogram-no vegetation (12/2016)   Risk of aspiration   CT scan of the abdomen-possible right middle lobe infiltrate (01/26/2017)   Pro-calcitonin level-0.1 (01/27/2017)   Lactobacillus bacteremia   History of quadriparesis secondary to neck fracture.   Status post tracheostomy and G-tube placement.   Resident of [facility] nursing [facility]    Plan:      Continue Augmentin 2 weeks   Okay to discharge from infectious disease point of view   Continue probiotics.   Monitor electrolytes and renal functions closely.   Monitor clinically .   Discussed with pharmacy.   Discussed with Dr.Gebreyesus    ROS:     General: No fever, no chills, no rigor,sleepy but arousable  Respiratory: no cough, shortness of breath, or wheezing, Tracheostomy in place   Gastrointestinal: no diarrhea  Genito-Urinary: no hematuria  Musculoskeletal: no edema  Neurological: Sleepy  Dermatological: no rash    Physical Examination:     Blood pressure 146/68, pulse 86, temperature 98.6 F (37 C), temperature source Axillary, resp. rate 18, height 1.854 m (6' 0.99"), weight 103.9 kg (229 lb), SpO2 100 %.     General Appearance: Comfortable, and in no acute distress.   HEENT: Pupils are equal, round, and reactive to light.    Lungs: Decreased breath sounds   Heart: Regular rate and rhythm   Chest: Symmetric chest wall expansion.    Abdomen: Soft ,non tender,no hepatosplenomegaly,good bowel sounds   Neurological: Paraplegia   Extremities: No edema    Laboratory And Diagnostic Studies:     Recent Labs      01/29/17   0528  01/28/17   0424   WBC  12.61*  10.33   Hgb  9.3*  9.1*   Hematocrit  31.3*  30.1*   Platelets  104*  68*     Recent Labs      01/29/17   0528  01/28/17   0424   Sodium  145  144   Potassium  3.9  3.3*   Chloride  113*  112*   CO2  25  25    BUN  17.0  23.0   Creatinine  0.8  0.8   Glucose  101*  100   Calcium  8.3  8.4     No results for input(s): AST, ALT, ALKPHOS, PROT, ALB in the last 72 hours.    Current Meds:      Scheduled Meds: PRN Meds:        albuterol-ipratropium 3 mL Nebulization Q6H   amiodarone 200 mg Oral Daily   amoxicillin-clavulanate 500 mg of amoxicillin Oral TID   atorvastatin 20 mg per G tube QHS   balsam peru-castor oil (VENELEX)  Topical Q12H   famotidine 20 mg Intravenous Q12H SCH   glycopyrrolate 1 mg per G tube Daily   lactobacillus/streptococcus 1 capsule per G tube Daily   metoclopramide 5 mg per G tube Q8H   metoprolol tartrate 50 mg per G tube BID   QUEtiapine 25 mg Oral QHS   rivaroxaban 20 mg per G tube Daily with dinner   scopolamine 1 patch Transdermal Q72H   simethicone 80 mg per G tube Q8H [facility]       Continuous Infusions:     acetaminophen 650 mg Q4H PRN  Or     acetaminophen 650 mg Q4H PRN   naloxone 0.2 mg PRN   ondansetron 4 mg Q6H PRN   Or     ondansetron 4 mg Q6H PRN   oxyCODONE-acetaminophen 1 tablet Q6H PRN   promethazine 6.25 mg Q6H PRN   senna 17.2 mg QD PRN   sodium chloride 200 mL Q12H PRN         Johnisha Louks A. Janalyn Rouse, M.D.  01/30/2017  9:35 AM

## 2017-01-30 NOTE — Progress Notes (Signed)
Report called and given to [facility] RN at [place] at 1540. Expecting transport at 1600.

## 2017-01-30 NOTE — Progress Notes (Signed)
Patient has been cleared for discharge. Awaiting transport.

## 2017-01-30 NOTE — Progress Notes (Addendum)
01/30/17    D/c to [name] at 4 pm via ambulance. Room # 49 A, report # [id number].  PICC line for long-term IV antibiotics needs to be inserted prior discharge.  CM left VM for pt's daughter [name] regarding DCP.     Garry Heater, RN MSN  Clinical Care Manager  [phone]       01/30/17 1234   Discharge Disposition   Patient preference/choice provided? Yes   Physical Discharge Disposition SNF   Receiving facility, unit and room number: [name], room 49A   Nursing report phone number: [phone]   Mode of Transportation Ambulance   Pick up time 4pm    Patient/Family/POA notified of transfer plan Patient informed only   Patient agreeable to discharge plan/expected d/c date? Yes   Bedside nurse notified of transport plan? Yes   Hard copy of narcotic RX sent with patient? N/A   Hard copy of DNR/Advance Directive sent with patient? N/A   Advanced directive/code status rescreen completed before discharge?  No   IV antibiotics post discharge? Yes   CM Interventions   Follow up appointment scheduled? No   Reason no follow up scheduled? Other (comment)  (d/c to SNF)   Referral made for home health RN visit? No, Other (comment)  (d/c to SNF)   Multidisciplinary rounds/family meeting before d/c? Yes   Medicare Checklist   Is this a Medicare patient? Yes

## 2017-01-31 LAB — CBC
Absolute NRBC: 0 10*3/uL
Hematocrit: 33.1 % — ABNORMAL LOW (ref 42.0–52.0)
Hgb: 10.1 g/dL — ABNORMAL LOW (ref 13.0–17.0)
MCH: 26.2 pg — ABNORMAL LOW (ref 28.0–32.0)
MCHC: 30.5 g/dL — ABNORMAL LOW (ref 32.0–36.0)
MCV: 86 fL (ref 80.0–100.0)
MPV: 13 fL — ABNORMAL HIGH (ref 9.4–12.3)
Nucleated RBC: 0 /100 WBC (ref 0.0–1.0)
Platelets: 195 10*3/uL (ref 140–400)
RBC: 3.85 10*6/uL — ABNORMAL LOW (ref 4.70–6.00)
RDW: 17 % — ABNORMAL HIGH (ref 12–15)
WBC: 12.17 10*3/uL — ABNORMAL HIGH (ref 3.50–10.80)

## 2017-01-31 LAB — HEMOLYSIS INDEX: Hemolysis Index: 7 (ref 0–18)

## 2017-01-31 LAB — BASIC METABOLIC PANEL
BUN: 16 mg/dL (ref 9.0–28.0)
CO2: 27 mEq/L (ref 21–29)
Calcium: 8.2 mg/dL (ref 7.9–10.2)
Chloride: 105 mEq/L (ref 100–111)
Creatinine: 0.7 mg/dL (ref 0.5–1.5)
Glucose: 77 mg/dL (ref 70–100)
Potassium: 3.7 mEq/L (ref 3.5–5.1)
Sodium: 142 mEq/L (ref 136–145)

## 2017-01-31 LAB — GFR: EGFR: >60

## 2017-02-01 ENCOUNTER — Encounter (INDEPENDENT_AMBULATORY_CARE_PROVIDER_SITE_OTHER): Payer: Self-pay

## 2017-02-01 NOTE — Progress Notes (Signed)
TCM Medicare Navigator    Navigator will d/c pt as pt has completed the MFN program.      Shon Millet BS   TCM Medicare Focus navigator   [phone]

## 2017-02-16 LAB — CBC
Absolute NRBC: 0 10*3/uL
Hematocrit: 37.2 % — ABNORMAL LOW (ref 42.0–52.0)
Hgb: 11.9 g/dL — ABNORMAL LOW (ref 13.0–17.0)
MCH: 26.7 pg — ABNORMAL LOW (ref 28.0–32.0)
MCHC: 32 g/dL (ref 32.0–36.0)
MCV: 83.4 fL (ref 80.0–100.0)
MPV: 11.3 fL (ref 9.4–12.3)
Nucleated RBC: 0 /100 WBC (ref 0.0–1.0)
Platelets: 262 10*3/uL (ref 140–400)
RBC: 4.46 10*6/uL — ABNORMAL LOW (ref 4.70–6.00)
RDW: 17 % — ABNORMAL HIGH (ref 12–15)
WBC: 14.45 10*3/uL — ABNORMAL HIGH (ref 3.50–10.80)

## 2017-02-16 LAB — BASIC METABOLIC PANEL
BUN: 34 mg/dL — ABNORMAL HIGH (ref 9.0–28.0)
CO2: 26 mEq/L (ref 21–29)
Calcium: 9.1 mg/dL (ref 7.9–10.2)
Chloride: 96 mEq/L — ABNORMAL LOW (ref 100–111)
Creatinine: 0.7 mg/dL (ref 0.5–1.5)
Glucose: 76 mg/dL (ref 70–100)
Potassium: 4.3 mEq/L (ref 3.5–5.1)
Sodium: 136 mEq/L (ref 136–145)

## 2017-02-16 LAB — HEMOLYSIS INDEX: Hemolysis Index: 6 (ref 0–18)

## 2017-02-16 LAB — URINALYSIS
Bilirubin, UA: NEGATIVE
Blood, UA: NEGATIVE
Glucose, UA: NEGATIVE
Ketones UA: NEGATIVE
Leukocyte Esterase, UA: NEGATIVE
Nitrite, UA: NEGATIVE
Protein, UR: NEGATIVE
Specific Gravity UA: 1.01 (ref 1.001–1.035)
Urine pH: 6 (ref 5.0–8.0)
Urobilinogen, UA: 0.2

## 2017-02-16 LAB — GFR: EGFR: >60

## 2017-02-18 ENCOUNTER — Inpatient Hospital Stay
Admission: EM | Admit: 2017-02-18 | Discharge: 2017-02-22 | DRG: 091 | Disposition: A | Discharge status: Other Acute Inpatient Hospital | Payer: Medicare Other | Attending: Internal Medicine | Admitting: Internal Medicine

## 2017-02-18 ENCOUNTER — Emergency Department: Payer: Medicare Other

## 2017-02-18 ENCOUNTER — Inpatient Hospital Stay: Payer: Medicare Other | Admitting: Pulmonary Disease

## 2017-02-18 DIAGNOSIS — Z930 Tracheostomy status: Secondary | ICD-10-CM

## 2017-02-18 DIAGNOSIS — I959 Hypotension, unspecified: Secondary | ICD-10-CM | POA: Diagnosis present

## 2017-02-18 DIAGNOSIS — H9190 Unspecified hearing loss, unspecified ear: Secondary | ICD-10-CM | POA: Diagnosis present

## 2017-02-18 DIAGNOSIS — G931 Anoxic brain damage, not elsewhere classified: Principal | ICD-10-CM | POA: Diagnosis present

## 2017-02-18 DIAGNOSIS — Z7901 Long term (current) use of anticoagulants: Secondary | ICD-10-CM

## 2017-02-18 DIAGNOSIS — Z9911 Dependence on respirator [ventilator] status: Secondary | ICD-10-CM

## 2017-02-18 DIAGNOSIS — N390 Urinary tract infection, site not specified: Secondary | ICD-10-CM | POA: Diagnosis present

## 2017-02-18 DIAGNOSIS — S12100S Unspecified displaced fracture of second cervical vertebra, sequela: Secondary | ICD-10-CM

## 2017-02-18 DIAGNOSIS — I482 Chronic atrial fibrillation: Secondary | ICD-10-CM | POA: Diagnosis present

## 2017-02-18 DIAGNOSIS — L89159 Pressure ulcer of sacral region, unspecified stage: Secondary | ICD-10-CM | POA: Diagnosis present

## 2017-02-18 DIAGNOSIS — J90 Pleural effusion, not elsewhere classified: Secondary | ICD-10-CM | POA: Diagnosis present

## 2017-02-18 DIAGNOSIS — J9611 Chronic respiratory failure with hypoxia: Secondary | ICD-10-CM | POA: Diagnosis present

## 2017-02-18 DIAGNOSIS — E785 Hyperlipidemia, unspecified: Secondary | ICD-10-CM | POA: Diagnosis present

## 2017-02-18 DIAGNOSIS — Z931 Gastrostomy status: Secondary | ICD-10-CM

## 2017-02-18 DIAGNOSIS — R4182 Altered mental status, unspecified: Secondary | ICD-10-CM | POA: Diagnosis present

## 2017-02-18 DIAGNOSIS — G8250 Quadriplegia, unspecified: Secondary | ICD-10-CM | POA: Diagnosis present

## 2017-02-18 DIAGNOSIS — I10 Essential (primary) hypertension: Secondary | ICD-10-CM | POA: Diagnosis present

## 2017-02-18 DIAGNOSIS — D72829 Elevated white blood cell count, unspecified: Secondary | ICD-10-CM | POA: Diagnosis present

## 2017-02-18 DIAGNOSIS — E860 Dehydration: Secondary | ICD-10-CM | POA: Diagnosis present

## 2017-02-18 DIAGNOSIS — Z79899 Other long term (current) drug therapy: Secondary | ICD-10-CM

## 2017-02-18 DIAGNOSIS — I429 Cardiomyopathy, unspecified: Secondary | ICD-10-CM | POA: Diagnosis present

## 2017-02-18 LAB — URINALYSIS, REFLEX TO MICROSCOPIC EXAM IF INDICATED
Bilirubin, UA: NEGATIVE
Blood, UA: NEGATIVE
Glucose, UA: NEGATIVE
Ketones UA: NEGATIVE
Leukocyte Esterase, UA: NEGATIVE
Nitrite, UA: NEGATIVE
Protein, UR: NEGATIVE
Specific Gravity UA: 1.012 (ref 1.001–1.035)
Urine pH: 5 (ref 5.0–8.0)
Urobilinogen, UA: NEGATIVE mg/dL

## 2017-02-18 LAB — CBC AND DIFFERENTIAL
Absolute NRBC: 0 10*3/uL
Basophils Absolute Automated: 0.08 10*3/uL (ref 0.00–0.20)
Basophils Automated: 0.4 %
Eosinophils Absolute Automated: 0.32 10*3/uL (ref 0.00–0.70)
Eosinophils Automated: 1.7 %
Hematocrit: 38.5 % — ABNORMAL LOW (ref 42.0–52.0)
Hgb: 12.2 g/dL — ABNORMAL LOW (ref 13.0–17.0)
Immature Granulocytes Absolute: 0.14 10*3/uL — ABNORMAL HIGH
Immature Granulocytes: 0.7 %
Lymphocytes Absolute Automated: 1.29 10*3/uL (ref 0.50–4.40)
Lymphocytes Automated: 6.7 %
MCH: 26.2 pg — ABNORMAL LOW (ref 28.0–32.0)
MCHC: 31.7 g/dL — ABNORMAL LOW (ref 32.0–36.0)
MCV: 82.6 fL (ref 80.0–100.0)
MPV: 9.8 fL (ref 9.4–12.3)
Monocytes Absolute Automated: 1.44 10*3/uL — ABNORMAL HIGH (ref 0.00–1.20)
Monocytes: 7.5 %
Neutrophils Absolute: 16.01 10*3/uL — ABNORMAL HIGH (ref 1.80–8.10)
Neutrophils: 83 %
Nucleated RBC: 0 /100 WBC (ref 0.0–1.0)
Platelets: 374 10*3/uL (ref 140–400)
RBC: 4.66 10*6/uL — ABNORMAL LOW (ref 4.70–6.00)
RDW: 17 % — ABNORMAL HIGH (ref 12–15)
WBC: 19.28 10*3/uL — ABNORMAL HIGH (ref 3.50–10.80)

## 2017-02-18 LAB — COMPREHENSIVE METABOLIC PANEL
ALT: 22 U/L (ref 0–55)
AST (SGOT): 16 U/L (ref 5–34)
Albumin/Globulin Ratio: 0.8 — ABNORMAL LOW (ref 0.9–2.2)
Albumin: 3.2 g/dL — ABNORMAL LOW (ref 3.5–5.0)
Alkaline Phosphatase: 81 U/L (ref 38–106)
Anion Gap: 12 (ref 5.0–15.0)
BUN: 35 mg/dL — ABNORMAL HIGH (ref 9.0–28.0)
Bilirubin, Total: 0.5 mg/dL (ref 0.2–1.2)
CO2: 29 mEq/L (ref 22–29)
Calcium: 10.3 mg/dL — ABNORMAL HIGH (ref 7.9–10.2)
Chloride: 96 mEq/L — ABNORMAL LOW (ref 100–111)
Creatinine: 0.8 mg/dL (ref 0.7–1.3)
Globulin: 4 g/dL — ABNORMAL HIGH (ref 2.0–3.6)
Glucose: 92 mg/dL (ref 70–100)
Potassium: 4.2 mEq/L (ref 3.5–5.1)
Protein, Total: 7.2 g/dL (ref 6.0–8.3)
Sodium: 137 mEq/L (ref 136–145)

## 2017-02-18 LAB — HEMOLYSIS INDEX: Hemolysis Index: 0 (ref 0–18)

## 2017-02-18 LAB — TROPONIN I: Troponin I: <0.01 ng/mL (ref 0.00–0.09)

## 2017-02-18 LAB — LACTIC ACID, PLASMA: Lactic Acid: 1 mmol/L (ref 0.2–2.0)

## 2017-02-18 LAB — GFR: EGFR: >60

## 2017-02-18 MED ORDER — SODIUM CHLORIDE 0.9 % IV SOLN
1.0000 g | Freq: Once | INTRAVENOUS | Status: AC
Start: 2017-02-18 — End: 2017-02-19
  Administered 2017-02-19: 01:00:00 1 g via INTRAVENOUS
  Filled 2017-02-18: qty 1000

## 2017-02-18 MED ORDER — DILTIAZEM HCL 5 MG/ML IV SOLN (WRAP)
5.0000 mg/h | INTRAVENOUS | Status: DC
Start: 2017-02-18 — End: 2017-02-19

## 2017-02-18 MED ORDER — SODIUM CHLORIDE 0.9 % IV BOLUS
500.0000 mL | Freq: Once | INTRAVENOUS | Status: AC
Start: 2017-02-18 — End: 2017-02-19
  Administered 2017-02-18: 500 mL via INTRAVENOUS

## 2017-02-18 NOTE — ED Triage Notes (Signed)
[name] is a 83 y.o. male BIBA from [facility] with the complaint of AMS. Per Staff, pt was agitated "trying to pull off his vent". En Route Vitals BP 110/80, Dexi- 121, HR 99  Blood pressure 122/56, pulse 100, temperature 98.7 F (37.1 C), temperature source Oral, resp. rate 16, SpO2 99 %.

## 2017-02-18 NOTE — ED Provider Notes (Addendum)
EMERGENCY DEPARTMENT HISTORY AND PHYSICAL EXAM     Physician/Midlevel provider first contact with patient: 02/18/17 1936         Date: 02/18/2017  Patient Name: [name]      Provider Assessment       Provider Assessment: 83 y.o. male with AMS, seems to be getting worse over the past few days.  Noted recent urine culture with growth of proteus, prior was very resistant to many abx excepting ertapenem, Augmentin and Zosyn. Has been on Augmentin but WBC has been increasing - hospitalize and switch to ertapenem. Consulted pulmonary for vent management - CXR has been improving and on difficulty with oxygenation or change in settings. Placed on dilt gtt for slight afib RVR.       History of Presenting Illness     History Provided By: Daughter, son-in-law    Preferred Language: English     The HPI was unable to be obtained or is limited due to inability to communicate and AMS.    Chief Complaint: Altered mental status  Onset: A few days ago  Timing: Gradual  Location: Global  Quality: Confusion  Severity: Moderate   Modifying Factors: None  Associated Symptoms: None  Pertinent Negatives: Pain, trouble breathing, change in vent settings    Additional History: [name] is a 83 y.o. male presenting to the ED with AMS. The patient was in his usual state of health until a few days ago. Daughter notes he has been more confused and nursing at [facility] was concerned that he was trying to pull out his trach. Patient denies pain (able to communicate a bit with cuff deflated). Patient very hard of hearing.    PCP: [name], MD      Current Facility-Administered Medications   Medication Dose Route Frequency Provider Last Rate Last Dose   . dilTIAZem (CARDIZEM) 125 mg in dextrose 5 % 125 mL IVPB  5 mg/hr Intravenous Continuous Theresia Bough, MD 5 mL/hr at 02/18/17 2332 5 mg/hr at 02/18/17 2332   . ertapenem (INVanz) 1 g in sodium chloride 0.9 % 100 mL IVPB  1 g Intravenous Once Theresia Bough,  MD         Current Outpatient Prescriptions   Medication Sig Dispense Refill   . acetaminophen (TYLENOL) 160 MG/5ML suspension Give 20.3 mL via gastric tube every 6 hours as needed     . albuterol-ipratropium (DUO-NEB) 2.5-0.5(3) mg/3 mL nebulizer Take 3 mLs by nebulization 4 (four) times daily.In addition to every 4 hours as needed     . amiodarone (PACERONE) 200 MG tablet Place 1 tablet (200 mg total) into feeding tube daily.     Marland Kitchen amLODIPine (NORVASC) 5 MG tablet TAKE ONE TABLET BY gastric tube EVERY DAY     . Ascorbic Acid (VITAMIN C) 500 MG tablet 500 mg by per G tube route 2 (two) times daily.         Marland Kitchen atorvastatin (LIPITOR) 20 MG tablet 20 mg by per G tube route nightly.         . bumetanide (BUMEX) 0.5 MG tablet 0.5 mg by per G tube route daily.     . cefTRIAXone (ROCEPHIN) 1 g injection Inject 1 g into the muscle daily.     . famotidine (PEPCID) 20 MG tablet 20 mg by per G tube route nightly.         . furosemide (LASIX) 40 MG tablet 40 mg by per G tube route daily.     Marland Kitchen  glycopyrrolate (ROBINUL) 1 MG tablet 1 mg by per G tube route daily.     . metoclopramide (REGLAN) 5 MG tablet 5 mg by per G tube route every 8 (eight) hours.     . metoprolol tartrate (LOPRESSOR) 25 MG tablet 25 mg by per G tube route 2 (two) times daily.     . Multiple Vitamin (TAB-A-VITE) Tab 1 tablet daily.Via g tube     . potassium chloride 20 MEQ/15ML (10%) oral solution 10 mEq by per G tube route daily.     . QUEtiapine (SEROQUEL) 25 MG tablet 25 mg by per G tube route 2 (two) times daily.     . rivaroxaban (XARELTO) 20 MG Tab 20 mg by per G tube route daily with dinner.     Marland Kitchen scopolamine (TRANSDERM-SCOP) 1.5 mg Place 1 patch onto the skin every third day.     . senna (SENOKOT) 8.6 MG tablet 2 tablets by per G tube route daily as needed for Constipation.     . simethicone (MYLICON) 80 MG chewable tablet 1 tablet (80 mg total) by per G tube route every 8 (eight) hours.         Past History     Past Medical History:  Past Medical  History:   Diagnosis Date   . C2 cervical fracture    . Chronic pain    . Conjunctivitis    . Constipation    . Cutaneous abscess of buttock    . Dysphagia    . Hypertension    . Insomnia    . Kidney disorder    . Respiratory arrest        Past Surgical History:  Past Surgical History:   Procedure Laterality Date   . GASTROSTOMY TUBE PLACEMENT     . TRACHEOSTOMY         Family History:  No family history on file.    Social History:  Social History   Substance Use Topics   . Smoking status: Never Smoker   . Smokeless tobacco: Never Used   . Alcohol use No       Allergies:  Allergies   Allergen Reactions   . Dilaudid [Hydromorphone Hcl]        Review of Systems     Review of Systems   Unable to perform ROS: Other         Physical Exam   BP 146/69   Pulse (!) 118   Temp 99.5 F (37.5 C) (Rectal)   Resp (!) 24   SpO2 100%     Physical Examination:    General appearance - Well appearing, not in distress, elderly, chronically ill appearing  Eyes - Pupils equal, no discharge  ENT - Moist mucous membranes, OP normal, trach in place  Heart - Tachycardic, irregular  Chest - Normal work of breathing, lungs clear to auscultation bilaterally  Abdomen - Soft, nontender, non-distended, G-tube in place, no urinary catheter  Neurological - Alert, moves all extremities, gestures wildly with hands, appears to be talking  Psychiatric - Confused, poor insight (able to talk with cuff deflated - is confused, does not form complete coherent thoughts). His first comment when he can talk is that he is OK because there are 4 beautiful men taking care of him and had no other complaints. Then made further nonsensical statements. Did appear to recognized son-in-law but then confused me with his family member.  Musculoskeletal - No deformity or tenderness  Extremities - No pedal edema,  distal blood flow grossly normal  Skin - Normal coloration, no rashes where visualized       Diagnostic Study Results     Labs -     Results     Procedure  Component Value Units Date/Time    Troponin I [[id number]] Collected:  02/18/17 2233    Specimen:  Blood Updated:  02/18/17 2329     Troponin I <0.01 ng/mL     Comprehensive metabolic panel [[id number]]  (Abnormal) Collected:  02/18/17 2233    Specimen:  Blood Updated:  02/18/17 2322     Glucose 92 mg/dL      BUN 78.2 (H) mg/dL      Creatinine 0.8 mg/dL      Sodium 956 mEq/L      Potassium 4.2 mEq/L      Chloride 96 (L) mEq/L      CO2 29 mEq/L      Calcium 10.3 (H) mg/dL      Protein, Total 7.2 g/dL      Albumin 3.2 (L) g/dL      AST (SGOT) 16 U/L      ALT 22 U/L      Alkaline Phosphatase 81 U/L      Bilirubin, Total 0.5 mg/dL      Globulin 4.0 (H) g/dL      Albumin/Globulin Ratio 0.8 (L)     Anion Gap 12.0    Hemolysis index [[id number]] Collected:  02/18/17 2233     Updated:  02/18/17 2322     Hemolysis Index 0    GFR [[id number]] Collected:  02/18/17 2233     Updated:  02/18/17 2322     EGFR >60.0    UA, Reflex to Microscopic (pts 3 + yrs) [[id number]] Collected:  02/18/17 2233    Specimen:  Urine Updated:  02/18/17 2315     Urine Type Clean Catch     Color, UA Yellow     Clarity, UA Hazy     Specific Gravity UA 1.012     Urine pH 5.0     Leukocyte Esterase, UA Negative     Nitrite, UA Negative     Protein, UR Negative     Glucose, UA Negative     Ketones UA Negative     Urobilinogen, UA Negative mg/dL      Bilirubin, UA Negative     Blood, UA Negative     RBC, UA 0 - 2 /hpf      WBC, UA 0 - 5 /hpf     CBC with differential [[id number]]  (Abnormal) Collected:  02/18/17 2233    Specimen:  Blood from Blood Updated:  02/18/17 2312     WBC 19.28 (H) x10 3/uL      Hgb 12.2 (L) g/dL      Hematocrit 03.4 (L) %      Platelets 374 x10 3/uL      RBC 4.66 (L) x10 6/uL      MCV 82.6 fL      MCH 26.2 (L) pg      MCHC 31.7 (L) g/dL      RDW 17 (H) %      MPV 9.8 fL      Neutrophils 83.0 %      Lymphocytes Automated 6.7 %      Monocytes 7.5 %      Eosinophils Automated 1.7 %      Basophils Automated 0.4 %      Immature Granulocyte  0.7 %  Nucleated RBC 0.0 /100 WBC      Neutrophils Absolute 16.01 (H) x10 3/uL      Abs Lymph Automated 1.29 x10 3/uL      Abs Mono Automated 1.44 (H) x10 3/uL      Abs Eos Automated 0.32 x10 3/uL      Absolute Baso Automated 0.08 x10 3/uL      Absolute Immature Granulocyte 0.14 (H) x10 3/uL      Absolute NRBC 0.00 x10 3/uL     Blood Culture Aerobic/Anaerobic #1 [[id number]] Collected:  02/18/17 2233    Specimen:  Arm from Blood, Venipuncture Updated:  02/18/17 2305    Narrative:       1 BLUE+1 PURPLE    Blood Culture Aerobic/Anaerobic #2 [[id number]] Collected:  02/18/17 2233    Specimen:  Arm from Blood, Venipuncture Updated:  02/18/17 2305    Narrative:       1 BLUE+1 PURPLE    MRSA Culture [[id number]] Collected:  02/18/17 2233    Specimen:  Body Fluid from Nares and Throat Updated:  02/18/17 2305    Lactic acid [[id number]] Collected:  02/18/17 2230    Specimen:  Blood Updated:  02/18/17 2241     Lactic acid 1.0 mmol/L           Radiologic Studies -   Radiology Results (24 Hour)     Procedure Component Value Units Date/Time    CT Head WO Contrast [[id number]] Collected:  02/18/17 2214    Order Status:  Completed Updated:  02/18/17 2219    Narrative:       CLINICAL INDICATION:  Altered mental status    TECHNIQUE:  Axial noncontrast CT images were obtained from the skull  base to the vertex. A combination of a automatic exposure control,  adjustment of the mA and/or kV  according to patient size and/or use of  iterative reconstruction technique was utilized.      COMPARISON:  01/08/2017    INTERPRETATION: There is no acute intracranial hemorrhage or extra axial  fluid collection. There is periventricular low-attenuation, consistent  with chronic small vessel ischemic disease. There is no midline shift or  mass effect. The gray-white matter differentiation is normal.    Visualized paranasal sinuses are clear      Impression:         No acute intracranial abnormality.    Wyatt Portela, MD   02/18/2017 10:15 PM    XR  Chest  AP Portable [[id number]] Collected:  02/18/17 2102    Order Status:  Completed Updated:  02/18/17 2106    Narrative:       CLINICAL INDICATION: AMS     COMPARISON: 01/24/2017    INTERPRETATION:   A single frontal view of the chest was obtained. .     Tracheostomy cannula is in position. The cardiomediastinal contour  is  within normal limits for age. There are small bilateral pleural  effusions and bibasilar airspace disease. This appears not significantly  changed from 01/24/2017.          Impression:         Stable probable small bilateral pleural effusions and  bibasilar airspace disease.    Wyatt Portela, MD   02/18/2017 9:02 PM      .    Medical Decision Making   I am the first provider for this patient.    I reviewed the vital signs, available nursing notes, past medical history, past surgical history, family history and social history.  Vital Signs-Reviewed the patient's vital signs.     Patient Vitals for the past 12 hrs:   BP Temp Pulse Resp   02/19/17 0000 146/69 - (!) 118 (!) 24   02/18/17 2321 116/65 - (!) 105 18   02/18/17 2300 112/60 99.5 F (37.5 C) 92 17   02/18/17 2102 135/62 - (!) 124 (!) 27   02/18/17 2000 115/71 - (!) 135 -   02/18/17 1933 122/56 98.7 F (37.1 C) 100 16       Pulse Oximetry Analysis - Normal 99% on vent.      EKG:  Interpreted by the EP.   Time Interpreted: 2004   Rate: 107   Rhythm: Atrial Fibrillation with RVR   QTc: 499   Interpretation: No repolarization abnormalities   Comparison: 01/08/2017 similar        Critical Care Time: CRITICAL CARE: The high probability of sudden, clinically significant deterioration in the patient's condition required the highest level of my preparedness to intervene urgently.    The services I provided to this patient were to treat and/or prevent clinically significant deterioration that could result in: death  Services included the following: chart data review, reviewing nursing notes and/or old charts, documentation time, consultant  collaboration regarding findings and treatment options, medication orders and management, direct patient care, re-evaluations, vital sign assessments and ordering, interpreting and reviewing diagnostic studies/lab tests.    Aggregate critical care time was 35 minutes, which includes only time during which I was engaged in work directly related to the patient's care, as described above, whether at the bedside or elsewhere in the Emergency Department.  It did not include time spent performing other reported procedures or the services of residents, students, nurses or physician assistants.        Old Medical Records: Hospitalized on January 30 due to AMS. D/C on the 21st to [facility]. Diagnosis of pneumonia and UTI. Had a SBO which resolved without surgical intervention. Been on Augmentin as an outpatient for UTI/pneumonia. Started on Rocephin today. Chest x ray done 2 days ago and was improving.     Admit Decision Time:  The decision to admit this patient was made by the emergency provider at 1213 on 02/19/17.      ED Course:     9:04 PM - Spoke with her daughter, states he has been more agitated and not himself the last few days. She states he is a full code and wants him to get better.     11:38 PM - Discussed pt case with Dr. Graciela Husbands, intensivist, who will consult - needs unit 2700 due to chronic vent status.    11:39 PM - Discussed pt case with Dr. [name], internal medicine, who accepts the patient on behalf of Dr. [name].    12:05 PM - Discussed plan for admission with patients daughter and test results. Agrees with plan.         Diagnosis     Clinical Impression:   1. Altered mental status        Treatment Plan:   ED Disposition     ED Disposition Condition Date/Time Comment    Admit  Tue Feb 19, 2017 12:13 AM Admitting Physician: [name], [name] [[id number]]   Diagnosis: Altered mental status [780.97.ICD-9-CM]   Estimated Length of Stay: > or = to 2 midnights   Tentative Discharge Plan?: Home or Self Care  [1]   Patient Class: Inpatient [101]   Bed request comments: 2700  _______________________________      This note is prepared by  Burman Foster acting as Scribe for Charlott Holler, MD.    Charlott Holler, MD.  The scribe's documentation has been prepared under my direction and personally reviewed by me in its entirety.  I confirm that the note above accurately reflects all work, treatment, procedures, and medical decision making performed by me.  _______________________________       Theresia Bough, MD  02/19/17 1108       Theresia Bough, MD  02/19/17 1109

## 2017-02-18 NOTE — ED Notes (Signed)
Bed: PU34  Expected date:   Expected time:   Means of arrival:   Comments:  m,207

## 2017-02-19 ENCOUNTER — Inpatient Hospital Stay: Payer: Medicare Other

## 2017-02-19 LAB — BASIC METABOLIC PANEL
Anion Gap: 13 (ref 5.0–15.0)
BUN: 33 mg/dL — ABNORMAL HIGH (ref 9.0–28.0)
CO2: 25 mEq/L (ref 22–29)
Calcium: 9.7 mg/dL (ref 7.9–10.2)
Chloride: 98 mEq/L — ABNORMAL LOW (ref 100–111)
Creatinine: 0.8 mg/dL (ref 0.7–1.3)
Glucose: 91 mg/dL (ref 70–100)
Potassium: 4.2 mEq/L (ref 3.5–5.1)
Sodium: 136 mEq/L (ref 136–145)

## 2017-02-19 LAB — CBC AND DIFFERENTIAL
Absolute NRBC: 0 10*3/uL
Basophils Absolute Automated: 0.06 10*3/uL (ref 0.00–0.20)
Basophils Automated: 0.4 %
Eosinophils Absolute Automated: 0.31 10*3/uL (ref 0.00–0.70)
Eosinophils Automated: 2 %
Hematocrit: 39.3 % — ABNORMAL LOW (ref 42.0–52.0)
Hgb: 12.8 g/dL — ABNORMAL LOW (ref 13.0–17.0)
Immature Granulocytes Absolute: 0.1 10*3/uL — ABNORMAL HIGH
Immature Granulocytes: 0.7 %
Lymphocytes Absolute Automated: 1.59 10*3/uL (ref 0.50–4.40)
Lymphocytes Automated: 10.5 %
MCH: 27.1 pg — ABNORMAL LOW (ref 28.0–32.0)
MCHC: 32.6 g/dL (ref 32.0–36.0)
MCV: 83.3 fL (ref 80.0–100.0)
MPV: 9.6 fL (ref 9.4–12.3)
Monocytes Absolute Automated: 1.34 10*3/uL — ABNORMAL HIGH (ref 0.00–1.20)
Monocytes: 8.9 %
Neutrophils Absolute: 11.73 10*3/uL — ABNORMAL HIGH (ref 1.80–8.10)
Neutrophils: 77.5 %
Nucleated RBC: 0 /100 WBC (ref 0.0–1.0)
Platelets: 285 10*3/uL (ref 140–400)
RBC: 4.72 10*6/uL (ref 4.70–6.00)
RDW: 17 % — ABNORMAL HIGH (ref 12–15)
WBC: 15.13 10*3/uL — ABNORMAL HIGH (ref 3.50–10.80)

## 2017-02-19 LAB — ECG 12-LEAD
Atrial Rate: 250 {beats}/min
Atrial Rate: 90 {beats}/min
Q-T Interval: 388 ms
Q-T Interval: 374 ms
QRS Duration: 98 ms
QRS Duration: 94 ms
QTC Calculation (Bezet): 505 ms
QTC Calculation (Bezet): 499 ms
R Axis: 18 degrees
R Axis: 30 degrees
T Axis: 30 degrees
T Axis: 29 degrees
Ventricular Rate: 102 {beats}/min
Ventricular Rate: 107 {beats}/min

## 2017-02-19 LAB — HEMOLYSIS INDEX: Hemolysis Index: 30 — ABNORMAL HIGH (ref 0–18)

## 2017-02-19 LAB — URINALYSIS WITH MICROSCOPIC
Bilirubin, UA: NEGATIVE
Blood, UA: NEGATIVE
Glucose, UA: NEGATIVE
Ketones UA: NEGATIVE
Leukocyte Esterase, UA: NEGATIVE
Nitrite, UA: NEGATIVE
Protein, UR: NEGATIVE
Specific Gravity UA: 1.02 (ref 1.001–1.035)
Urine pH: 5 (ref 5.0–8.0)
Urobilinogen, UA: NEGATIVE mg/dL

## 2017-02-19 LAB — GFR: EGFR: >60

## 2017-02-19 LAB — GLUCOSE WHOLE BLOOD - POCT: Whole Blood Glucose POCT: 108 mg/dL — ABNORMAL HIGH (ref 70–100)

## 2017-02-19 LAB — B-TYPE NATRIURETIC PEPTIDE: B-Natriuretic Peptide: 148 pg/mL — ABNORMAL HIGH (ref 0–100)

## 2017-02-19 MED ORDER — RIVAROXABAN 20 MG PO TABS
20.0000 mg | ORAL_TABLET | Freq: Every day | ORAL | Status: DC
Start: 2017-02-19 — End: 2017-02-23
  Administered 2017-02-19 – 2017-02-22 (×4): 20 mg via GASTROSTOMY
  Filled 2017-02-19 (×5): qty 1

## 2017-02-19 MED ORDER — METOCLOPRAMIDE HCL 5 MG/5ML PO SOLN
5.0000 mg | Freq: Three times a day (TID) | ORAL | Status: DC
Start: 2017-02-19 — End: 2017-02-23
  Administered 2017-02-19 – 2017-02-22 (×11): 5 mg via GASTROSTOMY
  Filled 2017-02-19 (×2): qty 10
  Filled 2017-02-19: qty 5
  Filled 2017-02-19 (×6): qty 10
  Filled 2017-02-19: qty 5
  Filled 2017-02-19 (×3): qty 10
  Filled 2017-02-19 (×2): qty 5
  Filled 2017-02-19 (×2): qty 10

## 2017-02-19 MED ORDER — NYSTATIN 100000 UNIT/ML MT SUSP
400000.0000 [IU] | Freq: Four times a day (QID) | OROMUCOSAL | Status: DC
Start: 2017-02-19 — End: 2017-02-23
  Administered 2017-02-19 – 2017-02-22 (×9): 400000 [IU] via ORAL
  Filled 2017-02-19 (×22): qty 5

## 2017-02-19 MED ORDER — BISACODYL 10 MG RE SUPP
10.0000 mg | Freq: Every day | RECTAL | Status: DC | PRN
Start: 2017-02-19 — End: 2017-02-23

## 2017-02-19 MED ORDER — VITAMIN C 500 MG PO TABS
500.0000 mg | ORAL_TABLET | Freq: Two times a day (BID) | ORAL | Status: DC
Start: 2017-02-19 — End: 2017-02-23
  Administered 2017-02-19 – 2017-02-22 (×8): 500 mg via GASTROSTOMY
  Filled 2017-02-19 (×8): qty 1

## 2017-02-19 MED ORDER — SCOPOLAMINE 1 MG/3DAYS TD PT72
1.0000 | MEDICATED_PATCH | TRANSDERMAL | Status: DC
Start: 2017-02-19 — End: 2017-02-23
  Administered 2017-02-19 – 2017-02-22 (×2): 1 via TRANSDERMAL
  Filled 2017-02-19 (×2): qty 1

## 2017-02-19 MED ORDER — SENNA 8.6 MG PO TABS
17.2000 mg | ORAL_TABLET | Freq: Every day | ORAL | Status: DC | PRN
Start: 2017-02-19 — End: 2017-02-23

## 2017-02-19 MED ORDER — ACETAMINOPHEN 160 MG/5ML PO SOLN
650.0000 mg | Freq: Four times a day (QID) | ORAL | Status: DC | PRN
Start: 2017-02-19 — End: 2017-02-23

## 2017-02-19 MED ORDER — AMLODIPINE BESYLATE 5 MG PO TABS
5.0000 mg | ORAL_TABLET | Freq: Every day | ORAL | Status: DC
Start: 2017-02-19 — End: 2017-02-21
  Administered 2017-02-20: 10:00:00 5 mg via GASTROSTOMY
  Filled 2017-02-19 (×3): qty 1

## 2017-02-19 MED ORDER — SODIUM CHLORIDE 0.9 % IV SOLN
INTRAVENOUS | Status: DC
Start: 2017-02-19 — End: 2017-02-23
  Administered 2017-02-21: 09:00:00 60 mL/h via INTRAVENOUS

## 2017-02-19 MED ORDER — SIMETHICONE 80 MG PO CHEW
80.0000 mg | CHEWABLE_TABLET | Freq: Three times a day (TID) | ORAL | Status: DC
Start: 2017-02-19 — End: 2017-02-23
  Administered 2017-02-19 – 2017-02-22 (×11): 80 mg via GASTROSTOMY
  Filled 2017-02-19 (×12): qty 1

## 2017-02-19 MED ORDER — SODIUM CHLORIDE 0.9 % IV BOLUS
250.0000 mL | Freq: Once | INTRAVENOUS | Status: AC
Start: 2017-02-19 — End: 2017-02-19
  Administered 2017-02-19: 12:00:00 250 mL via INTRAVENOUS

## 2017-02-19 MED ORDER — QUETIAPINE FUMARATE 25 MG PO TABS
25.0000 mg | ORAL_TABLET | Freq: Two times a day (BID) | ORAL | Status: DC
Start: 2017-02-19 — End: 2017-02-23
  Administered 2017-02-19 – 2017-02-22 (×7): 25 mg via GASTROSTOMY
  Filled 2017-02-19 (×7): qty 1

## 2017-02-19 MED ORDER — AMIODARONE HCL 200 MG PO TABS
200.0000 mg | ORAL_TABLET | Freq: Every day | ORAL | Status: DC
Start: 2017-02-19 — End: 2017-02-23
  Administered 2017-02-19 – 2017-02-22 (×4): 200 mg via GASTROSTOMY
  Filled 2017-02-19 (×4): qty 1

## 2017-02-19 MED ORDER — FAMOTIDINE 20 MG PO TABS
20.0000 mg | ORAL_TABLET | Freq: Every evening | ORAL | Status: DC
Start: 2017-02-19 — End: 2017-02-23
  Administered 2017-02-19 – 2017-02-21 (×4): 20 mg via GASTROSTOMY
  Filled 2017-02-19 (×4): qty 1

## 2017-02-19 MED ORDER — ATORVASTATIN CALCIUM 20 MG PO TABS
20.0000 mg | ORAL_TABLET | Freq: Every evening | ORAL | Status: DC
Start: 2017-02-19 — End: 2017-02-23
  Administered 2017-02-19 – 2017-02-21 (×4): 20 mg via GASTROSTOMY
  Filled 2017-02-19 (×4): qty 1

## 2017-02-19 MED ORDER — ALBUTEROL-IPRATROPIUM 2.5-0.5 (3) MG/3ML IN SOLN
3.0000 mL | RESPIRATORY_TRACT | Status: DC | PRN
Start: 2017-02-19 — End: 2017-02-23

## 2017-02-19 NOTE — Progress Notes (Signed)
Pulmonary PROGRESS NOTE                                                 Gean Quint MD, [place], CMD                                                             [phone]    Date Time: 02/19/17 10:40 AM  Patient Name: [name],[name]  Patient status: Inpatient  Patient hospital day: 0    Agents and examined by Dr. Graciela Husbands and Nicholes Calamity, NP      Assessment:   Chronic respiratory failure, patient has chronic tracheostomy to  Ventilator, secondary to prior neck fracture with quadriplegia   Leukocytosis.,  Improving  Pneumonia, Pseudomonas and  Serratia from most recent culture  Recent lactobacillus bacteremia  Dehydration    Other medical conditions  Altered mental status/agitation.  On admission, none currently observed  Atrial fibrillation  Sacral ulcer present on admission  Plan:   Vent support.  Trach care.  Suction when necessary  CT chest without contrast  Pro-calcitonin  Panculture  IV hydration  Antibiotics per ID consultant  abg in am  DVT prophylaxis, xarelto  GI  Prophylaxis, pepcid  Plan of care discussed with nurse at the bedside  Subjective:   No acute distress noted, patient encephalopathic with tracheostomy to ventilator    Medications:     Current Facility-Administered Medications   Medication Dose Route Frequency   . amiodarone  200 mg per G tube Daily   . amLODIPine  5 mg per G tube Daily   . atorvastatin  20 mg per G tube QHS   . famotidine  20 mg per G tube QHS   . metoclopramide  5 mg per G tube [facility]   . nystatin  400,000 Units Oral QID   . QUEtiapine  25 mg per G tube BID   . rivaroxaban  20 mg per G tube Daily with dinner   . scopolamine  1 patch Transdermal Q72H   . simethicone  80 mg per G tube [facility]   . vitamin C  500 mg per G tube BID       Review of Systems:   Not able to do review of systems at this time as patient only opening eyes to verbal stimuli  Physical Exam:   BP 96/60   Pulse 76   Temp 98 F (36.7 C) (Axillary)   Resp 16   Ht 1.854 m (6' 0.99")   Wt 94 kg (207 lb  3.7 oz)   SpO2 100%   BMI 27.35 kg/m         Intake/Output Summary (Last 24 hours) at 02/19/17 1040  Last data filed at 02/19/17 0100   Gross per 24 hour   Intake                0 ml   Output                0 ml   Net                0 ml  General appearance -Sleepy, opens eyes to verbal stimuli but was not able to participate in exam  Mental status - see above  Eyes - pupils equal and reactive  Nose - normal and patent,  Mouth - mucous membranes dry  Neck - tracheostomy to ventilator  Lymphatics - no  lymphadenopathy  Chest - clear to auscultation,   Heart - normal rate, irregular rhythm, normal S1, S2,   Abdomen - soft, , bowel sounds normal, G-tube intact  Neurological - opens eyes to verbal stimuli, patient with chronic quadriplegia  Extremities -no edema  Skin - normal coloration, fair turgor, sacral ulcer noted, present on admission    Labs:     CBC w/Diff CMP     Recent Labs  Lab 02/19/17  0528 02/18/17  2233 02/16/17  1600   WBC 15.13* 19.28* 14.45*   Hgb 12.8* 12.2* 11.9*   Hematocrit 39.3* 38.5* 37.2*   Platelets 285 374 262   MCV 83.3 82.6 83.4   Neutrophils 77.5 83.0  --        PT/INR           Recent Labs  Lab 02/19/17  0528 02/18/17  2233 02/16/17  1000   Sodium 136 137 136   Potassium 4.2 4.2 4.3   Chloride 98* 96* 96*   CO2 25 29 26    BUN 33.0* 35.0* 34.0*   Creatinine 0.8 0.8 0.7   Glucose 91 92 76   Calcium 9.7 10.3* 9.1   Protein, Total  --  7.2  --    Albumin  --  3.2*  --    AST (SGOT)  --  16  --    ALT  --  22  --    Alkaline Phosphatase  --  81  --    Bilirubin, Total  --  0.5  --       Glucose POCT     Recent Labs  Lab 02/19/17  0528 02/18/17  2233 02/16/17  1000   Glucose 91 92 76          Recent Labs  Lab 02/18/17  2233   Troponin I <0.01       Urinalysis    Recent Labs  Lab 02/18/17  2233   Urine Type Clean Catch   Color, UA Yellow   Clarity, UA Hazy   Specific Gravity UA 1.012   Urine pH 5.0   Nitrite, UA Negative   Ketones UA Negative   Urobilinogen, UA Negative   Bilirubin, UA  Negative   Blood, UA Negative   RBC, UA 0 - 2   WBC, UA 0 - 5         Rads:     Radiology Results (24 Hour)     Procedure Component Value Units Date/Time    CT Head WO Contrast [[id number]] Collected:  02/18/17 2214    Order Status:  Completed Updated:  02/18/17 2219    Narrative:       CLINICAL INDICATION:  Altered mental status    TECHNIQUE:  Axial noncontrast CT images were obtained from the skull  base to the vertex. A combination of a automatic exposure control,  adjustment of the mA and/or kV  according to patient size and/or use of  iterative reconstruction technique was utilized.      COMPARISON:  01/08/2017    INTERPRETATION: There is no acute intracranial hemorrhage or extra axial  fluid collection. There is periventricular low-attenuation, consistent  with chronic small vessel ischemic  disease. There is no midline shift or  mass effect. The gray-white matter differentiation is normal.    Visualized paranasal sinuses are clear      Impression:         No acute intracranial abnormality.    Wyatt Portela, MD   02/18/2017 10:15 PM    XR Chest  AP Portable [[id number]] Collected:  02/18/17 2102    Order Status:  Completed Updated:  02/18/17 2106    Narrative:       CLINICAL INDICATION: AMS     COMPARISON: 01/24/2017    INTERPRETATION:   A single frontal view of the chest was obtained. .     Tracheostomy cannula is in position. The cardiomediastinal contour  is  within normal limits for age. There are small bilateral pleural  effusions and bibasilar airspace disease. This appears not significantly  changed from 01/24/2017.          Impression:         Stable probable small bilateral pleural effusions and  bibasilar airspace disease.    Wyatt Portela, MD   02/18/2017 9:02 PM            Gean Quint, MD    02/19/2017  10:40 AM

## 2017-02-19 NOTE — H&P (Signed)
Puylmonary consult      Date Time: 02/18/17 11.00 PM  Patient Name: [name]  Attending Physician: Theresia Bough, MD  Cc AMS, agitation  Assessment:   Chronic Respiratory failure, secondary to neck fracture with resulting quadraplegia  Leukocytosis, worsening  Pneumonia, serrata/pseudomonas recently  lactobcacillus Bacteremia from 01/08/17 cultures  Atrial fibrillation  HTN  Azotemia    Plan:   Admit to IMCU, on chronic vent  Added ID to profile, Dr Janalyn Rouse. No fever, however WBC now 19.28 despite augmentin since discharge.  Left shift persists.   Will give meropenem, follow cultures  Afib, patient on xarelto, and now cardizem drip, will resume amiodarone in am. Hopefully will be able to titrate off cardizem,   reordered his norvasc, will hold off on diuretics for now  Monitor chemistry  Increase free water    History:   [name] is a 83 y.o. male who presents to the hospital on 02/18/2017  With altered mental status reported by the staff at [facility]. He was recently discharged on 01/30/17 after being treated for serrata/pseudomonas PNA and MDR proteus UTI, lactobacillus paracasei and fermentum bacteremia- sensitive to pcn. His EF was 45-50% and is on chronic anticoagulation for atrial fibrilllation. He was discharged on augmentin for two more weeks. He is a chronic vent/trach patient secondary to quadriparesis from neck fracture. He was noted to have thrombocytopenia while on zosyn.    Head CT without acute changes. Denies abdominal pain. No diarrhea. Just very HOH  Past Medical History:     Past Medical History:   Diagnosis Date   . C2 cervical fracture    . Chronic pain    . Conjunctivitis    . Constipation    . Cutaneous abscess of buttock    . Dysphagia    . Hypertension    . Insomnia    . Kidney disorder    . Respiratory arrest      DVT jan 2018 RUE  GIB: melana  Current/Home Medications    ACETAMINOPHEN (TYLENOL) 160 MG/5ML SUSPENSION    Give 20.3 mL via gastric tube every 6 hours as  needed    ALBUTEROL-IPRATROPIUM (DUO-NEB) 2.5-0.5(3) MG/3 ML NEBULIZER    Take 3 mLs by nebulization 4 (four) times daily.In addition to every 4 hours as needed    AMIODARONE (PACERONE) 200 MG TABLET    Place 1 tablet (200 mg total) into feeding tube daily.    AMLODIPINE (NORVASC) 5 MG TABLET    TAKE ONE TABLET BY gastric tube EVERY DAY    ASCORBIC ACID (VITAMIN C) 500 MG TABLET    500 mg by per G tube route 2 (two) times daily.        ATORVASTATIN (LIPITOR) 20 MG TABLET    20 mg by per G tube route nightly.        BUMETANIDE (BUMEX) 0.5 MG TABLET    0.5 mg by per G tube route daily.    CEFTRIAXONE (ROCEPHIN) 1 G INJECTION    Inject 1 g into the muscle daily.    FAMOTIDINE (PEPCID) 20 MG TABLET    20 mg by per G tube route nightly.        FUROSEMIDE (LASIX) 40 MG TABLET    40 mg by per G tube route daily.    GLYCOPYRROLATE (ROBINUL) 1 MG TABLET    1 mg by per G tube route daily.    METOCLOPRAMIDE (REGLAN) 5 MG TABLET    5 mg by per Reece Agar  tube route every 8 (eight) hours.    METOPROLOL TARTRATE (LOPRESSOR) 25 MG TABLET    25 mg by per G tube route 2 (two) times daily.    MULTIPLE VITAMIN (TAB-A-VITE) TAB    1 tablet daily.Via g tube    POTASSIUM CHLORIDE 20 MEQ/15ML (10%) ORAL SOLUTION    10 mEq by per G tube route daily.    QUETIAPINE (SEROQUEL) 25 MG TABLET    25 mg by per G tube route 2 (two) times daily.    RIVAROXABAN (XARELTO) 20 MG TAB    20 mg by per G tube route daily with dinner.    SCOPOLAMINE (TRANSDERM-SCOP) 1.5 MG    Place 1 patch onto the skin every third day.    SENNA (SENOKOT) 8.6 MG TABLET    2 tablets by per G tube route daily as needed for Constipation.    SIMETHICONE (MYLICON) 80 MG CHEWABLE TABLET    1 tablet (80 mg total) by per G tube route every 8 (eight) hours.       Past Surgical History:     Past Surgical History:   Procedure Laterality Date   . GASTROSTOMY TUBE PLACEMENT     . TRACHEOSTOMY         Family History:   No family history on file.    Social History:     Social History     Social  History   . Marital status: Single     Spouse name: N/A   . Number of children: N/A   . Years of education: N/A     Social History Main Topics   . Smoking status: Never Smoker   . Smokeless tobacco: Never Used   . Alcohol use No   . Drug use: No   . Sexual activity: Not on file     Other Topics Concern   . Not on file     Social History Narrative   . No narrative on file       Allergies:     Allergies   Allergen Reactions   . Dilaudid [Hydromorphone Hcl]        Medications:     Current Facility-Administered Medications   Medication Dose Route Frequency   . ertapenem  1 g Intravenous Once       Review of Systems:   A comprehensive review of systems was: History obtained from the patient  General ROS: negative  however he feels he has never gotten "well" from last visit  ENT ROS: positive for - cough, but no sore throat or headaches. HOH   Respiratory ROS: positive for - cough and shortness of breath  Cardiovascular ROS: negative for - chest pain  Gastrointestinal ROS: negative for - abdominal pain, appetite loss, blood in stools, diarrhea, nausea/vomiting or gets nutrition from peg  Genito-Urinary ROS: negative for - dysuria  Musculoskeletal ROS: quadriplegic  Neurological ROS: negative for - dizziness, headaches or report from MD: AMS at [facility]  Dermatological ROS: negative    Physical Exam:     VITAL SIGNS   Temp:  [98.7 F (37.1 C)-99.5 F (37.5 C)] 99.5 F (37.5 C)  Heart Rate:  [92-135] 118  Resp Rate:  [16-27] 24  BP: (112-146)/(56-71) 146/69  FiO2:  [40 %] 40 %  Blood Glucose:  Pulse ox:  Telemetry:    No intake or output data in the 24 hours ending 02/19/17 0006   Capillary refill = < 2 seconds    Physical Exam  Constitutional: He appears well-developed.   HENT:   Head: Normocephalic.   Eyes: Pupils are equal, round, and reactive to light.   Neck: Neck supple. No JVD present.   Cardiovascular: Normal heart sounds and intact distal pulses.  An irregularly irregular rhythm present.   Pulmonary/Chest: He  has rhonchi in the right upper field and the left upper field.   Trach in place, bilateral breathsounds, frequent high pressure on vent alarm. Coarse bs. No wheezing   Abdominal: Soft. Bowel sounds are normal.   Musculoskeletal: He exhibits edema.   Neurological: He is alert.   Skin: Capillary refill takes less than 2 seconds. There is pallor.   Psychiatric: He has a normal mood and affect.       Data:     Vent Settings:    IBW: Vent Settings  Vent Mode: ACMV  FiO2: 40 %  Resp Rate (Set): 16  Vt (Set, mL): 500 mL  PIP Observed (cm H2O): 30 cm H2O  PEEP/EPAP: 5 cm H20  Mean Airway Pressure: 10 cmH20    Patient Lines/Drains/Airways Status    Active PICC Line / CVC Line / PIV Line / Drain / Airway / Intraosseous Line / Epidural Line / ART Line / Line / Wound / Pressure Ulcer / NG/OG Tube     Name:   Placement date:   Placement time:   Site:   Days:    Peripheral IV 02/18/17 Right Antecubital  02/18/17    2243    Antecubital    less than 1    Gastrostomy/Enterostomy Gastrostomy 20 Fr.   01/09/17    1715        40    Surgical Airway Shiley 6 mm Cuffed;Long  12/14/16    1855    6 mm    66                Labs:       Labs (last 72 hours):    Recent Labs  Lab 02/18/17  2233 02/16/17  1600   WBC 19.28* 14.45*   Hgb 12.2* 11.9*   Hematocrit 38.5* 37.2*   Platelets 374 262     No results for input(s): PT, INR, PTT in the last 72 hours.      Recent Labs      02/18/17   2233  02/16/17   1000   Sodium  137  136   Potassium  4.2  4.3   Chloride  96*  96*   CO2  29  26   BUN  35.0*  34.0*   Creatinine  0.8  0.7   Glucose  92  76   Calcium  10.3*  9.1       Recent Labs  Lab 02/18/17  2233   Troponin I <0.01     .              Rads:   Radiological Procedure reviewed.     I have personally reviewed the patient's history and 24 hour interval events, along with vitals, labs, radiology images.    So far today I have spent 30 minutes providing care for this patient excluding teaching and billable procedures, and not overlapping with any  other providers.:    Signed by:   Gean Quint, MD    Date/Time: 02/18/17 11.00 PM

## 2017-02-19 NOTE — H&P (Signed)
ADMISSION HISTORY AND PHYSICAL EXAM    Date Time: 02/19/17 2:22 PM  Patient Name: [name]  Attending Physician: Marlene Bast*    Assessment:      Acute on Chronic Respiratory Failure   Leukocytosis   Pneumonia   Chronic Atrial Fibrillation   Hypertension   Cardiomyopathy with an EF of 40%    Plan:      Admit to IMCU   Continue with vent support    Start tube feeding    Got antibiotics last night but hold on until culture results are back   Was on Cardizem drip for AFIB with RVR, can stop and continue amiodarone for rate control   Continue anticoagulation with Xarelto   Pulmonary consult for vent management   ID consult for leukocytosis and possible pneumonia.    Chief Complaint:     Altered mental status and lethargy of one day     History of Present Illness:     [name] is a 83 y.o. male who was recently discharged on 01/30/17 after presenting with   Metabolic encephalopathy, pneumonia and UTI who presented to the Emergency Room with altered   Mental status that was noticed by nursing staff. 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 He is lethargic and could not give any history. He had workup here in the ER  And found to have leukocytosis. He was also found to be in atrial fibrillation with rapid ventricular   Response. He was started on Cardizem and his rate is better this morning. He will be admitted for   Further workup of altered mental status     Past Medical History:     Past Medical History:   Diagnosis Date   . C2 cervical fracture    . Chronic pain    . Conjunctivitis    . Constipation    . Cutaneous abscess of buttock    . Dysphagia    . Hypertension    . Insomnia    . Kidney disorder    . Respiratory arrest        Past Surgical History:     Past Surgical History:   Procedure Laterality Date   . GASTROSTOMY TUBE PLACEMENT     . TRACHEOSTOMY         Family History:     No family history on file.    Social History:     Social  History     Social History   . Marital status: Single     Spouse name: N/A   . Number of children: N/A   . Years of education: N/A     Social History Main Topics   . Smoking status: Never Smoker   . Smokeless tobacco: Never Used   . Alcohol use No   . Drug use: No   . Sexual activity: Not on file     Other Topics Concern   . Not on file     Social History Narrative   . No narrative on file       Allergies:     Allergies   Allergen Reactions   . Dilaudid [Hydromorphone Hcl]      Medications:     Prescriptions Prior to Admission   Medication Sig   . acetaminophen (TYLENOL) 160 MG/5ML suspension Give 20.3 mL via gastric tube every 6 hours as needed   . albuterol-ipratropium (DUO-NEB) 2.5-0.5(3) mg/3 mL nebulizer Take 3 mLs by  nebulization 4 (four) times daily.In addition to every 4 hours as needed   . amiodarone (PACERONE) 200 MG tablet Place 1 tablet (200 mg total) into feeding tube daily.   Marland Kitchen amLODIPine (NORVASC) 5 MG tablet TAKE ONE TABLET BY gastric tube EVERY DAY   . Ascorbic Acid (VITAMIN C) 500 MG tablet 500 mg by per G tube route 2 (two) times daily.       Marland Kitchen atorvastatin (LIPITOR) 20 MG tablet 20 mg by per G tube route nightly.       . bumetanide (BUMEX) 0.5 MG tablet 0.5 mg by per G tube route daily.   . cefTRIAXone (ROCEPHIN) 1 g injection Inject 1 g into the muscle daily.   . famotidine (PEPCID) 20 MG tablet 20 mg by per G tube route nightly.       . furosemide (LASIX) 40 MG tablet 40 mg by per G tube route daily.   Marland Kitchen glycopyrrolate (ROBINUL) 1 MG tablet 1 mg by per G tube route daily.   . metoclopramide (REGLAN) 5 MG tablet 5 mg by per G tube route every 8 (eight) hours.   . metoprolol tartrate (LOPRESSOR) 25 MG tablet 25 mg by per G tube route 2 (two) times daily.   . Multiple Vitamin (TAB-A-VITE) Tab 1 tablet daily.Via g tube   . potassium chloride 20 MEQ/15ML (10%) oral solution 10 mEq by per G tube route daily.   . QUEtiapine (SEROQUEL) 25 MG tablet 25 mg by per G tube route 2 (two) times daily.   .  rivaroxaban (XARELTO) 20 MG Tab 20 mg by per G tube route daily with dinner.   Marland Kitchen scopolamine (TRANSDERM-SCOP) 1.5 mg Place 1 patch onto the skin every third day.   . senna (SENOKOT) 8.6 MG tablet 2 tablets by per G tube route daily as needed for Constipation.   . simethicone (MYLICON) 80 MG chewable tablet 1 tablet (80 mg total) by per G tube route every 8 (eight) hours.       Review of Systems:     Could not be obtained as he is letahrgic and is on tracheostomy.    Physical Exam:     Vitals:    02/19/17 1145   BP:    Pulse:    Resp:    Temp: 97.9 F (36.6 C)   SpO2:        Intake and Output Summary (Last 24 hours) at Date Time    Intake/Output Summary (Last 24 hours) at 02/19/17 1422  Last data filed at 02/19/17 0100   Gross per 24 hour   Intake                0 ml   Output                0 ml   Net                0 ml       General appearance - lethargic and sleepy  HEENT: pupils equal and reactive, anicteric sclera                  mucous membranes dry, no lesion on tongue  Neck : tracheostomy in situ and on vent  Chest : Clear to auscultation  Heart :  normal S1, S 2 well heard, irregularly irregualar  Abdomen : soft, non tender, no organomegaly, active bowel sounds  Neurological : arousable but sleepy  Extremities : peripheral pulses are normal, trace pedal  edema      Laboratory Results:     Results     Procedure Component Value Units Date/Time    Glucose Whole Blood - POCT [[id number]]  (Abnormal) Collected:  02/19/17 1146     Updated:  02/19/17 1154     POCT - Glucose Whole blood 108 (H) mg/dL     Basic Metabolic Panel [[id number]]  (Abnormal) Collected:  02/19/17 0528    Specimen:  Blood Updated:  02/19/17 0621     Glucose 91 mg/dL      BUN 62.9 (H) mg/dL      Creatinine 0.8 mg/dL      Calcium 9.7 mg/dL      Sodium 528 mEq/L      Potassium 4.2 mEq/L      Chloride 98 (L) mEq/L      CO2 25 mEq/L      Anion Gap 13.0    Hemolysis index [[id number]]  (Abnormal) Collected:  02/19/17 0528     Updated:  02/19/17 0621      Hemolysis Index 30 (H)    GFR [[id number]] Collected:  02/19/17 0528     Updated:  02/19/17 0621     EGFR >60.0    CBC and differential [[id number]]  (Abnormal) Collected:  02/19/17 0528    Specimen:  Blood from Blood Updated:  02/19/17 0552     WBC 15.13 (H) x10 3/uL      Hgb 12.8 (L) g/dL      Hematocrit 63.8 (L) %      Platelets 285 x10 3/uL      RBC 4.72 x10 6/uL      MCV 83.3 fL      MCH 27.1 (L) pg      MCHC 32.6 g/dL      RDW 17 (H) %      MPV 9.6 fL      Neutrophils 77.5 %      Lymphocytes Automated 10.5 %      Monocytes 8.9 %      Eosinophils Automated 2.0 %      Basophils Automated 0.4 %      Immature Granulocyte 0.7 %      Nucleated RBC 0.0 /100 WBC      Neutrophils Absolute 11.73 (H) x10 3/uL      Abs Lymph Automated 1.59 x10 3/uL      Abs Mono Automated 1.34 (H) x10 3/uL      Abs Eos Automated 0.31 x10 3/uL      Absolute Baso Automated 0.06 x10 3/uL      Absolute Immature Granulocyte 0.10 (H) x10 3/uL      Absolute NRBC 0.00 x10 3/uL     MRSA Culture [[id number]] Collected:  02/18/17 2233    Specimen:  Body Fluid from Nares and Throat Updated:  02/19/17 0156    Blood Culture Aerobic/Anaerobic #1 [[id number]] Collected:  02/18/17 2233    Specimen:  Arm from Blood, Venipuncture Updated:  02/19/17 0144    Narrative:       1 BLUE+1 PURPLE    Blood Culture Aerobic/Anaerobic #2 [[id number]] Collected:  02/18/17 2233    Specimen:  Arm from Blood, Venipuncture Updated:  02/19/17 0144    Narrative:       1 BLUE+1 PURPLE    B-type Natriuretic Peptide [[id number]]  (Abnormal) Collected:  02/18/17 2233    Specimen:  Blood Updated:  02/19/17 0117     B-Natriuretic Peptide 148 (H) pg/mL     Rapid influenza A/B antigens (  Flu) [[id number]] Collected:  02/19/17 0037    Specimen:  Nasopharyngeal from Nasal Aspirate Updated:  02/19/17 0108    Narrative:       ORDER#: [id number]                                    ORDERED BY: Meryl Crutch, LEON  SOURCE: Nasal Aspirate                               COLLECTED:  02/19/17  00:37  ANTIBIOTICS AT COLL.:                                RECEIVED :  02/19/17 00:43  Influenza Rapid Antigen A&B                FINAL       02/19/17 01:05  02/19/17   Negative for Influenza A and B             Reference Range: Negative      Troponin I [[id number]] Collected:  02/18/17 2233    Specimen:  Blood Updated:  02/18/17 2329     Troponin I <0.01 ng/mL     Comprehensive metabolic panel [[id number]]  (Abnormal) Collected:  02/18/17 2233    Specimen:  Blood Updated:  02/18/17 2322     Glucose 92 mg/dL      BUN 03.4 (H) mg/dL      Creatinine 0.8 mg/dL      Sodium 742 mEq/L      Potassium 4.2 mEq/L      Chloride 96 (L) mEq/L      CO2 29 mEq/L      Calcium 10.3 (H) mg/dL      Protein, Total 7.2 g/dL      Albumin 3.2 (L) g/dL      AST (SGOT) 16 U/L      ALT 22 U/L      Alkaline Phosphatase 81 U/L      Bilirubin, Total 0.5 mg/dL      Globulin 4.0 (H) g/dL      Albumin/Globulin Ratio 0.8 (L)     Anion Gap 12.0    Hemolysis index [[id number]] Collected:  02/18/17 2233     Updated:  02/18/17 2322     Hemolysis Index 0    GFR [[id number]] Collected:  02/18/17 2233     Updated:  02/18/17 2322     EGFR >60.0    UA, Reflex to Microscopic (pts 3 + yrs) [[id number]] Collected:  02/18/17 2233    Specimen:  Urine Updated:  02/18/17 2315     Urine Type Clean Catch     Color, UA Yellow     Clarity, UA Hazy     Specific Gravity UA 1.012     Urine pH 5.0     Leukocyte Esterase, UA Negative     Nitrite, UA Negative     Protein, UR Negative     Glucose, UA Negative     Ketones UA Negative     Urobilinogen, UA Negative mg/dL      Bilirubin, UA Negative     Blood, UA Negative     RBC, UA 0 - 2 /hpf      WBC, UA 0 - 5 /hpf     CBC with differential [[id number]]  (  Abnormal) Collected:  02/18/17 2233    Specimen:  Blood from Blood Updated:  02/18/17 2312     WBC 19.28 (H) x10 3/uL      Hgb 12.2 (L) g/dL      Hematocrit 54.0 (L) %      Platelets 374 x10 3/uL      RBC 4.66 (L) x10 6/uL      MCV 82.6 fL      MCH 26.2 (L) pg      MCHC 31.7 (L)  g/dL      RDW 17 (H) %      MPV 9.8 fL      Neutrophils 83.0 %      Lymphocytes Automated 6.7 %      Monocytes 7.5 %      Eosinophils Automated 1.7 %      Basophils Automated 0.4 %      Immature Granulocyte 0.7 %      Nucleated RBC 0.0 /100 WBC      Neutrophils Absolute 16.01 (H) x10 3/uL      Abs Lymph Automated 1.29 x10 3/uL      Abs Mono Automated 1.44 (H) x10 3/uL      Abs Eos Automated 0.32 x10 3/uL      Absolute Baso Automated 0.08 x10 3/uL      Absolute Immature Granulocyte 0.14 (H) x10 3/uL      Absolute NRBC 0.00 x10 3/uL     Lactic acid [[id number]] Collected:  02/18/17 2230    Specimen:  Blood Updated:  02/18/17 2241     Lactic acid 1.0 mmol/L             Radiology Reports:   Radiological Procedure reviewed.  Radiology Results (24 Hour)     Procedure Component Value Units Date/Time    CT Head WO Contrast [[id number]] Collected:  02/18/17 2214    Order Status:  Completed Updated:  02/18/17 2219    Narrative:       CLINICAL INDICATION:  Altered mental status    TECHNIQUE:  Axial noncontrast CT images were obtained from the skull  base to the vertex. A combination of a automatic exposure control,  adjustment of the mA and/or kV  according to patient size and/or use of  iterative reconstruction technique was utilized.      COMPARISON:  01/08/2017    INTERPRETATION: There is no acute intracranial hemorrhage or extra axial  fluid collection. There is periventricular low-attenuation, consistent  with chronic small vessel ischemic disease. There is no midline shift or  mass effect. The gray-white matter differentiation is normal.    Visualized paranasal sinuses are clear      Impression:         No acute intracranial abnormality.    Wyatt Portela, MD   02/18/2017 10:15 PM    XR Chest  AP Portable [[id number]] Collected:  02/18/17 2102    Order Status:  Completed Updated:  02/18/17 2106    Narrative:       CLINICAL INDICATION: AMS     COMPARISON: 01/24/2017    INTERPRETATION:   A single frontal view of the chest was  obtained. .     Tracheostomy cannula is in position. The cardiomediastinal contour  is  within normal limits for age. There are small bilateral pleural  effusions and bibasilar airspace disease. This appears not significantly  changed from 01/24/2017.          Impression:  Stable probable small bilateral pleural effusions and  bibasilar airspace disease.    Wyatt Portela, MD   02/18/2017 9:02 PM            Signed by: Ledora Bottcher, M.D.

## 2017-02-19 NOTE — Consults (Addendum)
WOC Nurse Wound Consult:     [name] is a 83 y.o. male admitted with altered mental status .      LOS:  LOS: 0 days   POD: * No surgery found *    PMH:   Past Medical History:   Diagnosis Date   . C2 cervical fracture    . Chronic pain    . Conjunctivitis    . Constipation    . Cutaneous abscess of buttock    . Dysphagia    . Hypertension    . Insomnia    . Kidney disorder    . Respiratory arrest      PSH:   Past Surgical History:   Procedure Laterality Date   . GASTROSTOMY TUBE PLACEMENT     . TRACHEOSTOMY         Braden Score: Braden Scale Score: 13 (02/19/17 1038)    Specialty Bed:    Progressa Pulmonary Bed    Patient is on ventilator and non verbal.  Noted he is incontinent of urine and bowel.  Patient has pressure redistribution boots and is being TARP q 2 hours.      Wound Assessment:     To the left of the Gluteal Cleft -Moisture Associated Skin Damage( MASD)and friction and shear. 2 areas of partial thickness skin loss noted between the gluteal cleft. Skin is rolled up on the skin surface from shear/friction.  Area is open and 100% pink.  Does not appear to be a pressure related injury as the wound is unilateral and located to the left of the gluteal cleft.  Peri wound is erythematous and blanchable.  However patient was getting incontinence care and skin was exposed to urine.       Superior wound measures 2 cm x 1 cm x 0.1 cm and .  Inferior Wound measures 2 cm x 2 cm x 0.2 cm    Recommendations/Plan:   1. Initiate/ continue pressure prevention bundle:   Head of the bed 30 degrees or less   Positioning device to the bedside   Eliminate/minimize pressure from area   Float heels with boots or pillows   Turn patient   Pressure redistribution cushion to chair   Use lift sheets/low friction surface sheets for positioning   Pad bony prominences   Nutrition consult/Optimize nutrition   Initiate bed algorith/Specialty bed    2. Moisture/Incontinence management - Cleanse with incontinence  cleansing wipe/water to manage incontinence and to protect skin from exposure to urine and stool. Apply Venelex to wound.  Top with Z Guard skin barrier protection cream to site. Marland Kitchen Apply [place]/external or foley catheter to prevent urinary contamination of wound. Apply rectal pouch/fecal management system   per unit policy, to prevent fecal contamination of wound or to contain diarrhea.     3. Recommendation changed to include Venelex ointment topically to skin.  Venelex is used to stimulate effective capillary bed action and increase circulation to wound along with providing moist wound healing and improve epithelialization. Apply Venelex to  Area.  Top with Z- Guard Skin barrier and cover with sacral adhesive foam dressing to sacral coccygeal area by molding the foam into the buttock cleft, making sure to not "pinch'" the buttocks together    4.Wound care orders entered into EMR. Care rendered. Please re-consult WOC Nurse team if wound deteriorates or further assistance required.         Jaicion Laurie "Pepper" Brynlyn Dade  MSN, RN, CWOCN  [facility]  Wound, Ostomy, and  Continence Coordinator  [phone] Office  [phone] [employer]

## 2017-02-19 NOTE — Consults (Addendum)
Infectious Diseases and Tropical Medicine Consult  Tenna Child, MD          Date Time: 02/19/17 10:55 AM  Patient Name: [name],[name]  Referring Physician: Marlene Bast*      Reason for Consultation:      Leukocytosis    Assessment:      Chronic respiratory failure.   Atrial fibrillation with rapid ventricular response.   No clear evidence of an infection   Recently treated pneumonia, urinary tract infection, lactobacillus bacteremia.   Quadriplegia    Recommendations:      Monitor without antibiotics   Await pending cultures and if patient has persistent lactobacillus bacteremia, will need TEE   Pro-calcitonin level.   Follow-up chest x-ray   Probiotics    Monitor electrolytes and renal functions closely   Monitor clinically                                                                   History of Present Illness:     Mr. Dentinger is an 83 year old male resident of [name] with a history of hypertension, cervical fracture causing quadriplegia requiring tracheostomy and G-tube placement and is admitted because of worsening shortness of breath and altered mental status.  Patient was recently discharged after prolonged hospitalization for pneumonia, urinary tract infection and lactobacillus bacteremia.  Patient received prolonged course of IV antibiotics followed by Augmentin and was discharged back to [facility] nursing home on 01/30/2017.  There is no fever.  Patient had leukocytosis which is improving, and chest x-ray showing stable bibasilar airspace disease.  Patient has received 1 dose of ertapenem.  Blood cultures are ordered and are pending.  Patient is found to be in atrial fibrillation and started on Cardizem drip.  No vomiting or diarrhea.  No seizure or syncopal episode     Past Medical History:      Hypertension.   Chronic atrial fibrillation   C2 cervical fracture.   Insomnia.    Past Surgical History:      Tracheostomy.   G-tube  placement    Family History:      Noncontributory    Social History:      No smoking    No alcohol    Allergies:      Dilaudid    Review of Systems:     General:  Comfortable, no acute distress, no fever.  HEENT: Tracheostomy tube in place  Respiratory: no cough or shortness of breath, no hematemesis or hemoptysis  Cardiac: no chest pain   Abdomen: no abdominal pain, no nausea vomiting or diarrhea, G-tube in place  Neurologic: awake and alert  Extremities: no joint pains or swelling.  Genitourinary: No dysuria or hematuria  Dermatologic: no skin rashes, no itching    Physical Exam:     Blood pressure 96/60, pulse 76, temperature 98 F (36.7 C), temperature source Axillary, resp. rate 16, height 1.854 m (6' 0.99"), weight 94 kg (207 lb 3.7 oz), SpO2 100 %.    General Appearance: Comfortable, well-appearing and in no acute distress.    HEENT:  Head is normocephalic, atraumatic, pupils are equal and reactive to light  Neck:    Supple, tracheostomy tube in place   Lungs:  Decreased breath sounds  Chest Wall: Symmetric chest wall expansion.   Heart : Regular rate and rhythm, no murmur or gallop  Abdomen: Abdomen is obese, nontender, good bowel sounds, no hepatosplenomegaly, G-tube in place  Neurological: Awake and alert, quadriplegia  Extremities: No edema    Labs:     Recent Labs      02/19/17   0528  02/18/17   2233   WBC  15.13*  19.28*   Hgb  12.8*  12.2*   Hematocrit  39.3*  38.5*   Platelets  285  374   MCV  83.3  82.6       Recent Labs      02/19/17   0528  02/18/17   2233   Sodium  136  137   Potassium  4.2  4.2   Chloride  98*  96*   CO2  25  29   BUN  33.0*  35.0*   Creatinine  0.8  0.8   Glucose  91  92   Calcium  9.7  10.3*       Recent Labs      02/18/17   2233   AST (SGOT)  16   ALT  22   Alkaline Phosphatase  81   Protein, Total  7.2   Albumin  3.2*   Bilirubin, Total  0.5         Imaging studies:           Thanks for the consultation          Signed by: Tenna Child, MD  Date Time: 02/19/17  10:55 AM      *This note was generated by the Epic EMR system/ Dragon speech recognition and may contain inherent errors or omissions not intended by the user. Grammatical errors, random word insertions, deletions, pronoun errors and incomplete sentences are occasional consequences of this technology due to software limitations. Not all errors are caught or corrected. If there are questions or concerns about the content of this note or information contained within the body of this dictation they should be addressed directly with the author for clarification

## 2017-02-19 NOTE — Plan of Care (Signed)
Problem: Safety  Goal: Patient will be free from injury during hospitalization   02/19/17 1226   Goal/Interventions addressed this shift   Patient will be free from injury during hospitalization  Assess patient's risk for falls and implement fall prevention plan of care per policy;Provide and maintain safe environment;Ensure appropriate safety devices are available at the bedside;Include patient/ family/ care giver in decisions related to safety;Provide alternative method of communication if needed (communication boards, writing);Hourly rounding;Use appropriate transfer methods       Problem: Pain  Goal: Pain at adequate level as identified by patient   02/19/17 1226   Goal/Interventions addressed this shift   Pain at adequate level as identified by patient Identify patient comfort function goal;Assess pain on admission, during daily assessment and/or before any "as needed" intervention(s);Consult/collaborate with Pain Service;Include patient/patient care companion in decisions related to pain management as needed

## 2017-02-19 NOTE — UM Notes (Signed)
INPT per order early this am 02/19/17  Med a b  02/19/17 0013  Admit to Inpatient (Adult Inpatient Admit Panel (AX)) Once    Status:    Question Answer Comment   Admitting Physician AL Carilyn Goodpasture, [name]    Diagnosis Altered mental status    Estimated Length of Stay > or = to 2 midnights    Tentative Discharge Plan? Home or Self Care    Patient Class Inpatient    Bed request comments 3                83 yr male to Ed via EMS, fr [facility], c/c altered mental status. Per staff there, pt was agitated "trying to pull off his vent". En Route Vitals BP 110/80, Dexi- 121, HR 99  Blood pressure 122/56, pulse 100, temperature 98.7 F (37.1 C), temperature source Oral, resp. rate 16, SpO2 99 %.  . He was recently discharged on 01/30/17 after being treated for serrata/pseudomonas PNA and MDR proteus UTI, lactobacillus paracasei and fermentum bacteremia- sensitive to pcn. His EF was 45-50% and is on chronic anticoagulation for atrial fibrilllation. He was discharged on augmentin for two more weeks.  Provider Assessment: 83 y.o. male with AMS, seems to be getting worse over the past few days.  Noted recent urine culture with growth of proteus, prior was very resistant to many abx excepting ertapenem, Augmentin and Zosyn. Has been on Augmentin but WBC has been increasing - hospitalize and switch to ertapenem. Consulted pulmonary for vent management - CXR has been improving and on difficulty with oxygenation or change in settings. Placed on dilt gtt for slight afib RVR.  Additional History: [name] is a 83 y.o. male presenting to the ED with AMS. The patient was in his usual state of health until a few days ago. Daughter notes he has been more confused and nursing at [facility] was concerned that he was trying to pull out his trach. Patient denies pain (able to communicate a bit with cuff deflated). Patient very hard of hearing.  Past Medical History:   Diagnosis Date   . C2 cervical fracture    . Chronic  pain    . Conjunctivitis    . Constipation    . Cutaneous abscess of buttock    . Dysphagia    . Hypertension    . Insomnia    . Kidney disorder    . Respiratory arrest      BP 146/69   Pulse (!) 118   Temp 99.5 F (37.5 C) (Rectal)   Resp (!) 24   SpO2 100%   Trach in place.  Chest - Normal work of breathing, lungs clear to auscultation bilaterally  Neurological - Alert, moves all extremities, gestures wildly with hands, appears to be talking  Comprehensive metabolic panel [[id number]]  (Abnormal) Collected:  02/18/17 2233     Specimen:  Blood Updated:  02/18/17 2322     Glucose 92 mg/dL      BUN 40.9 (H) mg/dL      Creatinine 0.8 mg/dL      Sodium 811 mEq/L      Potassium 4.2 mEq/L      Chloride 96 (L) mEq/L      CO2 29 mEq/L      Calcium 10.3 (H) mg/dL      Protein, Total 7.2 g/dL      Albumin 3.2 (L) g/dL      AST (SGOT) 16 U/L  ALT 22 U/L      Alkaline Phosphatase 81 U/L      Bilirubin, Total 0.5 mg/dL      Globulin 4.0 (H) g/dL      Albumin/Globulin Ratio 0.8 (L)     Anion Gap 12.0     CBC with differential [[id number]]  (Abnormal) Collected:  02/18/17 2233     Specimen:  Blood from Blood Updated:  02/18/17 2312     WBC 19.28 (H) x10 3/uL      Hgb 12.2 (L) g/dL      Hematocrit 47.8 (L) %      Platelets 374 x10 3/uL      RBC 4.66 (L) x10 6/uL      MCV 82.6 fL      MCH 26.2 (L) pg      MCHC 31.7 (L) g/dL      RDW 17 (H)         UA, Reflex to Microscopic (pts 3 + yrs) [[id number]] Collected:  02/18/17 2233     Specimen:  Urine Updated:  02/18/17 2315     Urine Type Clean Catch     Color, UA Yellow     Clarity, UA Hazy     Specific Gravity UA 1.012     Urine pH 5.0     Leukocyte Esterase, UA Negative     Nitrite, UA Negative     Protein, UR Negative     Glucose, UA Negative     Ketones UA Negative     Urobilinogen, UA Negative mg/dL      Bilirubin, UA Negative     Blood, UA Negative     RBC, UA  0 - 2 /hpf      WBC, UA 0 - 5 /hpf      UA, Reflex to Microscopic (pts 3 + yrs) [[id number]] Collected:  02/18/17 2233     Specimen:  Urine Updated:  02/18/17 2315     Urine Type Clean Catch     Color, UA Yellow     Clarity, UA Hazy     Specific Gravity UA 1.012     Urine pH 5.0     Leukocyte Esterase, UA Negative     Nitrite, UA Negative     Protein, UR Negative     Glucose, UA Negative     Ketones UA Negative     Urobilinogen, UA Negative mg/dL      Bilirubin, UA Negative     Blood, UA Negative     RBC, UA 0 - 2 /hpf      WBC, UA 0 - 5 /hpf      Ct head neg  Chest:   Impression:          Stable probable small bilateral pleural effusions and  bibasilar airspace disease.       Patient Vitals for the past 12 hrs:   BP Temp Pulse Resp   02/19/17 0000 146/69 - (!) 118 (!) 24   02/18/17 2321 116/65 - (!) 105 18   02/18/17 2300 112/60 99.5 F (37.5 C) 92 17   02/18/17 2102 135/62 - (!) 124 (!) 27   02/18/17 2000 115/71 - (!) 135 -   02/18/17 1933 122/56 98.7 F (37.1 C) 100 16       Pulse Oximetry Analysis - Normal 99% on vent.      EKG:  Interpreted by the EP.              Time  Interpreted: 2004              Rate: 107              Rhythm: Atrial Fibrillation with RVR              QTc: 499              Interpretation: No repolarization abnormalities              Comparison: 01/08/2017 similar    Admit inpt  Altered mental status  CVICU  Trach/vent support.  Monitoring per protocol.    Assessment:   Chronic Respiratory failure, secondary to neck fracture with resulting quadraplegia  Leukocytosis, worsening  Pneumonia, serrata/pseudomonas recently  lactobcacillus Bacteremia from 01/08/17 cultures  Atrial fibrillation  HTN  Azotemia    Plan:   Admit to IMCU, on chronic vent  Added ID to profile, Dr Janalyn Rouse. No fever, however WBC now 19.28 despite augmentin since discharge.  Left shift persists.   Will give meropenem, follow cultures  Afib, patient on xarelto, and now cardizem  drip, will resume amiodarone in am. Hopefully will be able to titrate off cardizem,   reordered his norvasc, will hold off on diuretics for now  Monitor chemistry  Increase free water    Tube feeing per nutritionist recommendations  IVF  Nebs q 4  ID consult*    *  Recommendations:      Monitor without antibiotics   Await pending cultures and if patient has persistent lactobacillus bacteremia, will need TEE   Pro-calcitonin level.   Follow-up chest x-ray   Probiotics    Monitor electrolytes and renal functions closely   Monitor clinically        dcp pending ( return [name] once stable.)    Pershing Proud MSN RN ACM [phone]  Utilization Review Nurse, Case Management  [facility]  [phone]  (hospital staff only)         Please submit all clinical review requests via fax to [phone].

## 2017-02-19 NOTE — Progress Notes (Addendum)
Nutritional Support Services  Nutrition Assessment    [name] 82 y.o. male   MRN: [id number]    Summary of Nutrition Recommendations:   1. Change TF regimen to Jevity 1.5 via PEG @ 20 mls/hr and advance by 20 mls Q 4 hrs to goal rate of 60 mls/hr.    Provides 2160 total kcals, 92 gm protein, and 1094 mls free water in 1440 total mls.      2. Provide Prosource TID (3 packets daily)   Each Prosource provides 15 gm protein.      EN (at goal rate) + Prosource provides 2340 kcals, 137 gm protein, and 1094 mls free water in 1440 total mls.   Meets 100% estimated kcal and protein needs.    3. Flush tube with minimum of 30-50 ml Q 4 hours for tube patency- additional fluids for hydration per MD: noted fluid flushes of 100 mls Q 4 hrs ordered by NP [name], will maintain     4. Monitor blood glucose levels (goal: 70-180 mg/dL) and treat with medication changes as needed    5. Monitor daily weights - ordered    Recommendations discussed with RN [name].  -----------------------------------------------------------------------------------------------------------------                                                      Assessment Data:   Referral Source: RN Screen  Reason for Referral: New TF/MST 2 - Unsure Weight Loss    Nutrition: RDN met with pt in pt's room. RDN spoke with [name] RDN. Per [name] RDN, pt receiving Peptamen @ 70 mls/hr x 20 hrs. [name] RDN states pt previously on Isosource and tolerates well. RDN to change TF regimen to fiber containing formula and will monitor for tolerance, RN notified.   Spoke with RN, states pt tolerating TF well. Observed Promote currently infusing at 20 mls/hr with 0 ml residuals documented.   Pt with weight loss trend noted x past 2 months, possible significant weight loss since last admission (past 1 month).   Noted pt on reglan at this time.     Hospital Admission: Pt with chronic respiratory failure 2/2 neck fracture with resulting quadraplegia, leukocytosis, PNA,  bacteremia, a fib, HTN, azotemia    Medical Hx:  has a past medical history of C2 cervical fracture; Chronic pain; Conjunctivitis; Constipation; Cutaneous abscess of buttock; Dysphagia; Hypertension; Insomnia; Kidney disorder; and Respiratory arrest.  PSH: has a past surgical history that includes Tracheostomy and Gastrostomy tube placement.     Orders Placed This Encounter   Procedures   . Tube feeding-CONTINUOUS     Intake: 100% x past 1 meal, per RN flowsheet (noted pt not on po diet at this time)  Enteral: Infusion per [name] Pump history:  Past 24 hours: 88 mL, 100 mls free water  (6% of goal volume) -- noted TF initiated this morning at 0140    ANTHROPOMETRIC  Anthropometrics  Height: 185.4 cm (6' 0.99")  Weight: 94 kg (207 lb 3.7 oz)  Weight Change: 0  IBW/kg (Calculated) Male: 83.65 kg  IBW/kg (Calculated) Male: 74.95 kg  BMI (calculated): 27.4    Weight Monitoring 01/08/2017 01/08/2017 01/09/2017 01/10/2017 01/12/2017 01/19/2017 02/19/2017   Height - 185.4 cm 185.4 cm - - - 185.4 cm   Height Method - Estimated (No Data) - - - (No Data)  Weight 109 kg 97.75 kg 101.2 kg 100.699 kg 98.7 kg 103.874 kg 94 kg   Weight Method - - Bed Scale - Bed Scale Bed Scale Bed Scale   BMI (calculated) - 28.5 kg/m2 29.5 kg/m2 - - - 27.4 kg/m2     Weight History Summary: weight loss of 9.4% x past 1 month suggested, per RN flowsheet    Physical Assessment:   Unable to perform nutrition focused physical examination at this time due to pt sleeping/equipment in room, will attempt at next RDN assessment.   Edema: none, per RN flowsheet  Skin: not notable, per RN flowsheet  GI function: no BM since admission, per RN flowsheet    ESTIMATED NEEDS  Estimated Energy Needs  Total Energy Estimated Needs: 1880-2350 kcals/day  Method for Estimating Needs: 20-25 kcals/kg ABW of 94kg (BMI 27, tele pt)    Estimated Protein Needs  Total Protein Estimated Needs: 131-169 gm/day  Method for Estimating Needs: 1.4-1.8 gm/kg ABW of 94kg (BMI 27, tele pt,  previous documentation of stage 2 wound)    Fluid Needs  Method for Estimating Needs: 1 ml/kcal or per MD    Pertinent Medications: Reviewed; lipitor, reglan, vitamin C    IVF:    . dilTIAZem Stopped (02/19/17 0141)     Allergies   Allergen Reactions   . Dilaudid [Hydromorphone Hcl]      Pertinent labs: Reviewed; Glucose 91-92 x past 24 hrs    Learning Needs: Not appropriate at this time                                                               Nutrition Diagnosis      Inadequate energy intake related to chronic respiratory failure as evidenced by pt with TF not yet at goal volume.     Unintentional weight loss related to ?unknown etiology ad evidenced by pt with possible weight loss of 9.4% weight loss x past 1 month.                                                               Intervention     Nutrition recommendation -    1. Change TF regimen to Jevity 1.5 via PEG @ 20 mls/hr and advance by 20 mls Q 4 hrs to goal rate of 60 mls/hr.    Provides 2160 total kcals, 92 gm protein, and 1094 mls free water in 1440 total mls.      2. Provide Prosource TID (3 packets daily)   Each Prosource provides 15 gm protein.      EN (at goal rate) + Prosource provides 2340 kcals, 137 gm protein, and 1094 mls free water in 1440 total mls.   Meets 100% estimated kcal and protein needs.    3. Flush tube with minimum of 30-50 ml Q 4 hours for tube patency- additional fluids for hydration per MD: noted fluid flushes of 100 mls Q 4 hrs ordered by NP [name], will maintain     4. Monitor blood glucose levels (goal: 70-180 mg/dL) and treat with medication changes as  needed    5. Monitor daily weights - ordered     Goal: Pt will maintain weight during admission    Goal: Pt will toelrate >80% goal volume with blood glucose levels and electrolytes WNL                                                             Monitoring     Monitor TF infusion and tolerance, labs, GI function, and weight.                                                          Evaluation     Nutrition Risk Level: High (will follow up within 5 days and PRN)     [name], RDN   Ext. [phone]

## 2017-02-20 ENCOUNTER — Inpatient Hospital Stay: Payer: Medicare Other

## 2017-02-20 LAB — CBC AND DIFFERENTIAL
Absolute NRBC: 0 10*3/uL
Basophils Absolute Automated: 0.08 10*3/uL (ref 0.00–0.20)
Basophils Automated: 0.6 %
Eosinophils Absolute Automated: 0.4 10*3/uL (ref 0.00–0.70)
Eosinophils Automated: 2.8 %
Hematocrit: 36.1 % — ABNORMAL LOW (ref 42.0–52.0)
Hgb: 11.3 g/dL — ABNORMAL LOW (ref 13.0–17.0)
Immature Granulocytes Absolute: 0.11 10*3/uL — ABNORMAL HIGH
Immature Granulocytes: 0.8 %
Lymphocytes Absolute Automated: 1.4 10*3/uL (ref 0.50–4.40)
Lymphocytes Automated: 9.7 %
MCH: 26 pg — ABNORMAL LOW (ref 28.0–32.0)
MCHC: 31.3 g/dL — ABNORMAL LOW (ref 32.0–36.0)
MCV: 83.2 fL (ref 80.0–100.0)
MPV: 9.6 fL (ref 9.4–12.3)
Monocytes Absolute Automated: 1.36 10*3/uL — ABNORMAL HIGH (ref 0.00–1.20)
Monocytes: 9.4 %
Neutrophils Absolute: 11.09 10*3/uL — ABNORMAL HIGH (ref 1.80–8.10)
Neutrophils: 76.7 %
Nucleated RBC: 0 /100 WBC (ref 0.0–1.0)
Platelets: 305 10*3/uL (ref 140–400)
RBC: 4.34 10*6/uL — ABNORMAL LOW (ref 4.70–6.00)
RDW: 17 % — ABNORMAL HIGH (ref 12–15)
WBC: 14.44 10*3/uL — ABNORMAL HIGH (ref 3.50–10.80)

## 2017-02-20 LAB — BLOOD GAS, ARTERIAL
Arterial Total CO2: 24.7 mEq/L (ref 24.0–30.0)
Base Excess, Arterial: 4.8 mEq/L — ABNORMAL HIGH (ref <=2.0)
FIO2: 30 %
HCO3, Arterial: 27.6 mEq/L (ref 23.0–29.0)
O2 Sat, Arterial: 98.5 % (ref 95.0–100.0)
PEEP: 0
Rate: 16 {beats}/min
Temperature: 37
Tidal vol.: 500
pCO2, Arterial: 35.3 mmHg (ref 35.0–45.0)
pH, Arterial: 7.505 — ABNORMAL HIGH (ref 7.350–7.450)
pO2, Arterial: 105 mmHg — ABNORMAL HIGH (ref 80.0–90.0)

## 2017-02-20 LAB — BASIC METABOLIC PANEL
Anion Gap: 10 (ref 5.0–15.0)
BUN: 31 mg/dL — ABNORMAL HIGH (ref 9.0–28.0)
CO2: 25 mEq/L (ref 22–29)
Calcium: 9 mg/dL (ref 7.9–10.2)
Chloride: 99 mEq/L — ABNORMAL LOW (ref 100–111)
Creatinine: 0.7 mg/dL (ref 0.7–1.3)
Glucose: 109 mg/dL — ABNORMAL HIGH (ref 70–100)
Potassium: 4.1 mEq/L (ref 3.5–5.1)
Sodium: 134 mEq/L — ABNORMAL LOW (ref 136–145)

## 2017-02-20 LAB — GFR: EGFR: >60

## 2017-02-20 LAB — PROCALCITONIN: Procalcitonin: <0.1 (ref 0.0–0.1)

## 2017-02-20 LAB — GLUCOSE WHOLE BLOOD - POCT
Whole Blood Glucose POCT: 102 mg/dL — ABNORMAL HIGH (ref 70–100)
Whole Blood Glucose POCT: 127 mg/dL — ABNORMAL HIGH (ref 70–100)
Whole Blood Glucose POCT: 129 mg/dL — ABNORMAL HIGH (ref 70–100)
Whole Blood Glucose POCT: 138 mg/dL — ABNORMAL HIGH (ref 70–100)
Whole Blood Glucose POCT: 146 mg/dL — ABNORMAL HIGH (ref 70–100)

## 2017-02-20 LAB — HEMOLYSIS INDEX: Hemolysis Index: 36 — ABNORMAL HIGH (ref 0–18)

## 2017-02-20 MED ORDER — VENELEX EX OINT
TOPICAL_OINTMENT | Freq: Two times a day (BID) | CUTANEOUS | Status: DC
Start: 2017-02-20 — End: 2017-02-23
  Filled 2017-02-20: qty 60

## 2017-02-20 MED ORDER — VENELEX EX OINT
TOPICAL_OINTMENT | CUTANEOUS | Status: DC | PRN
Start: 2017-02-20 — End: 2017-02-23

## 2017-02-20 NOTE — Progress Notes (Signed)
CM called pt's daughter [name] and left VM.   Awaiting call back.    Garry Heater, RN MSN  Clinical Care Manager  [phone]

## 2017-02-20 NOTE — Progress Notes (Signed)
PROGRESS NOTE                                                 Gean Quint MD, [place], [place]                                                             [phone]    Date Time: 02/20/17 4:06 PM  Patient Name: Fluegge,Ronald GRADY        Pt seen by DrChauhan and Jesse Sans, NP concurrently.   Assessment:     Chronic respiratory failure, patient has chronic tracheostomy to  Ventilator, secondary to prior neck fracture with quadriplegia   Leukocytosis.,  Improving  Pneumonia, Pseudomonas and  Serratia from most recent culture  Chest CT with poss pneumothorax, highly unlikely, most likely scar tissue   Resp alkalosis     Other medical conditions  Altered mental status/agitation on admission, none currently observed  Atrial fibrillation  Sacral ulcer present on admission  Recent lactobacillus bacteremia  Dehydration    Plan:   Decrease vent rate to 10, add peep 5  ABG in am  Vent support.  Trach care.  Suction when necessary  Follow procalcitonin   ID following  abx per ID  Wound RN following     POC d/w RN, pt   Subjective:   Pt calm and no distress.    Medications:     Current Facility-Administered Medications   Medication Dose Route Frequency   . amiodarone  200 mg per G tube Daily   . amLODIPine  5 mg per G tube Daily   . atorvastatin  20 mg per G tube QHS   . balsam peru-castor oil (VENELEX)   Topical Q12H   . famotidine  20 mg per G tube QHS   . metoclopramide  5 mg per G tube [facility]   . nystatin  400,000 Units Oral QID   . QUEtiapine  25 mg per G tube BID   . rivaroxaban  20 mg per G tube Daily with dinner   . scopolamine  1 patch Transdermal Q72H   . simethicone  80 mg per G tube [facility]   . vitamin C  500 mg per G tube BID       Review of Systems:    Cannot do ROS 2ary to pt's condition     Physical Exam:     Vitals:    02/20/17 1553   BP:    Pulse:    Resp:    Temp: 97.9 F (36.6 C)   SpO2:          Intake/Output Summary (Last 24 hours) at 02/20/17 1606  Last data filed at 02/20/17 0800   Gross per 24 hour    Intake              400 ml   Output              350 ml   Net               50 ml       General appearance - alert, chronically ill appearing, and in  no distress  Mental status - alert  Eyes - pupils equal and reactive  Nose - normal and patent  Mouth - mucous membranes moist  Neck - Supple, trach and vent   Lymphatics - deferred   Chest - clear to auscultation, no wheezes, rales or rhonchi, symmetric air entry  Heart - normal rate, irregular rhythm, normal S1, S2  Abdomen - soft, nontender, bowel sounds normal  Neurological - alert, no focal findings or movement disorder noted  Extremities - peripheral pulses normal, no pedal edema  Skin - normal coloration and turgor    Labs:     CBC w/Diff CMP     Recent Labs  Lab 02/20/17  0411 02/19/17  0528 02/18/17  2233   WBC 14.44* 15.13* 19.28*   Hgb 11.3* 12.8* 12.2*   Hematocrit 36.1* 39.3* 38.5*   Platelets 305 285 374   MCV 83.2 83.3 82.6   Neutrophils 76.7 77.5 83.0       PT/INR           Recent Labs  Lab 02/20/17  0411 02/19/17  0528 02/18/17  2233   Sodium 134* 136 137   Potassium 4.1 4.2 4.2   Chloride 99* 98* 96*   CO2 25 25 29    BUN 31.0* 33.0* 35.0*   Creatinine 0.7 0.8 0.8   Glucose 109* 91 92   Calcium 9.0 9.7 10.3*   Protein, Total  --   --  7.2   Albumin  --   --  3.2*   AST (SGOT)  --   --  16   ALT  --   --  22   Alkaline Phosphatase  --   --  81   Bilirubin, Total  --   --  0.5      Glucose POCT     Recent Labs  Lab 02/20/17  0411 02/19/17  0528 02/18/17  2233 02/16/17  1000   Glucose 109* 91 92 76          Recent Labs  Lab 02/18/17  2233   Troponin I <0.01         ABGs:  ABG CollectionSite   Date Value Ref Range Status   02/20/2017 Left Radl  Final     Allen's Test   Date Value Ref Range Status   02/20/2017 No PT NOT ABLE  Final     pH, Arterial   Date Value Ref Range Status   02/20/2017 7.505 (H) 7.350 - 7.450 Final     pCO2, Arterial   Date Value Ref Range Status   02/20/2017 35.3 35.0 - 45.0 mmHg Final     pO2, Arterial   Date Value Ref Range Status    02/20/2017 105.0 (H) 80.0 - 90.0 mmHg Final     HCO3, Arterial   Date Value Ref Range Status   02/20/2017 27.6 23.0 - 29.0 mEq/L Final     Base Excess, Arterial   Date Value Ref Range Status   02/20/2017 4.8 (H) -2.0 - 2.0 mEq/L Final     O2 Sat, Arterial   Date Value Ref Range Status   02/20/2017 98.5 95.0 - 100.0 % Final       Urinalysis    Recent Labs  Lab 02/19/17  1739   Urine Type Catheterized, I   Color, UA Yellow   Clarity, UA Hazy   Specific Gravity UA 1.020   Urine pH 5.0   Nitrite, UA Negative   Ketones UA Negative  Urobilinogen, UA Negative   Bilirubin, UA Negative   Blood, UA Negative   RBC, UA 0 - 2   WBC, UA 0 - 5         Rads:     Radiology Results (24 Hour)     Procedure Component Value Units Date/Time    XR Chest AP Portable [[id number]] Collected:  02/20/17 7253    Order Status:  Completed Updated:  02/20/17 0720    Narrative:       XR CHEST AP PORTABLE    CLINICAL INDICATION:   pna    COMPARISON: 02/18/2017    FINDINGS: The cardiomediastinal silhouette has not shown significant  interval change allowing for differences in patient positioning. There  is mildly increased prominence of the perihilar vascular markings. Small  bilateral pleural effusions do not appear significantly changed. There  is no evidence for pneumothorax. Tracheostomy tube remains in  satisfactory position.        Impression:        Mild interval worsening of pulmonary vascular congestion.    Sandie Ano, MD   02/20/2017 7:15 AM    CT Chest WO Contrast [[id number]] Collected:  02/19/17 1733    Order Status:  Completed Updated:  02/19/17 1758    Narrative:       CT CHEST WITHOUT CONTRAST    CLINICAL STATEMENT: Shortness of breath.     COMPARISON: Chest radiograph performed on 02/18/2017 and 01/24/2017.     TECHNIQUE: Multiple axial images were obtained from the thoracic inlet  to the upper abdomen without the administration of intravenous contrast.  Reformatted sagittal and coronal images were obtained.    Note that CT scanning  at this site utilizes multiple dose reduction  techniques including automatic exposure control, adjustment of the MAS  and/or KVP according to patient size, and use of iterative  reconstruction technique.    FINDINGS: The study is performed without intravenous contrast therefore  evaluation of the vascular structures and hilar structures is limited.    A tracheostomy tube is present, its tip ends above the carina.    Lymph Nodes: There is no mediastinal, or axillary lymphadenopathy.     Great Vessels: Mild atherosclerotic calcification and tortuosity is  noted of the thoracic aorta and its branches. Atherosclerotic  calcification is noted of the coronary arteries.     Pleural Fluid/pleura: There are small volume bilateral pleural  effusions. No pericardial effusion is present.     Trace air is present within the anterior right pleural space just to the  caudal right middle lobe.    Lung Parenchyma: Hypoventilatory changes are identified of both lungs  geographic areas of groundglass consolidation are noted of the upper  lungs with associated smooth thickening of the interlobular septa. Mild  subpleural fibrotic changes are identified of both lung bases.    Miscellaneous: The trachea and central airways are patent.    Upper Abdomen: The adrenal glands are unremarkable. Atherosclerotic  calcification is noted of the upper abdominal aorta and its branches.    Musculoskeletal: The osseous structures are intact. The patients bones  are osteopenic. There is an exaggerated thoracic kyphotic curvature.      Impression:         1. Trace air within the anterior right pleural space suspicious for a  trace pneumothorax. Air may have been introduced iatrogenically or due  to a ruptured bleb and clinical correlation is recommended. Short-term  follow up CT of the chest or chest radiograph may  be performed.  2. Small bilateral pleural effusions.     The findings were discussed and confirmed with nurse  [name] on  02/19/2017 at  5:50 PM.            Fonnie Mu, MD   02/19/2017 5:54 PM            Gean Quint, MD    02/20/2017  4:06 PM

## 2017-02-20 NOTE — Progress Notes (Signed)
Infectious Diseases & Tropical Medicine  Progress Note    02/20/2017   [name] UJW:[id number],OZH:[id number] is a 83 y.o. male,       Assessment:      Chronic respiratory failure-Tracheostomy in place   Atrial fibrillation    No clear evidence of an infection.   Blood cultures no growth to date   Recently treated pneumonia, urinary tract infection, lactobacillus bacteremia.   Quadriplegia.   Leukocytosis improving    Plan:      Continue monitoring without antibiotics   Pro-calcitonin level pending   Follow-up chest x-ray   Continue probiotics   Monitor electrolytes and renal functions closely   Monitor clinically                ROS:     General:  no fever, no chills, no rigor, awake and alert,comfortable  HEENT: no neck pain, no throat pain,tracheostomy in place  Endocrine:  no fatigue, no night sweats  Respiratory: no cough, shortness of breath, or wheezing   Cardiovascular: no chest pain   Gastrointestinal: no abdominal pain,no N/V/D  Genito-Urinary: no dysuria or hematuria  Musculoskeletal: no edema  Neurological: Quadriplegia, awake and alert  Dermatological: No rash    Physical Examination:     Blood pressure 122/62, pulse 81, temperature 98.1 F (36.7 C), temperature source Axillary, resp. rate 16, height 1.854 m (6' 0.99"), weight 95.9 kg (211 lb 6.7 oz), SpO2 99 %.     General Appearance: Comfortable, and in no acute distress.   HEENT: Pupils are equal, round, and reactive to light.    Lungs:  Clear to auscultation   Heart:  Regular rate and rhythm   Chest: Symmetric chest wall expansion.    Abdomen: soft ,non tender,no hepatosplenomegaly,good bowel sounds, G-tube in place   Neurological: Awake and alert   Extremities: No edema    Laboratory And Diagnostic Studies:     Recent Labs      02/20/17   0411  02/19/17   0528   WBC  14.44*  15.13*   Hgb  11.3*  12.8*   Hematocrit  36.1*  39.3*   Platelets  305  285     Recent Labs      02/20/17   0411  02/19/17   0528   Sodium  134*  136    Potassium  4.1  4.2   Chloride  99*  98*   CO2  25  25   BUN  31.0*  33.0*   Creatinine  0.7  0.8   Glucose  109*  91   Calcium  9.0  9.7     Recent Labs      02/18/17   2233   AST (SGOT)  16   ALT  22   Alkaline Phosphatase  81   Protein, Total  7.2   Albumin  3.2*       Current Meds:      Scheduled Meds: PRN Meds:      amiodarone 200 mg per G tube Daily   amLODIPine 5 mg per G tube Daily   atorvastatin 20 mg per G tube QHS   famotidine 20 mg per G tube QHS   metoclopramide 5 mg per G tube Q8H [facility]   nystatin 400,000 Units Oral QID   QUEtiapine 25 mg per G tube BID   rivaroxaban 20 mg per G tube Daily with dinner   scopolamine 1 patch Transdermal Q72H   simethicone 80 mg per G tube  Q8H [facility]   vitamin C 500 mg per G tube BID       Continuous Infusions:  . sodium chloride 60 mL/hr at 02/20/17 0700      acetaminophen 650 mg Q6H PRN   albuterol-ipratropium 3 mL Q4H PRN   bisacodyl 10 mg QD PRN   senna 17.2 mg QD PRN         Skyelyn Scruggs A. Janalyn Rouse, M.D.  02/20/2017  9:43 AM

## 2017-02-20 NOTE — Progress Notes (Signed)
[facility]  INTERNAL MEDICINE PROGRESS NOTE    Date Time: 02/20/17 7:28 AM  Patient Name: [name],[name]      Problem List:      Acute on Chronic Respiratory Failure   Leukocytosis   Pneumonia   Chronic Atrial Fibrillation   Hypertension   Cardiomyopathy with an EF of 40%    Plan:      Continue with vent support and wean as possible to trach collar   Continue to monitor without antibiotics   Pro-calcitonin result pending   Renal fucntion and electrolytes are stable and normal.    CT chest result noted and discussed with Dr. Graciela Husbands   GI prophylaxis   DVT prophylaxis   Discussed plan of care with patient .   Discussed plan of care with nurses.   Discussed case with consultants.    Subjective:     [name] is more alert today and smiling. He is hard of hearing. He has no fever overnight.       Medications:   Medications:   Scheduled Meds: PRN Meds:        amiodarone 200 mg per G tube Daily   amLODIPine 5 mg per G tube Daily   atorvastatin 20 mg per G tube QHS   famotidine 20 mg per G tube QHS   metoclopramide 5 mg per G tube Q8H [facility]   nystatin 400,000 Units Oral QID   QUEtiapine 25 mg per G tube BID   rivaroxaban 20 mg per G tube Daily with dinner   scopolamine 1 patch Transdermal Q72H   simethicone 80 mg per G tube [facility]   vitamin C 500 mg per G tube BID         Continuous Infusions:   . sodium chloride 60 mL/hr at 02/19/17 1516          acetaminophen 650 mg Q6H PRN   albuterol-ipratropium 3 mL Q4H PRN   bisacodyl 10 mg QD PRN   senna 17.2 mg QD PRN             Physical Exam:     VITAL SIGNS   Temp:  [97.8 F (36.6 C)-98.9 F (37.2 C)] 97.8 F (36.6 C)  Heart Rate:  [83-108] 83  Resp Rate:  [16-18] 16  BP: (96-122)/(56-62) 122/62  FiO2:  [30 %-40 %] 30 %  POCT Glucose Result (Read Only)  Avg: 105  Min: 102  Max: 108  SpO2: 98 %    Intake/Output Summary (Last 24 hours) at 02/20/17 0728  Last data filed at 02/19/17 1735   Gross per 24 hour   Intake              100 ml   Output               350 ml   Net             -250 ml         General: Awake, alert, smiles to verbal stimuli and words with his lips   Heent: pinkish conjunctiva, anicteric sclera, moist mucus membrane   Neck: tracheostomy in situ.   Cvs: S1 & S 2 well heard, regular rate and rhythm   Chest: Clear to auscultation   Abdomen: Soft, non-tender, active bowel sounds.   Ext : no cyanosis, no edema   CNS: Alert, follows command, no focal deficits    Laboratory Results:     CHEMISTRY:     Recent Labs  Lab 02/20/17  0411 02/19/17  0528 02/18/17  2233 02/16/17  1000   Glucose 109* 91 92 76   BUN 31.0* 33.0* 35.0* 34.0*   Creatinine 0.7 0.8 0.8 0.7   Calcium 9.0 9.7 10.3* 9.1   Sodium 134* 136 137 136   Potassium 4.1 4.2 4.2 4.3   Chloride 99* 98* 96* 96*   CO2 25 25 29 26    Albumin  --   --  3.2*  --    AST (SGOT)  --   --  16  --    ALT  --   --  22  --    Bilirubin, Total  --   --  0.5  --    Alkaline Phosphatase  --   --  81  --        Recent Labs  Lab 02/18/17  2233   Troponin I <0.01       HEMATOLOGY:    Recent Labs  Lab 02/20/17  0411 02/19/17  0528 02/18/17  2233 02/16/17  1600   WBC 14.44* 15.13* 19.28* 14.45*   Hgb 11.3* 12.8* 12.2* 11.9*   Hematocrit 36.1* 39.3* 38.5* 37.2*   MCV 83.2 83.3 82.6 83.4   MCH 26.0* 27.1* 26.2* 26.7*   MCHC 31.3* 32.6 31.7* 32.0   Platelets 305 285 374 262     URINALYSIS:    Recent Labs  Lab 02/19/17  1739   Urine Type Catheterized, I   Color, UA Yellow   Clarity, UA Hazy   Specific Gravity UA 1.020   Urine pH 5.0   Nitrite, UA Negative   Ketones UA Negative   Urobilinogen, UA Negative   Bilirubin, UA Negative   Blood, UA Negative   RBC, UA 0 - 2   WBC, UA 0 - 5       MICROBIOLOGY:  Microbiology Results     Procedure Component Value Units Date/Time    Blood Culture Aerobic/Anaerobic #1 [[id number]] Collected:  02/18/17 2233    Specimen:  Arm from Blood, Venipuncture Updated:  02/20/17 0221    Narrative:       ORDER#: [id number]                                    ORDERED BY: NITZBERG,  MICHA  SOURCE: Blood, Venipuncture arm                      COLLECTED:  02/18/17 22:33  ANTIBIOTICS AT COLL.:                                RECEIVED :  02/19/17 01:44  Culture Blood Aerobic and Anaerobic        PRELIM      02/20/17 02:21  02/20/17   No Growth after 1 day/s of incubation.      Blood Culture Aerobic/Anaerobic #2 [[id number]] Collected:  02/18/17 2233    Specimen:  Arm from Blood, Venipuncture Updated:  02/20/17 0221    Narrative:       ORDER#: [id number]                                    ORDERED BY: NITZBERG, MICHA  SOURCE: Blood, Venipuncture arm  COLLECTED:  02/18/17 22:33  ANTIBIOTICS AT COLL.:                                RECEIVED :  02/19/17 01:44  Culture Blood Aerobic and Anaerobic        PRELIM      02/20/17 02:21  02/20/17   No Growth after 1 day/s of incubation.      CULTURE + Dierdre Forth [[id number]] Collected:  02/19/17 1739    Specimen:  Sputum from Sputum, Suctioned Updated:  02/20/17 0007    Narrative:       ORDER#: [id number]                                    ORDERED BY: Nicholes Calamity  SOURCE: Sputum, Suctioned ETT                        COLLECTED:  02/19/17 17:39  ANTIBIOTICS AT COLL.:                                RECEIVED :  02/19/17 22:07  Stain, Gram (Respiratory)                  FINAL       02/20/17 00:07  02/20/17   Few WBC's             Few Squamous epithelial cells             Few Mixed Respiratory Flora  Culture and Gram Stain, Aerobic, RespiratorPENDING      MRSA Culture [[id number]] Collected:  02/18/17 2233    Specimen:  Body Fluid from Nasal/Throat ASC Admission Updated:  02/20/17 0033    Narrative:       ORDER#: [id number]                                    ORDERED BY: [name]  SOURCE: Nares and Throat                             COLLECTED:  02/18/17 22:33  ANTIBIOTICS AT COLL.:                                RECEIVED :  02/19/17 01:56  Culture MRSA Surveillance                  FINAL       02/20/17 00:29  02/20/17   Negative  for Methicillin Resistant Staph aureus from Nares and             Negative for Methicillin Resistant Staph aureus from Throat      Rapid influenza A/B antigens (Flu) [[id number]] Collected:  02/19/17 0037    Specimen:  Nasopharyngeal from Nasal Aspirate Updated:  02/19/17 0108    Narrative:       ORDER#: [id number]                                    ORDERED BY: Burgess Estelle  SOURCE: Nasal Aspirate                               COLLECTED:  02/19/17 00:37  ANTIBIOTICS AT COLL.:                                RECEIVED :  02/19/17 00:43  Influenza Rapid Antigen A&B                FINAL       02/19/17 01:05  02/19/17   Negative for Influenza A and B             Reference Range: Negative          Radiology Results:   Radiological Procedure reviewed.  Ct Head Wo Contrast    Result Date: 02/18/2017    No acute intracranial abnormality. Wyatt Portela, MD 02/18/2017 10:15 PM    Ct Chest Wo Contrast    Result Date: 02/19/2017  1. Trace air within the anterior right pleural space suspicious for a trace pneumothorax. Air may have been introduced iatrogenically or due to a ruptured bleb and clinical correlation is recommended. Short-term follow up CT of the chest or chest radiograph may be performed. 2. Small bilateral pleural effusions. The findings were discussed and confirmed with nurse  [name] on 02/19/2017 at 5:50 PM.  Fonnie Mu, MD 02/19/2017 5:54 PM    Ct Abdomen Pelvis W Iv Contrast Only    Result Date: 01/26/2017  1. Improving small bowel obstruction. 2. Inflammatory changes adjacent to the second portion of the duodenum favoring duodenitis. 3. Bilateral pleural effusions and bibasilar atelectasis. Right middle lobe infiltrate is not excluded. 4. Cholelithiasis. 5. Right nephrolithiasis. 6. Additional chronic findings as above. Jorene Guest, MD 01/26/2017 12:16 AM    Xr Chest Ap Portable    Result Date: 02/20/2017   Mild interval worsening of pulmonary vascular congestion. Sandie Ano, MD 02/20/2017 7:15 AM    Xr Chest  Ap  Portable    Result Date: 02/18/2017    Stable probable small bilateral pleural effusions and bibasilar airspace disease. Wyatt Portela, MD 02/18/2017 9:02 PM    Xr Chest Ap Portable    Result Date: 01/24/2017   Stable chest Gaynelle Arabian, MD 01/24/2017 11:07 AM    Xr Abdomen Portable    Result Date: 01/24/2017   Multiple dilated small bowel loops consistent with mechanical obstruction or ileus. Gustavus Messing, MD 01/24/2017 5:10 PM      Signed by: Ledora Bottcher M.D.  Spectra Link: [id number]  Office Phone Number : [phone]

## 2017-02-20 NOTE — Plan of Care (Signed)
Problem: Safety  Goal: Patient will be free from injury during hospitalization  Outcome: Progressing   02/20/17 1106   Goal/Interventions addressed this shift   Patient will be free from injury during hospitalization  Hourly rounding;Use appropriate transfer methods;Assess patient's risk for falls and implement fall prevention plan of care per policy;Provide and maintain safe environment;Provide alternative method of communication if needed (communication boards, writing)       Problem: Pain  Goal: Pain at adequate level as identified by patient  Outcome: Progressing   02/20/17 1106   Goal/Interventions addressed this shift   Pain at adequate level as identified by patient Identify patient comfort function goal;Offer non-pharmacological pain management interventions;Evaluate patient's satisfaction with pain management progress       Problem: Nutrition  Goal: Nutritional intake is adequate  Outcome: Progressing   02/20/17 1106   Goal/Interventions addressed this shift   Nutritional intake is adequate Monitor daily weights;Encourage/administer dietary supplements as ordered (i.e. tube feed, TPN, oral, OGT/NGT, supplements);Assess anorexia, appetite, and amount of meal/food tolerated

## 2017-02-20 NOTE — Plan of Care (Incomplete)
Problem: Compromised Tissue integrity  Goal: Damaged tissue is healing and protected  Outcome: Not Progressing   02/20/17 0447   Goal/Interventions addressed this shift   Damaged tissue is healing and protected  Reposition patient every 2 hours and as needed unless able to reposition self;Relieve pressure to bony prominences for patients at moderate and high risk;Keep intact skin clean and dry;Use incontinence wipes for cleaning urine, stool and caustic drainage. Foley care as needed       Problem: Inadequate Gas Exchange  Goal: Adequate oxygenation and improved ventilation  Outcome: Progressing   02/20/17 0447   Goal/Interventions addressed this shift   Adequate oxygenation and improved ventilation Provide mechanical and oxygen support to facilitate gas exchange;Position for maximum ventilatory efficiency;Teach/reinforce use of incentive spirometer 10 times per hour while awake, cough and deep breath as needed;Plan activities to conserve energy: plan rest periods     Goal: Patent Airway maintained  Outcome: Progressing   02/20/17 0447   Goal/Interventions addressed this shift   Patent airway maintained  Suction secretions as needed;Reinforce use of ordered respiratory interventions (i.e. CPAP, BiPAP, Incentive Spirometer, Acapella, etc.)       Problem: Nutrition  Goal: Nutritional intake is adequate  Outcome: Progressing   02/20/17 0447   Goal/Interventions addressed this shift   Nutritional intake is adequate Monitor daily weights;Encourage/administer dietary supplements as ordered (i.e. tube feed, TPN, oral, OGT/NGT, supplements);Encourage/perform oral hygiene as appropriate

## 2017-02-21 ENCOUNTER — Inpatient Hospital Stay: Payer: Medicare Other

## 2017-02-21 LAB — CBC AND DIFFERENTIAL
Absolute NRBC: 0 10*3/uL
Basophils Absolute Automated: 0.08 10*3/uL (ref 0.00–0.20)
Basophils Automated: 0.5 %
Eosinophils Absolute Automated: 0.43 10*3/uL (ref 0.00–0.70)
Eosinophils Automated: 2.7 %
Hematocrit: 35.1 % — ABNORMAL LOW (ref 42.0–52.0)
Hgb: 10.9 g/dL — ABNORMAL LOW (ref 13.0–17.0)
Immature Granulocytes Absolute: 0.13 10*3/uL — ABNORMAL HIGH
Immature Granulocytes: 0.8 %
Lymphocytes Absolute Automated: 1.67 10*3/uL (ref 0.50–4.40)
Lymphocytes Automated: 10.5 %
MCH: 26.5 pg — ABNORMAL LOW (ref 28.0–32.0)
MCHC: 31.1 g/dL — ABNORMAL LOW (ref 32.0–36.0)
MCV: 85.4 fL (ref 80.0–100.0)
MPV: 9.6 fL (ref 9.4–12.3)
Monocytes Absolute Automated: 1.6 10*3/uL — ABNORMAL HIGH (ref 0.00–1.20)
Monocytes: 10 %
Neutrophils Absolute: 12.06 10*3/uL — ABNORMAL HIGH (ref 1.80–8.10)
Neutrophils: 75.5 %
Nucleated RBC: 0 /100 WBC (ref 0.0–1.0)
Platelets: 308 10*3/uL (ref 140–400)
RBC: 4.11 10*6/uL — ABNORMAL LOW (ref 4.70–6.00)
RDW: 17 % — ABNORMAL HIGH (ref 12–15)
WBC: 15.97 10*3/uL — ABNORMAL HIGH (ref 3.50–10.80)

## 2017-02-21 LAB — HEMOLYSIS INDEX: Hemolysis Index: 57 — ABNORMAL HIGH (ref 0–18)

## 2017-02-21 LAB — BLOOD GAS, ARTERIAL
Arterial Total CO2: 25.3 mEq/L (ref 24.0–30.0)
Base Excess, Arterial: 4.7 mEq/L — ABNORMAL HIGH (ref <=2.0)
FIO2: 30 %
HCO3, Arterial: 28 mEq/L (ref 23.0–29.0)
O2 Sat, Arterial: 99.1 % (ref 95.0–100.0)
PEEP: 5
Rate: 10 {beats}/min
Temperature: 37
Tidal vol.: 500
pCO2, Arterial: 37.7 mmHg (ref 35.0–45.0)
pH, Arterial: 7.483 — ABNORMAL HIGH (ref 7.350–7.450)
pO2, Arterial: 134 mmHg — ABNORMAL HIGH (ref 80.0–90.0)

## 2017-02-21 LAB — BASIC METABOLIC PANEL
Anion Gap: 11 (ref 5.0–15.0)
BUN: 26 mg/dL (ref 9.0–28.0)
CO2: 24 mEq/L (ref 22–29)
Calcium: 8.3 mg/dL (ref 7.9–10.2)
Chloride: 101 mEq/L (ref 100–111)
Creatinine: 0.7 mg/dL (ref 0.7–1.3)
Glucose: 100 mg/dL (ref 70–100)
Potassium: 4.3 mEq/L (ref 3.5–5.1)
Sodium: 136 mEq/L (ref 136–145)

## 2017-02-21 LAB — GFR: EGFR: >60

## 2017-02-21 LAB — GLUCOSE WHOLE BLOOD - POCT
Whole Blood Glucose POCT: 112 mg/dL — ABNORMAL HIGH (ref 70–100)
Whole Blood Glucose POCT: 116 mg/dL — ABNORMAL HIGH (ref 70–100)
Whole Blood Glucose POCT: 128 mg/dL — ABNORMAL HIGH (ref 70–100)

## 2017-02-21 NOTE — Plan of Care (Signed)
Problem: Safety  Goal: Patient will be free from injury during hospitalization  Outcome: Progressing   02/21/17 1801   Goal/Interventions addressed this shift   Patient will be free from injury during hospitalization  Hourly rounding;Assess patient's risk for falls and implement fall prevention plan of care per policy;Provide and maintain safe environment;Include patient/ family/ care giver in decisions related to safety;Use appropriate transfer methods       Problem: Pain  Goal: Pain at adequate level as identified by patient  Outcome: Progressing   02/21/17 1801   Goal/Interventions addressed this shift   Pain at adequate level as identified by patient Identify patient comfort function goal;Offer non-pharmacological pain management interventions;Include patient/patient care companion in decisions related to pain management as needed       Problem: Inadequate Gas Exchange  Goal: Adequate oxygenation and improved ventilation  Outcome: Progressing   02/21/17 1801   Goal/Interventions addressed this shift   Adequate oxygenation and improved ventilation Assess lung sounds;Position for maximum ventilatory efficiency;Plan activities to conserve energy: plan rest periods;Increase activity as tolerated/progressive mobility;Consult/collaborate with Respiratory Therapy       Problem: Artificial Airway  Goal: Tracheostomy will be maintained  Outcome: Progressing   02/21/17 1801   Goal/Interventions addressed this shift   Tracheostomy will be maintained Suction secretions as needed;Keep head of bed at 30 degrees, unless contraindicated;Encourage/perform oral hygiene as appropriate;Perform deep oropharyngeal suctioning at least every 4 hours;Apply water-based moisturizer to lips;Tracheostomy care every shift and as needed;Maintain surgical airway kit or tracheostomy tray at bedside;Keep additional tracheostomy tube of the same size and one size smaller at bedside       Problem: Inadequate Tissue Perfusion-Venous  Goal: Tissue  perfusion is adequate-venous  Outcome: Progressing   02/21/17 1801   Goal/Interventions addressed this shift   Tissue perfusion is adequate-venous  VTE prevention: Administer anticoagulant(s) and/or apply anti-embolism stockings/devices as ordered       Comments: Pt is A/o to self, remained on PRVC mode, Fio2 30%, TV: 450, RR: 10, peep: 5, oral/inline suction done every two hours and PRN. Afib with rate controled HR:70's-90's, BP:90's-100's, afebrile, wound care/peri care/oral care/trach care done. Pt turned and repositioned every two hours and PRN, remained on Jevity tube feeding with rate of 60, tolerating well with no residue. Pt's family at the bedside and updated. Will continue to monitor per plan of care.

## 2017-02-21 NOTE — Progress Notes (Signed)
02/20/17    30 day readmit    Pt was admitted from [facility]; LTC for chronic trach/vent. Pt is total care.  CM received a VM from pt's daughter who is concerned with too many readmissions. She is requesting a different facility. CM sent referrals in [facility] to [facility] and SNFs. CM called daughter back but she was not available at that moment. CM left a VM.      Garry Heater, RN MSN  Clinical Care Manager  [phone]       02/21/17 [phone]   Patient Type   Within 30 Days of Previous Admission? Yes   Healthcare Decisions   Interviewed: Other (Comment)  (chart review)   Orientation/Decision Making Abilities of Patient Patient on ventilator   Prior to admission   Prior level of function Needs assistance with ADLs   Type of Residence Home care staff   Home Layout One level   Have running water, electricity, heat, etc? Yes   Living Arrangements Other (Comment)  Jonna Munro)   How do you get to your MD appointments? [name]   Who does Education officer, museum? [name]   Who picks up your prescriptions? [name]   Dressing Dependent   Grooming Dependent   Feeding Dependent   Bathing Dependent   Toileting Dependent   Prior SNF admission? (Detail) [name]   Adult Management consultant (APS) involved? No   Discharge Planning   Support Systems Children   Patient expects to be discharged to: LTACH vs SNF    Mode of transportation: Other  (ambulance)   Consults/Providers   PT Evaluation Needed 2   OT Evalulation Needed 2   SLP Evaluation Needed 2   Correct PCP listed in Epic? Yes

## 2017-02-21 NOTE — Progress Notes (Signed)
INTERNAL MEDICINE PROGRESS NOTE    Progress Note - [name]    Date Time: 02/21/17 11:01 AM  Patient Name: 83 y.o. male with <principal problem not specified>      Assessment :    Acute on Chronic Respiratory Failure   Leukocytosis   Recent Pneumonia and UTI   Chronic Atrial Fibrillation /CVR   Hypotension   Cardiomyopathy with an EF of 40%      Plan:    Sputum culture and urine culture results noted, probably colonization   [place]ai ptosis persists but no fever/ID not seen   Discontinue amlodipine as the patient blood pressure running low   Intensivist evaluation and recommendation appreciated   Gastrointestinal and deep vein thrombosis prophylaxis   Trach and vent care    Subjective:     Incomplete database      Review of Systems:    Incomplete database    Physical Exam:     Vitals:    02/21/17 1014   BP:    Pulse:    Resp:    Temp:    SpO2: 100%       Intake/Output Summary (Last 24 hours) at 02/21/17 1101  Last data filed at 02/21/17 1000   Gross per 24 hour   Intake             2674 ml   Output                0 ml   Net             2674 ml       General appearance - Awake, on trach and ventilator  Eyes - pupils equal and reactive, extraocular eye movements intact  Ears - bilateral TM's and external ear canals normal  Nose - normal and patent, no erythema, discharge or polyps  Mouth - mucous membranes moist, pharynx normal without lesions  Neck - supple, no significant adenopathy  Lymphatics - no palpable lymphadenopathy, no hepatosplenomegaly  Chest - clear to auscultation, no wheezes, rales or rhonchi, symmetric air entry  Heart - normal rate, regular rhythm, normal S1, S2, no murmurs, rubs, clicks or gallops  Abdomen - soft, nontender, nondistended, no masses or organomegaly  Back exam - full range of motion, no tenderness, palpable spasm or pain on motion  Neurological - alert, oriented, normal speech, no focal findings or movement disorder noted  Musculoskeletal - no joint tenderness,  deformity or swelling  Extremities - peripheral pulses normal, no pedal edema, no clubbing or cyanosis  Skin - normal coloration and turgor, no rashes, no suspicious skin lesions noted      Meds Reviewed: Yes  Medications:     Current Facility-Administered Medications   Medication Dose Route Frequency   . amiodarone  200 mg per G tube Daily   . atorvastatin  20 mg per G tube QHS   . balsam peru-castor oil (VENELEX)   Topical Q12H   . famotidine  20 mg per G tube QHS   . metoclopramide  5 mg per G tube [facility]   . nystatin  400,000 Units Oral QID   . QUEtiapine  25 mg per G tube BID   . rivaroxaban  20 mg per G tube Daily with dinner   . scopolamine  1 patch Transdermal Q72H   . simethicone  80 mg per G tube [facility]   . vitamin C  500 mg per G tube BID  PRN Medication:  acetaminophen, albuterol-ipratropium, balsam peru-castor oil (VENELEX), bisacodyl, senna      Labs:     Results     Procedure Component Value Units Date/Time    Glucose Whole Blood - POCT [[id number]]  (Abnormal) Collected:  02/21/17 0606     Updated:  02/21/17 0612     POCT - Glucose Whole blood 112 (H) mg/dL     Basic Metabolic Panel [[id number]] Collected:  02/21/17 0249    Specimen:  Blood Updated:  02/21/17 0440     Glucose 100 mg/dL      BUN 78.2 mg/dL      Creatinine 0.7 mg/dL      Calcium 8.3 mg/dL      Sodium 956 mEq/L      Potassium 4.3 mEq/L      Chloride 101 mEq/L      CO2 24 mEq/L      Anion Gap 11.0    Hemolysis index [[id number]]  (Abnormal) Collected:  02/21/17 0249     Updated:  02/21/17 0440     Hemolysis Index 57 (H)    GFR [[id number]] Collected:  02/21/17 0249     Updated:  02/21/17 0440     EGFR >60.0    CBC and differential [[id number]]  (Abnormal) Collected:  02/21/17 0249    Specimen:  Blood from Blood Updated:  02/21/17 0414     WBC 15.97 (H) x10 3/uL      Hgb 10.9 (L) g/dL      Hematocrit 27.2 (L) %      Platelets 308 x10 3/uL      RBC 4.11 (L) x10 6/uL      MCV 85.4 fL      MCH 26.5 (L) pg      MCHC 31.1 (L) g/dL      RDW  17 (H) %      MPV 9.6 fL      Neutrophils 75.5 %      Lymphocytes Automated 10.5 %      Monocytes 10.0 %      Eosinophils Automated 2.7 %      Basophils Automated 0.5 %      Immature Granulocyte 0.8 %      Nucleated RBC 0.0 /100 WBC      Neutrophils Absolute 12.06 (H) x10 3/uL      Abs Lymph Automated 1.67 x10 3/uL      Abs Mono Automated 1.60 (H) x10 3/uL      Abs Eos Automated 0.43 x10 3/uL      Absolute Baso Automated 0.08 x10 3/uL      Absolute Immature Granulocyte 0.13 (H) x10 3/uL      Absolute NRBC 0.00 x10 3/uL     Blood gas, arterial [[id number]]  (Abnormal) Collected:  02/21/17 0335    Specimen:  Blood, Arterial Updated:  02/21/17 0341     pH, Arterial 7.483 (H)     pCO2, Arterial 37.7 mmHg      pO2, Arterial 134.0 (H) mmHg      HCO3, Arterial 28.0 mEq/L      Arterial Total CO2 25.3 mEq/L      Base Excess, Arterial 4.7 (H) mEq/L      O2 Sat, Arterial 99.1 %      ABG CollectionSite Left Radl     Allen's Test Yes     Temperature 37.0     FIO2 30 %      Status Oxygen     O2 Delivery Ventilator  Rate 10 BPM      Mode: prvc     PEEP 5     Tidal vol. 500    Blood Culture Aerobic/Anaerobic #1 [[id number]] Collected:  02/18/17 2233    Specimen:  Arm from Blood, Venipuncture Updated:  02/21/17 0221    Narrative:       ORDER#: [id number]                                    ORDERED BY: NITZBERG, MICHA  SOURCE: Blood, Venipuncture arm                      COLLECTED:  02/18/17 22:33  ANTIBIOTICS AT COLL.:                                RECEIVED :  02/19/17 01:44  Culture Blood Aerobic and Anaerobic        PRELIM      02/21/17 02:21  02/20/17   No Growth after 1 day/s of incubation.  02/21/17   No Growth after 2 day/s of incubation.      Blood Culture Aerobic/Anaerobic #2 [[id number]] Collected:  02/18/17 2233    Specimen:  Arm from Blood, Venipuncture Updated:  02/21/17 0221    Narrative:       ORDER#: [id number]                                    ORDERED BY: NITZBERG, MICHA  SOURCE: Blood, Venipuncture arm                       COLLECTED:  02/18/17 22:33  ANTIBIOTICS AT COLL.:                                RECEIVED :  02/19/17 01:44  Culture Blood Aerobic and Anaerobic        PRELIM      02/21/17 02:21  02/20/17   No Growth after 1 day/s of incubation.  02/21/17   No Growth after 2 day/s of incubation.      Glucose Whole Blood - POCT [[id number]]  (Abnormal) Collected:  02/20/17 2220     Updated:  02/20/17 2241     POCT - Glucose Whole blood 127 (H) mg/dL     CULTURE + Dierdre Forth [[id number]] Collected:  02/19/17 1739    Specimen:  Sputum from Sputum, Suctioned Updated:  02/20/17 1859    Narrative:       ORDER#: [id number]                                    ORDERED BY: Nicholes Calamity  SOURCE: Sputum, Suctioned ETT                        COLLECTED:  02/19/17 17:39  ANTIBIOTICS AT COLL.:                                RECEIVED :  02/19/17 22:07  Stain, Gram (Respiratory)  FINAL       02/20/17 00:07  02/20/17   Few WBC's             Few Squamous epithelial cells             Few Mixed Respiratory Flora  Culture and Gram Stain, Aerobic, RespiratorPRELIM      02/20/17 18:59   +  02/20/17   Moderate growth of mixed upper respiratory flora  02/20/17   Moderate growth of Pseudomonas aeruginosa               Strain 1             No Further Workup Due To Mixed Culture with >3 Organisms    02/20/17   Moderate growth of Pseudomonas aeruginosa               Strain 2             No Further Workup Due To Mixed Culture with >3 Organisms.    02/20/17   Moderate growth of Providencia stuartii               No Further Workup Due To Mixed Culture with >3 Organisms."        Glucose Whole Blood - POCT [[id number]]  (Abnormal) Collected:  02/20/17 1542     Updated:  02/20/17 1608     POCT - Glucose Whole blood 138 (H) mg/dL     Procalcitonin [[id number]] Collected:  02/20/17 0411     Updated:  02/20/17 1259     Procalcitonin <0.1    Glucose Whole Blood - POCT [[id number]]  (Abnormal) Collected:  02/20/17 1205     Updated:   02/20/17 1234     POCT - Glucose Whole blood 146 (H) mg/dL             @MICROIP @    IRadiology:   Radiological Procedure reviewed.      Hershal Coria, MD  Spectra link [id number]  [facility] clinic  [phone]  02/21/2017  11:01 AM

## 2017-02-21 NOTE — Progress Notes (Signed)
Infectious Diseases & Tropical Medicine  Progress Note    02/21/2017   Dashan Chizmar NUU:[id number] is a 83 y.o. male,       Assessment:      Chronic respiratory failure-Tracheostomy in place.   Chest x-ray stable pleural effusions (02/21/2017)    Sputum culture-multiple organisms including Pseudomonas and Providencia stuartii (possibility of colonization)   Pro-calcitonin level- <0.1 (02/20/2017)   Atrial fibrillation    No clear evidence of an infection.   Blood cultures no growth to date   Recently treated pneumonia, urinary tract infection, lactobacillus bacteremia.   Quadriplegia.   Leukocytosis    Plan:      Continue monitoring without antibiotics   Repeat complete blood count in a.m.   Follow-up chest x-ray   Continue probiotics   Monitor electrolytes and renal functions closely   Monitor clinically            .   Discussed with pharmacy    ROS:     General:  no fever, no chills, no rigor, awake and alert,appears very comfortable  HEENT: no neck pain, no throat pain,tracheostomy in place  Endocrine:  no fatigue, no night sweats  Respiratory: no cough, shortness of breath, or wheezing   Cardiovascular: no chest pain   Gastrointestinal: no abdominal pain,no N/V/D  Genito-Urinary: no dysuria or hematuria  Musculoskeletal: no edema  Neurological: Quadriplegia, awake and alert  Dermatological: No rash    Physical Examination:     Blood pressure 97/56, pulse 73, temperature 98 F (36.7 C), temperature source Axillary, resp. rate (!) 11, height 1.854 m (6' 0.99"), weight 95.9 kg (211 lb 6.7 oz), SpO2 100 %.     General Appearance: Comfortable, and in no acute distress.   HEENT: Pupils are equal, round, and reactive to light.    Lungs:  Decreased breath sounds   Heart:  Regular rate and rhythm   Chest: Symmetric chest wall expansion.    Abdomen: soft ,non tender,no hepatosplenomegaly,good bowel sounds, G-tube in place   Neurological: Awake and alert   Extremities: No  edema    Laboratory And Diagnostic Studies:     Recent Labs      02/21/17   0249  02/20/17   0411   WBC  15.97*  14.44*   Hgb  10.9*  11.3*   Hematocrit  35.1*  36.1*   Platelets  308  305     Recent Labs      02/21/17   0249  02/20/17   0411   Sodium  136  134*   Potassium  4.3  4.1   Chloride  101  99*   CO2  24  25   BUN  26.0  31.0*   Creatinine  0.7  0.7   Glucose  100  109*   Calcium  8.3  9.0     Recent Labs      02/18/17   2233   AST (SGOT)  16   ALT  22   Alkaline Phosphatase  81   Protein, Total  7.2   Albumin  3.2*       Current Meds:      Scheduled Meds: PRN Meds:        amiodarone 200 mg per G tube Daily   amLODIPine 5 mg per G tube Daily   atorvastatin 20 mg per G tube QHS   balsam peru-castor oil (VENELEX)  Topical Q12H   famotidine 20 mg per G tube QHS   metoclopramide  5 mg per G tube [facility]   nystatin 400,000 Units Oral QID   QUEtiapine 25 mg per G tube BID   rivaroxaban 20 mg per G tube Daily with dinner   scopolamine 1 patch Transdermal Q72H   simethicone 80 mg per G tube [facility]   vitamin C 500 mg per G tube BID       Continuous Infusions:  . sodium chloride 60 mL/hr (02/21/17 0922)      acetaminophen 650 mg Q6H PRN   albuterol-ipratropium 3 mL Q4H PRN   balsam peru-castor oil (VENELEX)  PRN   bisacodyl 10 mg QD PRN   senna 17.2 mg QD PRN         Anquinette Pierro A. Janalyn Rouse, M.D.  02/21/2017  9:28 AM

## 2017-02-21 NOTE — Progress Notes (Addendum)
Cm met with pt's son Jesusita Oka [phone] and wife.  They do not want the pt to return to [facility]. They agreed w/ d/c to [facility].   CM spoke w/ Haile from [place] and they can accept. Son and wife in agreement w/ DCP.  Son's SNFs selections: [facility], [facility] (no particular order).     Garry Heater, RN MSN  Clinical Care Manager  [phone]

## 2017-02-21 NOTE — Progress Notes (Signed)
Pulmonary PROGRESS NOTE                                                 Gean Quint MD, [place], CMD                                                             [phone]    Date Time: 02/21/17 2:43 PM  Patient Name: [name],[name] 83 y.o. male admitted with <principal problem not specified>  Admit Date: 02/18/2017    Patient status: Inpatient  Hospital Day: 2           Assessment:   Chronic respiratory failure, patient has chronic tracheostomy.to  Ventilator, secondary to prior Fracture with quadriplegia  Pneumonia , pro-calcitonin less than 0.1, sputum culture with Pseudomonas and providenica stuartii ?  Colonization  CT chest with possible pneumothorax.  However, highly unlikely.  Most likely scar tissue  Leukocytosis, worsening    Other medical conditions  Encephalopathy, altered mental status, resolved  Atrial fibrillation.  On xarelto  Sacral ulcer present on admission  Recent lactobacillus bacteremia  Plan:   Continue vent support.  Decrease tidal volume to 450  Follow chest x-ray  Trach care.  Suction when necessary  Infectious disease following, recommend continuing to monitor without antibiotics as patient had recent antibiotics from prior admission  AM labs.  Deep vein thrombosis prophylaxis,xarelto  Plan of care discussed with nurse  Subjective:   Patient awake, vent, no acute distress    Medications:     Current Facility-Administered Medications   Medication Dose Route Frequency   . amiodarone  200 mg per G tube Daily   . atorvastatin  20 mg per G tube QHS   . balsam peru-castor oil (VENELEX)   Topical Q12H   . famotidine  20 mg per G tube QHS   . metoclopramide  5 mg per G tube [facility]   . nystatin  400,000 Units Oral QID   . QUEtiapine  25 mg per G tube BID   . rivaroxaban  20 mg per G tube Daily with dinner   . scopolamine  1 patch Transdermal Q72H   . simethicone  80 mg per G tube [facility]   . vitamin C  500 mg per G tube BID       Review of Systems:    Not able to do review of systems  secondary to patient condition    Physical Exam:     Vitals:    02/21/17 1222   BP:    Pulse:    Resp:    Temp: 97.5 F (36.4 C)   SpO2:    sat 100, pulse 77, bp 112/54 rr 19      Intake/Output Summary (Last 24 hours) at 02/21/17 1443  Last data filed at 02/21/17 1200   Gross per 24 hour   Intake             2814 ml   Output                0 ml   Net  2814 ml           General appearance - Alert, chronically ill-appearing, no acute respiratory distress on ventilator  Mental status - awake, alert  Eyes - deferred  Nose -normal and patent  Mouth - moist membranes  Neck - tracheostomy to ventilator intact  Chest - clear to auscultation, no wheezing  Heart - regular rate, irregular rhythm  Abdomen - soft, nontender, G-tube intact  Neurological - alert, no focal findings.  Patient has chronic quadriplegia  Extremities - no edema  Skin - number coloration    Labs:     CBC w/Diff CMP     Recent Labs  Lab 02/21/17  0249 02/20/17  0411 02/19/17  0528   WBC 15.97* 14.44* 15.13*   Hgb 10.9* 11.3* 12.8*   Hematocrit 35.1* 36.1* 39.3*   Platelets 308 305 285   MCV 85.4 83.2 83.3   Neutrophils 75.5 76.7 77.5       PT/INR           Recent Labs  Lab 02/21/17  0249 02/20/17  0411 02/19/17  0528 02/18/17  2233   Sodium 136 134* 136 137   Potassium 4.3 4.1 4.2 4.2   Chloride 101 99* 98* 96*   CO2 24 25 25 29    BUN 26.0 31.0* 33.0* 35.0*   Creatinine 0.7 0.7 0.8 0.8   Glucose 100 109* 91 92   Calcium 8.3 9.0 9.7 10.3*   Protein, Total  --   --   --  7.2   Albumin  --   --   --  3.2*   AST (SGOT)  --   --   --  16   ALT  --   --   --  22   Alkaline Phosphatase  --   --   --  81   Bilirubin, Total  --   --   --  0.5      Glucose POCT     Recent Labs  Lab 02/21/17  0249 02/20/17  0411 02/19/17  0528 02/18/17  2233 02/16/17  1000   Glucose 100 109* 91 92 76          Recent Labs  Lab 02/18/17  2233   Troponin I <0.01         ABGs:  ABG CollectionSite   Date Value Ref Range Status   02/21/2017 Left Radl  Final     Allen's Test    Date Value Ref Range Status   02/21/2017 Yes  Final     pH, Arterial   Date Value Ref Range Status   02/21/2017 7.483 (H) 7.350 - 7.450 Final     pCO2, Arterial   Date Value Ref Range Status   02/21/2017 37.7 35.0 - 45.0 mmHg Final     pO2, Arterial   Date Value Ref Range Status   02/21/2017 134.0 (H) 80.0 - 90.0 mmHg Final     HCO3, Arterial   Date Value Ref Range Status   02/21/2017 28.0 23.0 - 29.0 mEq/L Final     Base Excess, Arterial   Date Value Ref Range Status   02/21/2017 4.7 (H) -2.0 - 2.0 mEq/L Final     O2 Sat, Arterial   Date Value Ref Range Status   02/21/2017 99.1 95.0 - 100.0 % Final       Urinalysis    Recent Labs  Lab 02/19/17  1739   Urine Type Catheterized, I   Color, UA Yellow  Clarity, UA Hazy   Specific Gravity UA 1.020   Urine pH 5.0   Nitrite, UA Negative   Ketones UA Negative   Urobilinogen, UA Negative   Bilirubin, UA Negative   Blood, UA Negative   RBC, UA 0 - 2   WBC, UA 0 - 5         Rads:   Xr Chest Ap Portable    Result Date: 02/21/2017   Stable bilateral pleural effusion. Lorinda Creed, MD 02/21/2017 8:53 AM      Gean Quint, MD  02/21/2017  2:43 PM

## 2017-02-21 NOTE — Plan of Care (Signed)
Problem: Compromised Tissue integrity  Goal: Damaged tissue is healing and protected  Outcome: Progressing      Problem: Inadequate Gas Exchange  Goal: Adequate oxygenation and improved ventilation  Outcome: Progressing      Comments: Patient oriented to person, mouth words and follows commands. HR on cardiac monitor in controlled A-Fibrillation rate, lab values monitored see chart. TF progressing, nil residuals noted, BM x 1, incontinent care done, BG done see chart. Skin care done, Q2H repositioning, will continue with plan of care.

## 2017-02-22 ENCOUNTER — Inpatient Hospital Stay: Payer: Medicare Other

## 2017-02-22 DIAGNOSIS — L89154 Pressure ulcer of sacral region, stage 4: Secondary | ICD-10-CM

## 2017-02-22 DIAGNOSIS — J9620 Acute and chronic respiratory failure, unspecified whether with hypoxia or hypercapnia: Secondary | ICD-10-CM

## 2017-02-22 DIAGNOSIS — G8250 Quadriplegia, unspecified: Secondary | ICD-10-CM

## 2017-02-22 DIAGNOSIS — E119 Type 2 diabetes mellitus without complications: Secondary | ICD-10-CM

## 2017-02-22 DIAGNOSIS — I5043 Acute on chronic combined systolic (congestive) and diastolic (congestive) heart failure: Secondary | ICD-10-CM

## 2017-02-22 DIAGNOSIS — J441 Chronic obstructive pulmonary disease with (acute) exacerbation: Secondary | ICD-10-CM

## 2017-02-22 LAB — CBC AND DIFFERENTIAL
Absolute NRBC: 0 10*3/uL
Basophils Absolute Automated: 0.05 10*3/uL (ref 0.00–0.20)
Basophils Automated: 0.4 %
Eosinophils Absolute Automated: 0.52 10*3/uL (ref 0.00–0.70)
Eosinophils Automated: 4 %
Hematocrit: 31.9 % — ABNORMAL LOW (ref 42.0–52.0)
Hgb: 10 g/dL — ABNORMAL LOW (ref 13.0–17.0)
Immature Granulocytes Absolute: 0.12 10*3/uL — ABNORMAL HIGH
Immature Granulocytes: 0.9 %
Lymphocytes Absolute Automated: 1.08 10*3/uL (ref 0.50–4.40)
Lymphocytes Automated: 8.4 %
MCH: 26.7 pg — ABNORMAL LOW (ref 28.0–32.0)
MCHC: 31.3 g/dL — ABNORMAL LOW (ref 32.0–36.0)
MCV: 85.1 fL (ref 80.0–100.0)
MPV: 10.9 fL (ref 9.4–12.3)
Monocytes Absolute Automated: 1.15 10*3/uL (ref 0.00–1.20)
Monocytes: 8.9 %
Neutrophils Absolute: 10 10*3/uL — ABNORMAL HIGH (ref 1.80–8.10)
Neutrophils: 77.4 %
Nucleated RBC: 0 /100 WBC (ref 0.0–1.0)
Platelets: 248 10*3/uL (ref 140–400)
RBC: 3.75 10*6/uL — ABNORMAL LOW (ref 4.70–6.00)
RDW: 18 % — ABNORMAL HIGH (ref 12–15)
WBC: 12.92 10*3/uL — ABNORMAL HIGH (ref 3.50–10.80)

## 2017-02-22 LAB — GLUCOSE WHOLE BLOOD - POCT
Whole Blood Glucose POCT: 103 mg/dL — ABNORMAL HIGH (ref 70–100)
Whole Blood Glucose POCT: 100 mg/dL (ref 70–100)
Whole Blood Glucose POCT: 113 mg/dL — ABNORMAL HIGH (ref 70–100)

## 2017-02-22 MED ORDER — QUETIAPINE FUMARATE 25 MG PO TABS
25.0000 mg | ORAL_TABLET | Freq: Two times a day (BID) | ORAL | 0 refills | Status: AC
Start: 2017-02-22 — End: ?

## 2017-02-22 MED ORDER — QUETIAPINE FUMARATE 25 MG PO TABS
25.0000 mg | ORAL_TABLET | Freq: Two times a day (BID) | ORAL | 0 refills | Status: DC
Start: 2017-02-22 — End: 2017-02-22

## 2017-02-22 MED ORDER — NYSTATIN 100000 UNIT/ML MT SUSP
400000.0000 [IU] | Freq: Four times a day (QID) | OROMUCOSAL | 0 refills | Status: AC
Start: 2017-02-22 — End: ?

## 2017-02-22 NOTE — Progress Notes (Signed)
Report given to Pharmacologist from [facility].

## 2017-02-22 NOTE — Plan of Care (Signed)
Problem: Safety  Goal: Patient will be free from injury during hospitalization  Outcome: Progressing     Problem: Compromised Hemodynamic Status  Goal: Vital signs and fluid balance maintained/improved  Outcome: Progressing

## 2017-02-22 NOTE — Progress Notes (Signed)
Nutritional Support Services  Nutrition Follow-up    [name] 83 y.o. male   MRN: [id number]    Summary of Nutrition Recommendations:  1. Continue TF regimen of Jevity 1.5 via PEG @ goal rate of 60 mls/hr.               Provides 2160 total kcals, 92 gm protein, and 1094 mls free water in 1440 total mls.      2. Provide Prosource TID (3 packets daily)              Each Prosource provides 15 gm protein.                EN (at goal rate) + Prosource provides 2340 kcals, 137 gm protein, and 1094 mls free water in 1440 total mls.              Meets 100% estimated kcal and protein needs.    3. Flush tube with minimum of 30-50 ml Q 4 hours for tube patency- additional fluids for hydration per MD: noted fluid flushes of 100 mls Q 4 hrs ordered by NP [name], will maintain     4. Monitor blood glucose levels (goal: 70-180 mg/dL) and treat with medication changes as needed    5. Monitor daily weights - ordered    Recommendations discussed with RN.  -----------------------------------------------------------------------------------------------------------------                                                     ASSESSMENT DATA     Subjective Nutrition: F/U to RDN assessment 3/13. RDN met with pt in pt's room. Pt sleeping during time of RDN visit. Noted Jevity 1.5 infusing @ 60 mls/hr (goal rate). Spoke with RN, states pt tolerating TF well at goal rate with no signs of intolerance.     Events of Current Admission:  Pt with chronic respiratory failure with tracheostomy to ventilatior 2/2 prior fracture with quadriplegia, PNA, leukocytosis    Medical Hx:  has a past medical history of C2 cervical fracture; Chronic pain; Conjunctivitis; Constipation; Cutaneous abscess of buttock; Dysphagia; Hypertension; Insomnia; Kidney disorder; and Respiratory arrest.     Enteral: Past 24 hrs per pump history: 1335 mls Jevity 1.5, 500 mls free water (92.7% goal volume)    ANTHROPOMETRIC  Anthropometrics  Height: 185.4 cm (6'  0.99")  Weight: 95.9 kg (211 lb 6.7 oz)  Weight Change: 2.02  IBW/kg (Calculated) Male: 83.65 kg  IBW/kg (Calculated) Male: 74.95 kg  BMI (calculated): 27.4    Weight Monitoring 01/08/2017 01/09/2017 01/10/2017 01/12/2017 01/19/2017 02/19/2017 02/20/2017   Height 185.4 cm 185.4 cm - - - 185.4 cm -   Height Method Estimated (No Data) - - - (No Data) -   Weight 97.75 kg 101.2 kg 100.699 kg 98.7 kg 103.874 kg 94 kg 95.9 kg   Weight Method - Bed Scale - Bed Scale Bed Scale Bed Scale Bed Scale   BMI (calculated) 28.5 kg/m2 29.5 kg/m2 - - - 27.4 kg/m2 -     Weight History Summary: weight loss of 9.4% x past 1 month suggested, per RN flowsheet    Physical Assessment:   Unable to perform nutrition focused physical examination at this time due to pt sleeping, will attempt at next RDN visit.  Edema: none, per RN flowsheet  Skin: not notable, per RN  flowsheet  GI function: + BM 3/15, per RN flowsheet    ESTIMATED NEEDS  Estimated Energy Needs  Total Energy Estimated Needs: 1880-2350 kcals/day  Method for Estimating Needs: 20-25 kcals/kg ABW of 94kg (BMI 27, tele pt)    Estimated Protein Needs  Total Protein Estimated Needs: 131-169 gm/day  Method for Estimating Needs: 1.4-1.8 gm/kg ABW of 94kg (BMI 27, tele pt, previous documentation of stage 2 wound)    Fluid Needs  Method for Estimating Needs: 1 ml/kcal or per MD    Pertinent Medications: Reviewed; lipitor, vitamin C  IVF:    . sodium chloride 60 mL/hr at 02/22/17 0600     Pertinent labs: Reviewed; Glucose 100-116 x past 24 hrs    Learning Needs:  None at this time                                                     NUTRITION DIAGNOSIS     Inadequate energy intake related to chronic respiratory failure as evidenced by pt with TF not yet at goal volume. (Resolved)    Unintentional weight loss related to ?unknown etiology ad evidenced by pt with possible weight loss of 9.4% weight loss x past 1 month. (Ongoing)                                                           INTERVENTION      Nutrition recommendation -   1. Continue TF regimen of Jevity 1.5 via PEG @ goal rate of 60 mls/hr.               Provides 2160 total kcals, 92 gm protein, and 1094 mls free water in 1440 total mls.      2. Provide Prosource TID (3 packets daily)              Each Prosource provides 15 gm protein.                EN (at goal rate) + Prosource provides 2340 kcals, 137 gm protein, and 1094 mls free water in 1440 total mls.              Meets 100% estimated kcal and protein needs.    3. Flush tube with minimum of 30-50 ml Q 4 hours for tube patency- additional fluids for hydration per MD: noted fluid flushes of 100 mls Q 4 hrs ordered by NP [name], will maintain     4. Monitor blood glucose levels (goal: 70-180 mg/dL) and treat with medication changes as needed    5. Monitor daily weights - ordered    Goal: Pt will maintain weight during admission (Ongoing)   Goal: Pt will toelrate >80% goal volume with blood glucose levels and electrolytes WNL (Ongoing)                                                         MONITORING     Monitor TF infusion and tolerance, labs,  GI function, and weight.                                                      EVALUATION     Nutrition Risk Level: Moderate (will follow up within 7 days and PRN)     [name], RDN   Ext. [phone]

## 2017-02-22 NOTE — Progress Notes (Signed)
Infectious Diseases & Tropical Medicine  Progress Note    02/22/2017   [name] VQQ:[id number] is a 83 y.o. male,       Assessment:      Chronic respiratory failure-Tracheostomy in place.   Chest x-ray -unchanged (02/22/2017)    Sputum culture-multiple organisms including Pseudomonas and Providencia stuartii (possibility of colonization)   Pro-calcitonin level- <0.1 (02/20/2017)   Atrial fibrillation    No clear evidence of an infection.   Blood cultures no growth to date   Recently treated pneumonia, urinary tract infection, lactobacillus bacteremia.   Quadriplegia.   Leukocytosis improving    Plan:      Remains a stable without antibiotics   Follow-up chest x-ray   Continue probiotics   Monitor electrolytes and renal functions closely   Monitor clinically            .   Discussed with Dr.Anbessie    ROS:     General:  no fever, no chills, no rigor,sleepy but arousable  HEENT: no neck pain, no throat pain,tracheostomy in place  Endocrine:  no fatigue, no night sweats  Respiratory: no cough, shortness of breath, or wheezing   Cardiovascular: no chest pain   Gastrointestinal: no abdominal pain,no N/V/D  Genito-Urinary: no dysuria or hematuria  Musculoskeletal: no edema  Neurological: Quadriplegia, awake and alert  Dermatological: No rash    Physical Examination:     Blood pressure 113/53, pulse 70, temperature 98.7 F (37.1 C), temperature source Axillary, resp. rate 17, height 1.854 m (6' 0.99"), weight 95.9 kg (211 lb 6.7 oz), SpO2 100 %.     General Appearance: Comfortable, and in no acute distress.   HEENT: Pupils are equal, round, and reactive to light.    Lungs:  Decreased breath sounds   Heart:  Regular rate and rhythm   Chest: Symmetric chest wall expansion.    Abdomen: soft ,non tender,no hepatosplenomegaly,good bowel sounds, G-tube in place   Neurological: Sleepy   Extremities: No edema    Laboratory And Diagnostic Studies:     Recent Labs      02/22/17   0439   02/21/17   0249   WBC  12.92*  15.97*   Hgb  10.0*  10.9*   Hematocrit  31.9*  35.1*   Platelets  248  308     Recent Labs      02/21/17   0249  02/20/17   0411   Sodium  136  134*   Potassium  4.3  4.1   Chloride  101  99*   CO2  24  25   BUN  26.0  31.0*   Creatinine  0.7  0.7   Glucose  100  109*   Calcium  8.3  9.0     No results for input(s): AST, ALT, ALKPHOS, PROT, ALB in the last 72 hours.    Current Meds:      Scheduled Meds: PRN Meds:        amiodarone 200 mg per G tube Daily   atorvastatin 20 mg per G tube QHS   balsam peru-castor oil (VENELEX)  Topical Q12H   famotidine 20 mg per G tube QHS   metoclopramide 5 mg per G tube Q8H [facility]   nystatin 400,000 Units Oral QID   QUEtiapine 25 mg per G tube BID   rivaroxaban 20 mg per G tube Daily with dinner   scopolamine 1 patch Transdermal Q72H   simethicone 80 mg per G tube [facility]  vitamin C 500 mg per G tube BID       Continuous Infusions:  . sodium chloride 60 mL/hr at 02/22/17 0600      acetaminophen 650 mg Q6H PRN   albuterol-ipratropium 3 mL Q4H PRN   balsam peru-castor oil (VENELEX)  PRN   bisacodyl 10 mg QD PRN   senna 17.2 mg QD PRN         [name], M.D.  02/22/2017  9:11 AM

## 2017-02-22 NOTE — Progress Notes (Signed)
PULM. CCM PROGRESS NOTE    Date Time: 02/22/17 1:22 PM  Patient Name: [name],[name]      Assessment:     .  Chronic respiratory failure  .  Quadriplegia  .  Polymicrobial colonization of airway   .  Chronic atrial fibrillation  .  Hyperlipidemia.  .  Sacral decubitus  .  Encephalopathy    Plan:     .  Ventilator dependent   .  Bedridden.  .  No further antibiotics needed.  .  Controlled ventricular rate, on amiodarone.  Also on anticoagulation  .  Continue statin.  .  Local wound care  .  Metabolic encephalopathy has improved, patient is awake, alert, open eyes to commands     Subjective:     Patient is a 83 y.o. male who On ventilator, open eyes to vocal commands     Medications:     Current Facility-Administered Medications   Medication Dose Route Frequency   . amiodarone  200 mg per G tube Daily   . atorvastatin  20 mg per G tube QHS   . balsam peru-castor oil (VENELEX)   Topical Q12H   . famotidine  20 mg per G tube QHS   . metoclopramide  5 mg per G tube [facility]   . nystatin  400,000 Units Oral QID   . QUEtiapine  25 mg per G tube BID   . rivaroxaban  20 mg per G tube Daily with dinner   . scopolamine  1 patch Transdermal Q72H   . simethicone  80 mg per G tube [facility]   . vitamin C  500 mg per G tube BID       Review of Systems:     Detailed review of systems cannot be obtained    Physical Exam:   BP 105/54   Pulse 81   Temp 98.8 F (37.1 C) (Oral)   Resp 15   Ht 1.854 m (6' 0.99")   Wt 95.9 kg (211 lb 6.7 oz)   SpO2 98%   BMI 27.90 kg/m     Intake and Output Summary (Last 24 hours) at Date Time    Intake/Output Summary (Last 24 hours) at 02/22/17 1322  Last data filed at 02/22/17 0600   Gross per 24 hour   Intake             1530 ml   Output                0 ml   Net             1530 ml       General appearance - On ventilator, seems comfortable, chronically ill-appearing   Mental status - Open eyes to local commands   Eyes - pupils equal and reactive, extraocular eye movements intact  Ears - no  external ear lesions seen  Nose - no nasal discharge   Mouth - clear oral mucosa, moist   Neck - supple, no significant adenopathy  Chest - clear to auscultation, no wheezes, rales or rhonchi, symmetric air entry  Heart - normal rate, irregular rhythm, normal S1, S2, no rubs, clicks or gallops  Abdomen - soft, nontender, nondistended, no masses or organomegaly  Neurological - on ventilator, no movement disorder noted  Extremities -   plus  pedal edema, no clubbing or cyanosis  Skin -  sacral decubitus     Labs:     Recent Labs  Lab 02/22/17  1610 02/21/17  0249 02/20/17  0411   WBC 12.92* 15.97* 14.44*   Hgb 10.0* 10.9* 11.3*   Hematocrit 31.9* 35.1* 36.1*   Platelets 248 308 305   MCV 85.1 85.4 83.2   Neutrophils 77.4 75.5 76.7       Recent Labs  Lab 02/21/17  0249 02/20/17  0411 02/19/17  0528 02/18/17  2233   Sodium 136 134* 136 137   Potassium 4.3 4.1 4.2 4.2   Chloride 101 99* 98* 96*   CO2 24 25 25 29    BUN 26.0 31.0* 33.0* 35.0*   Creatinine 0.7 0.7 0.8 0.8   Glucose 100 109* 91 92   Calcium 8.3 9.0 9.7 10.3*   Protein, Total  --   --   --  7.2   Albumin  --   --   --  3.2*   AST (SGOT)  --   --   --  16   ALT  --   --   --  22   Alkaline Phosphatase  --   --   --  81   Bilirubin, Total  --   --   --  0.5     Glucose:    Recent Labs  Lab 02/21/17  0249 02/20/17  0411 02/19/17  0528 02/18/17  2233 02/16/17  1000   Glucose 100 109* 91 92 76           Rads:   Radiological Procedure reviewed.  Radiology Results (24 Hour)     Procedure Component Value Units Date/Time    XR Chest AP Portable [[id number]] Collected:  02/22/17 0752    Order Status:  Completed Updated:  02/22/17 0758    Narrative:       INDICATION: pna    TECHNIQUE: Portable chest performed on 02/22/2017 at 5:10 AM.    COMPARISON: Multiple exams with the latest dated the day before      Impression:       FINDINGS/    Limited examination due to significant right rotation.    Otherwise, no significant change from most recent examination with  stable mild  bibasilar density, bilateral pleural effusion, and  cardiomegaly. No definite pneumothorax. Grossly stable tracheotomy tube.                          Max Fickle, MD   02/22/2017 7:54 AM            Lattie Haw, MD  Pulmonary and critical care  02/22/17

## 2017-02-22 NOTE — Plan of Care (Signed)
Problem: Inadequate Gas Exchange  Goal: Patent Airway maintained   02/22/17 1456   Goal/Interventions addressed this shift   Patent airway maintained  Position patient for maximum ventilatory efficiency;Suction secretions as needed;Reinforce use of ordered respiratory interventions (i.e. CPAP, BiPAP, Incentive Spirometer, Acapella, etc.);Reposition patient every 2 hours and as needed unless able to self-reposition;Provide adequate fluid intake to liquefy secretions   Requires frequent suctioning. Moderate thick secretions.

## 2017-02-22 NOTE — Progress Notes (Addendum)
Dr. [name] answering service made aware that patient SpO2 81%. Patient suctioned on F102 100% and now Spo2 100%.

## 2017-02-22 NOTE — Discharge Summary (Addendum)
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 male who was recently discharged on 01/30/17 after presenting with   Metabolic encephalopathy, pneumonia and UTI who presented to the Emergency Room with altered   Mental status that was noticed by nursing staff. 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 He is lethargic and could not give any history. He had workup here in the ER  And found to have leukocytosis. He was also found to be in atrial fibrillation with rapid ventricular   Response. He was started on Cardizem and his rate is better this morning. He will be admitted for   Further workup of altered mental status .    He was admitted and multi speciality consult done with ID and pulmonary unit. Patient was not started on antibiotics as he was recently treated for UTI and pneumonia. Culture from urine and sputum grew organisms but assumed to be contaminant. procalcitonin is negative and CXR is unchanged. No organism on blood culture. Leucocytosis is trending down and patient is afebrile. He is stable to be transferred to [facility] . Vent set is adjusted by pulmonary unit.          Laboratory Data      CBC    Recent Labs  Lab 02/22/17  0439 02/21/17  0249 02/20/17  0411 02/19/17  0528 02/18/17  2233   WBC 12.92* 15.97* 14.44* 15.13* 19.28*   Hgb 10.0* 10.9* 11.3* 12.8* 12.2*   Hematocrit 31.9* 35.1* 36.1* 39.3* 38.5*   Platelets 248 308 305 285 374   MCV 85.1 85.4 83.2 83.3 82.6   Neutrophils 77.4 75.5 76.7 77.5 83.0       CMP    Recent Labs  Lab 02/21/17  0249 02/20/17  0411 02/19/17  0528 02/18/17  2233 02/16/17  1000   Sodium 136 134* 136 137 136   Potassium 4.3 4.1 4.2 4.2 4.3   Chloride 101 99* 98* 96* 96*   CO2 24 25 25 29 26    BUN 26.0 31.0* 33.0* 35.0* 34.0*   Creatinine 0.7 0.7 0.8 0.8 0.7   Glucose 100 109* 91 92 76   Calcium 8.3 9.0 9.7 10.3* 9.1   Protein, Total  --   --   --  7.2  --    Albumin  --   --   --  3.2*  --    AST (SGOT)  --   --   --  16  --    ALT  --   --   --  22  --  Alkaline Phosphatase  --   --   --  81  --    Bilirubin, Total  --   --   --  0.5  --        Lipid panel  No results found for: CHOL, TRIG, HDL, LDL          Lab Results   Component Value Date    TSH 3.95 12/17/2016       Cardiac enzymes    Recent Labs  Lab 02/18/17  2233   Troponin I <0.01       Culture:   Microbiology Results     Procedure Component Value Units Date/Time    Blood Culture Aerobic/Anaerobic #1 [[id number]] Collected:  02/18/17 2233    Specimen:  Arm from Blood, Venipuncture Updated:  02/22/17 0221    Narrative:       ORDER#: [id number]                                    ORDERED BY: NITZBERG, MICHA  SOURCE: Blood, Venipuncture arm                      COLLECTED:  02/18/17 22:33  ANTIBIOTICS AT COLL.:                                RECEIVED :  02/19/17 01:44  Culture Blood Aerobic and Anaerobic        PRELIM      02/22/17 02:21  02/20/17   No Growth after 1 day/s of incubation.  02/21/17   No Growth after 2 day/s of incubation.  02/22/17   No Growth after 3 day/s of incubation.      Blood Culture Aerobic/Anaerobic #2 [[id number]] Collected:  02/18/17 2233    Specimen:  Arm from Blood, Venipuncture  Updated:  02/22/17 0221    Narrative:       ORDER#: [id number]                                    ORDERED BY: NITZBERG, MICHA  SOURCE: Blood, Venipuncture arm                      COLLECTED:  02/18/17 22:33  ANTIBIOTICS AT COLL.:                                RECEIVED :  02/19/17 01:44  Culture Blood Aerobic and Anaerobic        PRELIM      02/22/17 02:21  02/20/17   No Growth after 1 day/s of incubation.  02/21/17   No Growth after 2 day/s of incubation.  02/22/17   No Growth after 3 day/s of incubation.      CULTURE + Dierdre Forth [[id number]] Collected:  02/19/17 1739    Specimen:  Sputum from Sputum, Suctioned Updated:  02/21/17 1919    Narrative:       ORDER#: [id number]                                    ORDERED BY: Nicholes Calamity  SOURCE: Sputum, Suctioned ETT  COLLECTED:  02/19/17 17:39  ANTIBIOTICS AT COLL.:                                RECEIVED :  02/19/17 22:07  Stain, Gram (Respiratory)                  FINAL       02/20/17 00:07  02/20/17   Few WBC's             Few Squamous epithelial cells             Few Mixed Respiratory Flora  Culture and Gram Stain, Aerobic, RespiratorPRELIM      02/21/17 19:19   +  02/20/17   Moderate growth of mixed upper respiratory flora  02/21/17   Moderate growth of Pseudomonas aeruginosa               Strain 1             Susceptibility to follow    02/21/17   Moderate growth of Pseudomonas aeruginosa               Strain 2             Susceptibility to follow    02/21/17   Moderate growth of Providencia stuartii               Susceptibility to follow        MRSA Culture [[id number]] Collected:  02/18/17 2233    Specimen:  Body Fluid from Nasal/Throat ASC Admission Updated:  02/20/17 0033    Narrative:       ORDER#: [id number]                                    ORDERED BY: [name], MICHA  SOURCE: Nares and Throat                             COLLECTED:  02/18/17 22:33  ANTIBIOTICS AT COLL.:                                RECEIVED :   02/19/17 01:56  Culture MRSA Surveillance                  FINAL       02/20/17 00:29  02/20/17   Negative for Methicillin Resistant Staph aureus from Nares and             Negative for Methicillin Resistant Staph aureus from Throat      Rapid influenza A/B antigens (Flu) [[id number]] Collected:  02/19/17 0037    Specimen:  Nasopharyngeal from Nasal Aspirate Updated:  02/19/17 0108    Narrative:       ORDER#: [id number]                                    ORDERED BY: Meryl Crutch, LEON  SOURCE: Nasal Aspirate                               COLLECTED:  02/19/17 00:37  ANTIBIOTICS AT COLL.:  RECEIVED :  02/19/17 00:43  Influenza Rapid Antigen A&B                FINAL       02/19/17 01:05  02/19/17   Negative for Influenza A and B             Reference Range: Negative               All radiology result for current encounter  Ct Head Wo Contrast    Result Date: 02/18/2017    No acute intracranial abnormality. Wyatt Portela, MD 02/18/2017 10:15 PM    Ct Chest Wo Contrast    Result Date: 02/19/2017  1. Trace air within the anterior right pleural space suspicious for a trace pneumothorax. Air may have been introduced iatrogenically or due to a ruptured bleb and clinical correlation is recommended. Short-term follow up CT of the chest or chest radiograph may be performed. 2. Small bilateral pleural effusions. The findings were discussed and confirmed with nurse  [name] on 02/19/2017 at 5:50 PM.  Fonnie Mu, MD 02/19/2017 5:54 PM    Ct Abdomen Pelvis W Iv Contrast Only    Result Date: 01/26/2017  1. Improving small bowel obstruction. 2. Inflammatory changes adjacent to the second portion of the duodenum favoring duodenitis. 3. Bilateral pleural effusions and bibasilar atelectasis. Right middle lobe infiltrate is not excluded. 4. Cholelithiasis. 5. Right nephrolithiasis. 6. Additional chronic findings as above. Jorene Guest, MD 01/26/2017 12:16 AM    Xr Chest Ap Portable    Result Date: 02/22/2017  FINDINGS/  Limited examination due to significant right rotation. Otherwise, no significant change from most recent examination with stable mild bibasilar density, bilateral pleural effusion, and cardiomegaly. No definite pneumothorax. Grossly stable tracheotomy tube.       Max Fickle, MD 02/22/2017 7:54 AM    Xr Chest Ap Portable    Result Date: 02/21/2017   Stable bilateral pleural effusion. Lorinda Creed, MD 02/21/2017 8:53 AM    Xr Chest Ap Portable    Result Date: 02/20/2017   Mild interval worsening of pulmonary vascular congestion. Sandie Ano, MD 02/20/2017 7:15 AM    Xr Chest  Ap Portable    Result Date: 02/18/2017    Stable probable small bilateral pleural effusions and bibasilar airspace disease. Wyatt Portela, MD 02/18/2017 9:02 PM    Xr Chest Ap Portable    Result Date: 01/24/2017   Stable chest Gaynelle Arabian, MD 01/24/2017 11:07 AM    Xr Abdomen Portable    Result Date: 01/24/2017   Multiple dilated small bowel loops consistent with mechanical obstruction or ileus. Gustavus Messing, MD 01/24/2017 5:10 PM       Discharge Medications:     Current Discharge Medication List      START taking these medications    Details   nystatin (MYCOSTATIN) 100000 UNIT/ML suspension Take 4 mLs (400,000 Units total) by mouth 4 (four) times daily.  Qty: 60 mL, Refills: 0         CONTINUE these medications which have CHANGED    Details   !! QUEtiapine (SEROQUEL) 25 MG tablet 1 tablet (25 mg total) by per G tube route 2 (two) times daily.  Qty: 60 tablet, Refills: 0       !! - Potential duplicate medications found. Please discuss with provider.      CONTINUE these medications which have NOT CHANGED    Details   acetaminophen (TYLENOL) 160  MG/5ML suspension Give 20.3 mL via gastric tube every 6 hours as needed      albuterol-ipratropium (DUO-NEB) 2.5-0.5(3) mg/3 mL nebulizer Take 3 mLs by nebulization 4 (four) times daily.In addition to every 4 hours as needed      amiodarone (PACERONE) 200 MG tablet Place 1 tablet (200 mg total) into  feeding tube daily.      amLODIPine (NORVASC) 5 MG tablet TAKE ONE TABLET BY gastric tube EVERY DAY      Ascorbic Acid (VITAMIN C) 500 MG tablet 500 mg by per G tube route 2 (two) times daily.          atorvastatin (LIPITOR) 20 MG tablet 20 mg by per G tube route nightly.          bumetanide (BUMEX) 0.5 MG tablet 0.5 mg by per G tube route daily.      cefTRIAXone (ROCEPHIN) 1 g injection Inject 1 g into the muscle daily.      famotidine (PEPCID) 20 MG tablet 20 mg by per G tube route nightly.          furosemide (LASIX) 40 MG tablet 40 mg by per G tube route daily.      glycopyrrolate (ROBINUL) 1 MG tablet 1 mg by per G tube route daily.      metoclopramide (REGLAN) 5 MG tablet 5 mg by per G tube route every 8 (eight) hours.      metoprolol tartrate (LOPRESSOR) 25 MG tablet 25 mg by per G tube route 2 (two) times daily.      Multiple Vitamin (TAB-A-VITE) Tab 1 tablet daily.Via g tube      potassium chloride 20 MEQ/15ML (10%) oral solution 10 mEq by per G tube route daily.      !! QUEtiapine (SEROQUEL) 25 MG tablet 25 mg by per G tube route 2 (two) times daily.      rivaroxaban (XARELTO) 20 MG Tab 20 mg by per G tube route daily with dinner.      scopolamine (TRANSDERM-SCOP) 1.5 mg Place 1 patch onto the skin every third day.      senna (SENOKOT) 8.6 MG tablet 2 tablets by per G tube route daily as needed for Constipation.      simethicone (MYLICON) 80 MG chewable tablet 1 tablet (80 mg total) by per G tube route every 8 (eight) hours.       !! - Potential duplicate medications found. Please discuss with provider.            Discharge Instructions:      Follow-up with Institut[facility] MD        Hershal Coria, MD  [id number]  [facility]  [phone]  02/22/2017  12:15 PM

## 2017-02-22 NOTE — Progress Notes (Signed)
02/22/17         02/22/17 1325   Discharge Disposition   Patient preference/choice provided? Yes   Physical Discharge Disposition LTAC   Receiving facility, unit and room number: [facility], room ICU 5   Nursing report phone number: [phone]   Mode of Transportation Ambulance   Pick up time 3pm   Patient/Family/POA notified of transfer plan Yes  (son)   Patient agreeable to discharge plan/expected d/c date? Yes   Family/POA agreeable to discharge plan/expected d/c date? Yes   Bedside nurse notified of transport plan? Yes   Hard copy of narcotic RX sent with patient? N/A   Hard copy of DNR/Advance Directive sent with patient? N/A   IV antibiotics post discharge? N/A   Wound care post discharge? Yes   CM Interventions   Follow up appointment scheduled? No   Reason no follow up scheduled? Other (comment)  (d/c to [facility]   Referral made for home health RN visit? No, Other (comment)  (d/c to [facility])   Multidisciplinary rounds/family meeting before d/c? Yes   Medicare Checklist   Is this a Medicare patient? Yes       Garry Heater, RN MSN  Clinical Care Manager  [phone]

## 2017-02-22 NOTE — Progress Notes (Signed)
Pt was accepted at [facility]. Ambulance was  rescheduled for 7 pm.    Garry Heater, RN MSN  Clinical Care Manager  [phone]

## 2017-02-22 NOTE — Progress Notes (Signed)
Transport and transfer on hold. [facility] needs to clarify some issues  related to the secondary insurance.     Garry Heater, RN MSN  Clinical Care Manager  [phone]

## 2017-02-22 NOTE — Plan of Care (Signed)
Comments:

## 2017-02-23 ENCOUNTER — Encounter (INDEPENDENT_AMBULATORY_CARE_PROVIDER_SITE_OTHER): Payer: Self-pay

## 2017-02-23 ENCOUNTER — Telehealth (INDEPENDENT_AMBULATORY_CARE_PROVIDER_SITE_OTHER): Payer: Self-pay

## 2017-02-23 DIAGNOSIS — I509 Heart failure, unspecified: Secondary | ICD-10-CM

## 2017-02-23 DIAGNOSIS — J9600 Acute respiratory failure, unspecified whether with hypoxia or hypercapnia: Secondary | ICD-10-CM

## 2017-02-23 DIAGNOSIS — E119 Type 2 diabetes mellitus without complications: Secondary | ICD-10-CM

## 2017-02-23 DIAGNOSIS — J441 Chronic obstructive pulmonary disease with (acute) exacerbation: Secondary | ICD-10-CM

## 2017-02-23 DIAGNOSIS — I639 Cerebral infarction, unspecified: Secondary | ICD-10-CM

## 2017-02-23 DIAGNOSIS — G8250 Quadriplegia, unspecified: Secondary | ICD-10-CM

## 2017-02-23 DIAGNOSIS — L89152 Pressure ulcer of sacral region, stage 2: Secondary | ICD-10-CM

## 2017-02-23 NOTE — Progress Notes (Signed)
TCM Medicare Focus Diagnosis Navigator  Initial Call    Enrollment    Call participant, relationship to patient and contact information: Binney from [place] [facility] @[phone]    HIPAA verification completed (verify patient's DOB) (Y/N): Yes     Primary contact at facility for follow-up calls: Binney     Name and number of TCM Medicare Navigator provided (Y/N): Yes     Informed of role of TCM Medicare Navigator and duration of services provided (Y/N): Yes       Patient Information    Hospital Admission Date: 02/18/2017    Hospital Discharge Date: 02/22/2017    Index Admission Diagnosis: PNA    Hospital Discharge Disposition: SNF     Agencies/Facility Involved in Care: [facility]      Physician Involvement    Physician involved in care at facility (facility medical director/physician, community PCP): Dr. Haskell Flirt    Physician Name and Number: Dr. Haskell Flirt    If community PCP--office notified telephonically and in writing of patient's hospitalization, current level of care, and Navigator involvement (Y/N):   Yes       Teach Back    For Service Provider/Facility:      Informed facility staff of "[place] [facility] 90 Day Commitment to Care" Document and sent document to facility (Y/N): Yes     Was facility notified that Navigator or ED should be called PRIOR to any return to the hospital in an effort to see if other interventions could be put in place to prevent a readmission (Y/N): Yes    Medications    For Facility:      Did patient/facility obtain all medications after hospital discharge? (Y/N): Yes     Was a medication reconciliation completed after discharge by the facility? (Y/N): Yes     Are there concerns or questions about patient's medications? (Y/N): No       Follow Up    Follow up needs: No     Follow up plan: Navigator will continue to f/u with pt      Comments    Navigator made outreach to [facility] @[phone] and spoke with pt's nurse Audelia Acton, Nurse reported pt is doing well, pt is currently on a vent, pt is  taking his anti biotics, pt has no needs or concerns at the moment.    Navigator will continue to f/u with pt      Kaedence Connelly BS   TCM Medicare Focus navigator   [phone]

## 2017-02-23 NOTE — Telephone Encounter (Signed)
TCM Medicare Navigator    Navigator attempted to make outreach to pt's pcp Al Khouri, Viviana Simpler, MD @[phone] and was informed pt's doctor was a hospitalist. Doctor had no office and mainly worked in the hospital. As such MFN letter could not be sent.    Navigator will continue to f/u with pt      Gwynne Kemnitz BS   TCM Medicare Focus navigator   [phone]

## 2017-02-25 ENCOUNTER — Encounter (INDEPENDENT_AMBULATORY_CARE_PROVIDER_SITE_OTHER): Payer: Self-pay

## 2017-02-25 NOTE — Progress Notes (Signed)
TCM Medicare Focus Diagnosis Navigator  Follow-Up Call    Call participant, relationship to patient and contact information: Denyse Amass from [place] @[phone]      Patient Information    Agencies/Facility Involved in Care: [facility]     Was facility reminded that Navigator or ED should be called PRIOR to any return to the hospital in an effort to see if other interventions could be put in place to prevent a readmission (Y/N): Yes      Needs    On engagement call #2:  Discussed 90 Day Commitment to Care Documents with facility staff and/or patient/caregiver:  Yes     Follow up needs: No     Barriers or concerns to continued wellness in the community: No     PCP or facility physician notified for any reason: No      Referrals placed: No    Interventions or actions taken: No    Follow up plan: navigator will continue to f/u with pt      Comments    Navigator made outreach to [facility] @[phone] and spoke with pt's nurse Denyse Amass. Nurse reported pt is doing well, pt will be down graded to the tele unit soon, pt is stable, pt currently has no needs or concerns at the moment.    Navigator will continue to f/u with pt      Karry Barrilleaux BS   TCM Medicare Focus navigator   [phone]

## 2017-02-26 ENCOUNTER — Encounter (INDEPENDENT_AMBULATORY_CARE_PROVIDER_SITE_OTHER): Payer: Self-pay

## 2017-02-26 NOTE — Progress Notes (Signed)
TCM Medicare Navigator    Completed chart review, patient coded out of CMS Medicare Focus Diagnosis Penalty Population.  Case will be closed with TCM Medicare Navigator services at this time.      Jayleana Colberg BS   TCM Medicare Focus navigator   [phone]

## 2017-05-10 DEATH — deceased
